# Patient Record
Sex: Female | Born: 1980
Health system: Southern US, Community
[De-identification: ages and names within clinical notes are randomized; demographics above are authoritative.]

## PROBLEM LIST (undated history)

## (undated) DIAGNOSIS — G4700 Insomnia, unspecified: Secondary | ICD-10-CM

## (undated) DIAGNOSIS — R55 Syncope and collapse: Secondary | ICD-10-CM

## (undated) DIAGNOSIS — G43709 Chronic migraine without aura, not intractable, without status migrainosus: Secondary | ICD-10-CM

## (undated) DIAGNOSIS — K649 Unspecified hemorrhoids: Secondary | ICD-10-CM

## (undated) DIAGNOSIS — R519 Headache, unspecified: Secondary | ICD-10-CM

## (undated) DIAGNOSIS — Z87410 Personal history of cervical dysplasia: Secondary | ICD-10-CM

## (undated) DIAGNOSIS — G4730 Sleep apnea, unspecified: Secondary | ICD-10-CM

## (undated) DIAGNOSIS — R000 Tachycardia, unspecified: Secondary | ICD-10-CM

## (undated) DIAGNOSIS — I493 Ventricular premature depolarization: Secondary | ICD-10-CM

## (undated) DIAGNOSIS — F411 Generalized anxiety disorder: Secondary | ICD-10-CM

## (undated) DIAGNOSIS — G4731 Primary central sleep apnea: Secondary | ICD-10-CM

## (undated) DIAGNOSIS — M1990 Unspecified osteoarthritis, unspecified site: Secondary | ICD-10-CM

## (undated) DIAGNOSIS — F419 Anxiety disorder, unspecified: Secondary | ICD-10-CM

## (undated) DIAGNOSIS — K219 Gastro-esophageal reflux disease without esophagitis: Secondary | ICD-10-CM

## (undated) DIAGNOSIS — H5500 Unspecified nystagmus: Secondary | ICD-10-CM

## (undated) DIAGNOSIS — I491 Atrial premature depolarization: Secondary | ICD-10-CM

## (undated) DIAGNOSIS — G35 Multiple sclerosis: Secondary | ICD-10-CM

## (undated) DIAGNOSIS — G709 Myoneural disorder, unspecified: Secondary | ICD-10-CM

## (undated) DIAGNOSIS — R51 Headache: Secondary | ICD-10-CM

## (undated) HISTORY — DX: Multiple sclerosis: G35

## (undated) HISTORY — DX: Myoneural disorder, unspecified: G70.9

## (undated) HISTORY — DX: Headache, unspecified: R51.9

## (undated) HISTORY — PX: NO PAST SURGERIES: SHX2092

## (undated) HISTORY — DX: Unspecified nystagmus: H55.00

## (undated) HISTORY — DX: Headache: R51

## (undated) HISTORY — DX: Insomnia, unspecified: G47.00

## (undated) HISTORY — DX: Syncope and collapse: R55

---

## 2001-11-26 ENCOUNTER — Ambulatory Visit (HOSPITAL_COMMUNITY): Admission: RE | Admit: 2001-11-26 | Discharge: 2001-11-26 | Payer: Self-pay | Admitting: Pediatrics

## 2001-11-26 ENCOUNTER — Encounter: Payer: Self-pay | Admitting: General Surgery

## 2004-10-14 ENCOUNTER — Emergency Department (HOSPITAL_COMMUNITY): Admission: EM | Admit: 2004-10-14 | Discharge: 2004-10-14 | Payer: Self-pay | Admitting: Emergency Medicine

## 2005-01-03 ENCOUNTER — Emergency Department (HOSPITAL_COMMUNITY): Admission: EM | Admit: 2005-01-03 | Discharge: 2005-01-03 | Payer: Self-pay | Admitting: Emergency Medicine

## 2006-06-08 ENCOUNTER — Emergency Department (HOSPITAL_COMMUNITY): Admission: EM | Admit: 2006-06-08 | Discharge: 2006-06-08 | Payer: Self-pay | Admitting: Emergency Medicine

## 2006-06-09 ENCOUNTER — Ambulatory Visit (HOSPITAL_COMMUNITY): Admission: RE | Admit: 2006-06-09 | Discharge: 2006-06-09 | Payer: Self-pay | Admitting: Emergency Medicine

## 2007-04-20 ENCOUNTER — Emergency Department (HOSPITAL_COMMUNITY): Admission: EM | Admit: 2007-04-20 | Discharge: 2007-04-20 | Payer: Self-pay | Admitting: Emergency Medicine

## 2008-01-23 ENCOUNTER — Emergency Department (HOSPITAL_COMMUNITY): Admission: EM | Admit: 2008-01-23 | Discharge: 2008-01-23 | Payer: Self-pay | Admitting: Emergency Medicine

## 2008-09-13 ENCOUNTER — Emergency Department (HOSPITAL_COMMUNITY): Admission: EM | Admit: 2008-09-13 | Discharge: 2008-09-13 | Payer: Self-pay | Admitting: Emergency Medicine

## 2008-12-06 ENCOUNTER — Emergency Department (HOSPITAL_COMMUNITY): Admission: EM | Admit: 2008-12-06 | Discharge: 2008-12-06 | Payer: Self-pay | Admitting: Emergency Medicine

## 2011-07-24 LAB — CBC
HCT: 41.4
Hemoglobin: 14.8
MCHC: 35.7
RBC: 4.45
RDW: 12.5

## 2011-07-24 LAB — COMPREHENSIVE METABOLIC PANEL
ALT: 12
Alkaline Phosphatase: 55
BUN: 11
CO2: 27
Calcium: 9
GFR calc non Af Amer: >60
Glucose, Bld: 103 — ABNORMAL HIGH
Sodium: 137

## 2011-07-24 LAB — PREGNANCY, URINE: Preg Test, Ur: NEGATIVE

## 2011-07-24 LAB — DIFFERENTIAL
Basophils Relative: 1
Eosinophils Absolute: 0.1
Lymphs Abs: 2.8
Neutro Abs: 5.3
Neutrophils Relative %: 59

## 2011-08-16 LAB — DIFFERENTIAL
Lymphocytes Relative: 38
Lymphs Abs: 2.7
Monocytes Relative: 8
Neutrophils Relative %: 52

## 2011-08-16 LAB — CBC
Platelets: 322
RBC: 4.33
WBC: 7

## 2011-08-16 LAB — D-DIMER, QUANTITATIVE: D-Dimer, Quant: 0.26

## 2013-01-24 ENCOUNTER — Encounter (HOSPITAL_COMMUNITY): Payer: Self-pay | Admitting: *Deleted

## 2013-01-24 ENCOUNTER — Emergency Department (HOSPITAL_COMMUNITY)
Admission: EM | Admit: 2013-01-24 | Discharge: 2013-01-24 | Disposition: A | Discharge status: Home Health | Payer: Self-pay | Attending: Emergency Medicine | Admitting: Emergency Medicine

## 2013-01-24 DIAGNOSIS — R1032 Left lower quadrant pain: Secondary | ICD-10-CM | POA: Insufficient documentation

## 2013-01-24 DIAGNOSIS — Z3202 Encounter for pregnancy test, result negative: Secondary | ICD-10-CM | POA: Insufficient documentation

## 2013-01-24 DIAGNOSIS — R109 Unspecified abdominal pain: Secondary | ICD-10-CM

## 2013-01-24 DIAGNOSIS — F17200 Nicotine dependence, unspecified, uncomplicated: Secondary | ICD-10-CM | POA: Insufficient documentation

## 2013-01-24 LAB — URINALYSIS, ROUTINE W REFLEX MICROSCOPIC
Leukocytes, UA: NEGATIVE
Nitrite: NEGATIVE
Specific Gravity, Urine: <1.005 — ABNORMAL LOW (ref 1.005–1.030)
pH: 6 (ref 5.0–8.0)

## 2013-01-24 LAB — CBC
MCH: 32.9 pg (ref 26.0–34.0)
Platelets: 276 10*3/uL (ref 150–400)
RBC: 4.65 MIL/uL (ref 3.87–5.11)
WBC: 7.4 10*3/uL (ref 4.0–10.5)

## 2013-01-24 LAB — COMPREHENSIVE METABOLIC PANEL
ALT: 17 U/L (ref 0–35)
AST: 13 U/L (ref 0–37)
CO2: 24 mEq/L (ref 19–32)
Calcium: 9.4 mg/dL (ref 8.4–10.5)
Chloride: 103 mEq/L (ref 96–112)
GFR calc non Af Amer: 76 mL/min — ABNORMAL LOW (ref >90)
Potassium: 4.6 mEq/L (ref 3.5–5.1)
Sodium: 136 mEq/L (ref 135–145)

## 2013-01-24 LAB — POCT PREGNANCY, URINE: Preg Test, Ur: NEGATIVE

## 2013-01-24 MED ORDER — HYDROCODONE-ACETAMINOPHEN 5-500 MG PO TABS: 1.0000 | ORAL_TABLET | Freq: Four times a day (QID) | ORAL | Status: DC | PRN

## 2013-01-24 MED ORDER — HYDROCODONE-ACETAMINOPHEN 5-325 MG PO TABS
1.0000 | ORAL_TABLET | Freq: Once | ORAL | Status: AC
  Administered 2013-01-24: 1 via ORAL
  Filled 2013-01-24: qty 1

## 2013-01-24 NOTE — ED Notes (Signed)
Nausea, light headed for 1 week, Pain LLQ since yesterday, Has taken 2 neg preg tests at home.

## 2013-01-24 NOTE — ED Provider Notes (Signed)
History     CSN: [id number]  Arrival date & time 01/24/13  1306   First MD Initiated Contact with Patient 01/24/13 1333      Chief Complaint  Patient presents with  . Abdominal Pain    (Consider location/radiation/quality/duration/timing/severity/associated sxs/prior treatment) Patient is a 32 y.o. female presenting with abdominal pain. The history is provided by the patient.  Abdominal Pain Associated symptoms: no chest pain, no constipation, no cough, no diarrhea, no dysuria, no fever, no shortness of breath, no vaginal bleeding, no vaginal discharge and no vomiting   pt c/o llq pain in past day, crampy, dull, mild LLQ, non radiating, no specific exacerbating or alleviating factors. Had normal bm today. No nvd. Normal appetite. No fever or chills. No dysuria or gu c/o. No vaginal discharge or bleeding. lnmp 1 month ago. States took 2 preg tests at home neg. No hx endometriosis, ovarian cysts, or uterine fibroids. No prior abd surgery. No abd wall strain. No fever or chills. No back or flank pain.    History reviewed. No pertinent past medical history.  History reviewed. No pertinent past surgical history.  History reviewed. No pertinent family history.  History  Substance Use Topics  . Smoking status: Current Every Day Smoker  . Smokeless tobacco: Not on file  . Alcohol Use: Yes     Comment: rarely    OB History   Grav Para Term Preterm Abortions TAB SAB Ect Mult Living                  Review of Systems  Constitutional: Negative for fever.  HENT: Negative for neck pain.   Eyes: Negative for redness.  Respiratory: Negative for cough and shortness of breath.   Cardiovascular: Negative for chest pain.  Gastrointestinal: Positive for abdominal pain. Negative for vomiting, diarrhea and constipation.  Genitourinary: Negative for dysuria, flank pain, vaginal bleeding and vaginal discharge.  Musculoskeletal: Negative for back pain.  Skin: Negative for rash.   Neurological: Negative for headaches.  Hematological: Does not bruise/bleed easily.  Psychiatric/Behavioral: Negative for confusion.    Allergies  Review of patient's allergies indicates not on file.  Home Medications  No current outpatient prescriptions on file.  BP 128/76  Pulse 86  Temp(Src) 98.6 F (37 C) (Oral)  Resp 20  Ht 5\' 4"  (1.626 m)  Wt 206 lb (93.441 kg)  BMI 35.34 kg/m2  SpO2 98%  LMP 11/29/2012  Physical Exam  Nursing note and vitals reviewed. Constitutional: She appears well-developed and well-nourished. No distress.  HENT:  Mouth/Throat: Oropharynx is clear and moist.  Eyes: Conjunctivae are normal. No scleral icterus.  Neck: Neck supple. No tracheal deviation present.  Cardiovascular: Normal rate, regular rhythm, normal heart sounds and intact distal pulses.   Pulmonary/Chest: Effort normal and breath sounds normal. No respiratory distress.  Abdominal: Soft. Normal appearance and bowel sounds are normal. She exhibits no distension and no mass. There is no tenderness. There is no rebound and no guarding.  No abd/pelvic tenderness w light or deep palpation.   Genitourinary:  No cva tenderness  Musculoskeletal: She exhibits no edema.  Neurological: She is alert.  Skin: Skin is warm and dry. No rash noted.  Psychiatric: She has a normal mood and affect.    ED Course  Procedures (including critical care time)   Results for orders placed during the hospital encounter of 01/24/13  URINALYSIS, ROUTINE W REFLEX MICROSCOPIC      Result Value Range   Color, Urine YELLOW  YELLOW  APPearance CLEAR  CLEAR   Specific Gravity, Urine <1.005 (*) 1.005 - 1.030   pH 6.0  5.0 - 8.0   Glucose, UA NEGATIVE  NEGATIVE mg/dL   Hgb urine dipstick NEGATIVE  NEGATIVE   Bilirubin Urine NEGATIVE  NEGATIVE   Ketones, ur NEGATIVE  NEGATIVE mg/dL   Protein, ur NEGATIVE  NEGATIVE mg/dL   Urobilinogen, UA 0.2  0.0 - 1.0 mg/dL   Nitrite NEGATIVE  NEGATIVE   Leukocytes, UA  NEGATIVE  NEGATIVE  CBC      Result Value Range   WBC 7.4  4.0 - 10.5 K/uL   RBC 4.65  3.87 - 5.11 MIL/uL   Hemoglobin 15.3 (*) 12.0 - 15.0 g/dL   HCT 11.9  14.7 - 82.9 %   MCV 93.5  78.0 - 100.0 fL   MCH 32.9  26.0 - 34.0 pg   MCHC 35.2  30.0 - 36.0 g/dL   RDW 56.2  13.0 - 86.5 %   Platelets 276  150 - 400 K/uL  COMPREHENSIVE METABOLIC PANEL      Result Value Range   Sodium 136  135 - 145 mEq/L   Potassium 4.6  3.5 - 5.1 mEq/L   Chloride 103  96 - 112 mEq/L   CO2 24  19 - 32 mEq/L   Glucose, Bld 93  70 - 99 mg/dL   BUN 14  6 - 23 mg/dL   Creatinine, Ser 7.84  0.50 - 1.10 mg/dL   Calcium 9.4  8.4 - 69.6 mg/dL   Total Protein 7.3  6.0 - 8.3 g/dL   Albumin 4.0  3.5 - 5.2 g/dL   AST 13  0 - 37 U/L   ALT 17  0 - 35 U/L   Alkaline Phosphatase 88  39 - 117 U/L   Total Bilirubin 0.2 (*) 0.3 - 1.2 mg/dL   GFR calc non Af Amer 76 (*) >90 mL/min   GFR calc Af Amer 89 (*) >90 mL/min  POCT PREGNANCY, URINE      Result Value Range   Preg Test, Ur NEGATIVE  NEGATIVE       MDM  Labs.   No meds pta, pt has ride, does not have to drive.  vicodin 1 po.  Reviewed nursing notes and prior charts for additional history.   Recheck pt comfortable. No abd or pelvic tenderness on exam.  Pt appears stable for d/c.         Suzi Roots, MD 01/24/13 1459

## 2013-01-24 NOTE — Discharge Instructions (Signed)
Take motrin or aleve as need for pain. You may also take hydrocodone as need for pain. No driving when taking hydrocodone. Also, do not take tylenol or acetaminophen containing medication when taking hydrocodone. Follow up with primary care doctor in the next 1-2 days for recheck if symptoms fail to improve/resolve. Return to ER right away if worse, worsening pain, vomiting, fevers, other concern.      Abdominal Pain Abdominal pain can be caused by many things. Your caregiver decides the seriousness of your pain by an examination and possibly blood tests and X-rays. Many cases can be observed and treated at home. Most abdominal pain is not caused by a disease and will probably improve without treatment. However, in many cases, more time must pass before a clear cause of the pain can be found. Before that point, it may not be known if you need more testing, or if hospitalization or surgery is needed. HOME CARE INSTRUCTIONS   Do not take laxatives unless directed by your caregiver.  Take pain medicine only as directed by your caregiver.  Only take over-the-counter or prescription medicines for pain, discomfort, or fever as directed by your caregiver.  Try a clear liquid diet (broth, tea, or water) for as long as directed by your caregiver. Slowly move to a bland diet as tolerated. SEEK IMMEDIATE MEDICAL CARE IF:   The pain does not go away.  You have a fever.  You keep throwing up (vomiting).  The pain is felt only in portions of the abdomen. Pain in the right side could possibly be appendicitis. In an adult, pain in the left lower portion of the abdomen could be colitis or diverticulitis.  You pass bloody or black tarry stools. MAKE SURE YOU:   Understand these instructions.  Will watch your condition.  Will get help right away if you are not doing well or get worse. Document Released: 07/26/2005 Document Revised: 01/08/2012 Document Reviewed: 06/03/2008 [facility] Patient  Information 2013 [place], [place].

## 2015-10-25 ENCOUNTER — Emergency Department (HOSPITAL_COMMUNITY)
Admission: EM | Admit: 2015-10-25 | Discharge: 2015-10-25 | Disposition: A | Discharge status: Home / Self Care | Payer: Managed Care, Other (non HMO) | Attending: Emergency Medicine | Admitting: Emergency Medicine

## 2015-10-25 ENCOUNTER — Emergency Department (HOSPITAL_COMMUNITY): Payer: Managed Care, Other (non HMO)

## 2015-10-25 ENCOUNTER — Encounter (HOSPITAL_COMMUNITY): Payer: Self-pay | Admitting: *Deleted

## 2015-10-25 DIAGNOSIS — M545 Low back pain: Secondary | ICD-10-CM

## 2015-10-25 DIAGNOSIS — Z3202 Encounter for pregnancy test, result negative: Secondary | ICD-10-CM | POA: Insufficient documentation

## 2015-10-25 DIAGNOSIS — F17200 Nicotine dependence, unspecified, uncomplicated: Secondary | ICD-10-CM | POA: Insufficient documentation

## 2015-10-25 LAB — POC URINE PREG, ED: Preg Test, Ur: NEGATIVE

## 2015-10-25 MED ORDER — METHOCARBAMOL 500 MG PO TABS: 1000.0000 mg | ORAL_TABLET | Freq: Four times a day (QID) | ORAL | Status: AC

## 2015-10-25 MED ORDER — HYDROCODONE-ACETAMINOPHEN 5-325 MG PO TABS
1.0000 | ORAL_TABLET | Freq: Once | ORAL | Status: AC
  Administered 2015-10-25: 1 via ORAL
  Filled 2015-10-25: qty 1

## 2015-10-25 MED ORDER — HYDROCODONE-ACETAMINOPHEN 5-325 MG PO TABS: 1.0000 | ORAL_TABLET | ORAL | Status: DC | PRN

## 2015-10-25 NOTE — ED Provider Notes (Signed)
CSN: [id number]     Arrival date & time 10/25/15  1508 History  By signing my name below, I, [facility], attest that this documentation has been prepared under the direction and in the presence of Burgess Amor, PA-C. Electronically Signed: Randell Patient, ED Scribe. 10/25/2015. 4:31 PM.   Chief Complaint  Patient presents with  . Back Pain   The history is provided by the patient. No language interpreter was used.   HPI Comments: Virginia [name] is a 34 y.o. female with no pertinent PMHx who presents to the Emergency Department complaining of sudden onset, mild, sharp, lower back pain, worse on left, that radiates to the bilateral buttocks 3 days ago. She reports that she was squatting down to pick up something off the floor, followed by pain and was unable to stand up. Pain is worse with movement. She has taken Aleve and ibuprofen and tried applying heat and ice with temporary relief with the ice only. Patient denies hx of similar symptoms. She denies lifting heavy objects. She reports a family hx of back and neck problems. Per patient, she has a sedentary job. Patient denies numbness, weakness, bowel and bladder incontinence, hematuria, dysuria, abdominal pain, fever, and nausea. LMP today.  PCP: Dr. Blair Heys  History reviewed. No pertinent past medical history. History reviewed. No pertinent past surgical history. No family history on file. Social History  Substance Use Topics  . Smoking status: Current Every Day Smoker  . Smokeless tobacco: None  . Alcohol Use: Yes     Comment: rarely   OB History    No data available     Review of Systems  Constitutional: Negative for fever.  Gastrointestinal: Negative for nausea and abdominal pain.  Genitourinary: Negative for dysuria and hematuria.  Musculoskeletal: Positive for back pain (lower).  Neurological: Negative for weakness and numbness.      Allergies  Codeine  Home Medications   Prior to Admission  medications   Medication Sig Start Date End Date Taking? Authorizing Provider  ibuprofen (ADVIL,MOTRIN) 200 MG tablet Take 200 mg by mouth every 6 (six) hours as needed for mild pain or moderate pain.   Yes Historical Provider, MD  Multiple Vitamins-Minerals (EMERGEN-C IMMUNE PO) Take 1 packet by mouth daily as needed.   Yes Historical Provider, MD  HYDROcodone-acetaminophen (NORCO/VICODIN) 5-325 MG tablet Take 1 tablet by mouth every 4 (four) hours as needed. 10/25/15   Burgess Amor, PA-C  methocarbamol (ROBAXIN) 500 MG tablet Take 2 tablets (1,000 mg total) by mouth 4 (four) times daily. 10/25/15 11/04/15  Burgess Amor, PA-C   BP 102/62 mmHg  Pulse 86  Temp(Src) 98.4 F (36.9 C) (Oral)  Resp 16  Ht  (1.626 m)  Wt 92.987 kg  BMI 35.17 kg/m2  SpO2 99%  LMP 10/20/2015 Physical Exam  Constitutional: She appears well-developed and well-nourished.  HENT:  Head: Normocephalic.  Eyes: Conjunctivae are normal.  Neck: Normal range of motion. Neck supple.  Cardiovascular: Normal rate and intact distal pulses.   Pedal pulses normal.  Pulmonary/Chest: Effort normal.  Abdominal: Soft. Bowel sounds are normal. She exhibits no distension and no mass.  Musculoskeletal: Normal range of motion. She exhibits no edema.       Lumbar back: She exhibits tenderness. She exhibits no swelling, no edema and no spasm.  Neurological: She is alert. She has normal strength. She displays no atrophy and no tremor. No sensory deficit. Gait normal.  Reflex Scores:      Patellar reflexes are  2+ on the right side and 2+ on the left side.      Achilles reflexes are 2+ on the right side and 2+ on the left side. No strength deficit noted in hip and knee flexor and extensor muscle groups.  Ankle flexion and extension intact.  Skin: Skin is warm and dry.  Psychiatric: She has a normal mood and affect.  Nursing note and vitals reviewed.   ED Course  Procedures   DIAGNOSTIC STUDIES: Oxygen Saturation is 99% on RA,  normal by my interpretation.    COORDINATION OF CARE: 4:16 PM Will order back x-ray. Will prescribe muscle relaxers and pain medication. Discussed treatment plan with pt at bedside and pt agreed to plan.  Labs Review Labs Reviewed  POC URINE PREG, ED    Imaging Review Dg Lumbar Spine Complete  10/25/2015  CLINICAL DATA:  Severe low back pain after squatting. EXAM: LUMBAR SPINE - COMPLETE 4+ VIEW COMPARISON:  01/23/2008 FINDINGS: There is no evidence of lumbar spine fracture. Alignment is normal. Intervertebral disc spaces are maintained. IMPRESSION: Negative. Electronically Signed   By: Ted Mcalpine M.D.   On: 10/25/2015 17:29   I have personally reviewed and evaluated these images and lab results as part of my medical decision-making.   EKG Interpretation None      MDM   Final diagnoses:  Low back pain without sciatica, unspecified back pain laterality    No neuro deficit on exam or by history to suggest emergent or surgical presentation.  Also discussed worsened sx that should prompt immediate re-evaluation including distal weakness, bowel/bladder retention/incontinence. Suspect inflammation/muscle strain as source of sx. Continue ibu, pt prescribed hydrocodone, robaxin, advised heat tx.  Prn f/u if sx not improved or for any worsened sx over the next week.    I personally performed the services described in this documentation, which was scribed in my presence. The recorded information has been reviewed and is accurate.       Burgess Amor, PA-C 10/26/15 1219  Zadie Rhine, MD 10/26/15 1520

## 2015-10-25 NOTE — ED Notes (Signed)
Pt verbalized understanding of no driving and to use caution within 4 hours of taking pain meds due to meds cause drowsiness 

## 2015-10-25 NOTE — Discharge Instructions (Signed)
Back Pain, Adult °Back pain is very common in adults. The cause of back pain is rarely dangerous and the pain often gets better over time. The cause of your back pain may not be known. Some common causes of back pain include: °· Strain of the muscles or ligaments supporting the spine. °· Wear and tear (degeneration) of the spinal disks. °· Arthritis. °· Direct injury to the back. °For many people, back pain may return. Since back pain is rarely dangerous, most people can learn to manage this condition on their own. °HOME CARE INSTRUCTIONS °Watch your back pain for any changes. The following actions may help to lessen any discomfort you are feeling: °· Remain active. It is stressful on your back to sit or stand in one place for long periods of time. Do not sit, drive, or stand in one place for more than 30 minutes at a time. Take short walks on even surfaces as soon as you are able. Try to increase the length of time you walk each day. °· Exercise regularly as directed by your health care provider. Exercise helps your back heal faster. It also helps avoid future injury by keeping your muscles strong and flexible. °· Do not stay in bed. Resting more than 1-2 days can delay your recovery. °· Pay attention to your body when you bend and lift. The most comfortable positions are those that put less stress on your recovering back. Always use proper lifting techniques, including: °¨ Bending your knees. °¨ Keeping the load close to your body. °¨ Avoiding twisting. °· Find a comfortable position to sleep. Use a firm mattress and lie on your side with your knees slightly bent. If you lie on your back, put a pillow under your knees. °· Avoid feeling anxious or stressed. Stress increases muscle tension and can worsen back pain. It is important to recognize when you are anxious or stressed and learn ways to manage it, such as with exercise. °· Take medicines only as directed by your health care provider. Over-the-counter  medicines to reduce pain and inflammation are often the most helpful. Your health care provider may prescribe muscle relaxant drugs. These medicines help dull your pain so you can more quickly return to your normal activities and healthy exercise. °· Apply ice to the injured area: °¨ Put ice in a plastic bag. °¨ Place a towel between your skin and the bag. °¨ Leave the ice on for 20 minutes, 2-3 times a day for the first 2-3 days. After that, ice and heat may be alternated to reduce pain and spasms. °· Maintain a healthy weight. Excess weight puts extra stress on your back and makes it difficult to maintain good posture. °SEEK MEDICAL CARE IF: °· You have pain that is not relieved with rest or medicine. °· You have increasing pain going down into the legs or buttocks. °· You have pain that does not improve in one week. °· You have night pain. °· You lose weight. °· You have a fever or chills. °SEEK IMMEDIATE MEDICAL CARE IF:  °· You develop new bowel or bladder control problems. °· You have unusual weakness or numbness in your arms or legs. °· You develop nausea or vomiting. °· You develop abdominal pain. °· You feel faint. °  °This information is not intended to replace advice given to you by your health care provider. Make sure you discuss any questions you have with your health care provider. °  °Document Released: 10/16/2005 Document Revised: 11/06/2014 Document Reviewed: 02/17/2014 °Elsevier Interactive Patient Education ©2016 [employer] ° ° ° ° Do not drive within 4 hours of taking hydrocodone as this will make you drowsy.  Avoid lifting,  Bending,  Twisting or any other activity that worsens your pain over the next week.  Apply a heating pad  to your lower back for 10-15 minutes every 2 hours for the next 2 days.  You should get rechecked if your symptoms are not better over the next 5 days,  Or you develop increased pain,  Weakness in your leg(s) or loss of bladder or bowel function - these are symptoms  of a worse injury. ° ° °

## 2015-10-25 NOTE — ED Notes (Signed)
Pt comes in for lower back starting last Friday. Pt denies any injury. NAD noted.

## 2016-02-24 ENCOUNTER — Encounter (HOSPITAL_COMMUNITY): Payer: Self-pay

## 2016-02-24 ENCOUNTER — Emergency Department (HOSPITAL_COMMUNITY)
Admission: EM | Admit: 2016-02-24 | Discharge: 2016-02-24 | Disposition: A | Discharge status: Home / Self Care | Payer: Managed Care, Other (non HMO) | Attending: Emergency Medicine | Admitting: Emergency Medicine

## 2016-02-24 DIAGNOSIS — Z79899 Other long term (current) drug therapy: Secondary | ICD-10-CM | POA: Insufficient documentation

## 2016-02-24 DIAGNOSIS — R002 Palpitations: Secondary | ICD-10-CM | POA: Diagnosis not present

## 2016-02-24 DIAGNOSIS — F17200 Nicotine dependence, unspecified, uncomplicated: Secondary | ICD-10-CM | POA: Diagnosis not present

## 2016-02-24 DIAGNOSIS — R251 Tremor, unspecified: Secondary | ICD-10-CM | POA: Diagnosis present

## 2016-02-24 LAB — COMPREHENSIVE METABOLIC PANEL
ALBUMIN: 3.8 g/dL (ref 3.5–5.0)
ALK PHOS: 61 U/L (ref 38–126)
ALT: 13 U/L — AB (ref 14–54)
AST: 12 U/L — AB (ref 15–41)
Anion gap: 8 (ref 5–15)
BUN: 12 mg/dL (ref 6–20)
CALCIUM: 8.7 mg/dL — AB (ref 8.9–10.3)
CO2: 21 mmol/L — AB (ref 22–32)
CREATININE: 0.96 mg/dL (ref 0.44–1.00)
Chloride: 110 mmol/L (ref 101–111)
GFR calc Af Amer: >60 mL/min (ref >60)
GFR calc non Af Amer: >60 mL/min (ref >60)
GLUCOSE: 100 mg/dL — AB (ref 65–99)
Potassium: 3.7 mmol/L (ref 3.5–5.1)
SODIUM: 139 mmol/L (ref 135–145)
Total Bilirubin: 0.4 mg/dL (ref 0.3–1.2)
Total Protein: 6.7 g/dL (ref 6.5–8.1)

## 2016-02-24 LAB — URINE MICROSCOPIC-ADD ON
Bacteria, UA: NONE SEEN
Squamous Epithelial / LPF: NONE SEEN
WBC, UA: NONE SEEN WBC/hpf (ref 0–5)

## 2016-02-24 LAB — CBG MONITORING, ED: Glucose-Capillary: 79 mg/dL (ref 65–99)

## 2016-02-24 LAB — CBC WITH DIFFERENTIAL/PLATELET
BASOS PCT: 0 %
Basophils Absolute: 0 10*3/uL (ref 0.0–0.1)
EOS ABS: 0.2 10*3/uL (ref 0.0–0.7)
Eosinophils Relative: 2 %
HCT: 40.2 % (ref 36.0–46.0)
HEMOGLOBIN: 13.8 g/dL (ref 12.0–15.0)
LYMPHS ABS: 2.1 10*3/uL (ref 0.7–4.0)
Lymphocytes Relative: 21 %
MCH: 31.9 pg (ref 26.0–34.0)
MCHC: 34.3 g/dL (ref 30.0–36.0)
MCV: 93.1 fL (ref 78.0–100.0)
Monocytes Absolute: 0.7 10*3/uL (ref 0.1–1.0)
Monocytes Relative: 7 %
NEUTROS PCT: 70 %
Neutro Abs: 6.8 10*3/uL (ref 1.7–7.7)
Platelets: 283 10*3/uL (ref 150–400)
RBC: 4.32 MIL/uL (ref 3.87–5.11)
RDW: 12.1 % (ref 11.5–15.5)
WBC: 9.8 10*3/uL (ref 4.0–10.5)

## 2016-02-24 LAB — URINALYSIS, ROUTINE W REFLEX MICROSCOPIC
Bilirubin Urine: NEGATIVE
GLUCOSE, UA: NEGATIVE mg/dL
KETONES UR: 15 mg/dL — AB
Leukocytes, UA: NEGATIVE
Nitrite: NEGATIVE
PROTEIN: NEGATIVE mg/dL
Specific Gravity, Urine: 1.01 (ref 1.005–1.030)
pH: 6 (ref 5.0–8.0)

## 2016-02-24 LAB — TSH: TSH: 0.635 u[IU]/mL (ref 0.350–4.500)

## 2016-02-24 LAB — PREGNANCY, URINE: PREG TEST UR: NEGATIVE

## 2016-02-24 NOTE — Discharge Instructions (Signed)
Your initial glucose was slightly low. This may have some significance. Eat regular meals which include protein. Will need to get a primary care doctor for follow-up.

## 2016-02-24 NOTE — ED Provider Notes (Signed)
CSN: [id number]     Arrival date & time 02/24/16  1031 History  By signing my name below, I, Ronney Lion, attest that this documentation has been prepared under the direction and in the presence of Donnetta Hutching, MD. Electronically Signed: Ronney Lion, ED Scribe. 02/24/2016. 11:19 AM.   Chief Complaint  Patient presents with  . Tremors   The history is provided by the patient. No language interpreter was used.    HPI Comments: Virginia [name] is a 35 y.o. female who presents to the Emergency Department complaining of intermittent episodes of blurred vision, heart palpitations, and feeling near-syncopal, lasting 1-5 minutes at a time, which began 1 month ago. She states when she first had an episode, the second episode didn't occur until several days later, but recently she has been having daily episodes. She states she felt shaky when she woke up this morning. She states had taken Tylenol with minimal relief. Patient denies a history of any known chronic medical conditions. She denies any known triggers, changes in caffeine consumption, or extraordinary stress. She denies illicit drug use, EtOH consumption, or smoking. Patient states she has been eating normally. She lives at home with her husband and son.   PCP: None  History reviewed. No pertinent past medical history. History reviewed. No pertinent past surgical history. No family history on file. Social History  Substance Use Topics  . Smoking status: Current Every Day Smoker  . Smokeless tobacco: None  . Alcohol Use: Yes     Comment: rarely   OB History    No data available     Review of Systems  Eyes: Positive for visual disturbance (blurred vision).  Cardiovascular: Positive for palpitations.  Neurological: Positive for tremors and light-headedness.  All other systems reviewed and are negative.     Allergies  Codeine  Home Medications   Prior to Admission medications   Medication Sig Start Date End Date Taking? Authorizing  Provider  acetaminophen (TYLENOL) 500 MG tablet Take 1,000 mg by mouth every 6 (six) hours as needed.   Yes Historical Provider, MD   BP 111/73 mmHg  Pulse 70  Temp(Src) 98.3 F (36.8 C) (Oral)  Resp 13  Ht 5\' 4"  (1.626 m)  Wt 180 lb (81.647 kg)  BMI 30.88 kg/m2  SpO2 98%  LMP 02/23/2016 Physical Exam  Constitutional: She is oriented to person, place, and time. She appears well-developed and well-nourished.  HENT:  Head: Normocephalic and atraumatic.  Eyes: Conjunctivae and EOM are normal. Pupils are equal, round, and reactive to light.  Neck: Normal range of motion. Neck supple.  Cardiovascular: Normal rate, regular rhythm and normal heart sounds.   Pulmonary/Chest: Effort normal and breath sounds normal.  Abdominal: Soft. Bowel sounds are normal.  Musculoskeletal: Normal range of motion.  Neurological: She is alert and oriented to person, place, and time.  Skin: Skin is warm and dry.  Psychiatric: She has a normal mood and affect. Her behavior is normal.  Nursing note and vitals reviewed.   ED Course  Procedures (including critical care time)  DIAGNOSTIC STUDIES: Oxygen Saturation is 100% on RA, normal by my interpretation.    COORDINATION OF CARE: 11:14 AM - Discussed treatment plan with pt at bedside which includes blood tests and EKG. Pt verbalized understanding and agreed to plan.   Labs Review Labs Reviewed  COMPREHENSIVE METABOLIC PANEL - Abnormal; Notable for the following:    CO2 21 (*)    Glucose, Bld 100 (*)    Calcium 8.7 (*)  AST 12 (*)    ALT 13 (*)    All other components within normal limits  URINALYSIS, ROUTINE W REFLEX MICROSCOPIC (NOT AT [facility]) - Abnormal; Notable for the following:    Hgb urine dipstick MODERATE (*)    Ketones, ur 15 (*)    All other components within normal limits  CBC WITH DIFFERENTIAL/PLATELET  PREGNANCY, URINE  TSH  URINE MICROSCOPIC-ADD ON  CBG MONITORING, ED    Imaging Review No results found. I have personally  reviewed and evaluated these images and lab results as part of my medical decision-making.   EKG Interpretation   Date/Time:  Thursday February 24 2016 11:32:07 EDT Ventricular Rate:  71 PR Interval:  147 QRS Duration: 94 QT Interval:  378 QTC Calculation: 411 R Axis:   47 Text Interpretation:  Sinus rhythm Baseline wander in lead(s) V1 V3 V4  Confirmed by Justan Gaede  MD, Roosvelt Churchwell ([id number]) on 02/24/2016 11:38:36 AM      MDM   Final diagnoses:  Palpitations   Patient has normal exam. Initial CBG was 79.    Screening labs including CBC, chemistry panel, TSH, pregnancy test all negative. Patient will get primary care follow-up.   I personally performed the services described in this documentation, which was scribed in my presence. The recorded information has been reviewed and is accurate.      Donnetta Hutching, MD 02/24/16 1432

## 2016-02-24 NOTE — ED Notes (Signed)
Pt complain of being shaky, and feeling SOB. States she had an episode like this last month. Pt states she started her period yesterday but she doesn't think it is related.

## 2016-02-28 DIAGNOSIS — Z87898 Personal history of other specified conditions: Secondary | ICD-10-CM

## 2016-02-28 DIAGNOSIS — G35 Multiple sclerosis: Secondary | ICD-10-CM

## 2016-02-28 HISTORY — DX: Multiple sclerosis: G35

## 2016-02-28 HISTORY — DX: Personal history of other specified conditions: Z87.898

## 2016-03-19 ENCOUNTER — Inpatient Hospital Stay (HOSPITAL_COMMUNITY)
Admission: EM | Admit: 2016-03-19 | Discharge: 2016-03-23 | DRG: 059 | Disposition: A | Discharge status: Home / Self Care | Payer: Managed Care, Other (non HMO) | Attending: Internal Medicine | Admitting: Internal Medicine

## 2016-03-19 ENCOUNTER — Encounter (HOSPITAL_COMMUNITY): Payer: Self-pay | Admitting: Emergency Medicine

## 2016-03-19 DIAGNOSIS — E872 Acidosis: Secondary | ICD-10-CM | POA: Diagnosis present

## 2016-03-19 DIAGNOSIS — R42 Dizziness and giddiness: Secondary | ICD-10-CM | POA: Insufficient documentation

## 2016-03-19 DIAGNOSIS — D72829 Elevated white blood cell count, unspecified: Secondary | ICD-10-CM | POA: Diagnosis present

## 2016-03-19 DIAGNOSIS — G35 Multiple sclerosis: Principal | ICD-10-CM | POA: Diagnosis present

## 2016-03-19 DIAGNOSIS — R55 Syncope and collapse: Secondary | ICD-10-CM | POA: Diagnosis not present

## 2016-03-19 DIAGNOSIS — R9082 White matter disease, unspecified: Secondary | ICD-10-CM

## 2016-03-19 DIAGNOSIS — F17200 Nicotine dependence, unspecified, uncomplicated: Secondary | ICD-10-CM | POA: Diagnosis present

## 2016-03-19 DIAGNOSIS — Z885 Allergy status to narcotic agent status: Secondary | ICD-10-CM

## 2016-03-19 DIAGNOSIS — F419 Anxiety disorder, unspecified: Secondary | ICD-10-CM | POA: Diagnosis present

## 2016-03-19 DIAGNOSIS — Z79899 Other long term (current) drug therapy: Secondary | ICD-10-CM

## 2016-03-19 DIAGNOSIS — G35A Relapsing-remitting multiple sclerosis: Secondary | ICD-10-CM | POA: Insufficient documentation

## 2016-03-19 HISTORY — DX: Anxiety disorder, unspecified: F41.9

## 2016-03-19 LAB — CBC WITH DIFFERENTIAL/PLATELET
BASOS PCT: 0 %
Basophils Absolute: 0 10*3/uL (ref 0.0–0.1)
Eosinophils Absolute: 0.1 10*3/uL (ref 0.0–0.7)
Eosinophils Relative: 0 %
HEMATOCRIT: 42.6 % (ref 36.0–46.0)
Hemoglobin: 14.5 g/dL (ref 12.0–15.0)
LYMPHS ABS: 2.1 10*3/uL (ref 0.7–4.0)
LYMPHS PCT: 15 %
MCH: 30.9 pg (ref 26.0–34.0)
MCHC: 34 g/dL (ref 30.0–36.0)
MCV: 90.8 fL (ref 78.0–100.0)
MONO ABS: 0.6 10*3/uL (ref 0.1–1.0)
MONOS PCT: 4 %
NEUTROS ABS: 11.5 10*3/uL — AB (ref 1.7–7.7)
Neutrophils Relative %: 81 %
Platelets: 335 10*3/uL (ref 150–400)
RBC: 4.69 MIL/uL (ref 3.87–5.11)
RDW: 12.1 % (ref 11.5–15.5)
WBC: 14.2 10*3/uL — ABNORMAL HIGH (ref 4.0–10.5)

## 2016-03-19 LAB — I-STAT BETA HCG BLOOD, ED (MC, WL, AP ONLY): I-stat hCG, quantitative: <5 m[IU]/mL (ref <5)

## 2016-03-19 LAB — BASIC METABOLIC PANEL
Anion gap: 9 (ref 5–15)
BUN: 16 mg/dL (ref 6–20)
CALCIUM: 8.9 mg/dL (ref 8.9–10.3)
CO2: 18 mmol/L — AB (ref 22–32)
CREATININE: 0.74 mg/dL (ref 0.44–1.00)
Chloride: 110 mmol/L (ref 101–111)
GFR calc Af Amer: >60 mL/min (ref >60)
GFR calc non Af Amer: >60 mL/min (ref >60)
GLUCOSE: 100 mg/dL — AB (ref 65–99)
Potassium: 3.7 mmol/L (ref 3.5–5.1)
Sodium: 137 mmol/L (ref 135–145)

## 2016-03-19 LAB — I-STAT TROPONIN, ED: TROPONIN I, POC: 0 ng/mL (ref 0.00–0.08)

## 2016-03-19 MED ORDER — SODIUM CHLORIDE 0.9 % IV BOLUS (SEPSIS)
1000.0000 mL | Freq: Once | INTRAVENOUS | Status: AC
  Administered 2016-03-19: 1000 mL via INTRAVENOUS

## 2016-03-19 MED ORDER — MECLIZINE HCL 25 MG PO TABS
25.0000 mg | ORAL_TABLET | Freq: Once | ORAL | Status: AC
  Administered 2016-03-19: 25 mg via ORAL
  Filled 2016-03-19: qty 1

## 2016-03-19 MED ORDER — ONDANSETRON HCL 4 MG/2ML IJ SOLN
4.0000 mg | Freq: Once | INTRAMUSCULAR | Status: AC
  Administered 2016-03-19: 4 mg via INTRAVENOUS
  Filled 2016-03-19: qty 2

## 2016-03-19 NOTE — ED Provider Notes (Signed)
CSN: [id number]     Arrival date & time 03/19/16  1908 History   First MD Initiated Contact with Patient 03/19/16 2235     Chief Complaint  Patient presents with  . Loss of Consciousness  . Dizziness  . Emesis  . Diarrhea  . Fatigue     (Consider location/radiation/quality/duration/timing/severity/associated sxs/prior Treatment) The history is provided by the patient, medical records, the spouse and a parent. No language interpreter was used.     Virginia [name] is a 35 y.o. female  with a hx of anxiety, syncope presents to the Emergency Department complaining of Worsening dizziness for the last several weeks.  Pt reports today was worse in terms of feeling dizzy.  He reports at approximately 4:15 this afternoon she bent over to get laundry out of the basket and when she stood up she ran into a door frame. She reports she attempted to walk down the hall she had a full syncopal episode. She reports that she likely had her head on the carpeted floor but is without reported injury.  She endorses associated 2 episodes of NBNB emesis over the course of the day which she attributes to her dizzy feeling and nausea. She reports the symptoms occur any time she attempts to sit. Patient denies a history of DVT or CVA.  She does report that approximately 3 weeks ago she pulled a tick off of her. She denies fevers or chills, neck stiffness or rash. She reports intermittent headaches over the last several weeks.  Pt reports that she began to have "balance issues" onset 2-3 weeks ago with depth perception problems, running into doors and worsening with head turning.  She reports this has been persistent, not intermittent. She has not sought care for this.  Record review shows the patient was seen on 02/24/2016 for near syncopal episode. Her emergency department workup was unremarkable at that time..    Past Medical History  Diagnosis Date  . Anxiety    History reviewed. No pertinent past surgical  history. No family history on file. Social History  Substance Use Topics  . Smoking status: Current Every Day Smoker  . Smokeless tobacco: None  . Alcohol Use: Yes     Comment: rarely   OB History    No data available     Review of Systems  Constitutional: Positive for fever. Negative for diaphoresis, appetite change, fatigue and unexpected weight change.  HENT: Negative for mouth sores.   Respiratory: Negative for cough, chest tightness, shortness of breath and wheezing.   Cardiovascular: Negative for chest pain.  Gastrointestinal: Positive for nausea and vomiting ( x2). Negative for abdominal pain, diarrhea and constipation.  Endocrine: Negative for polydipsia, polyphagia and polyuria.  Genitourinary: Negative for dysuria, urgency, frequency and hematuria.  Musculoskeletal: Negative for back pain and neck stiffness.  Skin: Negative for rash.  Allergic/Immunologic: Negative for immunocompromised state.  Neurological: Positive for dizziness, syncope and headaches ( intermittent). Negative for light-headedness.  Hematological: Does not bruise/bleed easily.  Psychiatric/Behavioral: Negative for sleep disturbance. The patient is not nervous/anxious.       Allergies  Codeine  Home Medications   Prior to Admission medications   Medication Sig Start Date End Date Taking? Authorizing Provider  clonazePAM (KLONOPIN) 0.5 MG tablet Take 0.25 mg by mouth at bedtime. 02/29/16  Yes Historical Provider, MD  escitalopram (LEXAPRO) 10 MG tablet Take 10 mg by mouth daily. 02/25/16  Yes Historical Provider, MD  naproxen sodium (ANAPROX) 220 MG tablet Take 220 mg by  mouth 2 (two) times daily as needed (for pain).   Yes Historical Provider, MD   BP 107/64 mmHg  Pulse 61  Temp(Src) 98.1 F (36.7 C) (Oral)  Resp 14  SpO2 97%  LMP 02/23/2016 Physical Exam  Constitutional: She is oriented to person, place, and time. She appears well-developed and well-nourished. No distress.  Awake, alert,  nontoxic appearance  HENT:  Head: Normocephalic and atraumatic.  Nose: Nose normal.  Mouth/Throat: Oropharynx is clear and moist. No oropharyngeal exudate.  Eyes: Conjunctivae and EOM are normal. Pupils are equal, round, and reactive to light. No scleral icterus.  No horizontal, vertical or rotational nystagmus  Neck: Normal range of motion. Neck supple.  Full active and passive ROM without pain No midline or paraspinal tenderness No nuchal rigidity or meningeal signs  Cardiovascular: Normal rate, regular rhythm, normal heart sounds and intact distal pulses.   No murmur heard. Pulmonary/Chest: Effort normal and breath sounds normal. No respiratory distress. She has no wheezes. She has no rales.  Equal chest expansion  Abdominal: Soft. Bowel sounds are normal. She exhibits no mass. There is no tenderness. There is no rebound and no guarding.  Genitourinary: Uterus is not deviated, not enlarged, not fixed and not tender. Cervix exhibits no motion tenderness, no discharge and no friability. Right adnexum displays no mass, no tenderness and no fullness. Left adnexum displays no mass, no tenderness and no fullness.  Musculoskeletal: Normal range of motion. She exhibits no edema.  Lymphadenopathy:    She has no cervical adenopathy.  Neurological: She is alert and oriented to person, place, and time. No cranial nerve deficit. She exhibits normal muscle tone. Coordination normal.  Mental Status:  Alert, oriented, thought content appropriate. Speech fluent without evidence of aphasia. Able to follow 2 step commands without difficulty.  Cranial Nerves:  II:  Peripheral visual fields grossly normal, pupils equal, round, reactive to light III,IV, VI: ptosis not present, extra-ocular motions intact bilaterally  V,VII: smile symmetric, facial light touch sensation equal VIII: hearing grossly normal bilaterally  IX,X: midline uvula rise  XI: bilateral shoulder shrug equal and strong XII: midline  tongue extension  Motor:  5/5 in upper and lower extremities bilaterally including strong and equal grip strength and dorsiflexion/plantar flexion Sensory: Pinprick and light touch normal in all extremities.  Gait: not tested as pt becomes very dizzy upon sitting in bed and is unable/unwilling to stand/walk distal pulses palpable throughout   Skin: Skin is warm and dry. No rash noted. She is not diaphoretic. No erythema.  Psychiatric: She has a normal mood and affect. Her behavior is normal. Judgment and thought content normal.  Nursing note and vitals reviewed.   ED Course  Procedures (including critical care time) Labs Review Labs Reviewed  CBC WITH DIFFERENTIAL/PLATELET - Abnormal; Notable for the following:    WBC 14.2 (*)    Neutro Abs 11.5 (*)    All other components within normal limits  BASIC METABOLIC PANEL - Abnormal; Notable for the following:    CO2 18 (*)    Glucose, Bld 100 (*)    All other components within normal limits  URINALYSIS, ROUTINE W REFLEX MICROSCOPIC (NOT AT [facility])  LYME DISEASE DNA BY PCR([facility])  EHRLICHIA ANTIBODY PANEL  ROCKY MTN SPOTTED FVR ABS PNL(IGG+IGM)  I-STAT BETA HCG BLOOD, ED ([facility], [facility], [facility] ONLY)  Rosezena Sensor, ED    Imaging Review Mr Laqueta Jean Wo Contrast  03/20/2016  CLINICAL DATA:  Initial evaluation for acute syncope. EXAM: MRI HEAD  WITHOUT AND WITH CONTRAST TECHNIQUE: Multiplanar, multiecho pulse sequences of the brain and surrounding structures were obtained without and with intravenous contrast. CONTRAST:  15mL MULTIHANCE GADOBENATE DIMEGLUMINE 529 MG/ML IV SOLN COMPARISON:  None. FINDINGS: Cerebral volume normal for patient age. Patchy multi focal T2/FLAIR hyperintense foci present within the periventricular, deep, and subcortical white matter of both cerebral hemispheres. These are predominantly located within the bilateral parietal regions. The most prominent of these is positioned within the periventricular white matter of the  left parietal region and measures approximately 7 mm (series 6, image 16). This lesion demonstrates central T2 cystic signal intensity with surrounding T2/FLAIR edema. Mildly hyperintense diffusion-weighted signal seen about several of these lesions. Many of these lesions enhance following gadolinium administration. Findings are most consistent with demyelinating disease (multiple sclerosis). Postcontrast enhancement is consistent with active demyelination. No lesions involving the posterior fossa or brainstem identified. No evidence for acute infarct. Gray-white matter differentiation maintained. Major intracranial vascular flow voids are preserved. Diminutive vertebrobasilar system with possible right trigeminal artery noted. No acute or chronic intracranial hemorrhage. No mass lesion, midline shift, or mass effect. No hydrocephalus. No extra-axial fluid collection. Major dural sinuses are grossly patent. Incidental note made of a DVA within the right parietal lobe. Craniocervical junction normal. Visualized upper cervical spine unremarkable. No lesions seen within the upper cervical spinal cord. Pituitary gland normal. No acute abnormality about the globes and orbits. Optic nerves grossly unremarkable. Paranasal sinuses are clear. Trace opacity within the right mastoid air cells. Left mastoid air cells are clear. Inner ear structures normal. Diffusely decreased T1 signal intensity within the vertebral body bone marrow, nonspecific, but most commonly related to smoking, anemia, or obesity. No scalp soft tissue abnormality. IMPRESSION: 1. Abnormal patchy T2/FLAIR hyperintense foci involving the periventricular, deep, and subcortical white matter of both cerebral hemispheres, greatest within the parietal regions. Primary differential consideration consists of underlying demyelinating disease (multiple sclerosis). Many of these foci demonstrate postcontrast enhancement, consistent with active demyelination. 2.  Diffusely decreased T1 signal intensity within the visualized bone marrow, nonspecific, but most commonly related to anemia, smoking, obesity, or other underlying lymphoproliferative disorder. Electronically Signed   By: Rise Mu M.D.   On: 03/20/2016 03:31   I have personally reviewed and evaluated these images and lab results as part of my medical decision-making.   EKG Interpretation   Date/Time:  Sunday Mar 19 2016 23:36:33 EDT Ventricular Rate:  62 PR Interval:  150 QRS Duration: 103 QT Interval:  419 QTC Calculation: 425 R Axis:   63 Text Interpretation:  Sinus rhythm Confirmed by HORTON  MD, COURTNEY  ([id number]) on 03/20/2016 12:26:42 AM    MDM   Final diagnoses:  Syncope  Vertigo  MS (multiple sclerosis) (HCC)   Edlin Dignan presents with symptoms of vertigo and 1 episode of syncope today.  Pt with leukocytosis of 14.2.  BMP with mild acidosis, but no anion gap or hyponatremia.  No fever or rash.  Doubt RMSF.  Negative troponin.  Will obtain MRI as I'm concerned about potential central process.  4:17 AM Pt Rises abnormal patchy T2 FLAIR and underlying demyelinating disease consistent with multiple sclerosis.  MRI is consistent with active demyelination. I discussed this with the patient and her husband. I have recommended admission for IV steroids and they're in agreement with the plan.  Patient's questions have been answered.  4:27 AM Discussed with Dr. Roseanne Reno who recommends Valium to assist with her intractable vertigo. She remains unable to get out of bed  or walk at this time. We will proceed with medical admission. Neurology will consult.  5:02 AM Discussed with Dr. Julian Reil who will admit to Medsurg.    Dahlia Client Adalin Vanderploeg, PA-C 03/20/16 3953  Shon Baton, MD 03/20/16 2308

## 2016-03-19 NOTE — ED Notes (Signed)
Pt. reports brief syncopal episode today , dizziness with emesis , diarrhea and fatigue onset 2 weeks ago , pt. added tick bite 3 weeks ago , no fever or chills.

## 2016-03-20 ENCOUNTER — Inpatient Hospital Stay (HOSPITAL_COMMUNITY)
Admit: 2016-03-20 | Discharge: 2016-03-20 | Disposition: A | Discharge status: Home / Self Care | Payer: Managed Care, Other (non HMO) | Attending: Internal Medicine | Admitting: Internal Medicine

## 2016-03-20 ENCOUNTER — Encounter (HOSPITAL_COMMUNITY): Payer: Self-pay | Admitting: *Deleted

## 2016-03-20 ENCOUNTER — Emergency Department (HOSPITAL_COMMUNITY): Payer: Managed Care, Other (non HMO)

## 2016-03-20 DIAGNOSIS — E872 Acidosis: Secondary | ICD-10-CM | POA: Diagnosis present

## 2016-03-20 DIAGNOSIS — R55 Syncope and collapse: Secondary | ICD-10-CM | POA: Insufficient documentation

## 2016-03-20 DIAGNOSIS — R930 Abnormal findings on diagnostic imaging of skull and head, not elsewhere classified: Secondary | ICD-10-CM

## 2016-03-20 DIAGNOSIS — R9082 White matter disease, unspecified: Secondary | ICD-10-CM | POA: Diagnosis present

## 2016-03-20 DIAGNOSIS — R42 Dizziness and giddiness: Secondary | ICD-10-CM

## 2016-03-20 DIAGNOSIS — D72829 Elevated white blood cell count, unspecified: Secondary | ICD-10-CM | POA: Diagnosis present

## 2016-03-20 DIAGNOSIS — G35 Multiple sclerosis: Secondary | ICD-10-CM | POA: Diagnosis present

## 2016-03-20 DIAGNOSIS — Z79899 Other long term (current) drug therapy: Secondary | ICD-10-CM | POA: Diagnosis not present

## 2016-03-20 DIAGNOSIS — Z885 Allergy status to narcotic agent status: Secondary | ICD-10-CM | POA: Diagnosis not present

## 2016-03-20 DIAGNOSIS — F17200 Nicotine dependence, unspecified, uncomplicated: Secondary | ICD-10-CM | POA: Diagnosis present

## 2016-03-20 DIAGNOSIS — F419 Anxiety disorder, unspecified: Secondary | ICD-10-CM | POA: Diagnosis present

## 2016-03-20 LAB — URINALYSIS, ROUTINE W REFLEX MICROSCOPIC
Bilirubin Urine: NEGATIVE
Glucose, UA: NEGATIVE mg/dL
HGB URINE DIPSTICK: NEGATIVE
Ketones, ur: NEGATIVE mg/dL
Leukocytes, UA: NEGATIVE
Nitrite: NEGATIVE
PH: 5.5 (ref 5.0–8.0)
Protein, ur: NEGATIVE mg/dL
SPECIFIC GRAVITY, URINE: 1.035 — AB (ref 1.005–1.030)

## 2016-03-20 MED ORDER — DIAZEPAM 5 MG/ML IJ SOLN
2.5000 mg | Freq: Once | INTRAMUSCULAR | Status: AC
Start: 2016-03-20 — End: 2016-03-20
  Administered 2016-03-20: 2.5 mg via INTRAVENOUS
  Filled 2016-03-20: qty 2

## 2016-03-20 MED ORDER — NAPROXEN SODIUM 275 MG PO TABS
275.0000 mg | ORAL_TABLET | Freq: Two times a day (BID) | ORAL | Status: DC | PRN
  Administered 2016-03-21: 275 mg via ORAL
  Filled 2016-03-20 (×2): qty 1

## 2016-03-20 MED ORDER — CLONAZEPAM 0.5 MG PO TABS
0.2500 mg | ORAL_TABLET | Freq: Every day | ORAL | Status: DC
  Administered 2016-03-20 – 2016-03-22 (×3): 0.25 mg via ORAL
  Filled 2016-03-20 (×4): qty 1

## 2016-03-20 MED ORDER — GADOBENATE DIMEGLUMINE 529 MG/ML IV SOLN
15.0000 mL | Freq: Once | INTRAVENOUS | Status: AC | PRN
  Administered 2016-03-20: 15 mL via INTRAVENOUS

## 2016-03-20 MED ORDER — ESCITALOPRAM OXALATE 10 MG PO TABS
10.0000 mg | ORAL_TABLET | Freq: Every day | ORAL | Status: DC
  Administered 2016-03-20 – 2016-03-23 (×4): 10 mg via ORAL
  Filled 2016-03-20 (×4): qty 1

## 2016-03-20 MED ORDER — MECLIZINE HCL 12.5 MG PO TABS
25.0000 mg | ORAL_TABLET | Freq: Three times a day (TID) | ORAL | Status: DC | PRN
  Administered 2016-03-20: 25 mg via ORAL
  Filled 2016-03-20: qty 2

## 2016-03-20 NOTE — Progress Notes (Signed)
Routine EEG completed, results pending. 

## 2016-03-20 NOTE — Progress Notes (Signed)
PROGRESS NOTE                                                                                                                                                                                                             Patient Demographics:    Virginia [name], is a 35 y.o. female, DOB - 1981-06-09, ZOX:[id number]  Admit date - 03/19/2016   Admitting Physician Hillary Bow, DO  Outpatient Primary MD for the patient is No PCP Per Patient  LOS - 0  Outpatient Specialists: none  Chief Complaint  Patient presents with  . Loss of Consciousness  . Dizziness  . Emesis  . Diarrhea  . Fatigue       Brief Narrative     Subjective:   reports dizziness when moving her neck side to side. Has episodic numbness of feet   Assessment  & Plan :    Principal Problem:   White matter abnormality on MRI of brain Unclear if this is demyelinating lesion versus peripheral vertigo. Appreciate neurology evaluation. EEG unremarkable for seizures. PT evaluation pending Possibly will need LP on a nonurgent basis. Do not recommend IVIG or high-dose steroid at this time. Symptoms improving with when necessary meclizine and Valium. Check Lyme titer.  If stable after PT evaluation the morning we'll send her home with outpatient neurology follow-up.       Code Status :Full code  Family Communication  : None At bedside  Disposition Plan  : home tomorrow if improved    Consults  :  Neurology  Procedures  : MRI brain EEG  DVT Prophylaxis  :  Lovenox -  Lab Results  Component Value Date   PLT 335 03/19/2016    Antibiotics  :    Anti-infectives    None        Objective:   Filed Vitals:   03/20/16 1345 03/20/16 1536 03/20/16 1615 03/20/16 1645  BP: 87/42  95/50 110/63  Pulse: 68   82  Temp: 97.9 F (36.6 C)   98.1 F (36.7 C)  TempSrc: Oral   Oral  Resp: 16   20  Height:  5\' 4"  (1.626 m)    SpO2: 98%   100%     Wt Readings from Last 3 Encounters:  02/24/16 81.647 kg (180 lb)  10/25/15 92.987 kg (205 lb)  01/24/13  93.441 kg (206 lb)    No intake or output data in the 24 hours ending 03/20/16 1718   Physical Exam  Gen: not in distress HEENT:  moist mucosa, supple neck, no nystagmus  Chest: clear b/l, no added sounds CVS: N S1&S2, no murmurs, rubs or gallop GI: soft, NT, ND, BS+ Musculoskeletal: warm, no edema CNS: AAOX3, non focal    Data Review:    CBC  Recent Labs Lab 03/19/16 1929  WBC 14.2*  HGB 14.5  HCT 42.6  PLT 335  MCV 90.8  MCH 30.9  MCHC 34.0  RDW 12.1  LYMPHSABS 2.1  MONOABS 0.6  EOSABS 0.1  BASOSABS 0.0    Chemistries   Recent Labs Lab 03/19/16 1929  NA 137  K 3.7  CL 110  CO2 18*  GLUCOSE 100*  BUN 16  CREATININE 0.74  CALCIUM 8.9   ------------------------------------------------------------------------------------------------------------------ No results for input(s): CHOL, HDL, LDLCALC, TRIG, CHOLHDL, LDLDIRECT in the last 72 hours.  No results found for: HGBA1C ------------------------------------------------------------------------------------------------------------------ No results for input(s): TSH, T4TOTAL, T3FREE, THYROIDAB in the last 72 hours.  Invalid input(s): FREET3 ------------------------------------------------------------------------------------------------------------------ No results for input(s): VITAMINB12, FOLATE, FERRITIN, TIBC, IRON, RETICCTPCT in the last 72 hours.  Coagulation profile No results for input(s): INR, PROTIME in the last 168 hours.  No results for input(s): DDIMER in the last 72 hours.  Cardiac Enzymes No results for input(s): CKMB, TROPONINI, MYOGLOBIN in the last 168 hours.  Invalid input(s): CK ------------------------------------------------------------------------------------------------------------------ No results found for: BNP  Inpatient Medications  Scheduled  Meds: Continuous Infusions: PRN Meds:.  Micro Results No results found for this or any previous visit (from the past 240 hour(s)).  Radiology Reports Mr Laqueta Jean Wo Contrast  03/20/2016  CLINICAL DATA:  Initial evaluation for acute syncope. EXAM: MRI HEAD WITHOUT AND WITH CONTRAST TECHNIQUE: Multiplanar, multiecho pulse sequences of the brain and surrounding structures were obtained without and with intravenous contrast. CONTRAST:  15mL MULTIHANCE GADOBENATE DIMEGLUMINE 529 MG/ML IV SOLN COMPARISON:  None. FINDINGS: Cerebral volume normal for patient age. Patchy multi focal T2/FLAIR hyperintense foci present within the periventricular, deep, and subcortical white matter of both cerebral hemispheres. These are predominantly located within the bilateral parietal regions. The most prominent of these is positioned within the periventricular white matter of the left parietal region and measures approximately 7 mm (series 6, image 16). This lesion demonstrates central T2 cystic signal intensity with surrounding T2/FLAIR edema. Mildly hyperintense diffusion-weighted signal seen about several of these lesions. Many of these lesions enhance following gadolinium administration. Findings are most consistent with demyelinating disease (multiple sclerosis). Postcontrast enhancement is consistent with active demyelination. No lesions involving the posterior fossa or brainstem identified. No evidence for acute infarct. Gray-white matter differentiation maintained. Major intracranial vascular flow voids are preserved. Diminutive vertebrobasilar system with possible right trigeminal artery noted. No acute or chronic intracranial hemorrhage. No mass lesion, midline shift, or mass effect. No hydrocephalus. No extra-axial fluid collection. Major dural sinuses are grossly patent. Incidental note made of a DVA within the right parietal lobe. Craniocervical junction normal. Visualized upper cervical spine unremarkable. No lesions  seen within the upper cervical spinal cord. Pituitary gland normal. No acute abnormality about the globes and orbits. Optic nerves grossly unremarkable. Paranasal sinuses are clear. Trace opacity within the right mastoid air cells. Left mastoid air cells are clear. Inner ear structures normal. Diffusely decreased T1 signal intensity within the vertebral body bone marrow, nonspecific, but most commonly related to smoking, anemia, or obesity. No scalp soft  tissue abnormality. IMPRESSION: 1. Abnormal patchy T2/FLAIR hyperintense foci involving the periventricular, deep, and subcortical white matter of both cerebral hemispheres, greatest within the parietal regions. Primary differential consideration consists of underlying demyelinating disease (multiple sclerosis). Many of these foci demonstrate postcontrast enhancement, consistent with active demyelination. 2. Diffusely decreased T1 signal intensity within the visualized bone marrow, nonspecific, but most commonly related to anemia, smoking, obesity, or other underlying lymphoproliferative disorder. Electronically Signed   By: Rise Mu M.D.   On: 03/20/2016 03:31    Time Spent in minutes  25   Eddie North M.D on 03/20/2016 at 5:18 PM  Between 7am to 7pm - Pager - [phone]  After 7pm go to [email or web address] - password [facility]  [facility] -  Office  [phone]

## 2016-03-20 NOTE — H&P (Signed)
History and Physical    Virginia [name] RVI:[id number] DOB: 1980/12/20 DOA: 03/19/2016   PCP: No PCP Per Patient Chief Complaint:  Chief Complaint  Patient presents with  . Loss of Consciousness  . Dizziness  . Emesis  . Diarrhea  . Fatigue    HPI: Virginia [name] is a 35 y.o. female with medical history significant of previously healthy.  Patient presents to the ED with c/o worsening dizziness and vertigo for the last several weeks.  Today was even worse and she fell against a wall due to lack of ballance when getting laundry out of basket this afternoon.  She attempted to walk down the hall but had a full syncopal episode.  She also reports 2 episodes of NBNB emesis over the course of the day.  Did have a tick bite some 3 weeks ago.  ED Course: MRI brain performed which shows multiple white matter abnormalities that appear to be c/w demyelinating lesions, some of these are active.  Review of Systems: As per HPI otherwise 10 point review of systems negative.    Past Medical History  Diagnosis Date  . Anxiety     History reviewed. No pertinent past surgical history.   reports that she has been smoking.  She does not have any smokeless tobacco history on file. She reports that she drinks alcohol. She reports that she does not use illicit drugs.  Allergies  Allergen Reactions  . Codeine Other (See Comments)    Hallucinations     No family history on file.   Prior to Admission medications   Medication Sig Start Date End Date Taking? Authorizing Provider  clonazePAM (KLONOPIN) 0.5 MG tablet Take 0.25 mg by mouth at bedtime. 02/29/16  Yes Historical Provider, MD  escitalopram (LEXAPRO) 10 MG tablet Take 10 mg by mouth daily. 02/25/16  Yes Historical Provider, MD  naproxen sodium (ANAPROX) 220 MG tablet Take 220 mg by mouth 2 (two) times daily as needed (for pain).   Yes Historical Provider, MD    Physical Exam: Filed Vitals:   03/20/16 0100 03/20/16 0315 03/20/16 0330 03/20/16  0400  BP: 107/68 124/74 128/84 107/64  Pulse: 64 82 62 61  Temp:      TempSrc:      Resp: 18 14    SpO2: 98% 98% 98% 97%      Constitutional: NAD, calm, comfortable Eyes: PERRL, lids and conjunctivae normal ENMT: Mucous membranes are moist. Posterior pharynx clear of any exudate or lesions.Normal dentition.  Neck: normal, supple, no masses, no thyromegaly Respiratory: clear to auscultation bilaterally, no wheezing, no crackles. Normal respiratory effort. No accessory muscle use.  Cardiovascular: Regular rate and rhythm, no murmurs / rubs / gallops. No extremity edema. 2+ pedal pulses. No carotid bruits.  Abdomen: no tenderness, no masses palpated. No hepatosplenomegaly. Bowel sounds positive.  Musculoskeletal: no clubbing / cyanosis. No joint deformity upper and lower extremities. Good ROM, no contractures. Normal muscle tone.  Skin: no rashes, lesions, ulcers. No induration Neurologic: CN 2-12 grossly intact. Sensation intact, DTR normal. Strength 5/5 in all 4.  Psychiatric: Normal judgment and insight. Alert and oriented x 3. Normal mood.    Labs on Admission: I have personally reviewed following labs and imaging studies  CBC:  Recent Labs Lab 03/19/16 1929  WBC 14.2*  NEUTROABS 11.5*  HGB 14.5  HCT 42.6  MCV 90.8  PLT 335   Basic Metabolic Panel:  Recent Labs Lab 03/19/16 1929  NA 137  K 3.7  CL 110  CO2 18*  GLUCOSE 100*  BUN 16  CREATININE 0.74  CALCIUM 8.9   GFR: CrCl cannot be calculated (Unknown ideal weight.). Liver Function Tests: No results for input(s): AST, ALT, ALKPHOS, BILITOT, PROT, ALBUMIN in the last 168 hours. No results for input(s): LIPASE, AMYLASE in the last 168 hours. No results for input(s): AMMONIA in the last 168 hours. Coagulation Profile: No results for input(s): INR, PROTIME in the last 168 hours. Cardiac Enzymes: No results for input(s): CKTOTAL, CKMB, CKMBINDEX, TROPONINI in the last 168 hours. BNP (last 3 results) No  results for input(s): PROBNP in the last 8760 hours. HbA1C: No results for input(s): HGBA1C in the last 72 hours. CBG: No results for input(s): GLUCAP in the last 168 hours. Lipid Profile: No results for input(s): CHOL, HDL, LDLCALC, TRIG, CHOLHDL, LDLDIRECT in the last 72 hours. Thyroid Function Tests: No results for input(s): TSH, T4TOTAL, FREET4, T3FREE, THYROIDAB in the last 72 hours. Anemia Panel: No results for input(s): VITAMINB12, FOLATE, FERRITIN, TIBC, IRON, RETICCTPCT in the last 72 hours. Urine analysis:    Component Value Date/Time   COLORURINE YELLOW 02/24/2016 1146   APPEARANCEUR CLEAR 02/24/2016 1146   LABSPEC 1.010 02/24/2016 1146   PHURINE 6.0 02/24/2016 1146   GLUCOSEU NEGATIVE 02/24/2016 1146   HGBUR MODERATE* 02/24/2016 1146   BILIRUBINUR NEGATIVE 02/24/2016 1146   KETONESUR 15* 02/24/2016 1146   PROTEINUR NEGATIVE 02/24/2016 1146   UROBILINOGEN 0.2 01/24/2013 1320   NITRITE NEGATIVE 02/24/2016 1146   LEUKOCYTESUR NEGATIVE 02/24/2016 1146   Sepsis Labs: @LABRCNTIP (procalcitonin:4,lacticidven:4) )No results found for this or any previous visit (from the past 240 hour(s)).   Radiological Exams on Admission: Mr Lodema Pilot Contrast  03/20/2016  CLINICAL DATA:  Initial evaluation for acute syncope. EXAM: MRI HEAD WITHOUT AND WITH CONTRAST TECHNIQUE: Multiplanar, multiecho pulse sequences of the brain and surrounding structures were obtained without and with intravenous contrast. CONTRAST:  15mL MULTIHANCE GADOBENATE DIMEGLUMINE 529 MG/ML IV SOLN COMPARISON:  None. FINDINGS: Cerebral volume normal for patient age. Patchy multi focal T2/FLAIR hyperintense foci present within the periventricular, deep, and subcortical white matter of both cerebral hemispheres. These are predominantly located within the bilateral parietal regions. The most prominent of these is positioned within the periventricular white matter of the left parietal region and measures approximately 7 mm  (series 6, image 16). This lesion demonstrates central T2 cystic signal intensity with surrounding T2/FLAIR edema. Mildly hyperintense diffusion-weighted signal seen about several of these lesions. Many of these lesions enhance following gadolinium administration. Findings are most consistent with demyelinating disease (multiple sclerosis). Postcontrast enhancement is consistent with active demyelination. No lesions involving the posterior fossa or brainstem identified. No evidence for acute infarct. Gray-white matter differentiation maintained. Major intracranial vascular flow voids are preserved. Diminutive vertebrobasilar system with possible right trigeminal artery noted. No acute or chronic intracranial hemorrhage. No mass lesion, midline shift, or mass effect. No hydrocephalus. No extra-axial fluid collection. Major dural sinuses are grossly patent. Incidental note made of a DVA within the right parietal lobe. Craniocervical junction normal. Visualized upper cervical spine unremarkable. No lesions seen within the upper cervical spinal cord. Pituitary gland normal. No acute abnormality about the globes and orbits. Optic nerves grossly unremarkable. Paranasal sinuses are clear. Trace opacity within the right mastoid air cells. Left mastoid air cells are clear. Inner ear structures normal. Diffusely decreased T1 signal intensity within the vertebral body bone marrow, nonspecific, but most commonly related to smoking, anemia, or obesity. No scalp soft tissue abnormality. IMPRESSION: 1. Abnormal  patchy T2/FLAIR hyperintense foci involving the periventricular, deep, and subcortical white matter of both cerebral hemispheres, greatest within the parietal regions. Primary differential consideration consists of underlying demyelinating disease (multiple sclerosis). Many of these foci demonstrate postcontrast enhancement, consistent with active demyelination. 2. Diffusely decreased T1 signal intensity within the  visualized bone marrow, nonspecific, but most commonly related to anemia, smoking, obesity, or other underlying lymphoproliferative disorder. Electronically Signed   By: Rise Mu M.D.   On: 03/20/2016 03:31    EKG: Independently reviewed.  Assessment/Plan Principal Problem:   White matter abnormality on MRI of brain   Demyelinating lesions on MRI brain - suspicious for MS  Neurology coming to evaluate patient  Admitting patient  Lyme titer ordered  Neurology has asked to hold off on further study orders or steroids until they see the patient in consult.   DVT prophylaxis: Lovenox Code Status: Full Family Communication: No family in room Consults called: Dr. Roseanne Reno called by EDP for consult Admission status: Admit to inpatient   Hillary Bow DO [facility] Pager [phone] from 7PM-7AM  If 7AM-7PM, please contact the day physician for the patient [email or web address] Password [email or web address]  03/20/2016, 5:15 AM

## 2016-03-20 NOTE — Progress Notes (Signed)
patein seen this AM at 0830. States diazepam is helping with the vertigo. Awaiting work EEG and PT.   Felicie Morn PA-C [facility] [phone]  03/20/2016, 9:00 AM

## 2016-03-20 NOTE — Care Management Note (Signed)
Case Management Note  Patient Details  Name: Virginia [name] MRN: [id number] Date of Birth: 07-Jan-1981  Subjective/Objective:   Pt admitted with recurrent Vertigo. R/O MS.                  Action/Plan: Continued medical work up. CM following for d/c needs.   Expected Discharge Date:                  Expected Discharge Plan:  Home/Self Care  In-House Referral:     Discharge planning Services     Post Acute Care Choice:    Choice offered to:     DME Arranged:    DME Agency:     HH Arranged:    HH Agency:     Status of Service:  In process, will continue to follow  Medicare Important Message Given:    Date Medicare IM Given:    Medicare IM give by:    Date Additional Medicare IM Given:    Additional Medicare Important Message give by:     If discussed at Long Length of Stay Meetings, dates discussed:    Additional Comments:  Kermit Balo, RN 03/20/2016, 11:23 AM

## 2016-03-20 NOTE — Progress Notes (Signed)
Patient arrived to 46M. She is alert, oriented x4, equal strength, moving all extremities. Skin intact. Oriented to unit, floor, staff. Will contineu to monitor

## 2016-03-20 NOTE — Procedures (Signed)
HPI:  35 y/o with syncope  TECHNICAL SUMMARY:  A multichannel referential and bipolar montage EEG using the standard international 10-20 system was performed on the patient described as awake, drowsy and asleep.  The dominant background activity consists of 11-12 hertz activity seen most prominantly over the posterior head region.  The backgound activity is reactive to eye opening and closing procedures.  Low voltage fast (beta) activity is distributed symmetrically and maximally over the anterior head regions.  ACTIVATION:  Stepwise photic stimulation at 4-20 flashes per second was performed and did not elicit any abnormal waveforms but did produce a symmetric driving response.  Hyperventilation was performed for 3 minutes and produced no changes in the background activity.  EPILEPTIFORM ACTIVITY:  There were no spikes, sharp waves or paroxysmal activity.  SLEEP:  Both stage 1 and 2 sleep were noted.  CARDIAC:  The EKG lead revealed a regular sinus rhythm.  IMPRESSION:  This is a normal EEG for the patients stated age.  There were no focal, hemispheric or lateralizing features.  No epileptiform activity was recorded.  A normal EEG does not exclude the diagnosis of a seizure disorder and if seizure remains high on the list of differential diagnosis, an ambulatory EEG may be of value.  Clinical correlation is required.

## 2016-03-20 NOTE — Consult Note (Addendum)
Admission H&P    Chief Complaint: Vertigo and syncope.  HPI: Virginia [name] is an 35 y.o. female with a history of anxiety and a near syncopal episode about 3-1/2 weeks ago, presenting with 3 week history of recurrent vertigo which appears to be precipitated by changes in position. Vertigo became worse on 03/19/2016. Patient had one episode of vertigo on standing from a stooped position. She was unconscious for about 5 minutes. No postictal confusion was described. She has not experienced visual changes nor change in speech. She's had no weakness no numbness involving the extremities. MRI of her brain showed patchy T2/FLAIR hyperintense foci involving periventricular white matter of both foci, most prominently in the parietal regions, suspicious for possible demyelinating disorder. Patient was given meclizine in the ED which did not improve her symptoms of vertigo.  Past Medical History  Diagnosis Date  . Anxiety     History reviewed. No pertinent past surgical history.  Family history: Reviewed and noncontributory.  Social History:  reports that she has been smoking.  She does not have any smokeless tobacco history on file. She reports that she drinks alcohol. She reports that she does not use illicit drugs.  Allergies:  Allergies  Allergen Reactions  . Codeine Other (See Comments)    Hallucinations     Medications: Preadmission medications were reviewed by me.  ROS: History obtained from the patient  General ROS: negative for - chills, fatigue, fever, night sweats, weight gain or weight loss Psychological ROS: negative for - behavioral disorder, hallucinations, memory difficulties, mood swings or suicidal ideation Ophthalmic ROS: negative for - blurry vision, double vision, eye pain or loss of vision ENT ROS: negative for - epistaxis, nasal discharge, oral lesions, sore throat, tinnitus or vertigo Allergy and Immunology ROS: negative for - hives or itchy/watery  eyes Hematological and Lymphatic ROS: negative for - bleeding problems, bruising or swollen lymph nodes Endocrine ROS: negative for - galactorrhea, hair pattern changes, polydipsia/polyuria or temperature intolerance Respiratory ROS: negative for - cough, hemoptysis, shortness of breath or wheezing Cardiovascular ROS: negative for - chest pain, dyspnea on exertion, edema or irregular heartbeat Gastrointestinal ROS: negative for - abdominal pain, diarrhea, hematemesis, nausea/vomiting or stool incontinence Genito-Urinary ROS: negative for - dysuria, hematuria, incontinence or urinary frequency/urgency Musculoskeletal ROS: negative for - joint swelling or muscular weakness Neurological ROS: as noted in HPI Dermatological ROS: negative for rash and skin lesion changes  Physical Examination: Blood pressure 107/64, pulse 61, temperature 98.1 F (36.7 C), temperature source Oral, resp. rate 14, last menstrual period 02/23/2016, SpO2 97 %.  HEENT-  Normocephalic, no lesions, without obvious abnormality.  Normal external eye and conjunctiva.  Normal TM's bilaterally.  Normal auditory canals and external ears. Normal external nose, mucus membranes and septum.  Normal pharynx. Neck supple with no masses, nodes, nodules or enlargement. Cardiovascular - regular rate and rhythm, S1, S2 normal, no murmur, click, rub or gallop Lungs - chest clear, no wheezing, rales, normal symmetric air entry Abdomen - soft, non-tender; bowel sounds normal; no masses,  no organomegaly Extremities - no joint deformities, effusion, or inflammation and no edema  Neurologic Examination: Mental Status: Alert, oriented, thought content appropriate.  Speech fluent without evidence of aphasia. Able to follow commands without difficulty. Cranial Nerves: II-Visual fields were normal. III/IV/VI-Pupils were equal and reacted normally to light. Extraocular movements were full and conjugate without nystagmus.    V/VII-no facial  numbness and no facial weakness. VIII-normal. X-normal speech and symmetrical palatal movement. XI: trapezius strength/neck flexion  strength normal bilaterally XII-midline tongue extension with normal strength. Motor: 5/5 bilaterally with normal tone and bulk Sensory: Normal throughout. Deep Tendon Reflexes: 2+ and symmetric. Plantars: Mute bilaterally Cerebellar: Normal finger-to-nose testing. Carotid auscultation: Normal  Results for orders placed or performed during the hospital encounter of 03/19/16 (from the past 48 hour(s))  CBC with Differential     Status: Abnormal   Collection Time: 03/19/16  7:29 PM  Result Value Ref Range   WBC 14.2 (H) 4.0 - 10.5 K/uL   RBC 4.69 3.87 - 5.11 MIL/uL   Hemoglobin 14.5 12.0 - 15.0 g/dL   HCT 42.6 36.0 - 46.0 %   MCV 90.8 78.0 - 100.0 fL   MCH 30.9 26.0 - 34.0 pg   MCHC 34.0 30.0 - 36.0 g/dL   RDW 12.1 11.5 - 15.5 %   Platelets 335 150 - 400 K/uL   Neutrophils Relative % 81 %   Neutro Abs 11.5 (H) 1.7 - 7.7 K/uL   Lymphocytes Relative 15 %   Lymphs Abs 2.1 0.7 - 4.0 K/uL   Monocytes Relative 4 %   Monocytes Absolute 0.6 0.1 - 1.0 K/uL   Eosinophils Relative 0 %   Eosinophils Absolute 0.1 0.0 - 0.7 K/uL   Basophils Relative 0 %   Basophils Absolute 0.0 0.0 - 0.1 K/uL  Basic metabolic panel     Status: Abnormal   Collection Time: 03/19/16  7:29 PM  Result Value Ref Range   Sodium 137 135 - 145 mmol/L   Potassium 3.7 3.5 - 5.1 mmol/L   Chloride 110 101 - 111 mmol/L   CO2 18 (L) 22 - 32 mmol/L   Glucose, Bld 100 (H) 65 - 99 mg/dL   BUN 16 6 - 20 mg/dL   Creatinine, Ser 0.74 0.44 - 1.00 mg/dL   Calcium 8.9 8.9 - 10.3 mg/dL   GFR calc non Af Amer >60 >60 mL/min   GFR calc Af Amer >60 >60 mL/min    Comment: (NOTE) The eGFR has been calculated using the CKD EPI equation. This calculation has not been validated in all clinical situations. eGFR's persistently <60 mL/min signify possible Chronic Kidney Disease.    Anion gap 9 5 - 15   I-Stat Beta hCG blood, ED ([facility], [facility], [facility] only)     Status: None   Collection Time: 03/19/16  7:38 PM  Result Value Ref Range   I-stat hCG, quantitative <5.0 <5 mIU/mL   Comment 3            Comment:   GEST. AGE      CONC.  (mIU/mL)   <=1 WEEK        5 - 50     2 WEEKS       50 - 500     3 WEEKS       100 - 10,000     4 WEEKS     1,000 - 30,000        FEMALE AND NON-PREGNANT FEMALE:     LESS THAN 5 mIU/mL   I-stat troponin, ED     Status: None   Collection Time: 03/19/16 11:48 PM  Result Value Ref Range   Troponin i, poc 0.00 0.00 - 0.08 ng/mL   Comment 3            Comment: Due to the release kinetics of cTnI, a negative result within the first hours of the onset of symptoms does not rule out myocardial infarction with certainty. If myocardial  infarction is still suspected, repeat the test at appropriate intervals.    Mr Jeri Cos Wo Contrast  03/20/2016  CLINICAL DATA:  Initial evaluation for acute syncope. EXAM: MRI HEAD WITHOUT AND WITH CONTRAST TECHNIQUE: Multiplanar, multiecho pulse sequences of the brain and surrounding structures were obtained without and with intravenous contrast. CONTRAST:  6m MULTIHANCE GADOBENATE DIMEGLUMINE 529 MG/ML IV SOLN COMPARISON:  None. FINDINGS: Cerebral volume normal for patient age. Patchy multi focal T2/FLAIR hyperintense foci present within the periventricular, deep, and subcortical white matter of both cerebral hemispheres. These are predominantly located within the bilateral parietal regions. The most prominent of these is positioned within the periventricular white matter of the left parietal region and measures approximately 7 mm (series 6, image 16). This lesion demonstrates central T2 cystic signal intensity with surrounding T2/FLAIR edema. Mildly hyperintense diffusion-weighted signal seen about several of these lesions. Many of these lesions enhance following gadolinium administration. Findings are most consistent with demyelinating disease  (multiple sclerosis). Postcontrast enhancement is consistent with active demyelination. No lesions involving the posterior fossa or brainstem identified. No evidence for acute infarct. Gray-white matter differentiation maintained. Major intracranial vascular flow voids are preserved. Diminutive vertebrobasilar system with possible right trigeminal artery noted. No acute or chronic intracranial hemorrhage. No mass lesion, midline shift, or mass effect. No hydrocephalus. No extra-axial fluid collection. Major dural sinuses are grossly patent. Incidental note made of a DVA within the right parietal lobe. Craniocervical junction normal. Visualized upper cervical spine unremarkable. No lesions seen within the upper cervical spinal cord. Pituitary gland normal. No acute abnormality about the globes and orbits. Optic nerves grossly unremarkable. Paranasal sinuses are clear. Trace opacity within the right mastoid air cells. Left mastoid air cells are clear. Inner ear structures normal. Diffusely decreased T1 signal intensity within the vertebral body bone marrow, nonspecific, but most commonly related to smoking, anemia, or obesity. No scalp soft tissue abnormality. IMPRESSION: 1. Abnormal patchy T2/FLAIR hyperintense foci involving the periventricular, deep, and subcortical white matter of both cerebral hemispheres, greatest within the parietal regions. Primary differential consideration consists of underlying demyelinating disease (multiple sclerosis). Many of these foci demonstrate postcontrast enhancement, consistent with active demyelination. 2. Diffusely decreased T1 signal intensity within the visualized bone marrow, nonspecific, but most commonly related to anemia, smoking, obesity, or other underlying lymphoproliferative disorder. Electronically Signed   By: BJeannine BogaM.D.   On: 03/20/2016 03:31    Assessment/Plan 35year old lady with intractable position and induced vertigo, as well as syncopal  spell associated with an episode of severe vertigo. Exam showed no focal deficits. Significance of MRI findings is unclear, and could represent manifestations of demyelinating disorder. No evidence of a cerebellar or brainstem lesion was seen, however.  Recommendations: 1. Continue meclizine 25 mg every 6 hours when necessary vertigo 2. Physical therapy consult for vestibular training 3. No clear indication for high-dose IV steroid treatment at this point 4. Elective lumbar puncture for further evaluation of possible demyelinating disorder will be considered 5. EEG, routine about study  We will continue to follow this patient with you.  C.R. SNicole Kindred MD [facility] [phone] 03/20/2016, 5:49 AM

## 2016-03-21 DIAGNOSIS — G35A Relapsing-remitting multiple sclerosis: Secondary | ICD-10-CM | POA: Insufficient documentation

## 2016-03-21 DIAGNOSIS — G35 Multiple sclerosis: Secondary | ICD-10-CM | POA: Insufficient documentation

## 2016-03-21 DIAGNOSIS — R42 Dizziness and giddiness: Secondary | ICD-10-CM | POA: Insufficient documentation

## 2016-03-21 LAB — PROTEIN AND GLUCOSE, CSF
GLUCOSE CSF: 61 mg/dL (ref 40–70)
Total  Protein, CSF: 35 mg/dL (ref 15–45)

## 2016-03-21 LAB — EHRLICHIA ANTIBODY PANEL
E chaffeensis (HGE) Ab, IgG: NEGATIVE
E chaffeensis (HGE) Ab, IgM: NEGATIVE
E. CHAFFEENSIS (HME) IGM TITER: NEGATIVE
E.Chaffeensis (HME) IgG: NEGATIVE

## 2016-03-21 LAB — ROCKY MTN SPOTTED FVR ABS PNL(IGG+IGM)
RMSF IGG: NEGATIVE
RMSF IgM: 1.41 index — ABNORMAL HIGH (ref 0.00–0.89)

## 2016-03-21 LAB — CSF CELL COUNT WITH DIFFERENTIAL
RBC Count, CSF: 280 /mm3 — ABNORMAL HIGH
Tube #: 1
WBC, CSF: 4 /mm3 (ref 0–5)

## 2016-03-21 MED ORDER — MECLIZINE HCL 12.5 MG PO TABS
25.0000 mg | ORAL_TABLET | Freq: Three times a day (TID) | ORAL | Status: DC
  Administered 2016-03-21 – 2016-03-23 (×7): 25 mg via ORAL
  Filled 2016-03-21 (×7): qty 2

## 2016-03-21 MED ORDER — SODIUM CHLORIDE 0.9 % IV SOLN
1000.0000 mg | INTRAVENOUS | Status: AC
  Administered 2016-03-22 – 2016-03-23 (×2): 1000 mg via INTRAVENOUS
  Filled 2016-03-21 (×2): qty 8

## 2016-03-21 MED ORDER — SODIUM CHLORIDE 0.9 % IV SOLN
1000.0000 mg | Freq: Once | INTRAVENOUS | Status: AC
  Administered 2016-03-21: 1000 mg via INTRAVENOUS
  Filled 2016-03-21: qty 8

## 2016-03-21 NOTE — Evaluation (Signed)
Physical Therapy Evaluation Patient Details Name: Virginia [name] MRN: [id number] DOB: 1980/12/07 Today's Date: 03/21/2016   History of Present Illness  Virginia [name] is a 35 y.o. female with a hx of anxiety, syncope presents to the Emergency Department complaining of Worsening dizziness for the last several weeks. Pt with reports of full syncopal episode when bending over to pick up something off floor.  Clinical Impression  Vestibular assessment completed. See general comments section for details. Pt neg. For nystagmus but + for dizziness with horizontal head turns. Treated pt for L horizontal BPPV. Recommend out pt PT follow for for vestibular dysfunction. Acute PT to follow and see first thing in AM to re-assess.    Follow Up Recommendations Outpatient PT;Supervision - Intermittent (vestibular rehab)    Equipment Recommendations  None recommended by PT    Recommendations for Other Services       Precautions / Restrictions Precautions Precautions: Fall Precaution Comments: dizziness with horizontal head turns Restrictions Weight Bearing Restrictions: No      Mobility  Bed Mobility Overal bed mobility: Modified Independent                Transfers Overall transfer level: Needs assistance Equipment used: None Transfers: Sit to/from Stand Sit to Stand: Supervision         General transfer comment: supervision due to report of dizziness with movement, however pt steady and reports dizziness at 1/10  Ambulation/Gait Ambulation/Gait assistance: Supervision;Min guard Ambulation Distance (Feet): 200 Feet Assistive device: None Gait Pattern/deviations: WFL(Within Functional Limits);Step-through pattern Gait velocity: wfl   General Gait Details: pt reports 1/10 dizziness with ambulation, did not improve with gaze stabilization. Pt with episode of  LOB when turing head to the Right requring min guard to regain balance  Stairs            Wheelchair Mobility     Modified Rankin (Stroke Patients Only)       Balance Overall balance assessment: Needs assistance Sitting-balance support: Feet supported;No upper extremity supported Sitting balance-Leahy Scale: Good     Standing balance support: No upper extremity supported   Standing balance comment: fine during static balance but unsteady with horizontal head turns                             Pertinent Vitals/Pain Pain Assessment: No/denies pain    Home Living Family/patient expects to be discharged to:: Private residence Living Arrangements: Spouse/significant other;Children (25 yo son) Available Help at Discharge: Family;Available 24 hours/day (son avail but husband works) Type of Home: House Home Access: Stairs to enter   Secretary/administrator of Steps: 1 Home Layout: One level Home Equipment: None      Prior Function Level of Independence: Independent         Comments: works     Higher education careers adviser   Dominant Hand: Right    Extremity/Trunk Assessment   Upper Extremity Assessment: Overall WFL for tasks assessed           Lower Extremity Assessment: Overall WFL for tasks assessed      Cervical / Trunk Assessment: Normal  Communication   Communication: No difficulties  Cognition Arousal/Alertness: Awake/alert Behavior During Therapy: WFL for tasks assessed/performed Overall Cognitive Status: Within Functional Limits for tasks assessed                      General Comments General comments (skin integrity, edema, etc.): Vestibular Assessment: due to pt with  onset of LOB with head turning to the Right, tested pt for horizontal BPPV. pt  neg for nystagmus however c/o dizziness with head turn to the L. treated pt for L horizontal BPPV via modified gufonis maneuvar x 2 trials. pt reports second trial less dizzy than first. pt also reports "i though I was going to throw up" with head thrust to the Right. pt able to track and before saccadic mvmts  appropriately    Exercises        Assessment/Plan    PT Assessment Patient needs continued PT services  PT Diagnosis Difficulty walking (BPPV)   PT Problem List Other (comment);Decreased activity tolerance;Decreased balance;Decreased mobility (vestibular dysfunction)  PT Treatment Interventions DME instruction;Gait training;Stair training;Functional mobility training;Therapeutic activities;Therapeutic exercise;Balance training (vestibular treatement)   PT Goals (Current goals can be found in the Care Plan section) Acute Rehab PT Goals Patient Stated Goal: stop the dizziness PT Goal Formulation: With patient Time For Goal Achievement: 03/28/16 Potential to Achieve Goals: Good Additional Goals Additional Goal #1: Pt to have dizziness at 1/10 with horizontal head turns to improve ability ambulate.    Frequency Min 4X/week   Barriers to discharge        Co-evaluation               End of Session Equipment Utilized During Treatment: Gait belt Activity Tolerance: Patient tolerated treatment well Patient left: in chair;with call bell/phone within reach Nurse Communication: Mobility status    Functional Assessment Tool Used: clinical judgement Functional Limitation: Mobility: Walking and moving around Mobility: Walking and Moving Around Current Status (A4166): At least 1 percent but less than 20 percent impaired, limited or restricted Mobility: Walking and Moving Around Goal Status [phone]): At least 1 percent but less than 20 percent impaired, limited or restricted    Time: 1105-1145 PT Time Calculation (min) (ACUTE ONLY): 40 min   Charges:   PT Evaluation $PT Eval Moderate Complexity: 1 Procedure PT Treatments $Therapeutic Activity: 8-22 mins $Canalith Rep Proc: 8-22 mins   PT G Codes:   PT G-Codes **NOT FOR INPATIENT CLASS** Functional Assessment Tool Used: clinical judgement Functional Limitation: Mobility: Walking and moving around Mobility: Walking and  Moving Around Current Status (S0109): At least 1 percent but less than 20 percent impaired, limited or restricted Mobility: Walking and Moving Around Goal Status [phone]): At least 1 percent but less than 20 percent impaired, limited or restricted    Marcene Brawn 03/21/2016, 12:29 PM   Lewis Shock, PT, DPT Pager #: [phone] Office #: [phone]

## 2016-03-21 NOTE — Procedures (Signed)
Indication: Multiple sclerosis  Risks of the procedure were dicussed with the patient including post-LP headache, bleeding, infection, weakness/numbness of legs(radiculopathy), death.  The patient/patient's proxy agreed and written consent was obtained.   The patient was prepped and draped, and using sterile technique a 20 gauge quinke spinal needle was inserted in the L4-5 space and bony resistance was met. The needle was then repositioned and initally blood tinged CSF that quickly cleared was obtained. The opening pressure was not measured. Approximately 5 cc of CSF were obtained and sent for analysis.    Roland Rack, MD [facility] [phone]  If 7pm- 7am, please page neurology on call as listed in [facility].

## 2016-03-21 NOTE — Progress Notes (Addendum)
PROGRESS NOTE                                                                                                                                                                                                             Patient Demographics:    Virginia [name], is a 35 y.o. female, DOB - [birth year], EXN:[id number]  Admit date - 03/19/2016   Admitting Physician Hillary Bow, DO  Outpatient Primary MD for the patient is No PCP Per Patient  LOS - 1  Outpatient Specialists: none  Chief Complaint  Patient presents with  . Loss of Consciousness  . Dizziness  . Emesis  . Diarrhea  . Fatigue       Brief Narrative  35 year old female with history of anxiety presented with three-week history of recurrent vertigo worsened with changing position and neck movement. Symptoms became worse on the day prior to admission. She became unconscious for almost 5 minutes. No postictal changes. No visual course speech impairment. Denied any weakness of the limbs but reports having tingling and some numbness in her bilateral feet for past 3 weeks. MRI brain showed patchy T2-flair hyperintense foci involving bilateral periventricular white matter, most prominent in the suspected of demyelinating disorder.    Subjective:   Still has some dizziness with neck movement and changing posterior. Denies nausea.   Assessment  & Plan :    Principal Problem:   White matter abnormality on MRI of brain Discussed with neurology. Suspect MS. Ordered MRI of the cervical, thoracic  spine. Started on high dose IV Solu-Medrol with plan on LP. EEG negative for seizures. Appreciate neurology evaluation. EEG unremarkable for seizures. PT evaluation recommends outpatient PT and vestibular rehabilitation. Continue scheduled meclizine.  Anxiety Continue Klonopin.        Code Status :Full code  Family Communication  : None At bedside  Disposition Plan  :  home after completion of 3 days of IV steroid    Consults  :  Neurology  Procedures  : MRI brain, spine EEG  DVT Prophylaxis  :  Lovenox -  Lab Results  Component Value Date   PLT 335 03/19/2016    Antibiotics  :    Anti-infectives    None        Objective:   Filed Vitals:   03/20/16 2110 03/21/16 0132 03/21/16 0604 03/21/16  1002  BP: 107/51 121/63 109/62 110/60  Pulse: 79 69 57 62  Temp: 99.5 F (37.5 C) 98.4 F (36.9 C) 98.7 F (37.1 C) 98.1 F (36.7 C)  TempSrc: Oral Oral Oral Oral  Resp: 20 20 20 18   Height:      SpO2: 99% 99% 99% 99%    Wt Readings from Last 3 Encounters:  02/24/16 81.647 kg (180 lb)  10/25/15 92.987 kg (205 lb)  01/24/13 93.441 kg (206 lb)    No intake or output data in the 24 hours ending 03/21/16 1310   Physical Exam  Gen: not in distress HEENT:  moist mucosa, supple neck, no nystagmus  Chest: clear b/l, no added sounds CVS: N S1&S2, no murmurs, rubs or gallop GI: soft, NT, ND, BS+ Musculoskeletal: warm, no edema CNS: AAOX3, non focal    Data Review:    CBC  Recent Labs Lab 03/19/16 1929  WBC 14.2*  HGB 14.5  HCT 42.6  PLT 335  MCV 90.8  MCH 30.9  MCHC 34.0  RDW 12.1  LYMPHSABS 2.1  MONOABS 0.6  EOSABS 0.1  BASOSABS 0.0    Chemistries   Recent Labs Lab 03/19/16 1929  NA 137  K 3.7  CL 110  CO2 18*  GLUCOSE 100*  BUN 16  CREATININE 0.74  CALCIUM 8.9   ------------------------------------------------------------------------------------------------------------------ No results for input(s): CHOL, HDL, LDLCALC, TRIG, CHOLHDL, LDLDIRECT in the last 72 hours.  No results found for: HGBA1C ------------------------------------------------------------------------------------------------------------------ No results for input(s): TSH, T4TOTAL, T3FREE, THYROIDAB in the last 72 hours.  Invalid input(s):  FREET3 ------------------------------------------------------------------------------------------------------------------ No results for input(s): VITAMINB12, FOLATE, FERRITIN, TIBC, IRON, RETICCTPCT in the last 72 hours.  Coagulation profile No results for input(s): INR, PROTIME in the last 168 hours.  No results for input(s): DDIMER in the last 72 hours.  Cardiac Enzymes No results for input(s): CKMB, TROPONINI, MYOGLOBIN in the last 168 hours.  Invalid input(s): CK ------------------------------------------------------------------------------------------------------------------ No results found for: BNP  Inpatient Medications  Scheduled Meds: Continuous Infusions: PRN Meds:.  Micro Results No results found for this or any previous visit (from the past 240 hour(s)).  Radiology Reports Mr Laqueta Jean Wo Contrast  03/20/2016  CLINICAL DATA:  Initial evaluation for acute syncope. EXAM: MRI HEAD WITHOUT AND WITH CONTRAST TECHNIQUE: Multiplanar, multiecho pulse sequences of the brain and surrounding structures were obtained without and with intravenous contrast. CONTRAST:  15mL MULTIHANCE GADOBENATE DIMEGLUMINE 529 MG/ML IV SOLN COMPARISON:  None. FINDINGS: Cerebral volume normal for patient age. Patchy multi focal T2/FLAIR hyperintense foci present within the periventricular, deep, and subcortical white matter of both cerebral hemispheres. These are predominantly located within the bilateral parietal regions. The most prominent of these is positioned within the periventricular white matter of the left parietal region and measures approximately 7 mm (series 6, image 16). This lesion demonstrates central T2 cystic signal intensity with surrounding T2/FLAIR edema. Mildly hyperintense diffusion-weighted signal seen about several of these lesions. Many of these lesions enhance following gadolinium administration. Findings are most consistent with demyelinating disease (multiple sclerosis).  Postcontrast enhancement is consistent with active demyelination. No lesions involving the posterior fossa or brainstem identified. No evidence for acute infarct. Gray-white matter differentiation maintained. Major intracranial vascular flow voids are preserved. Diminutive vertebrobasilar system with possible right trigeminal artery noted. No acute or chronic intracranial hemorrhage. No mass lesion, midline shift, or mass effect. No hydrocephalus. No extra-axial fluid collection. Major dural sinuses are grossly patent. Incidental note made of a DVA within the right parietal lobe.  Craniocervical junction normal. Visualized upper cervical spine unremarkable. No lesions seen within the upper cervical spinal cord. Pituitary gland normal. No acute abnormality about the globes and orbits. Optic nerves grossly unremarkable. Paranasal sinuses are clear. Trace opacity within the right mastoid air cells. Left mastoid air cells are clear. Inner ear structures normal. Diffusely decreased T1 signal intensity within the vertebral body bone marrow, nonspecific, but most commonly related to smoking, anemia, or obesity. No scalp soft tissue abnormality. IMPRESSION: 1. Abnormal patchy T2/FLAIR hyperintense foci involving the periventricular, deep, and subcortical white matter of both cerebral hemispheres, greatest within the parietal regions. Primary differential consideration consists of underlying demyelinating disease (multiple sclerosis). Many of these foci demonstrate postcontrast enhancement, consistent with active demyelination. 2. Diffusely decreased T1 signal intensity within the visualized bone marrow, nonspecific, but most commonly related to anemia, smoking, obesity, or other underlying lymphoproliferative disorder. Electronically Signed   By: Rise Mu M.D.   On: 03/20/2016 03:31    Time Spent in minutes  25   Eddie North M.D on 03/21/2016 at 1:10 PM  Between 7am to 7pm - Pager -  [phone]  After 7pm go to [email or web address] - password [facility]  [facility] -  Office  [phone]

## 2016-03-21 NOTE — Progress Notes (Signed)
Subjective: States she still feels vertigo from right to left when she turns her head. Dix's Hallpike showed no vertigo and she had no symptoms during maneuver. Awaiting PT and vestibular rehab.  Of note she has not been taking the Meclizine TID.   Exam: Filed Vitals:   03/21/16 0132 03/21/16 0604  BP: 121/63 109/62  Pulse: 69 57  Temp: 98.4 F (36.9 C) 98.7 F (37.1 C)  Resp: 20 20    Gen: In bed, NAD MS: alert and oriented CN:2-12 intact Motor: MAEW Sensory:intact throughout   Pertinent Labs/Diagnostics: EEG: IMPRESSION:  This is a normal EEG for the patients stated age. There were no focal, hemispheric or lateralizing features. No epileptiform activity was recorded. A normal EEG does not exclude the diagnosis of a seizure disorder and if seizure remains high on the list of differential diagnosis, an ambulatory EEG may be of value. Clinical correlation is required.  Felicie Morn PA-C [facility] [phone]  Impression: 35 YO female with dysequilibrium/vertigo in the setting of multiple  lesions, some enhancing, and some not. I suspect that this represents multiple sclerosis.   Recommendations: 1) lumbar puncture for cells, glucose, protein, oligoclonal bands.  2) MRI C and T spine.  3) Solumedrol 1g daily x 3 days.  4) PT 5) will follow.    Ritta Slot, MD [facility] [phone]  If 7pm- 7am, please page neurology on call as listed in AMION. 03/21/2016, 9:30 AM

## 2016-03-22 ENCOUNTER — Observation Stay (HOSPITAL_COMMUNITY): Payer: Managed Care, Other (non HMO)

## 2016-03-22 MED ORDER — PANTOPRAZOLE SODIUM 40 MG PO TBEC
40.0000 mg | DELAYED_RELEASE_TABLET | Freq: Every day | ORAL | Status: DC
  Administered 2016-03-22 – 2016-03-23 (×2): 40 mg via ORAL
  Filled 2016-03-22 (×2): qty 1

## 2016-03-22 MED ORDER — ACETAMINOPHEN 325 MG PO TABS
650.0000 mg | ORAL_TABLET | ORAL | Status: DC | PRN
  Administered 2016-03-23 (×2): 650 mg via ORAL
  Filled 2016-03-22 (×2): qty 2

## 2016-03-22 NOTE — Progress Notes (Signed)
Physical Therapy Treatment Patient Details Name: Semiyah Newgent MRN: [id number] DOB: [birth year] Today's Date: 03/22/2016    History of Present Illness Puanani Daidone is a 35 y.o. female with a hx of anxiety, syncope presents to the Emergency Department complaining of Worsening dizziness for the last several weeks. Pt with reports of full syncopal episode when bending over to pick up something off floor.    PT Comments    Pt very fatigue this AM from having LP late last night and MRI at 3am. Pt remains status quo from previous session and con't to have onset of dizziness with horizontal head roll to the L but looking R and looking up with ambulation. Pt deferred vestibular treatment this date due to extreme fatigue. Will have PT re-assess tomorrow.  Follow Up Recommendations  Outpatient PT;Supervision - Intermittent     Equipment Recommendations  None recommended by PT    Recommendations for Other Services       Precautions / Restrictions Precautions Precautions: Fall Precaution Comments: dizziness with head turns to R and looking up Restrictions Weight Bearing Restrictions: No    Mobility  Bed Mobility Overal bed mobility: Modified Independent                Transfers Overall transfer level: Needs assistance Equipment used: None Transfers: Sit to/from Stand Sit to Stand: Supervision         General transfer comment: pt steady with no difficulty  Ambulation/Gait Ambulation/Gait assistance: Supervision;Min guard Ambulation Distance (Feet): 200 Feet Assistive device: None Gait Pattern/deviations: WFL(Within Functional Limits) Gait velocity: wfl   General Gait Details: pt steady with exception of requiring minA with head turns to the R and looking up towards the ceiling   Stairs            Wheelchair Mobility    Modified Rankin (Stroke Patients Only)       Balance                                    Cognition Arousal/Alertness:  Awake/alert Behavior During Therapy: WFL for tasks assessed/performed Overall Cognitive Status: Within Functional Limits for tasks assessed                      Exercises      General Comments General comments (skin integrity, edema, etc.): Vestibular Assessment: pt con't to have onset of dizzines during horizontal head roll to the L. pt deferred treatment today due to "i am so exhausted"      Pertinent Vitals/Pain Pain Assessment: No/denies pain    Home Living                      Prior Function            PT Goals (current goals can now be found in the care plan section) Progress towards PT goals: Progressing toward goals    Frequency  Min 4X/week    PT Plan Current plan remains appropriate    Co-evaluation             End of Session Equipment Utilized During Treatment: Gait belt Activity Tolerance: Patient limited by fatigue Patient left: in bed;with call bell/phone within reach;with bed alarm set     Time: 4098-1191 PT Time Calculation (min) (ACUTE ONLY): 16 min  Charges:  $Gait Training: 8-22 mins  G Codes:      Marcene Brawn 03/22/2016, 4:21 PM   Lewis Shock, PT, DPT Pager #: [phone] Office #: [phone]

## 2016-03-22 NOTE — Progress Notes (Signed)
Subjective: Slight HA 4/10 but not positional. patient feels it is likely secondary to lack of sleep. S/P 1/3 solumedrol doses.   Exam: Filed Vitals:   03/21/16 2240 03/22/16 0546  BP: 115/73 112/67  Pulse: 75 64  Temp:  98.3 F (36.8 C)  Resp: 18 18       Gen: In bed, NAD MS: alert and oriented CN: 2-12 intact Motor: MAEW Sensory: intact throughout   Pertinent Labs/Diagnostics: No MS lesions noted in the thoracic or cervical cord.   Felicie Morn PA-C [facility] [phone]  Impression: 35 YO female with dysequilibrium/vertigo in the setting of multiple lesions. No enhancing lesions on the cervical or thoracic spine. S/P 2/3 Solumedrol doses. The lesiosn are somewhat limited, but I think that MS is still the most likley diagnosis.    Recommendations: 1) will get last dose of solumedrol tomorrow. Ok to follow up as outpatient from there.  2) will send ana, anca, SSA/B for mimics, though I think all of these are less likely than MS.  3) will need outpatient follow up for consideration of disease modifying agent.   Ritta Slot, MD [facility] [phone]  If 7pm- 7am, please page neurology on call as listed in AMION.  03/22/2016, 10:09 AM

## 2016-03-22 NOTE — Progress Notes (Signed)
PROGRESS NOTE                                                                                                                                                                                                             Patient Demographics:    Virginia [name], is a 35 y.o. female, DOB - 27-Jan-1981, UJW:[id number]  Admit date - 03/19/2016   Admitting Physician Hillary Bow, DO  Outpatient Primary MD for the patient is No PCP Per Patient  LOS - 2  Outpatient Specialists: none  Chief Complaint  Patient presents with  . Loss of Consciousness  . Dizziness  . Emesis  . Diarrhea  . Fatigue       Brief Narrative  35 year old female with history of anxiety presented with three-week history of recurrent vertigo worsened with changing position and neck movement. Symptoms became worse on the day prior to admission. She became unconscious for almost 5 minutes. No postictal changes. No visual course speech impairment. Denied any weakness of the limbs but reports having tingling and some numbness in her bilateral feet for past 3 weeks. MRI brain showed patchy T2-flair hyperintense foci involving bilateral periventricular white matter, most prominent in the suspected of demyelinating disorder.    Subjective:   Still has some dizziness with neck movement and changing posterior. Denies nausea.   Assessment  & Plan :    Principal Problem: White matter abnormality on MRI of brain - Neurology consult appreciated, they Suspect MS.  MRI of the cervical, thoracic  Spine with no enhancing lesions,. Started on high dose IV Solu-Medrol . - LP performed 5/23, glucose and protein within normal limit, 4 white blood cells. -  EEG negative for seizures. - PT evaluation recommends outpatient PT and vestibular rehabilitation. - Continue scheduled meclizine.  Anxiety - Continue Klonopin.        Code Status :Full code  Family  Communication  : None At bedside  Disposition Plan  : home after completion of 3 days of IV steroid    Consults  :  Neurology  Procedures  : MRI brain, spine EEG LP  DVT Prophylaxis  :  Lovenox -  Lab Results  Component Value Date   PLT 335 03/19/2016    Antibiotics  :    Anti-infectives    None  Objective:   Filed Vitals:   03/21/16 1840 03/21/16 2240 03/22/16 0546 03/22/16 1020  BP: 121/64 115/73 112/67 106/47  Pulse: 69 75 64 78  Temp: 97.5 F (36.4 C) 98.1 F (36.7 C) 98.3 F (36.8 C) 98 F (36.7 C)  TempSrc: Oral Oral Oral Oral  Resp: 18 18 18 18   Height:      SpO2: 96% 97% 98% 98%    Wt Readings from Last 3 Encounters:  02/24/16 81.647 kg (180 lb)  10/25/15 92.987 kg (205 lb)  01/24/13 93.441 kg (206 lb)    No intake or output data in the 24 hours ending 03/22/16 1433   Physical Exam  Gen: not in distress HEENT:  moist mucosa, supple neck, no nystagmus  Chest: clear b/l, no added sounds CVS: N S1&S2, no murmurs, rubs or gallop GI: soft, NT, ND, BS+ Musculoskeletal: warm, no edema CNS: AAOX3, non focal    Data Review:    CBC  Recent Labs Lab 03/19/16 1929  WBC 14.2*  HGB 14.5  HCT 42.6  PLT 335  MCV 90.8  MCH 30.9  MCHC 34.0  RDW 12.1  LYMPHSABS 2.1  MONOABS 0.6  EOSABS 0.1  BASOSABS 0.0    Chemistries   Recent Labs Lab 03/19/16 1929  NA 137  K 3.7  CL 110  CO2 18*  GLUCOSE 100*  BUN 16  CREATININE 0.74  CALCIUM 8.9   ------------------------------------------------------------------------------------------------------------------ No results for input(s): CHOL, HDL, LDLCALC, TRIG, CHOLHDL, LDLDIRECT in the last 72 hours.  No results found for: HGBA1C ------------------------------------------------------------------------------------------------------------------ No results for input(s): TSH, T4TOTAL, T3FREE, THYROIDAB in the last 72 hours.  Invalid input(s):  FREET3 ------------------------------------------------------------------------------------------------------------------ No results for input(s): VITAMINB12, FOLATE, FERRITIN, TIBC, IRON, RETICCTPCT in the last 72 hours.  Coagulation profile No results for input(s): INR, PROTIME in the last 168 hours.  No results for input(s): DDIMER in the last 72 hours.  Cardiac Enzymes No results for input(s): CKMB, TROPONINI, MYOGLOBIN in the last 168 hours.  Invalid input(s): CK ------------------------------------------------------------------------------------------------------------------ No results found for: BNP  Inpatient Medications  Scheduled Meds: Continuous Infusions: PRN Meds:.  Micro Results No results found for this or any previous visit (from the past 240 hour(s)).  Radiology Reports Mr Laqueta Jean Wo Contrast  03/20/2016  CLINICAL DATA:  Initial evaluation for acute syncope. EXAM: MRI HEAD WITHOUT AND WITH CONTRAST TECHNIQUE: Multiplanar, multiecho pulse sequences of the brain and surrounding structures were obtained without and with intravenous contrast. CONTRAST:  15mL MULTIHANCE GADOBENATE DIMEGLUMINE 529 MG/ML IV SOLN COMPARISON:  None. FINDINGS: Cerebral volume normal for patient age. Patchy multi focal T2/FLAIR hyperintense foci present within the periventricular, deep, and subcortical white matter of both cerebral hemispheres. These are predominantly located within the bilateral parietal regions. The most prominent of these is positioned within the periventricular white matter of the left parietal region and measures approximately 7 mm (series 6, image 16). This lesion demonstrates central T2 cystic signal intensity with surrounding T2/FLAIR edema. Mildly hyperintense diffusion-weighted signal seen about several of these lesions. Many of these lesions enhance following gadolinium administration. Findings are most consistent with demyelinating disease (multiple sclerosis).  Postcontrast enhancement is consistent with active demyelination. No lesions involving the posterior fossa or brainstem identified. No evidence for acute infarct. Gray-white matter differentiation maintained. Major intracranial vascular flow voids are preserved. Diminutive vertebrobasilar system with possible right trigeminal artery noted. No acute or chronic intracranial hemorrhage. No mass lesion, midline shift, or mass effect. No hydrocephalus. No extra-axial fluid collection. Major dural sinuses  are grossly patent. Incidental note made of a DVA within the right parietal lobe. Craniocervical junction normal. Visualized upper cervical spine unremarkable. No lesions seen within the upper cervical spinal cord. Pituitary gland normal. No acute abnormality about the globes and orbits. Optic nerves grossly unremarkable. Paranasal sinuses are clear. Trace opacity within the right mastoid air cells. Left mastoid air cells are clear. Inner ear structures normal. Diffusely decreased T1 signal intensity within the vertebral body bone marrow, nonspecific, but most commonly related to smoking, anemia, or obesity. No scalp soft tissue abnormality. IMPRESSION: 1. Abnormal patchy T2/FLAIR hyperintense foci involving the periventricular, deep, and subcortical white matter of both cerebral hemispheres, greatest within the parietal regions. Primary differential consideration consists of underlying demyelinating disease (multiple sclerosis). Many of these foci demonstrate postcontrast enhancement, consistent with active demyelination. 2. Diffusely decreased T1 signal intensity within the visualized bone marrow, nonspecific, but most commonly related to anemia, smoking, obesity, or other underlying lymphoproliferative disorder. Electronically Signed   By: Rise Mu M.D.   On: 03/20/2016 03:31   Mr Cervical Spine Wo Contrast  03/22/2016  CLINICAL DATA:  Initial evaluation for EXAM: MRI CERVICAL SPINE WITHOUT CONTRAST  TECHNIQUE: Multiplanar, multisequence MR imaging of the cervical spine was performed. No intravenous contrast was administered. COMPARISON:  Comparison made with previous brain MRI from 03/20/2016. FINDINGS: Alignment: Vertebral bodies are normally aligned with preservation of the normal cervical lordosis. No listhesis or malalignment. Vertebrae: Vertebral body heights are well maintained. No acute, subacute, or chronic fracture. Signal intensity within the vertebral body bone marrow is diffusely decreased on T1 signal intensity, similar to that seen on previous brain MRI. Again, this is nonspecific, but can be seen with underlying smoking, anemia, obesity, or other limb for PLIF Ritter disorder. No focal osseous lesions. No marrow edema. Cord: Signal intensity within the cervical spinal cord is normal. No T2 hyperintense lesions to suggest underlying demyelinating disease identified. No cord atrophy identified. Posterior Fossa, vertebral arteries, paraspinal tissues: Paraspinous soft tissues within normal limits. No prevertebral edema. Normal intravascular flow voids present within the vertebral arteries bilaterally. Disc levels: C2-C3: Negative. C3-C4:  Negative. C4-C5: Central disc protrusion indents the ventral thecal sac, abutting the cervical spinal cord with minimal cord flattening. No cord signal changes. Resultant mild canal stenosis. Mild superimposed bilateral uncovertebral spurring without significant foraminal stenosis. C5-C6: Small central disc protrusion indents the ventral thecal sac with mild cord flattening. No cord signal changes. Resultant mild canal stenosis. Mild superimposed bilateral uncovertebral spurring without significant foraminal stenosis. C6-C7: Broad-based posterior disc protrusion indents the ventral thecal sac and flattens the cervical spinal cord. No cord signal changes. Bulging disc slightly eccentric to the right. Mild canal stenosis. Superimposed bilateral uncovertebral  spurring. Resultant moderate right foraminal stenosis. More mild left foraminal narrowing. C7-T1:  Negative. Visualized upper thoracic spine within normal limits. IMPRESSION: 1. Normal MRI appearance of the cervical spinal cord. No focal lesions to suggest underlying demyelinating disease. 2. Central disc protrusions at C4-5, C5-6, and C6-7 with resultant mild canal stenosis. 3. Moderate right with mild left foraminal stenosis at C6-7 related to disc bulge and uncovertebral spurring. No other significant foraminal narrowing within the cervical spine. Electronically Signed   By: Rise Mu M.D.   On: 03/22/2016 03:56   Mr Thoracic Spine Wo Contrast  03/22/2016  CLINICAL DATA:  Initial evaluation for acute syncope, dizziness. Concern for multiple sclerosis on previous brain MRI. EXAM: MRI THORACIC SPINE WITHOUT CONTRAST TECHNIQUE: Multiplanar, multisequence MR imaging of the thoracic spine was  performed. No intravenous contrast was administered. COMPARISON:  Prior brain MRI from 03/20/2016. FINDINGS: Alignment: Vertebral bodies are normally aligned with preservation of the normal thoracic kyphosis. No listhesis or malalignment. Vertebrae: Vertebral body height is well maintained. No evidence for acute, subacute, or chronic fracture. Signal intensity within the vertebral body bone marrow is diffusely decreased on T1 weighted sequences, similar to previous studies. Again, this is nonspecific, but most commonly related to anemia, smoking, or obesity. Possible lymphoproliferative disorder could also be considered. No focal osseous lesions. No marrow edema. Cord: Signal intensity within the thoracic spinal cord is normal. No focal lesions to suggest underlying demyelinating disease identified. Conus medullaris terminates at the T12-L1 level. No significant cord atrophy. Paraspinal and other soft tissues: Paraspinal soft tissues are normal in appearance without acute abnormality. Partially visualized lungs are  clear. Visualized aorta is normal in caliber and appearance. Disc levels: No significant degenerative changes identified within the thoracic spine. No disc bulge or focal disc herniation. No canal or foraminal stenosis. Degenerative disc bulge noted at C6-7 with associated mild canal stenosis, better evaluated on concomitant MRI of the cervical spine. IMPRESSION: 1. Normal MRI appearance of the thoracic spinal cord. No focal lesions to suggest underlying demyelinating disease or other process identified. 2. No significant degenerative changes within the thoracic spine. No disc herniation. No canal or foraminal stenosis. Electronically Signed   By: Rise Mu M.D.   On: 03/22/2016 04:19    Time Spent in minutes  25 minutes   ELGERGAWY, DAWOOD M.D on 03/22/2016 at 2:33 PM Pager [phone]  Between 7am to 7pm - Pager - [phone]  After 7pm go to [email or web address] - password [facility]  [facility] -  Office  [phone]

## 2016-03-22 NOTE — Progress Notes (Deleted)
Pt arrived to 5M11 @ 1530, Pt A&Ox 4, c/o pain 9/10. Dressing CDI, JP drain. Pt VS taken, pt on 2L O2 from PACU, Fluids runningat 75 cc/hr. Foley intact, unclamped. Pt without distress. Family at the bedside. Diet ordered, will monitor. 

## 2016-03-23 ENCOUNTER — Encounter (HOSPITAL_COMMUNITY): Payer: Self-pay | Admitting: Radiology

## 2016-03-23 ENCOUNTER — Inpatient Hospital Stay (HOSPITAL_COMMUNITY): Payer: Managed Care, Other (non HMO)

## 2016-03-23 LAB — ANCA TITERS
C-ANCA: <1:20 {titer}
P-ANCA: <1:20 {titer}

## 2016-03-23 LAB — ANTINUCLEAR ANTIBODIES, IFA: ANTINUCLEAR ANTIBODIES, IFA: NEGATIVE

## 2016-03-23 LAB — GLUCOSE, CAPILLARY: GLUCOSE-CAPILLARY: 128 mg/dL — AB (ref 65–99)

## 2016-03-23 LAB — SJOGRENS SYNDROME-A EXTRACTABLE NUCLEAR ANTIBODY: SSA (Ro) (ENA) Antibody, IgG: <0.2 AI (ref 0.0–0.9)

## 2016-03-23 LAB — JC VIRUS, PCR CSF: JC Virus PCR, CSF: NEGATIVE

## 2016-03-23 LAB — OLIGOCLONAL BANDS, CSF + SERM

## 2016-03-23 LAB — SJOGRENS SYNDROME-B EXTRACTABLE NUCLEAR ANTIBODY: SSB (La) (ENA) Antibody, IgG: <0.2 AI (ref 0.0–0.9)

## 2016-03-23 MED ORDER — IOPAMIDOL (ISOVUE-370) INJECTION 76%
INTRAVENOUS | Status: AC
  Administered 2016-03-23: 50 mL
  Filled 2016-03-23: qty 50

## 2016-03-23 MED ORDER — METOCLOPRAMIDE HCL 5 MG/ML IJ SOLN
10.0000 mg | Freq: Once | INTRAMUSCULAR | Status: AC
  Administered 2016-03-23: 10 mg via INTRAVENOUS
  Filled 2016-03-23: qty 2

## 2016-03-23 MED ORDER — KETOROLAC TROMETHAMINE 30 MG/ML IJ SOLN
30.0000 mg | Freq: Once | INTRAMUSCULAR | Status: AC
  Administered 2016-03-23: 30 mg via INTRAVENOUS
  Filled 2016-03-23: qty 1

## 2016-03-23 NOTE — Progress Notes (Signed)
.  Discharge orders received, Pt for discharge home today. IV d/c'd. D/c instructions  given with verbalized understanding. Family at bedside to assist patient with discharge. Staff bought pt downstairs via wheelchair.03/25/16 1831

## 2016-03-23 NOTE — Progress Notes (Signed)
Subjective: Continues to complain of headache, photophobic.   Exam: Filed Vitals:   03/23/16 0202 03/23/16 0625  BP: 120/68 128/69  Pulse: 65 76  Temp: 97.5 F (36.4 C) 98.1 F (36.7 C)  Resp: 18 18     Gen: In bed, NAD MS: alert and oriented CN: 2-12 intact Motor: MAEW Sensory: intact throughout   Pertinent Labs/Diagnostics: No MS lesions noted in the thoracic or cervical cord.   Felicie Morn PA-C [facility] [phone]  Impression: 35 YO female with dysequilibrium/vertigo in the setting of multiple lesions. No enhancing lesions on the cervical or thoracic spine. S/P 2/3 Solumedrol doses. The lesions are somewhat limited, but I think that MS is still the most likley diagnosis. One thing I do note is that all but one appear to be posterior circulation. I would favor getting a CT angio to make sure that the enhancing lesions are not actually subacute infarcts.    Recommendations: 1) Last dose of solumedrol today.  2) CT angio head and neck.  3) if CT angio is negative, then can follow up as an outpatient.   Ritta Slot, MD [facility] [phone]  If 7pm- 7am, please page neurology on call as listed in AMION.  03/23/2016, 11:36 AM

## 2016-03-23 NOTE — Progress Notes (Signed)
Physical Therapy Treatment Patient Details Name: Virginia [name] MRN: [id number] DOB: 1981-04-24 Today's Date: 03/23/2016    History of Present Illness Virginia [name] is a 35 y.o. female with a hx of anxiety, syncope presents to the Emergency Department complaining of Worsening dizziness for the last several weeks. Pt with reports of full syncopal episode when bending over to pick up something off floor.    PT Comments    Patient continues with dizziness which presents as possible L posterior canal BPPV. Treated today with Eply CRT, but still not sure if cleared or even if her dizziness is unrelated to her MRI changes.  Balance testing demonstrates low risk for falls (56/56 on Berg and 20/24 on DGI).  Agree with follow up outpatient vestibular rehab.  Follow Up Recommendations  Outpatient PT (for vestibular rehab)     Equipment Recommendations  None recommended by PT    Recommendations for Other Services       Precautions / Restrictions Precautions Precautions: None    Mobility  Bed Mobility Overal bed mobility: Independent                Transfers Overall transfer level: Independent   Transfers: Sit to/from Stand Sit to Stand: Independent            Ambulation/Gait Ambulation/Gait assistance: Independent Ambulation Distance (Feet): 300 Feet Assistive device: None Gait Pattern/deviations: Step-through pattern;WFL(Within Functional Limits)         Stairs Stairs: Yes Stairs assistance: Modified independent (Device/Increase time) Stair Management: Two rails;Alternating pattern Number of Stairs: 3    Wheelchair Mobility    Modified Rankin (Stroke Patients Only)       Balance                                    Cognition Arousal/Alertness: Awake/alert Behavior During Therapy: WFL for tasks assessed/performed Overall Cognitive Status: Within Functional Limits for tasks assessed                      Exercises       General Comments General comments (skin integrity, edema, etc.): Performed supine head roll without symptoms or nystagmus, R modified hall pike negative for symptoms or nystagmus; L modified hall pike positive for symptoms and brief rotary nystagmus; reports has taken Antivert today.  Performed Eply's canalith repositioning for L post canal BPPV.  Noted continued symptoms in final turn of CRT as well as some very small, brisk R rotary nystagmus lasting about 40 sec with head turned to R.  Noted normal smooth pursuits, saccades, VOR and VOR cancellation and intact coordination. Complained of nausea with VOR and had 5/10 dizziness walking iwth head turns 1/10 walking normally.      Pertinent Vitals/Pain Pain Assessment: No/denies pain    Home Living                      Prior Function            PT Goals (current goals can now be found in the care plan section) Progress towards PT goals: Progressing toward goals    Frequency  Min 4X/week    PT Plan Current plan remains appropriate    Co-evaluation             End of Session Equipment Utilized During Treatment: Gait belt Activity Tolerance: Patient tolerated treatment well Patient left: in bed;with call bell/phone within  reach     Time: 1120-1155 PT Time Calculation (min) (ACUTE ONLY): 35 min  Charges:  $Neuromuscular Re-education: 8-22 mins $Canalith Rep Proc: 8-22 mins                    G Codes:      Virginia [name] 04-14-16, 1:24 PM  Virginia [name],  [phone] 04/14/2016

## 2016-03-23 NOTE — Discharge Instructions (Signed)
Follow with Primary MD  in 7 days   Get CBC, CMP, 2 view Chest X ray checked  by Primary MD next visit.    Activity: As tolerated with Full fall precautions use walker/cane & assistance as needed   Disposition Home    Diet: Regular diet , with feeding assistance and aspiration precautions.    On your next visit with your primary care physician please Get Medicines reviewed and adjusted.   Please request your Prim.MD to go over all Hospital Tests and Procedure/Radiological results at the follow up, please get all Hospital records sent to your Prim MD by signing hospital release before you go home.   If you experience worsening of your admission symptoms, develop shortness of breath, life threatening emergency, suicidal or homicidal thoughts you must seek medical attention immediately by calling 911 or calling your MD immediately  if symptoms less severe.  You Must read complete instructions/literature along with all the possible adverse reactions/side effects for all the Medicines you take and that have been prescribed to you. Take any new Medicines after you have completely understood and accpet all the possible adverse reactions/side effects.   Do not drive, operating heavy machinery, perform activities at heights, swimming or participation in water activities or provide baby sitting services if your were admitted for syncope or siezures until you have seen by Primary MD or a Neurologist and advised to do so again.  Do not drive when taking Pain medications.    Do not take more than prescribed Pain, Sleep and Anxiety Medications  Special Instructions: If you have smoked or chewed Tobacco  in the last 2 yrs please stop smoking, stop any regular Alcohol  and or any Recreational drug use.  Wear Seat belts while driving.   Please note  You were cared for by a hospitalist during your hospital stay. If you have any questions about your discharge medications or the care you received  while you were in the hospital after you are discharged, you can call the unit and asked to speak with the hospitalist on call if the hospitalist that took care of you is not available. Once you are discharged, your primary care physician will handle any further medical issues. Please note that NO REFILLS for any discharge medications will be authorized once you are discharged, as it is imperative that you return to your primary care physician (or establish a relationship with a primary care physician if you do not have one) for your aftercare needs so that they can reassess your need for medications and monitor your lab values.

## 2016-03-23 NOTE — Discharge Summary (Addendum)
Kazue Cerro, is a 35 y.o. female  DOB 1980-11-10  MRN [id number].  Admission date:  03/19/2016  Admitting Physician  Hillary Bow, DO  Discharge Date:  03/23/2016   Primary MD  No PCP Per Patient  Recommendations for primary care physician for things to follow:  - Patient to follow with neurology as an outpatient  Admission Diagnosis  Syncope [R55] Vertigo [R42] MS (multiple sclerosis) (HCC) [G35]   Discharge Diagnosis  Syncope [R55] Vertigo [R42] MS (multiple sclerosis) (HCC) [G35]    Principal Problem:   White matter abnormality on MRI of brain Active Problems:   Multiple sclerosis (HCC)   MS (multiple sclerosis) (HCC)   Vertigo      Past Medical History  Diagnosis Date  . Anxiety     History reviewed. No pertinent past surgical history.     History of present illness and  Hospital Course:     Kindly see H&P for history of present illness and admission details, please review complete Labs, Consult reports and Test reports for all details in brief  HPI  from the history and physical done on the day of admission 5/22  HPI: Infinity Weismann is a 35 y.o. female with medical history significant of previously healthy. Patient presents to the ED with c/o worsening dizziness and vertigo for the last several weeks. Today was even worse and she fell against a wall due to lack of ballance when getting laundry out of basket this afternoon. She attempted to walk down the hall but had a full syncopal episode. She also reports 2 episodes of NBNB emesis over the course of the day. Did have a tick bite some 3 weeks ago. ED Course: MRI brain performed which shows multiple white matter abnormalities that appear to be c/w demyelinating lesions, some of these are active.  Hospital Course  35 year old female with history of anxiety presented with three-week history of recurrent vertigo worsened with  changing position and neck movement. Symptoms became worse on the day prior to admission. She became unconscious for almost 5 minutes. No postictal changes. No visual course speech impairment. Denied any weakness of the limbs but reports having tingling and some numbness in her bilateral feet for past 3 weeks. MRI brain showed patchy T2-flair hyperintense foci involving bilateral periventricular white matter, most prominent in the suspected of demyelinating disorder.  White matter abnormality on MRI of brain - Neurology consult appreciated, they Suspect MS. MRI of the cervical, thoracic Spine with no enhancing lesions,. Started on high dose IV Solu-Medrol , received total of 3 days, to follow up with neurology as an outpatient - LP performed 5/23, glucose and protein within normal limit, 4 white blood cells, oligoclonal bands, IgG pending at time of discharge, this to be followed by neurology as an outpatient - EEG negative for seizures. - CTA head and neck was obtained by neuro, No significant stenosis. - Outpatient PT for vestibular rehabilitation  Anxiety - Continue Klonopin.  Discharge Condition:  Stable    Follow UP  Neurology  as  Outpatient, ambulatory referral to neurology been done by neuro service     Discharge Instructions  and  Discharge Medications     Discharge Instructions    Ambulatory referral to Neurology    Complete by:  As directed   An appointment is requested in approximately: 1- 2 weeks     Discharge instructions    Complete by:  As directed   Follow with Primary MD  in 7 days   Get CBC, CMP, 2 view Chest X ray checked  by Primary MD next visit.    Activity: As tolerated with Full fall precautions use walker/cane & assistance as needed   Disposition Home    Diet: Regular diet , with feeding assistance and aspiration precautions.    On your next visit with your primary care physician please Get Medicines reviewed and adjusted.   Please request  your Prim.MD to go over all Hospital Tests and Procedure/Radiological results at the follow up, please get all Hospital records sent to your Prim MD by signing hospital release before you go home.   If you experience worsening of your admission symptoms, develop shortness of breath, life threatening emergency, suicidal or homicidal thoughts you must seek medical attention immediately by calling 911 or calling your MD immediately  if symptoms less severe.  You Must read complete instructions/literature along with all the possible adverse reactions/side effects for all the Medicines you take and that have been prescribed to you. Take any new Medicines after you have completely understood and accpet all the possible adverse reactions/side effects.   Do not drive, operating heavy machinery, perform activities at heights, swimming or participation in water activities or provide baby sitting services if your were admitted for syncope or siezures until you have seen by Primary MD or a Neurologist and advised to do so again.  Do not drive when taking Pain medications.    Do not take more than prescribed Pain, Sleep and Anxiety Medications  Special Instructions: If you have smoked or chewed Tobacco  in the last 2 yrs please stop smoking, stop any regular Alcohol  and or any Recreational drug use.  Wear Seat belts while driving.   Please note  You were cared for by a hospitalist during your hospital stay. If you have any questions about your discharge medications or the care you received while you were in the hospital after you are discharged, you can call the unit and asked to speak with the hospitalist on call if the hospitalist that took care of you is not available. Once you are discharged, your primary care physician will handle any further medical issues. Please note that NO REFILLS for any discharge medications will be authorized once you are discharged, as it is imperative that you return to your  primary care physician (or establish a relationship with a primary care physician if you do not have one) for your aftercare needs so that they can reassess your need for medications and monitor your lab values     Increase activity slowly    Complete by:  As directed             Medication List    TAKE these medications        clonazePAM 0.5 MG tablet  Commonly known as:  KLONOPIN  Take 0.25 mg by mouth at bedtime.     escitalopram 10 MG tablet  Commonly known as:  LEXAPRO  Take 10 mg by mouth daily.  naproxen sodium 220 MG tablet  Commonly known as:  ANAPROX  Take 220 mg by mouth 2 (two) times daily as needed (for pain).          Diet and Activity recommendation: See Discharge Instructions above   Consults obtained -  Neurology    Major procedures and Radiology Reports - PLEASE review detailed and final reports for all details, in brief -   LP   Mr Laqueta Jean Wo Contrast  03/20/2016  CLINICAL DATA:  Initial evaluation for acute syncope. EXAM: MRI HEAD WITHOUT AND WITH CONTRAST TECHNIQUE: Multiplanar, multiecho pulse sequences of the brain and surrounding structures were obtained without and with intravenous contrast. CONTRAST:  15mL MULTIHANCE GADOBENATE DIMEGLUMINE 529 MG/ML IV SOLN COMPARISON:  None. FINDINGS: Cerebral volume normal for patient age. Patchy multi focal T2/FLAIR hyperintense foci present within the periventricular, deep, and subcortical white matter of both cerebral hemispheres. These are predominantly located within the bilateral parietal regions. The most prominent of these is positioned within the periventricular white matter of the left parietal region and measures approximately 7 mm (series 6, image 16). This lesion demonstrates central T2 cystic signal intensity with surrounding T2/FLAIR edema. Mildly hyperintense diffusion-weighted signal seen about several of these lesions. Many of these lesions enhance following gadolinium administration. Findings  are most consistent with demyelinating disease (multiple sclerosis). Postcontrast enhancement is consistent with active demyelination. No lesions involving the posterior fossa or brainstem identified. No evidence for acute infarct. Gray-white matter differentiation maintained. Major intracranial vascular flow voids are preserved. Diminutive vertebrobasilar system with possible right trigeminal artery noted. No acute or chronic intracranial hemorrhage. No mass lesion, midline shift, or mass effect. No hydrocephalus. No extra-axial fluid collection. Major dural sinuses are grossly patent. Incidental note made of a DVA within the right parietal lobe. Craniocervical junction normal. Visualized upper cervical spine unremarkable. No lesions seen within the upper cervical spinal cord. Pituitary gland normal. No acute abnormality about the globes and orbits. Optic nerves grossly unremarkable. Paranasal sinuses are clear. Trace opacity within the right mastoid air cells. Left mastoid air cells are clear. Inner ear structures normal. Diffusely decreased T1 signal intensity within the vertebral body bone marrow, nonspecific, but most commonly related to smoking, anemia, or obesity. No scalp soft tissue abnormality. IMPRESSION: 1. Abnormal patchy T2/FLAIR hyperintense foci involving the periventricular, deep, and subcortical white matter of both cerebral hemispheres, greatest within the parietal regions. Primary differential consideration consists of underlying demyelinating disease (multiple sclerosis). Many of these foci demonstrate postcontrast enhancement, consistent with active demyelination. 2. Diffusely decreased T1 signal intensity within the visualized bone marrow, nonspecific, but most commonly related to anemia, smoking, obesity, or other underlying lymphoproliferative disorder. Electronically Signed   By: Rise Mu M.D.   On: 03/20/2016 03:31   Mr Cervical Spine Wo Contrast  03/22/2016  CLINICAL DATA:   Initial evaluation for EXAM: MRI CERVICAL SPINE WITHOUT CONTRAST TECHNIQUE: Multiplanar, multisequence MR imaging of the cervical spine was performed. No intravenous contrast was administered. COMPARISON:  Comparison made with previous brain MRI from 03/20/2016. FINDINGS: Alignment: Vertebral bodies are normally aligned with preservation of the normal cervical lordosis. No listhesis or malalignment. Vertebrae: Vertebral body heights are well maintained. No acute, subacute, or chronic fracture. Signal intensity within the vertebral body bone marrow is diffusely decreased on T1 signal intensity, similar to that seen on previous brain MRI. Again, this is nonspecific, but can be seen with underlying smoking, anemia, obesity, or other limb for PLIF Ritter disorder. No focal osseous lesions. No  marrow edema. Cord: Signal intensity within the cervical spinal cord is normal. No T2 hyperintense lesions to suggest underlying demyelinating disease identified. No cord atrophy identified. Posterior Fossa, vertebral arteries, paraspinal tissues: Paraspinous soft tissues within normal limits. No prevertebral edema. Normal intravascular flow voids present within the vertebral arteries bilaterally. Disc levels: C2-C3: Negative. C3-C4:  Negative. C4-C5: Central disc protrusion indents the ventral thecal sac, abutting the cervical spinal cord with minimal cord flattening. No cord signal changes. Resultant mild canal stenosis. Mild superimposed bilateral uncovertebral spurring without significant foraminal stenosis. C5-C6: Small central disc protrusion indents the ventral thecal sac with mild cord flattening. No cord signal changes. Resultant mild canal stenosis. Mild superimposed bilateral uncovertebral spurring without significant foraminal stenosis. C6-C7: Broad-based posterior disc protrusion indents the ventral thecal sac and flattens the cervical spinal cord. No cord signal changes. Bulging disc slightly eccentric to the right.  Mild canal stenosis. Superimposed bilateral uncovertebral spurring. Resultant moderate right foraminal stenosis. More mild left foraminal narrowing. C7-T1:  Negative. Visualized upper thoracic spine within normal limits. IMPRESSION: 1. Normal MRI appearance of the cervical spinal cord. No focal lesions to suggest underlying demyelinating disease. 2. Central disc protrusions at C4-5, C5-6, and C6-7 with resultant mild canal stenosis. 3. Moderate right with mild left foraminal stenosis at C6-7 related to disc bulge and uncovertebral spurring. No other significant foraminal narrowing within the cervical spine. Electronically Signed   By: Rise Mu M.D.   On: 03/22/2016 03:56   Mr Thoracic Spine Wo Contrast  03/22/2016  CLINICAL DATA:  Initial evaluation for acute syncope, dizziness. Concern for multiple sclerosis on previous brain MRI. EXAM: MRI THORACIC SPINE WITHOUT CONTRAST TECHNIQUE: Multiplanar, multisequence MR imaging of the thoracic spine was performed. No intravenous contrast was administered. COMPARISON:  Prior brain MRI from 03/20/2016. FINDINGS: Alignment: Vertebral bodies are normally aligned with preservation of the normal thoracic kyphosis. No listhesis or malalignment. Vertebrae: Vertebral body height is well maintained. No evidence for acute, subacute, or chronic fracture. Signal intensity within the vertebral body bone marrow is diffusely decreased on T1 weighted sequences, similar to previous studies. Again, this is nonspecific, but most commonly related to anemia, smoking, or obesity. Possible lymphoproliferative disorder could also be considered. No focal osseous lesions. No marrow edema. Cord: Signal intensity within the thoracic spinal cord is normal. No focal lesions to suggest underlying demyelinating disease identified. Conus medullaris terminates at the T12-L1 level. No significant cord atrophy. Paraspinal and other soft tissues: Paraspinal soft tissues are normal in appearance  without acute abnormality. Partially visualized lungs are clear. Visualized aorta is normal in caliber and appearance. Disc levels: No significant degenerative changes identified within the thoracic spine. No disc bulge or focal disc herniation. No canal or foraminal stenosis. Degenerative disc bulge noted at C6-7 with associated mild canal stenosis, better evaluated on concomitant MRI of the cervical spine. IMPRESSION: 1. Normal MRI appearance of the thoracic spinal cord. No focal lesions to suggest underlying demyelinating disease or other process identified. 2. No significant degenerative changes within the thoracic spine. No disc herniation. No canal or foraminal stenosis. Electronically Signed   By: Rise Mu M.D.   On: 03/22/2016 04:19    Micro Results    No results found for this or any previous visit (from the past 240 hour(s)).     Today   Subjective:   Aleyna Murtagh today has no chest or abdominal pain, feels much better wants to go home today., Reports mild headache   Objective:   Blood pressure  128/69, pulse 76, temperature 98.1 F (36.7 C), temperature source Oral, resp. rate 18, height 5\' 4"  (1.626 m), last menstrual period 02/23/2016, SpO2 98 %.  No intake or output data in the 24 hours ending 03/23/16 1356  Exam Awake Alert, Oriented x 3, No new F.N deficits, Normal affect [place].AT,PERRAL Supple Neck,No JVD, No cervical lymphadenopathy appriciated.  Symmetrical Chest wall movement, Good air movement bilaterally, CTAB RRR,No Gallops,Rubs or new Murmurs, No Parasternal Heave +ve B.Sounds, Abd Soft, Non tender, No organomegaly appriciated, No rebound -guarding or rigidity. No Cyanosis, Clubbing or edema, No new Rash or bruise  Data Review   CBC w Diff:  Lab Results  Component Value Date   WBC 14.2* 03/19/2016   HGB 14.5 03/19/2016   HCT 42.6 03/19/2016   PLT 335 03/19/2016   LYMPHOPCT 15 03/19/2016   MONOPCT 4 03/19/2016   EOSPCT 0 03/19/2016    BASOPCT 0 03/19/2016    CMP:  Lab Results  Component Value Date   NA 137 03/19/2016   K 3.7 03/19/2016   CL 110 03/19/2016   CO2 18* 03/19/2016   BUN 16 03/19/2016   CREATININE 0.74 03/19/2016   PROT 6.7 02/24/2016   ALBUMIN 3.8 02/24/2016   BILITOT 0.4 02/24/2016   ALKPHOS 61 02/24/2016   AST 12* 02/24/2016   ALT 13* 02/24/2016  .   Total Time in preparing paper work, data evaluation and todays exam - 35 minutes  Ronda Kazmi M.D on 03/23/2016 at 1:56 PM  [facility]   Office  [phone]

## 2016-03-23 NOTE — Care Management Note (Signed)
Case Management Note  Patient Details  Name: Solita Sorey MRN: [id number] Date of Birth: 08/30/1981  Subjective/Objective:                    Action/Plan: Patient discharging home with self care. Orders for outpatient PT. CM met with the patient and even though she lives in [place] she wants to come to the [facility]. Orders in EPIC and information on the AVS.   Expected Discharge Date:                  Expected Discharge Plan:  Home/Self Care  In-House Referral:     Discharge planning Services  CM Consult  Post Acute Care Choice:    Choice offered to:     DME Arranged:    DME Agency:     HH Arranged:    HH Agency:     Status of Service:  Completed, signed off  Medicare Important Message Given:    Date Medicare IM Given:    Medicare IM give by:    Date Additional Medicare IM Given:    Additional Medicare Important Message give by:     If discussed at Long Length of Stay Meetings, dates discussed:    Additional Comments:   F , RN 03/23/2016, 2:44 PM  

## 2016-03-27 DIAGNOSIS — R42 Dizziness and giddiness: Secondary | ICD-10-CM | POA: Insufficient documentation

## 2016-03-27 DIAGNOSIS — R55 Syncope and collapse: Secondary | ICD-10-CM | POA: Insufficient documentation

## 2016-03-28 LAB — IGG CSF INDEX
ALBUMIN: 4.1 g/dL (ref 3.5–5.5)
Albumin CSF-mCnc: 23 mg/dL (ref 11–48)
CSF IgG Index: 0.6 (ref 0.0–0.7)
IgG (Immunoglobin G), Serum: 991 mg/dL (ref 700–1600)
IgG, CSF: 3.1 mg/dL (ref 0.0–8.6)
IgG/Alb Ratio, CSF: 0.13 (ref 0.00–0.25)

## 2016-03-29 ENCOUNTER — Ambulatory Visit
Payer: Managed Care, Other (non HMO) | Attending: Internal Medicine | Admitting: Rehabilitative and Restorative Service Providers"

## 2016-03-29 DIAGNOSIS — R42 Dizziness and giddiness: Secondary | ICD-10-CM | POA: Diagnosis present

## 2016-03-29 DIAGNOSIS — R2689 Other abnormalities of gait and mobility: Secondary | ICD-10-CM | POA: Diagnosis present

## 2016-03-29 NOTE — Patient Instructions (Addendum)
  Gaze Stabilization: Tip Card 1.Target must remain in focus, not blurry, and appear stationary while head is in motion. 2.Perform exercises with small head movements (45 to either side of midline). 3.Increase speed of head motion so long as target is in focus. 4.If you wear eyeglasses, be sure you can see target through lens (therapist will give specific instructions for bifocal / progressive lenses). 5.These exercises may provoke dizziness or nausea. Work through these symptoms. If too dizzy, slow head movement slightly. Rest between each exercise. 6.Exercises demand concentration; avoid distractions. 7.For safety, perform standing exercises close to a counter, wall, corner, or next to someone.  Copyright  VHI. All rights reserved.  Gaze Stabilization: Standing Feet Apart   Feet shoulder width apart, keeping eyes on target on wall 3 feet away, tilt head down slightly and move head side to side for 10 repetitions.  Rest. Repeat while moving head up and down for 10 repetitions. Do 3 sessions per day.   Copyright  VHI. All rights reserved.   Special Instructions: Exercises may bring on mild to moderate symptoms of dizziness, nausea, headache that resolve within 30 minutes of completing exercises. If symptoms are lasting longer than 30 minutes, modify your exercises by:  >decreasing the # of times you complete each activity >ensuring your symptoms return to baseline before moving onto the next exercise >dividing up exercises so you do not do them all in one session, but multiple short sessions throughout the day >doing them once a day until symptoms improve

## 2016-03-29 NOTE — Therapy (Signed)
[facility] [address] [place], [place], [address] Phone: [phone]   Fax:  [phone]  Physical Therapy Evaluation  Patient Details  Name: Virginia [name] MRN: [id number] Date of Birth: 1981-05-23 Referring Provider: Despina Arias, MD  Encounter Date: 03/29/2016      PT End of Session - 03/29/16 1330    Visit Number 1   Number of Visits 12   Date for PT Re-Evaluation 05/28/16   Authorization Type [employer]ign[employer]   PT Start Time 1235   PT Stop Time 1320   PT Time Calculation (min) 45 min   Activity Tolerance Patient tolerated treatment well   Behavior During Therapy [facility] for tasks assessed/performed      Past Medical History  Diagnosis Date  . Anxiety     No past surgical history on file.  There were no vitals filed for this visit.       Subjective Assessment - 03/29/16 1236    Subjective The patient reports that her balance has been "off" x 1 month describing "dizziness and lightheadedness" and had an episode of near syncope in which things "started getting dark".  She reports that she presented to the ED due to dizziness, vertigo, n/v, and imbalance.  The symptoms are constant with patient noting "I'm always dizzy" and current baseine is 4/10.  She reports overall "soreness" in shoulders and neck.  She is currently out of work on medical leave due to recent symptoms.     Patient Stated Goals "Everyday things".  I'm scared to drive, go out of the house for long periods, or to work (doesn't want vertigo spell to happen at work).    Currently in Pain? No/denies            [facility] PT Assessment - 03/29/16 1240    Assessment   Medical Diagnosis recent dx of MS, vertigo   Referring Provider Despina Arias, MD   Onset Date/Surgical Date 03/19/16   Hand Dominance Right   Next MD Visit 03/31/2016 establishing with Dr. Epimenio Foot   Prior Therapy acute care   Precautions   Precautions Fall   Restrictions   Weight Bearing  Restrictions No   Balance Screen   Has the patient fallen in the past 6 months No   Has the patient had a decrease in activity level because of a fear of falling?  No   Is the patient reluctant to leave their home because of a fear of falling?  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   Ambulation/Gait   Ambulation/Gait Yes   Ambulation/Gait Assistance 7: Independent   Assistive device None   Gait Pattern --  veers from midline with head motion   Ambulation Surface Level   Gait velocity 3.27 ft/sec   Gait Comments Gait x 20 feet with vertical head turns provoked imbalance with 6/10 symproms and horizontal provoked imbalance with 8/10 symptoms.    Balance   Balance Assessed Yes   Static  Standing Balance   Static Standing - Balance Support No upper extremity supported   Static Standing - Comment/# of Minutes Feet together + eyes closed x 7 seconds, L single limb stance 5 seconds, R single limb stance 10 seconds, tandem stance eyes open x 30 seconds each foot forward            Vestibular Assessment - 03/29/16 1244    Vestibular Assessment   General Observation Patient walks into clinic without assistive device independently.  She describes overall sensation of being similar to dram in which you feel like you are falling.  Baseline dizziness 4/10.   Symptom Behavior   Type of Dizziness Imbalance  sensation of being pulled over, blurred vision worse turning   Frequency of Dizziness daily   Duration of Dizziness  constant   Aggravating Factors Turning body quickly;Turning head quickly   Relieving Factors No known relieving factors   Occulomotor Exam   Occulomotor Alignment Normal   Spontaneous Absent   Gaze-induced Absent  reports blurriness at end range   Smooth Pursuits Intact   Saccades Intact   Vestibulo-Occular Reflex   VOR 1 Head Only (x 1 viewing) x 5 reps at slow pace provokes worsening sensation of dizziness, still rated 4/10.     VOR Cancellation Normal  however provokes 5/10 symptoms   Comment Head impulse test positive bilaterally with nausea noted and 4/10 symptoms.  Patient had refixation saccade that was equal in speed/amplitude to both sides.     Visual Acuity   Static line 10   Dynamic line 4  6 line difference with dynamic visual acuity   Positional Testing   Dix-Hallpike Dix-Hallpike Right;Dix-Hallpike Left   Sidelying Test Sidelying Right;Sidelying Left   Horizontal Canal Testing Horizontal Canal Right;Horizontal Canal Left   Dix-Hallpike Right   Dix-Hallpike Right Duration constant sensation of head fullness   Dix-Hallpike Right Symptoms No nystagmus   Dix-Hallpike Left   Dix-Hallpike Left Duration none   Dix-Hallpike Left Symptoms No nystagmus   Sidelying Right   Sidelying Right Duration constant sensation of "cotton in my head" rated 7/10   Sidelying Right Symptoms No nystagmus   Sidelying Left   Sidelying Left Duration constant sensation of "cotton in head" rated 4/10   Sidelying Left Symptoms No nystagmus   Horizontal Canal Right   Horizontal Canal Right Duration constant sensation of "cotton in my head"   Horizontal Canal Right Symptoms Normal   Horizontal Canal Left   Horizontal Canal Left Duration sensation of visual floaters   Horizontal Canal Left Symptoms Normal                Vestibular Treatment/Exercise - 03/29/16 1310    Vestibular Treatment/Exercise   Vestibular Treatment Provided Gaze   Gaze Exercises X1 Viewing Horizontal;X1 Viewing  Vertical   X1 Viewing Horizontal   Foot Position standing feet apart   Comments x 10-12 reps with dizziness 5/10, provided for HEP   X1 Viewing Vertical   Foot Position standing feet apart   Comments x 10 reps                PT Education - 03/29/16 1316    Education provided Yes   Education Details HEP: gaze x 1 horiz/vertical   Person(s) Educated Patient   Methods Explanation;Demonstration;Handout   Comprehension Verbalized understanding;Returned demonstration          PT Short Term Goals - 03/29/16 1335    PT SHORT TERM GOAL #1   Title The  patient will be indep with HEP for gaze adaptation and habituation.    Baseline Target date 04/28/2016   Time 4   Period Weeks   PT SHORT TERM GOAL #2   Title The patient will tolerate gaze x 1 viewing x 30 seconds with no increase in dizziness from baseline.   Baseline Target date 04/28/2016   Time 4   Period Weeks   PT SHORT TERM GOAL #3   Title The patient will be further assessed on SOT and DGI and goals to follow, if indicated.   Baseline Target date 04/28/2016   Time 4   Period Weeks   PT SHORT TERM GOAL #4   Title The patient will tolerate sit>right sidelying with subjective complaints of head fullness < or equal to 3/10.   Baseline Target date 04/28/2016   Time 4   Period Weeks           PT Long Term Goals - 03/29/16 1338    PT LONG TERM GOAL #1   Title The patient will be indep with progression of HEP for post d/c.   Baseline Target date 05/28/2016   Time 8   Period Weeks   PT LONG TERM GOAL #2   Title The patient will tolerate gaze x 1 viewing x 60 seconds without c/o dizziness.    Baseline Target date 05/28/2016   Time 8   Period Weeks   PT LONG TERM GOAL #3   Title The patient will tolerate bed mobility without dizziness or c/o head fullness.   Baseline Target date 05/28/2016   Time 8   Period Weeks   PT LONG TERM GOAL #4   Title The patient will improve DHI from 66% to < or equal to 50% to demo dec'd  self perception of dizziness.   Baseline Target date 05/28/2016   Time 8   Period Weeks   PT LONG TERM GOAL #5   Title The patient will subjectively report return to driving and work tasks without dizziness limiting function.   Baseline Target date 05/28/2016   Time 8   Period Weeks   Additional Long Term Goals   Additional Long Term Goals Yes   PT LONG TERM GOAL #6   Title Further SOT and DGI goals to follow, as indicated   Baseline Target date 05/28/2016   Time 8   Period Weeks               Plan - 03/29/16 1341    Clinical Impression Statement The patient is a 35 year old female presenting to OP rehab with diminished VOR (per positive bilat head thrust test, and SVA/DVA abnormal), motion sensitivity, and imbalance with high level tasks.  PT to address deficits to promote improved function and return to home and work related tasks.    Rehab Potential Good   PT Frequency 2x / week   PT Duration 8 weeks  plan to reduce to 1x/week after initial home program established   PT Treatment/Interventions ADLs/Self Care Home Management;Balance training;Neuromuscular re-education;Gait training;Stair training;Therapeutic activities;Therapeutic exercise;Canalith Repostioning;Vestibular;Patient/family education   PT Next Visit Plan Check gaze adaptation, add habituation to HEP, SOT/DGI, and progress to STGs/LTGs   Consulted and Agree with Plan of Care Patient      Patient will benefit from skilled therapeutic intervention in order to improve the following deficits and impairments:  Abnormal gait, Decreased balance, Dizziness  Visit Diagnosis: Dizziness and giddiness  Other abnormalities of gait and mobility     Problem  List Patient Active Problem List   Diagnosis Date Noted  . Dizziness   . Syncope   . Multiple sclerosis (HCC) 03/21/2016  . MS (multiple sclerosis) (HCC)   . Vertigo   . White matter abnormality on MRI of brain 03/20/2016  . Faintness     Daryll Spisak,  PT 03/29/2016, 1:44 PM  [facility] [address] [place], [place], [address] Phone: [phone]   Fax:  [phone]  Name: Virginia [name] MRN: [id number] Date of Birth: 02-25-1981

## 2016-03-30 LAB — MISC LABCORP TEST (SEND OUT): LABCORP TEST CODE: 9985

## 2016-03-31 ENCOUNTER — Ambulatory Visit (INDEPENDENT_AMBULATORY_CARE_PROVIDER_SITE_OTHER): Payer: Managed Care, Other (non HMO) | Admitting: Neurology

## 2016-03-31 ENCOUNTER — Encounter: Payer: Self-pay | Admitting: Neurology

## 2016-03-31 VITALS — BP 110/78 | HR 74 | Resp 16 | Ht 64.0 in | Wt 196.0 lb

## 2016-03-31 DIAGNOSIS — R930 Abnormal findings on diagnostic imaging of skull and head, not elsewhere classified: Secondary | ICD-10-CM

## 2016-03-31 DIAGNOSIS — Z79899 Other long term (current) drug therapy: Secondary | ICD-10-CM | POA: Insufficient documentation

## 2016-03-31 DIAGNOSIS — R42 Dizziness and giddiness: Secondary | ICD-10-CM | POA: Diagnosis not present

## 2016-03-31 DIAGNOSIS — G4700 Insomnia, unspecified: Secondary | ICD-10-CM

## 2016-03-31 DIAGNOSIS — G35 Multiple sclerosis: Secondary | ICD-10-CM

## 2016-03-31 DIAGNOSIS — R55 Syncope and collapse: Secondary | ICD-10-CM

## 2016-03-31 DIAGNOSIS — R9082 White matter disease, unspecified: Secondary | ICD-10-CM

## 2016-03-31 MED ORDER — TRAZODONE HCL 100 MG PO TABS: ORAL_TABLET | ORAL | Status: DC

## 2016-03-31 NOTE — Progress Notes (Signed)
[facility]  PATIENT: Virginia [name] DOB: [birth year]  REFERRING DOCTOR OR PCP:    Blair Heys SOURCE: patient, admit/discharge hospital notes, imaging reports, MRI's on PACS  _________________________________   HISTORICAL  CHIEF COMPLAINT:  Chief Complaint  Patient presents with  . Multiple Sclerosis    [name] is here with her mother [name] for eval of MS.  Sts. she was just dx. last week.  Presenting sx. were vertigo, gait disturbance, syncope.  She was seen in the [facility] ER, where she sts. MRI brain showed MS lesions.  She was admitted, given 3 days of IV SoluMedrol, and had an LP.  Has not been given results of the LP.  Today she continues to c/o dizziness, nausea.  She is currently seeing PT for dizziness.  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  Of note, about 2 weeks before her symptoms started she had a tic bite in the left hip region.   She never had a rash. She has not had fevers. She did not have meningeal signs when she presented.  In mid May 2017, she was starting to experience occasional lightheadedness and a spinning vertigo. On 03/19/2016, she had more extreme vertigo and had an episode of syncope. She notices that when she was walking she would veer towards the right. Because of the syncope, she was taken to the emergency room. He had an MRI of the brain that showed several enhancing lesions, potentially worrisome for multiple sclerosis. Additional studies were performed. She received 3 days of IV steroids. The MRI's of the spine did not show any additional plaques. The lumbar puncture showed oligoclonal bands with normal IgG index, consistent with multiple sclerosis.  MRI images from 03/20/2016, 03/22/2016  and CT angiogram images from 03/23/2016 were personally reviewed. The MRI of the brain shows several 2/fair hyperintense foci, some in the periventricular and juxtacortical white matter. Most of the foci enhanced after contrast administration. MRI of the cervical and thoracic spine did not show any spinal cord plaques and there was no significant degenerative change.   CT angiogram was essentially normal showing no significant stenosis.   She underwent a lumbar puncture on 03/21/2016. 4 oligoclonal bands present in the CSF which were not present in the serum, consistent with multiple sclerosis. The IgG index was high normal at 0.6.   There were only 4 white blood cells but 280 red blood cells more consistent with a slightly traumatic tap.    [facility] Spotted Fever CSF IgG was negative but the IgM was positive at 1.41 (less than 0.9 normal).    ANA and ANCA were both negative.  Gait/strength/sensation: She still feels that her gait is off and she is veering towards the right as she walks. Although there is constant weakness or numbness she will get some tingling in her toes  Bladder/bowel: She has noted urinary frequency and urgency but has not having incontinence. There is no constipation.  Vision: She has noted some blurry vision when she looks 4 to the left or 4 to the right. When she looks straight ahead her vision is fine.  Fatigue/sleep: For the past 6 months, she has felt much more fatigue than she used to. She has some insomnia is been a more chronic problem for her.  She has been on clonazepam 0.5 mg at night with only mild benefit.   Mood/cognition: She has had a little bit of anxiety and mild depression this month related to her current illness she was prescribed Lexapro but is not taking it.  She denies any significant change in cognitive function. She is not having significant problems with memory, focus or verbal fluency.    REVIEW OF SYSTEMS: Constitutional: No fevers, chills,  sweats, or change in appetite.   He has fatigue and also reports insomnia Eyes: No visual changes, double vision, eye pain Ear, nose and throat: No hearing loss, ear pain, nasal congestion, sore throat Cardiovascular: No chest pain, palpitations Respiratory: No shortness of breath at rest or with exertion.   No wheezes GastrointestinaI: No nausea, vomiting, diarrhea, abdominal pain, fecal incontinence Genitourinary: No dysuria, urinary retention.   She reports frequency.  No nocturia. Musculoskeletal: No neck pain, back pain Integumentary: No rash, pruritus, skin lesions Neurological: as above Psychiatric: No depression at this time.  No anxiety Endocrine: No palpitations, diaphoresis, change in appetite, change in weigh or increased thirst Hematologic/Lymphatic: No anemia, purpura, petechiae. Allergic/Immunologic: No itchy/runny eyes, nasal congestion, recent allergic reactions, rashes  ALLERGIES: Allergies  Allergen Reactions  . Codeine Other (See Comments)    Hallucinations     HOME MEDICATIONS:  Current outpatient prescriptions:  .  clonazePAM (KLONOPIN) 0.5 MG tablet, Take 0.25 mg by mouth at bedtime., Disp: , Rfl: 0 .  naproxen sodium (ANAPROX) 220 MG tablet, Take 220 mg by mouth 2 (two) times daily as needed (for pain)., Disp: , Rfl:  .  traZODone (DESYREL) 100 MG tablet, Take one or two at bedtime, Disp: 60 tablet, Rfl: 11  PAST MEDICAL HISTORY: Past Medical History  Diagnosis Date  . Anxiety   . Headache   . Multiple sclerosis (HCC)   . Syncope and collapse     PAST SURGICAL HISTORY: No past surgical history on file.  FAMILY HISTORY: Family History  Problem Relation Age of Onset  . Emphysema Mother   . Osteoarthritis Mother   . Other Father   . ALS Maternal Grandmother     SOCIAL HISTORY:  Social History   Social History  . Marital Status: Married    Spouse Name: N/A  . Number of Children: N/A  . Years of Education: N/A   Occupational History    . Not on file.   Social History Main Topics  . Smoking status: Current Every Day Smoker    Types: E-cigarettes  . Smokeless tobacco: Not on file  . Alcohol Use: 0.0 oz/week    0 Standard drinks or equivalent per week     Comment: rarely  . Drug Use: No  . Sexual Activity: Not on file   Other Topics Concern  . Not on file   Social History Narrative     PHYSICAL EXAM  Filed Vitals:   03/31/16 1006  BP: 110/78  Pulse: 74  Resp: 16  Height: 5\' 4"  (1.626 m)  Weight: 196 lb (88.905 kg)    Body mass index is 33.63 kg/(m^2).   General: The patient is well-developed and well-nourished and in no acute distress  Eyes:  Funduscopic exam shows normal optic discs and retinal vessels.  Neck: The neck is supple, no carotid bruits are noted.  The neck is nontender.  Cardiovascular: The heart has a regular rate and rhythm with a normal S1 and S2. There were no murmurs, gallops or rubs. Lungs are clear to auscultation.  Skin: Extremities are without significant edema.  Musculoskeletal:  Back is nontender  Neurologic Exam  Mental status: The patient is alert and oriented x 3 at the time of the examination. The patient has apparent normal recent and remote memory, with an apparently normal attention span and concentration ability.   Speech is normal.  Cranial nerves: Extraocular movements look to be full but she noted mild diplopia on far right gaze. Pupils are equal, round, and reactive to light and accomodation.  Visual fields are full.  Facial symmetry is present. There is good facial sensation to soft touch bilaterally.Facial strength is normal.  Trapezius and sternocleidomastoid strength is normal. No dysarthria is noted.  The tongue is midline, and the patient has symmetric elevation of the soft palate. No obvious hearing deficits are noted.  Motor:  Muscle bulk is normal.   Tone is normal. Strength is  5 / 5 in all 4 extremities.   Sensory: Sensory testing is intact to  pinprick, soft touch and vibration sensation in all 4 extremities.  Coordination: Cerebellar testing reveals good finger-nose-finger and heel-to-shin bilaterally.  Gait and station: Station is normal.   Gait is normal. Tandem gait is wide. Romberg is negative.   Reflexes: Deep tendon reflexes are symmetric and normal bilaterally (brisk at knees but no spread; no clonus).   Plantar responses are flexor.    DIAGNOSTIC DATA (LABS, IMAGING, TESTING) - I reviewed patient records, labs, notes, testing and imaging myself where available.  Lab Results  Component Value Date   WBC 14.2* 03/19/2016   HGB 14.5 03/19/2016   HCT 42.6 03/19/2016   MCV 90.8 03/19/2016   PLT 335 03/19/2016      Component Value Date/Time   NA 137 03/19/2016 1929   K 3.7 03/19/2016 1929   CL 110 03/19/2016 1929   CO2 18* 03/19/2016 1929   GLUCOSE 100* 03/19/2016 1929   BUN 16 03/19/2016 1929   CREATININE 0.74 03/19/2016 1929   CALCIUM 8.9 03/19/2016 1929   PROT 6.7 02/24/2016 1141   ALBUMIN 4.1 03/21/2016 1530   ALBUMIN 3.8 02/24/2016 1141   AST 12* 02/24/2016 1141   ALT 13* 02/24/2016 1141   ALKPHOS 61 02/24/2016 1141   BILITOT 0.4 02/24/2016 1141   GFRNONAA >60 03/19/2016 1929   GFRAA >60 03/19/2016 1929    Lab Results  Component Value Date   TSH 0.635 02/24/2016       ASSESSMENT AND PLAN  MS (multiple sclerosis) (HCC) - Plan: Quantiferon tb gold assay (blood)  Dizziness - Plan: [place] mtn spotted fvr abs pnl(IgG+IgM), B. burgdorfi antibodies  Vertigo  White matter abnormality on MRI of brain - Plan: [place] mtn spotted fvr abs pnl(IgG+IgM), B. burgdorfi antibodies  Syncope, unspecified syncope type  High risk medication use - Plan: Quantiferon tb gold assay (blood)  Insomnia    In summary, Estephani Marlar is a 35 year old woman with vertigo and gait disturbance who has MRI and CSF changes consistent with multiple sclerosis.     In relation her tandem gait is wide but she otherwise  performs well. We went over the benefits and risks possible tolerability issues of several different multiple sclerosis disease modifying therapies.    As her MS does not appear to be especially aggressive, it would be reasonable to begin with one of the oral agents and we spent the most time discussing and differentiating Aubagio and Tecfidera. She is most interested in starting Aubagio I will check TB serology. She did have a recent bite and  had a mild positive [facility] spotted fever IgM. I will recheck these antibodies as well as a Lyme titer and Western blot that there is not a confounding factor. Although [facility] spotted fever can cause CNS issues, the more likely changes on MRI are ischemic or hemorrhagic and that would not be consistent with her current MRI.  She also reports insomnia which present many years but worse the last couple months. I will prescribe trazodone for this.  She will return to see me in 2 months or sooner if she has new or worsening neurologic symptoms.  Thank you for asking me to see Ms. Aguallo for a neurologic consultation. Please let me know upon to be of further assistance with her or other patients in the future.   Lisaanne Lawrie A. Epimenio Foot, MD, PhD 03/31/2016, 11:09 AM Certified in Neurology, Clinical Neurophysiology, Sleep Medicine, Pain Medicine and Neuroimaging  [facility] [address], [address] [place], [place] [address] [phone]

## 2016-04-04 ENCOUNTER — Ambulatory Visit: Payer: Managed Care, Other (non HMO) | Admitting: Rehabilitative and Restorative Service Providers"

## 2016-04-04 LAB — ROCKY MTN SPOTTED FVR ABS PNL(IGG+IGM)
RMSF IGG: NEGATIVE
RMSF IGM: 1.38 {index} — AB (ref 0.00–0.89)

## 2016-04-04 LAB — B. BURGDORFI ANTIBODIES: Lyme IgG/IgM Ab: <0.91 {ISR} (ref 0.00–0.90)

## 2016-04-06 ENCOUNTER — Ambulatory Visit
Payer: Managed Care, Other (non HMO) | Attending: Internal Medicine | Admitting: Rehabilitative and Restorative Service Providers"

## 2016-04-06 ENCOUNTER — Telehealth: Payer: Self-pay | Admitting: Neurology

## 2016-04-06 DIAGNOSIS — R42 Dizziness and giddiness: Secondary | ICD-10-CM

## 2016-04-06 DIAGNOSIS — R2689 Other abnormalities of gait and mobility: Secondary | ICD-10-CM

## 2016-04-06 DIAGNOSIS — G35 Multiple sclerosis: Secondary | ICD-10-CM

## 2016-04-06 DIAGNOSIS — Z79899 Other long term (current) drug therapy: Secondary | ICD-10-CM

## 2016-04-06 LAB — QUANTIFERON IN TUBE
QFT TB AG MINUS NIL VALUE: 0.14 IU/mL
QUANTIFERON MITOGEN VALUE: 7.02 IU/mL
QUANTIFERON NIL VALUE: 0.07 [IU]/mL
QUANTIFERON TB AG VALUE: 0.21 [IU]/mL
QUANTIFERON TB GOLD: NEGATIVE

## 2016-04-06 LAB — QUANTIFERON TB GOLD ASSAY (BLOOD)

## 2016-04-06 NOTE — Telephone Encounter (Signed)
I left a message on both her home and cell phone but was unable to reach [place].  The [facility] spotted fever test was still abnormal. Although that she RMSF, I would like her to see infectious disease specialist to make sure that she is fine before I start a medication such as Aubagio that can push the immune system down.

## 2016-04-06 NOTE — Patient Instructions (Signed)
Feet Apart (Compliant Surface) Varied Arm Positions - Eyes Closed    Stand on compliant surface: __pillow______ with feet shoulder width apart and arms out. Close eyes and visualize upright position. Hold__30__ seconds. Repeat _3___ times per session. Do ___2_ sessions per day.  Copyright  VHI. All rights reserved.   Feet Apart (Compliant Surface) Head Motion - Eyes Open    With eyes open, standing on compliant surface: __pillow______, feet shoulder width apart, move head slowly: up and down x 5 reps.  Side to side x 5 reps.  Do __2__ sessions per day.  Copyright  VHI. All rights reserved.   Feet Heel-Toe "Tandem", Varied Arm Positions - Eyes Open    With eyes open, right foot directly in front of the other, arms out, look straight ahead at a stationary object. Hold _30__ seconds. Repeat _3___ times per session. Do _2_ sessions per day.  Copyright  VHI. All rights reserved.   Special Instructions: Avoid allowing symptoms to go above 5/10.  Exercises may bring on mild to moderate symptoms of dizziness, head fullness that resolve within 30 minutes of completing exercises. If symptoms are lasting longer than 30 minutes, modify your exercises by:  >decreasing the # of times you complete each activity >ensuring your symptoms return to baseline before moving onto the next exercise >dividing up exercises so you do not do them all in one session, but multiple short sessions throughout the day >doing them once a day until symptoms improve

## 2016-04-06 NOTE — Therapy (Signed)
[facility] [address] [place], [place], [address] Phone: [phone]   Fax:  [phone]  Physical Therapy Treatment  Patient Details  Name: Virginia [name] MRN: [id number] Date of Birth: October 30, 1981 Referring Provider: Despina Arias, MD  Encounter Date: 04/06/2016      PT End of Session - 04/06/16 1441    Visit Number 2   Number of Visits 12   Date for PT Re-Evaluation 05/28/16   Authorization Type [employer]   PT Start Time [phone]   PT Stop Time 1020   PT Time Calculation (min) 42 min   Activity Tolerance Patient tolerated treatment well   Behavior During Therapy [facility] for tasks assessed/performed      Past Medical History  Diagnosis Date  . Anxiety   . Headache   . Multiple sclerosis (HCC)   . Syncope and collapse     No past surgical history on file.  There were no vitals filed for this visit.      Subjective Assessment - 04/06/16 0941    Subjective The patient reports having bad days and okay days.  She is noting that symtpoms are worse when turning quickly in the car, returning to stand when bending to dry hair and even squatting to get back up. She describes it as spinning sensation and "my balance goes."   Patient Stated Goals "Everyday things".  I'm scared to drive, go out of the house for long periods, or to work (doesn't want vertigo spell to happen at work).    Currently in Pain? Yes   Pain Score --  mild pain L lateral ribs/back ? patient reports may have run into a wall   Effect of Pain on Daily Activities PT to monitor, but no goal to follow.            [facility] PT Assessment - 04/06/16 0956    Standardized Balance Assessment   Standardized Balance Assessment Balance Master Testing   Balance Master Testing Sensory Organization Test   Balance Master Testing    Results The patient scored 36% compared to age/height norms of 70%.  She has mildly dec'd somatosensory use, moderately dec'd visual and  vestibular use for balance.           [facility] Adult PT Treatment/Exercise - 04/06/16 1443    Neuro Re-ed    Neuro Re-ed Details  Corner balance exercises on pillow with narrowing base of support with eyes open + head motion, eyes closed.  Performed tandem stance activities working on visual cues for improved balance.  Education re: HEP focus and emphasis.          Vestibular Treatment/Exercise - 04/06/16 0952    Vestibular Treatment/Exercise   Vestibular Treatment Provided Habituation;Gaze   Habituation Exercises Standing Horizontal Head Turns;Standing Vertical Head Turns   Gaze Exercises X1 Viewing Horizontal;X1 Viewing Vertical   X1 Viewing Horizontal   Foot Position standing feet apart   Comments 10 reps x 2; attempted to increase to 20 reps, but patient symptoms exceed 5/10 with "dizziness" noted.               PT Education - 04/06/16 1016    Education provided Yes   Education Details HEP: balance on compliant surfaces with head motion and eyes closed, tandem stance   Person(s) Educated Patient   Methods Explanation;Demonstration;Handout   Comprehension Verbalized understanding;Returned demonstration          PT Short Term Goals - 03/29/16 1335    PT  SHORT TERM GOAL #1   Title The patient will be indep with HEP for gaze adaptation and habituation.    Baseline Target date 04/28/2016   Time 4   Period Weeks   PT SHORT TERM GOAL #2   Title The patient will tolerate gaze x 1 viewing x 30 seconds with no increase in dizziness from baseline.   Baseline Target date 04/28/2016   Time 4   Period Weeks   PT SHORT TERM GOAL #3   Title The patient will be further assessed on SOT and DGI and goals to follow, if indicated.   Baseline Target date 04/28/2016   Time 4   Period Weeks   PT SHORT TERM GOAL #4   Title The patient will tolerate sit>right sidelying with subjective complaints of head fullness < or equal to 3/10.   Baseline Target date 04/28/2016   Time 4   Period  Weeks           PT Long Term Goals - 03/29/16 1338    PT LONG TERM GOAL #1   Title The patient will be indep with progression of HEP for post d/c.   Baseline Target date 05/28/2016   Time 8   Period Weeks   PT LONG TERM GOAL #2   Title The patient will tolerate gaze x 1 viewing x 60 seconds without c/o dizziness.    Baseline Target date 05/28/2016   Time 8   Period Weeks   PT LONG TERM GOAL #3   Title The patient will tolerate bed mobility without dizziness or c/o head fullness.   Baseline Target date 05/28/2016   Time 8   Period Weeks   PT LONG TERM GOAL #4   Title The patient will improve DHI from 66% to < or equal to 50% to demo dec'd self perception of dizziness.   Baseline Target date 05/28/2016   Time 8   Period Weeks   PT LONG TERM GOAL #5   Title The patient will subjectively report return to driving and work tasks without dizziness limiting function.   Baseline Target date 05/28/2016   Time 8   Period Weeks   Additional Long Term Goals   Additional Long Term Goals Yes   PT LONG TERM GOAL #6   Title Further SOT and DGI goals to follow, as indicated   Baseline Target date 05/28/2016   Time 8   Period Weeks               Plan - 04/06/16 1441    Clinical Impression Statement PT emphasized balance and habituation activities today to further develop HEP.  Continue working to [employer].    PT Treatment/Interventions ADLs/Self Care Home Management;Balance training;Neuromuscular re-education;Gait training;Stair training;Therapeutic activities;Therapeutic exercise;Canalith Repostioning;Vestibular;Patient/family education   PT Next Visit Plan Check gaze adaptation, add habituation to HEP, SOT/DGI, and progress to STGs/LTGs   Consulted and Agree with Plan of Care Patient      Patient will benefit from skilled therapeutic intervention in order to improve the following deficits and impairments:  Abnormal gait, Decreased balance, Dizziness  Visit Diagnosis: Dizziness  and giddiness  Other abnormalities of gait and mobility     Problem List Patient Active Problem List   Diagnosis Date Noted  . High risk medication use 03/31/2016  . Insomnia 03/31/2016  . Dizziness   . Syncope   . Multiple sclerosis (HCC) 03/21/2016  . MS (multiple sclerosis) (HCC)   . Vertigo   . White matter abnormality on MRI  of brain 03/20/2016  . Faintness     Markevion Lattin, PT 04/06/2016, 2:44 PM  [facility] [address] [place], [place], [address] Phone: [phone]   Fax:  [phone]  Name: Virginia [name] MRN: [id number] Date of Birth: 1981/03/22

## 2016-04-07 MED ORDER — TEMAZEPAM 30 MG PO CAPS: 30.0000 mg | ORAL_CAPSULE | Freq: Every evening | ORAL | Status: DC | PRN

## 2016-04-07 NOTE — Telephone Encounter (Signed)
I spoke to Virginia [name] about the [name] spotted fever IgM. Like her to see infectious disease I want to make sure that she does not have an active infection before starting medication for her MS  Additionally she reported that she is having a lot of insomnia. Trazodone did not help. I will call in temazepam.

## 2016-04-12 ENCOUNTER — Ambulatory Visit: Payer: Managed Care, Other (non HMO) | Admitting: Rehabilitative and Restorative Service Providers"

## 2016-04-12 NOTE — Telephone Encounter (Signed)
Margaret/Infectious Disease [phone] called said Dr Ninetta Lights said pt does not need an appt. Provider can call the Dr on call and can consult over the phone.

## 2016-04-14 ENCOUNTER — Encounter: Payer: Self-pay | Admitting: Neurology

## 2016-04-17 NOTE — Telephone Encounter (Signed)
Please let her know that I spoke with infectious disease since the [facility] spotted fever blood test was slightly elevated.. Likelihood of [facility] spotted fever is extremely low despite the blood tests therefore, we can go ahead and start Aubagio.

## 2016-04-17 NOTE — Telephone Encounter (Signed)
Pt called wanting to know status of her referral . Please call and advise

## 2016-04-17 NOTE — Telephone Encounter (Signed)
I have spoken with Noah this afternoon, and per RAS, advised that RAS has spoken with an ID doctor this afternoon, and the likelihood of her having RMSF is extremely low, therefore she can start Aubagio. She verbalized understanding of same.  Aubagio srf completed and faxed to [facility] fax  #[phone].  [place]usta also has c/o edema, pain right eye today.  Denes visual disturbance.  Per RAS, I have referred her to her opthalmologist or pcp to r/o orbital cellulitis or other infection/fim

## 2016-04-17 NOTE — Addendum Note (Signed)
Addended by: Candis Schatz I on: 04/17/2016 04:44 PM   Modules accepted: Medications

## 2016-04-17 NOTE — Telephone Encounter (Signed)
LMTC./fim 

## 2016-04-19 ENCOUNTER — Ambulatory Visit: Payer: Managed Care, Other (non HMO) | Admitting: Rehabilitative and Restorative Service Providers"

## 2016-04-19 ENCOUNTER — Encounter: Payer: Self-pay | Admitting: *Deleted

## 2016-04-19 DIAGNOSIS — R42 Dizziness and giddiness: Secondary | ICD-10-CM | POA: Diagnosis not present

## 2016-04-19 DIAGNOSIS — R2689 Other abnormalities of gait and mobility: Secondary | ICD-10-CM

## 2016-04-19 NOTE — Therapy (Signed)
[facility] [address] [place], [place], [address] Phone: [phone]   Fax:  [phone]  Physical Therapy Treatment  Patient Details  Name: Virginia [name] MRN: [id number] Date of Birth: September 10, 1981 Referring Provider: Arlice Colt, MD  Encounter Date: 04/19/2016      PT End of Session - 04/19/16 1147    Visit Number 3   Number of Visits 12   Date for PT Re-Evaluation 05/28/16   Authorization Type [employer]   PT Start Time 1105   PT Stop Time 1145   PT Time Calculation (min) 40 min   Activity Tolerance Patient tolerated treatment well   Behavior During Therapy [facility] for tasks assessed/performed      Past Medical History  Diagnosis Date  . Anxiety   . Headache   . Multiple sclerosis ([place])   . Syncope and collapse     No past surgical history on file.  There were no vitals filed for this visit.      Subjective Assessment - 04/19/16 1109    Subjective The patient reports that she is laying around a lot right now due to nausea, and fatigue.  She feels that symptoms are getting worse and also notes facial burning.  She reports she does not feel "dizzy", just "tingly".  She reports one dizzy spell yesterday, "other than that it has been easing off."   She reports change in sleeping meds initially helped, now she is having difficulty sleeping.  The patient was doing really well last week with exercises, but not this week due to having a bad week.  No longer gets dizzy getting into/out of bed.  She reports the episode yesterday occurred when turning her head quickly and getting off balance.    Patient Stated Goals "Everyday things".  I'm scared to drive, go out of the house for long periods, or to work (doesn't want vertigo spell to happen at work).    Currently in Pain? Yes  slight headache thinks it is from not sleeping                Vestibular Assessment - 04/19/16 1121    Positional Testing   Sidelying  Test Sidelying Right;Sidelying Left   Sidelying Right   Sidelying Right Duration 2/10 "cotton head sensation"   Sidelying Right Symptoms No nystagmus   Sidelying Left   Sidelying Left Duration 1/10 sensation   Sidelying Left Symptoms No nystagmus                 [facility] Adult PT Treatment/Exercise - 04/19/16 1136    Exercises   Exercises Other Exercises   Other Exercises  General exercises for strength/conditioning including scapular retraction and arm diagonal flexion/abduction with red band.          Vestibular Treatment/Exercise - 04/19/16 1133    Vestibular Treatment/Exercise   Vestibular Treatment Provided Habituation;Gaze   Habituation Exercises Standing Horizontal Head Turns;Standing Vertical Head Turns;360 degree Turns;180 degree Turns   Gaze Exercises X1 Viewing Horizontal   Standing Horizontal Head Turns   Number of Reps  10   Symptom Description  standing on foam with turns    360 degree Turns   Number of Reps  4   Symptom Description  to the right, symtpoms increase to 8/10   COMMENT Modified to quarter turns at home.                PT Education - 04/19/16 1146    Education provided Yes  Education Details theraband exercises for UEs (scapular retraction and shoulder flexion); quarter turns.    Person(s) Educated Patient   Methods Explanation;Demonstration;Handout   Comprehension Verbalized understanding;Returned demonstration          PT Short Term Goals - 04/19/16 1119    PT SHORT TERM GOAL #1   Title The patient will be indep with HEP for gaze adaptation and habituation.    Baseline Target date 04/28/2016   Time 4   Period Weeks   Status Achieved   PT SHORT TERM GOAL #2   Title The patient will tolerate gaze x 1 viewing x 30 seconds with no increase in dizziness from baseline.   Baseline Target date 04/28/2016   Time 4   Period Weeks   PT SHORT TERM GOAL #3   Title The patient will be further assessed on SOT and DGI and goals to  follow, if indicated.   Baseline Target date 04/28/2016   Time 4   Period Weeks   PT SHORT TERM GOAL #4   Title The patient will tolerate sit>right sidelying with subjective complaints of head fullness < or equal to 3/10.   Baseline Met on 04/19/2016- with c/o "cotton head" sensation x 2/10.   Time 4   Period Weeks   Status Achieved           PT Long Term Goals - 03/29/16 1338    PT LONG TERM GOAL #1   Title The patient will be indep with progression of HEP for post d/c.   Baseline Target date 05/28/2016   Time 8   Period Weeks   PT LONG TERM GOAL #2   Title The patient will tolerate gaze x 1 viewing x 60 seconds without c/o dizziness.    Baseline Target date 05/28/2016   Time 8   Period Weeks   PT LONG TERM GOAL #3   Title The patient will tolerate bed mobility without dizziness or c/o head fullness.   Baseline Target date 05/28/2016   Time 8   Period Weeks   PT LONG TERM GOAL #4   Title The patient will improve DHI from 66% to < or equal to 50% to demo dec'd self perception of dizziness.   Baseline Target date 05/28/2016   Time 8   Period Weeks   PT LONG TERM GOAL #5   Title The patient will subjectively report return to driving and work tasks without dizziness limiting function.   Baseline Target date 05/28/2016   Time 8   Period Weeks   Additional Long Term Goals   Additional Long Term Goals Yes   PT LONG TERM GOAL #6   Title Further SOT and DGI goals to follow, as indicated   Baseline Target date 05/28/2016   Time 8   Period Weeks               Plan - 04/19/16 1148    Clinical Impression Statement The patient reports dizziness is improving overall, however fatigue and nausea worsening.  Dizziness is provoked in the clinic with quick turns and PT provided component of movement for home program to tolerance.  PT to continue developing home program to improve overall mobility and reduce dizziness.    PT Treatment/Interventions ADLs/Self Care Home  Management;Balance training;Neuromuscular re-education;Gait training;Stair training;Therapeutic activities;Therapeutic exercise;Canalith Repostioning;Vestibular;Patient/family education   PT Next Visit Plan work on bending and return to stand for balance (not dizzy), unsure if lightheaded or dizzy*, cautious b/c has passed out in the past.  Consulted and Agree with Plan of Care Patient      Patient will benefit from skilled therapeutic intervention in order to improve the following deficits and impairments:  Abnormal gait, Decreased balance, Dizziness  Visit Diagnosis: Dizziness and giddiness  Other abnormalities of gait and mobility     Problem List Patient Active Problem List   Diagnosis Date Noted  . High risk medication use 03/31/2016  . Insomnia 03/31/2016  . Dizziness   . Syncope   . Multiple sclerosis ([place]) 03/21/2016  . MS (multiple sclerosis) ([place])   . Vertigo   . White matter abnormality on MRI of brain 03/20/2016  . Faintness     Kadeja Granada, PT 04/19/2016, 10:13 PM  [facility] [address] [place], [place], [address] Phone: [phone]   Fax:  [phone]  Name: Eknoor Novack MRN: [id number] Date of Birth: 22-Mar-1981

## 2016-04-19 NOTE — Patient Instructions (Signed)
Turning in Place: Solid Surface    Standing in place, lead with head and turn slowly making quarter turn toward left. Focus on a target.  Then turn a quarter turn back to the right.  Focus on a target. Repeat __5__ times per session. Do _1-2___ sessions per day.  Copyright  VHI. All rights reserved.    General Conditioning: Resisted Horizontal Abduction: Bilateral    Sit or stand, tubing in both hands, arms out in front. Keeping arms straight, pinch shoulder blades together and stretch arms out. Repeat __10__ times per set. Do _1___ sets per session. Do _1-2___ sessions per day.  http://orth.exer.us/969   Copyright  VHI. All rights reserved.   Shoulder Flexors    Sit on ball or firm surface, or stand with red band (can anchor under foot). Lift arm forward up to reach overhead level, elbow straight. Keep head and back straight. Hold __3__ seconds. Repeat _10___ times. Do _1-2___ sessions per day.  Copyright  VHI. All rights reserved.

## 2016-04-21 ENCOUNTER — Encounter: Payer: Self-pay | Admitting: *Deleted

## 2016-04-25 ENCOUNTER — Telehealth: Payer: Self-pay | Admitting: Neurology

## 2016-04-25 ENCOUNTER — Encounter: Payer: Self-pay | Admitting: *Deleted

## 2016-04-25 NOTE — Telephone Encounter (Signed)
LMTC./fim 

## 2016-04-25 NOTE — Telephone Encounter (Signed)
Patient called, was advised by Dr. Epimenio Foot to call if change in symptoms. Patient states, she's not sleeping, has maybe had 8 hours of sleep in the past week. "real weird hot flashes in face, hot to touch".

## 2016-04-26 ENCOUNTER — Ambulatory Visit: Payer: Managed Care, Other (non HMO) | Admitting: Rehabilitative and Restorative Service Providers"

## 2016-04-26 DIAGNOSIS — R2689 Other abnormalities of gait and mobility: Secondary | ICD-10-CM

## 2016-04-26 DIAGNOSIS — R42 Dizziness and giddiness: Secondary | ICD-10-CM

## 2016-04-26 NOTE — Therapy (Signed)
[facility] [address] [place], [place], [address] Phone: [phone]   Fax:  [phone]  Physical Therapy Treatment  Patient Details  Name: Virginia [name] MRN: [id number] Date of Birth: 15-Aug-1981 Referring Provider: Arlice Colt, MD  Encounter Date: 04/26/2016      PT End of Session - 04/26/16 1132    Visit Number 4   Number of Visits 12   Date for PT Re-Evaluation 05/28/16   Authorization Type [employer]   PT Start Time 1105   PT Stop Time 1145   PT Time Calculation (min) 40 min   Activity Tolerance Patient tolerated treatment well   Behavior During Therapy [facility] for tasks assessed/performed      Past Medical History  Diagnosis Date  . Anxiety   . Headache   . Multiple sclerosis ([place])   . Syncope and collapse     No past surgical history on file.  There were no vitals filed for this visit.      Subjective Assessment - 04/26/16 1104    Subjective The patient reports that fatigue is the main complaint.  "I'm sore everywhere" describing legs, arm soreness stating "it's been a rough week."  She notices dizziness with turning head quickly or getting up and down.  She is performing gaze exercises and reports "I can't go too fast."   Currently in Pain? Yes   Pain Score 7    Pain Location --  legs and arms   Pain Orientation Right;Left   Pain Descriptors / Indicators Aching;Sore   Pain Type Acute pain   Pain Onset More than a month ago   Pain Frequency Intermittent   Aggravating Factors  comes and goes   Pain Relieving Factors unsure      NEUROMUSCULAR RE-EDUCATION: Reviewed corner exercises including foam with eyes closed (no longer challenging/ d/c exercise), foam with head motion, tandem stance, quarter turns (does not provoke symptoms), gaze stabilization with some correction for head positioning. Also tried 360 degree turns with dizziness 8/10.  Provided portion of movement for HEP.  SELF CARE/HOME  MANAGEMENT: Discussed MS and fatigue.  Printed and reviewed MS society recommendations on fatigue for patient and gave folder to add further MS resources to in the future. Also discussed HEP progression and waiting 2 more weeks until next visit to see if she makes progress once starting MS medication (came in mail yesterday).       Vestibular Assessment - 04/26/16 1140    Orthostatics   BP supine (x 5 minutes) 109/75 mmHg   HR supine (x 5 minutes) 68   BP standing (after 1 minute) 110/82 mmHg   HR standing (after 1 minute) 79   BP standing (after 3 minutes) 107/86 mmHg   HR standing (after 3 minutes) 58           PT Education - 04/26/16 1404    Education provided Yes   Education Details reviewed HEP and modified due to patient needs (quarter turns do not provoke dizziness/ switched out for reaching across midline activities).  MS and fatigue handout from national MS society.   Person(s) Educated Patient   Methods Explanation;Demonstration;Handout   Comprehension Verbalized understanding;Returned demonstration          PT Short Term Goals - 04/19/16 1119    PT SHORT TERM GOAL #1   Title The patient will be indep with HEP for gaze adaptation and habituation.    Baseline Target date 04/28/2016   Time 4  Period Weeks   Status Achieved   PT SHORT TERM GOAL #2   Title The patient will tolerate gaze x 1 viewing x 30 seconds with no increase in dizziness from baseline.   Baseline Target date 04/28/2016   Time 4   Period Weeks   PT SHORT TERM GOAL #3   Title The patient will be further assessed on SOT and DGI and goals to follow, if indicated.   Baseline Target date 04/28/2016   Time 4   Period Weeks   PT SHORT TERM GOAL #4   Title The patient will tolerate sit>right sidelying with subjective complaints of head fullness < or equal to 3/10.   Baseline Met on 04/19/2016- with c/o "cotton head" sensation x 2/10.   Time 4   Period Weeks   Status Achieved           PT Long  Term Goals - 03/29/16 1338    PT LONG TERM GOAL #1   Title The patient will be indep with progression of HEP for post d/c.   Baseline Target date 05/28/2016   Time 8   Period Weeks   PT LONG TERM GOAL #2   Title The patient will tolerate gaze x 1 viewing x 60 seconds without c/o dizziness.    Baseline Target date 05/28/2016   Time 8   Period Weeks   PT LONG TERM GOAL #3   Title The patient will tolerate bed mobility without dizziness or c/o head fullness.   Baseline Target date 05/28/2016   Time 8   Period Weeks   PT LONG TERM GOAL #4   Title The patient will improve DHI from 66% to < or equal to 50% to [name] dec'd self perception of dizziness.   Baseline Target date 05/28/2016   Time 8   Period Weeks   PT LONG TERM GOAL #5   Title The patient will subjectively report return to driving and work tasks without dizziness limiting function.   Baseline Target date 05/28/2016   Time 8   Period Weeks   Additional Long Term Goals   Additional Long Term Goals Yes   PT LONG TERM GOAL #6   Title Further SOT and DGI goals to follow, as indicated   Baseline Target date 05/28/2016   Time 8   Period Weeks               Plan - 04/26/16 1405    Clinical Impression Statement The patient is most limited by fatigue.  In therapy, 360 degree turns still provoke 8/10 symtpoms, so provided partial turns for HEP.  PT also spent time providing education on MS and fatigue as this is most limiting symptom at this time.   PT Treatment/Interventions ADLs/Self Care Home Management;Balance training;Neuromuscular re-education;Gait training;Stair training;Therapeutic activities;Therapeutic exercise;Canalith Repostioning;Vestibular;Patient/family education   PT Next Visit Plan Check STGs, VOC rehab referral, MS resources as needed   Consulted and Agree with Plan of Care Patient      Patient will benefit from skilled therapeutic intervention in order to improve the following deficits and impairments:   Abnormal gait, Decreased balance, Dizziness  Visit Diagnosis: Dizziness and giddiness  Other abnormalities of gait and mobility     Problem List Patient Active Problem List   Diagnosis Date Noted  . High risk medication use 03/31/2016  . Insomnia 03/31/2016  . Dizziness   . Syncope   . Multiple sclerosis ([place]) 03/21/2016  . MS (multiple sclerosis) ([place])   . Vertigo   .  White matter abnormality on MRI of brain 03/20/2016  . Faintness     Cadience Bradfield, PT 04/26/2016, 2:07 PM  [facility] [address] [place], [place], [address] Phone: [phone]   Fax:  [phone]  Name: Virginia [name] MRN: [id number] Date of Birth: 12/21/1980

## 2016-04-26 NOTE — Patient Instructions (Signed)
Gaze Stabilization: Tip Card 1.Target must remain in focus, not blurry, and appear stationary while head is in motion. 2.Perform exercises with small head movements (45 to either side of midline). 3.Increase speed of head motion so long as target is in focus. 4.If you wear eyeglasses, be sure you can see target through lens (therapist will give specific instructions for bifocal / progressive lenses). 5.These exercises may provoke dizziness or nausea. Work through these symptoms. If too dizzy, slow head movement slightly. Rest between each exercise. 6.Exercises demand concentration; avoid distractions. 7.For safety, perform standing exercises close to a counter, wall, corner, or next to someone.  Copyright  VHI. All rights reserved.  Gaze Stabilization: Standing Feet Apart   Feet shoulder width apart, keeping eyes on target on wall 3 feet away, tilt head down slightly and move head side to side for 10 repetitions. Rest. Repeat while moving head up and down for 10 repetitions. Do 3 sessions per day.   Copyright  VHI. All rights reserved.   Special Instructions: Exercises may bring on mild to moderate symptoms of dizziness, nausea, headache that resolve within 30 minutes of completing exercises. If symptoms are lasting longer than 30 minutes, modify your exercises by: >decreasing the # of times you complete each activity >ensuring your symptoms return to baseline before moving onto the next exercise >dividing up exercises so you do not do them all in one session, but multiple short sessions throughout the day >doing them once a day until symptoms improve    Feet Apart (Compliant Surface) Head Motion - Eyes Open    With eyes open, standing on compliant surface: __pillow______, feet shoulder width apart, move head slowly: up and down x 5 reps. Side to side x 5 reps.  Do __2__ sessions per day.  Copyright  VHI. All rights reserved.   Feet Apart, Arm Motion - Eyes  Open    With eyes open, feet apart, MOVE ARMS TO REACH OPPOSITE WALL (RIGHT ARM REACHES TO WALL ON LEFT/OPPOSITE).  Have your head follow your hand to increase movement.   Repeat _5-10___ times per session. Do __2__ sessions per day.  Copyright  VHI. All rights reserved.    Special Instructions: Avoid allowing symptoms to go above 5/10.  Exercises may bring on mild to moderate symptoms of dizziness, head fullness that resolve within 30 minutes of completing exercises. If symptoms are lasting longer than 30 minutes, modify your exercises by: >decreasing the # of times you complete each activity >ensuring your symptoms return to baseline before moving onto the next exercise >dividing up exercises so you do not do them all in one session, but multiple short sessions throughout the day >doing them once a day until symptoms improve     General Conditioning: Resisted Horizontal Abduction: Bilateral    Sit or stand, tubing in both hands, arms out in front. Keeping arms straight, pinch shoulder blades together and stretch arms out. Repeat __10__ times per set. Do _1___ sets per session. Do _1-2___ sessions per day.  http://orth.exer.us/969   Copyright  VHI. All rights reserved.   Shoulder Flexors    Sit on ball or firm surface, or stand with red band (can anchor under foot). Lift arm forward up to reach overhead level, elbow straight. Keep head and back straight. Hold __3__ seconds. Repeat _10___ times. Do _1-2___ sessions per day.  Copyright  VHI. All rights reserved.    Virginia [name], PT6/21/2017 10:14 PM Signed [facility] [address]  [place], [place], [address] Phone: [phone] Fax: [phone]

## 2016-05-08 ENCOUNTER — Telehealth: Payer: Self-pay | Admitting: Neurology

## 2016-05-08 ENCOUNTER — Encounter: Payer: Self-pay | Admitting: Neurology

## 2016-05-08 MED ORDER — METHYLPREDNISOLONE 4 MG PO TBPK: ORAL_TABLET | ORAL | Status: DC

## 2016-05-08 NOTE — Telephone Encounter (Signed)
I have spoken with Virginia [name] this afternoon.  She sts. she stood from a sitting position yesterday, had sudden severe lbp radiating down bilat lets. Per RAS, ok for Medrol dsoe pk, and pt. to call Thursday afternoon, for a w/i appt. on Friday if she is no better.  [facility] per pt's reques/fim

## 2016-05-08 NOTE — Telephone Encounter (Signed)
Patient called to advise, "got off couch yesterday, felt something, excruciating pain when she stood up, back throbbing, shooting pains down both legs, can barely walk".

## 2016-05-09 ENCOUNTER — Telehealth: Payer: Self-pay | Admitting: *Deleted

## 2016-05-09 NOTE — Telephone Encounter (Signed)
Pt reed group form on Faith desk. 

## 2016-05-15 ENCOUNTER — Telehealth: Payer: Self-pay | Admitting: *Deleted

## 2016-05-15 ENCOUNTER — Ambulatory Visit
Payer: Managed Care, Other (non HMO) | Attending: Internal Medicine | Admitting: Rehabilitative and Restorative Service Providers"

## 2016-05-15 DIAGNOSIS — R42 Dizziness and giddiness: Secondary | ICD-10-CM | POA: Diagnosis not present

## 2016-05-15 DIAGNOSIS — R2689 Other abnormalities of gait and mobility: Secondary | ICD-10-CM | POA: Diagnosis present

## 2016-05-15 MED ORDER — TEMAZEPAM 30 MG PO CAPS: 30.0000 mg | ORAL_CAPSULE | Freq: Every evening | ORAL | Status: DC | PRN

## 2016-05-15 NOTE — Therapy (Signed)
[facility] [address] [place], [place], [address] Phone: [phone]   Fax:  [phone]  Physical Therapy Treatment  Patient Details  Name: Virginia [name] MRN: [id number] Date of Birth: 1981/09/17 Referring Provider: Arlice Colt, MD  Encounter Date: 05/15/2016      PT End of Session - 05/15/16 1213    Visit Number 5   Number of Visits 12   Date for PT Re-Evaluation 05/28/16   Authorization Type [employer]   PT Start Time 1145   PT Stop Time 1230   PT Time Calculation (min) 45 min   Activity Tolerance Patient tolerated treatment well   Behavior During Therapy [facility] for tasks assessed/performed      Past Medical History  Diagnosis Date  . Anxiety   . Headache   . Multiple sclerosis ([place])   . Syncope and collapse     No past surgical history on file.  There were no vitals filed for this visit.      Subjective Assessment - 05/15/16 1147    Subjective The patient reports her legs and back have been worse.  Dizziness is "not bad", having minimal episodes when she first gets up.  She reports that her legs are tight and feel sore like she has pulled a muscle.  The patient has not started her MS medications (due to going on vacation this week and knowing that it could make her sick).  Once she gets moving, it eases off.  However, if she is up walking around for a long time, she gets core pain in lower abdomen.       THERAPEUTIC EXERCISE: Bridges x 10 reps with cues on technique Bridges with marching Plank on knees x 2 reps Heel cord stretch standing Piriformis stretch seated Hamstring stretch seated  SELF CARE/HOME MANAGEMENT: Patient inquired about MS and a hot tub.  PT educated patient on maintaining low core temperature and discussed weakness and fatigue that can occur with heat.   Discussed MS and fatigue and some strategies to stay cool while traveling at the beach (including cooling towel, ice water,  indoors in a/c every couple of hours).  NEUROMUSCULAR RE-EDUCATION: Reviewed prior balance HEP in corner and patient is able to do without difficulty.  Recommended progression to today's HEP + gaze, + quarter turns from prior program.       PT Education - 05/15/16 2138    Education provided Yes   Education Details HEP: bridging with marches, plank position, hamstring stretch, piriformis stretch, heel cord stretch   Person(s) Educated Patient   Methods Explanation;Demonstration;Handout   Comprehension Returned demonstration;Verbalized understanding          PT Short Term Goals - 05/15/16 1155    PT SHORT TERM GOAL #1   Title The patient will be indep with HEP for gaze adaptation and habituation.    Baseline Target date 04/28/2016   Time 4   Period Weeks   Status Achieved   PT SHORT TERM GOAL #2   Title The patient will tolerate gaze x 1 viewing x 30 seconds with no increase in dizziness from baseline.   Baseline Met per report on 05/15/2016.   Time 4   Period Weeks   Status Achieved   PT SHORT TERM GOAL #3   Title The patient will be further assessed on SOT and DGI and goals to follow, if indicated.   Baseline Not indicated.   Time 4   Period Weeks   Status Deferred  PT SHORT TERM GOAL #4   Title The patient will tolerate sit>right sidelying with subjective complaints of head fullness < or equal to 3/10.   Baseline Met on 04/19/2016- with c/o "cotton head" sensation x 2/10.   Time 4   Period Weeks   Status Achieved           PT Long Term Goals - 05/15/16 1156    PT LONG TERM GOAL #1   Title The patient will be indep with progression of HEP for post d/c.   Baseline Target date 05/28/2016   Time 8   Period Weeks   PT LONG TERM GOAL #2   Title The patient will tolerate gaze x 1 viewing x 60 seconds without c/o dizziness.    Baseline Target date 05/28/2016   Time 8   Period Weeks   PT LONG TERM GOAL #3   Title The patient will tolerate bed mobility without  dizziness or c/o head fullness.   Baseline Target date 05/28/2016   Time 8   Period Weeks   Status Achieved   PT LONG TERM GOAL #4   Title The patient will improve DHI from 66% to < or equal to 50% to demo dec'd self perception of dizziness.   Baseline Target date 05/28/2016   Time 8   Period Weeks   PT LONG TERM GOAL #5   Title The patient will subjectively report return to driving and work tasks without dizziness limiting function.   Baseline Target date 05/28/2016   Time 8   Period Weeks   PT LONG TERM GOAL #6   Title Further SOT and DGI goals to follow, as indicated   Baseline Target date 05/28/2016   Time 8   Period Weeks               Plan - 05/15/16 2139    Clinical Impression Statement The patient met 3 STGs and 1 LTG.  She is experiencing increased fatigue and discomfort in legs and back.  PT addressed with HEP and will f/u in 2 weeks to determine how program progressing. Will update plan of care as needed at next visit.   Rehab Potential Good   PT Frequency 1x / week   PT Duration 4 weeks   PT Treatment/Interventions ADLs/Self Care Home Management;Balance training;Neuromuscular re-education;Gait training;Stair training;Therapeutic activities;Therapeutic exercise;Canalith Repostioning;Vestibular;Patient/family education   PT Next Visit Plan Check LTGs, provide MS resources in writing   Consulted and Agree with Plan of Care Patient      Patient will benefit from skilled therapeutic intervention in order to improve the following deficits and impairments:  Abnormal gait, Decreased balance, Dizziness  Visit Diagnosis: Dizziness and giddiness  Other abnormalities of gait and mobility     Problem List Patient Active Problem List   Diagnosis Date Noted  . High risk medication use 03/31/2016  . Insomnia 03/31/2016  . Dizziness   . Syncope   . Multiple sclerosis ([place]) 03/21/2016  . MS (multiple sclerosis) ([place])   . Vertigo   . White matter abnormality on MRI  of brain 03/20/2016  . Faintness     Crandall Harvel, PT 05/15/2016, 9:44 PM  [facility] [address] [place], [place], [address] Phone: [phone]   Fax:  [phone]  Name: Virginia [name] MRN: [id number] Date of Birth: 21-Aug-1981

## 2016-05-15 NOTE — Patient Instructions (Addendum)
Bridge Pose, One Leg    Lying on back.  Lift hips in a bridge position.  Hold hips up while marching and perform 5 times on each side.  Rest and repeat 2 more times (of 5 reps).   Can do 2 times per day.  Copyright  VHI. All rights reserved.   Heel Cord Stretch    Place one leg forward, bent, other leg behind and straight. Lean forward keeping back heel flat. Hold _30___ seconds while counting out loud. Repeat with other leg. Repeat _2___ times. Do ___2_ sessions per day.  http://gt2.exer.us/512   Copyright  VHI. All rights reserved.   Hamstring Stretch    Reach down along right leg until a comfortable stretch is felt in back of thigh. Be sure to keep knee straight. Hold __30__ seconds. Repeat __2__ times per set. Do __1__ sets per session. Do _2___ sessions per day.  http://orth.exer.us/157   Copyright  VHI. All rights reserved.   Piriformis Stretch, Sitting    Sit, back straight, one leg straight, other leg bent, ankle on opposite thigh. Grasp knee and ankle of crossed leg. Pull leg toward trunk. Feel stretch in gluteals. Hold _30__ seconds.  Repeat _2__ times per session. Do _2__ sessions per day.  Copyright  VHI. All rights reserved.   Prone Plank (Eccentric)    On toes and elbows, pull abdomen in while stabilizing trunk. Slowly lower downward without arching back. _5__ reps per set, _1__ sets per day, _5__ days per week.  **begin with knee down and just raise hips and stomach up from ground.  http://ecce.exer.us/243   Copyright  VHI. All rights reserved.

## 2016-05-15 NOTE — Telephone Encounter (Signed)
Rx. awaiting [facility] sig, then [facility]/fim

## 2016-05-17 NOTE — Telephone Encounter (Signed)
[facility] on 05-15-16/fim

## 2016-05-18 ENCOUNTER — Telehealth: Payer: Self-pay | Admitting: *Deleted

## 2016-05-18 NOTE — Telephone Encounter (Signed)
LMTC.  Need to discuss FMLA/disability paperwork/fim

## 2016-05-19 NOTE — Telephone Encounter (Signed)
LMOM cell # for pt. to call/fim

## 2016-05-19 NOTE — Telephone Encounter (Signed)
LMOM home #.  I need to talk to her about her FMLA paperwork/fim

## 2016-05-22 NOTE — Telephone Encounter (Signed)
LMTC./fim 

## 2016-05-22 NOTE — Telephone Encounter (Signed)
Patient is returning your call and requests call back [phone]. Thanks!

## 2016-05-23 NOTE — Telephone Encounter (Signed)
Paperwork has been sent back to [employer] Records/fim

## 2016-05-23 NOTE — Telephone Encounter (Signed)
I have made mult. attempts to contact pt. to discuss FMLA paperwork and also disability paperwork that she dropped off.  I am not able to complete paperwork without speaking with her.  She should make an appt. with RAS to discuss/fim

## 2016-05-23 NOTE — Telephone Encounter (Signed)
LMTC./fim 

## 2016-05-25 NOTE — Telephone Encounter (Signed)
Pt returned call , was told to come in to discuss paper work . Pt has appt for 8/2 already scheduled. Nurse was notified.

## 2016-05-25 NOTE — Telephone Encounter (Signed)
Noted/fim 

## 2016-05-29 ENCOUNTER — Ambulatory Visit: Payer: Managed Care, Other (non HMO) | Admitting: Rehabilitative and Restorative Service Providers"

## 2016-05-31 ENCOUNTER — Ambulatory Visit (INDEPENDENT_AMBULATORY_CARE_PROVIDER_SITE_OTHER): Payer: Managed Care, Other (non HMO) | Admitting: Neurology

## 2016-05-31 ENCOUNTER — Encounter: Payer: Self-pay | Admitting: Neurology

## 2016-05-31 VITALS — BP 119/86 | HR 77 | Ht 64.0 in | Wt 202.0 lb

## 2016-05-31 DIAGNOSIS — G4700 Insomnia, unspecified: Secondary | ICD-10-CM

## 2016-05-31 DIAGNOSIS — R260 Ataxic gait: Secondary | ICD-10-CM

## 2016-05-31 DIAGNOSIS — G35 Multiple sclerosis: Secondary | ICD-10-CM

## 2016-05-31 DIAGNOSIS — G35D Multiple sclerosis, unspecified: Secondary | ICD-10-CM

## 2016-05-31 DIAGNOSIS — R42 Dizziness and giddiness: Secondary | ICD-10-CM

## 2016-05-31 DIAGNOSIS — R5383 Other fatigue: Secondary | ICD-10-CM

## 2016-05-31 DIAGNOSIS — R208 Other disturbances of skin sensation: Secondary | ICD-10-CM | POA: Diagnosis not present

## 2016-05-31 NOTE — Progress Notes (Signed)
[facility]  PATIENT: Virginia [name] DOB: Dec 12, 1980  REFERRING DOCTOR OR PCP:    Blair Heys SOURCE: patient, admit/discharge hospital notes, imaging reports, MRI's on PACS  _________________________________   HISTORICAL  CHIEF COMPLAINT:  Chief Complaint  Patient presents with  . Multiple Sclerosis    She is here with her mother, [name].  She is still having back pain but it did improved with oral steroids.  She is concerned about having balance issues.  Also, reports having headaches, nausea and intermittent diarrhea since starting Aubagio.    HISTORY OF PRESENT ILLNESS:  Virginia [name] is a 35 yo woman with multiple sclerosis and dizziness.  She has been on Aubagio x 2 weeks.   She has had nausea and intermittent diarrhea since starting Aubagio.  Over the last 2 weeks, however, she has had more dizzines, headache and balance difficulty.     Gait/strength/sensation: Gait is off balance.   Balance is worse in the dark and when she closes her eyes.  .   She is not veering as much with walking. Although there is constant weakness or numbness she will get some tingling in her toes.    She has had burning sensation in her face since shortly after her other symptoms.,   This is bilateral.   She does not have actual numbness.     She denies facial weakness  Bladder/bowel: She has noted urinary frequency and urgency but has not having incontinence. There is no constipation.  Vision: She denies blurry vision or diplopia now.    Fatigue/sleep: For the past 6 months, she has felt much more fatigue than she used to. She has some insomnia is been a more chronic problem for her. Temazepam helps her fall asleep well.   She still wakes up buut quickly falls asleep.  Mood/cognition: She has some irritability / moodiness and a little bit of anxiety..  She denies any significant change in cognitive function. She is not having significant problems with memory, focus or verbal  fluency.  MS History:   In mid May 2017, she was starting to experience occasional lightheadedness and a spinning vertigo. On 03/19/2016, she had more extreme vertigo and had an episode of syncope. She notices that when she was walking she would veer towards the right. Because of the syncope, she was taken to the emergency room. He had an MRI of the brain that showed several enhancing lesions, potentially worrisome for multiple sclerosis. Additional studies were performed. She received 3 days of IV steroids. The MRI's of the spine did not show any additional plaques. The lumbar puncture showed oligoclonal bands with normal IgG index, consistent with multiple sclerosis.  MRI images from 03/20/2016, 03/22/2016 and CT angiogram images from 03/23/2016 were personally reviewed. The MRI of the brain shows several 2/fair hyperintense foci, some in the periventricular and juxtacortical white matter. Most of the foci enhanced after contrast administration. MRI of the cervical and thoracic spine did not show any spinal cord plaques and there was no significant degenerative change.   CT angiogram was essentially normal showing no significant stenosis.   She underwent a lumbar puncture on 03/21/2016. 4 oligoclonal bands present in the CSF which were not present in the serum, consistent with multiple sclerosis. The IgG index was high normal at 0.6.   There were only 4 white blood cells but 280 red blood cells more consistent with a slightly traumatic tap.    [facility] Spotted Fever CSF IgG was negative but  the IgM was positive at 1.41 (less than 0.9 normal).    ANA and ANCA were both negative.    REVIEW OF SYSTEMS: Constitutional: No fevers, chills, sweats, or change in appetite.   He has fatigue and also reports insomnia Eyes: No visual changes, double vision, eye pain Ear, nose and throat: No hearing loss, ear pain, nasal congestion, sore throat Cardiovascular: No chest pain, palpitations Respiratory:  No shortness of breath at rest or with exertion.   No wheezes GastrointestinaI: No nausea, vomiting, diarrhea, abdominal pain, fecal incontinence Genitourinary: No dysuria, urinary retention.   She reports frequency.  No nocturia. Musculoskeletal: No neck pain, back pain Integumentary: No rash, pruritus, skin lesions Neurological: as above Psychiatric: No depression at this time.  No anxiety Endocrine: No palpitations, diaphoresis, change in appetite, change in weigh or increased thirst Hematologic/Lymphatic: No anemia, purpura, petechiae. Allergic/Immunologic: No itchy/runny eyes, nasal congestion, recent allergic reactions, rashes  ALLERGIES: Allergies  Allergen Reactions  . Codeine Other (See Comments)    Hallucinations     HOME MEDICATIONS:  Current Outpatient Prescriptions:  .  naproxen sodium (ANAPROX) 220 MG tablet, Take 220 mg by mouth 2 (two) times daily as needed (for pain)., Disp: , Rfl:  .  temazepam (RESTORIL) 30 MG capsule, Take 1 capsule (30 mg total) by mouth at bedtime as needed for sleep., Disp: 30 capsule, Rfl: 2 .  Teriflunomide 14 MG TABS, Take 14 mg by mouth daily., Disp: , Rfl:   PAST MEDICAL HISTORY: Past Medical History:  Diagnosis Date  . Anxiety   . Headache   . Multiple sclerosis (HCC)   . Syncope and collapse     PAST SURGICAL HISTORY: History reviewed. No pertinent surgical history.  FAMILY HISTORY: Family History  Problem Relation Age of Onset  . Emphysema Mother   . Osteoarthritis Mother   . Other Father   . ALS Maternal Grandmother     SOCIAL HISTORY:  Social History   Social History  . Marital status: Married    Spouse name: N/A  . Number of children: N/A  . Years of education: N/A   Occupational History  . Not on file.   Social History Main Topics  . Smoking status: Current Every Day Smoker    Types: E-cigarettes  . Smokeless tobacco: Not on file  . Alcohol use 0.0 oz/week     Comment: rarely  . Drug use: No  .  Sexual activity: Not on file   Other Topics Concern  . Not on file   Social History Narrative  . No narrative on file     PHYSICAL EXAM  Vitals:   05/31/16 1047  BP: 119/86  Pulse: 77  Weight: 202 lb (91.6 kg)  Height: 5\' 4"  (1.626 m)    Body mass index is 34.67 kg/m.   General: The patient is well-developed and well-nourished and in no acute distress  Eyes:  Funduscopic exam shows normal optic discs and retinal vessels.  Neck: The neck is supple .  The neck is nontender.  Ears:   She has cerumen bilaterally  Skin: Extremities are without significant edema.  Musculoskeletal:  Back is nontender  Neurologic Exam  Mental status: The patient is alert and oriented x 3 at the time of the examination. The patient has apparent normal recent and remote memory, with an apparently normal attention span and concentration ability.   Speech is normal.  Cranial nerves: Extraocular movements are normal.  Facial symmetry is present. There is good facial sensation  to soft touch bilaterally.Facial strength is normal.  Trapezius and sternocleidomastoid strength is normal. No dysarthria is noted.  The tongue is midline, and the patient has symmetric elevation of the soft palate. No obvious hearing deficits are noted.  Motor:  Muscle bulk is normal.   Tone is normal. Strength is  5 / 5 in all 4 extremities.   Sensory: Sensory testing is intact to pinprick, soft touch and vibration sensation in all 4 extremities.  Coordination: Cerebellar testing reveals good finger-nose-finger and heel-to-shin bilaterally.  Gait and station: Station is normal.   Gait is normal. Tandem gait is wide. Romberg is negative.   Reflexes: Deep tendon reflexes are symmetric and normal bilaterally (brisk at knees but no spread; no clonus).   Plantar responses are flexor.    DIAGNOSTIC DATA (LABS, IMAGING, TESTING) - I reviewed patient records, labs, notes, testing and imaging myself where available.  Lab  Results  Component Value Date   WBC 14.2 (H) 03/19/2016   HGB 14.5 03/19/2016   HCT 42.6 03/19/2016   MCV 90.8 03/19/2016   PLT 335 03/19/2016      Component Value Date/Time   NA 137 03/19/2016 1929   K 3.7 03/19/2016 1929   CL 110 03/19/2016 1929   CO2 18 (L) 03/19/2016 1929   GLUCOSE 100 (H) 03/19/2016 1929   BUN 16 03/19/2016 1929   CREATININE 0.74 03/19/2016 1929   CALCIUM 8.9 03/19/2016 1929   PROT 6.7 02/24/2016 1141   ALBUMIN 4.1 03/21/2016 1530   AST 12 (L) 02/24/2016 1141   ALT 13 (L) 02/24/2016 1141   ALKPHOS 61 02/24/2016 1141   BILITOT 0.4 02/24/2016 1141   GFRNONAA >60 03/19/2016 1929   GFRAA >60 03/19/2016 1929    Lab Results  Component Value Date   TSH 0.635 02/24/2016       ASSESSMENT AND PLAN  Multiple sclerosis (HCC) - Plan: Hepatic function panel  Vertigo  Insomnia  Ataxic gait  Other fatigue  Dysesthesia   1.   Continue Aubagio. We will check monthly liver function tests for the next 6 months. If she is still unable to tolerate the medicine in another few weeks, we will need to consider a different one for her. 2.    Get OTC drops for ear wax. 3.    Try to stay active and exercises as tolerated. 4.    Return in 3 months or sooner if there are new or worsening neurologic symptoms.  Tamer Baughman A. Epimenio Foot, MD, PhD 05/31/2016, 4:03 PM Certified in Neurology, Clinical Neurophysiology, Sleep Medicine, Pain Medicine and Neuroimaging  [facility] [address] [place], [place] [address] [phone]

## 2016-06-19 ENCOUNTER — Telehealth: Payer: Self-pay | Admitting: *Deleted

## 2016-06-19 MED ORDER — TERIFLUNOMIDE 14 MG PO TABS: 14.0000 mg | ORAL_TABLET | Freq: Every day | ORAL | 0 refills | Status: DC

## 2016-06-19 NOTE — Telephone Encounter (Signed)
Received fax from [facility] requesting a rx. for a 28 day supply of Aubagio.  Apparently, pt. has lost her job/insurance and is applying for pt. assistance thru [employer]'s PAP program.  Rx. faxed to [facility] at fax# [phone], as requested/fim

## 2016-06-21 ENCOUNTER — Encounter: Payer: Self-pay | Admitting: *Deleted

## 2016-06-23 ENCOUNTER — Telehealth: Payer: Self-pay | Admitting: *Deleted

## 2016-06-23 MED ORDER — TERIFLUNOMIDE 14 MG PO TABS: 14.0000 mg | ORAL_TABLET | Freq: Every day | ORAL | 12 refills | Status: DC

## 2016-06-23 NOTE — Telephone Encounter (Signed)
Pt. is receiving Aubagio from [facility].  Per faxed request, Aubagio rx. faxed to [facility] fax# [phone]/fim

## 2016-07-13 ENCOUNTER — Telehealth: Payer: Self-pay | Admitting: Neurology

## 2016-07-13 NOTE — Telephone Encounter (Signed)
Patient called to advise of really bad black out issues, 1 or 2 per week, sharp pains shooting down legs. Please call [phone].

## 2016-07-13 NOTE — Telephone Encounter (Signed)
I have spoken with Virginia [name] this afternoon.  She sts. she is having brief episodes of altered loc.  Does not lose consciousness, just awareness of surroundings.  Usually events are witnessed by mother.  Mother sts. she just stares.  Appt. given to discuss with RAS/fim

## 2016-07-19 ENCOUNTER — Ambulatory Visit (INDEPENDENT_AMBULATORY_CARE_PROVIDER_SITE_OTHER): Payer: Self-pay | Admitting: Neurology

## 2016-07-19 ENCOUNTER — Encounter: Payer: Self-pay | Admitting: Neurology

## 2016-07-19 VITALS — BP 116/82 | HR 70 | Resp 14 | Ht 64.0 in | Wt 201.5 lb

## 2016-07-19 DIAGNOSIS — R260 Ataxic gait: Secondary | ICD-10-CM

## 2016-07-19 DIAGNOSIS — G35 Multiple sclerosis: Secondary | ICD-10-CM

## 2016-07-19 DIAGNOSIS — R208 Other disturbances of skin sensation: Secondary | ICD-10-CM

## 2016-07-19 DIAGNOSIS — R404 Transient alteration of awareness: Secondary | ICD-10-CM

## 2016-07-19 DIAGNOSIS — G4700 Insomnia, unspecified: Secondary | ICD-10-CM

## 2016-07-19 DIAGNOSIS — R5383 Other fatigue: Secondary | ICD-10-CM

## 2016-07-19 DIAGNOSIS — Z79899 Other long term (current) drug therapy: Secondary | ICD-10-CM

## 2016-07-19 MED ORDER — TEMAZEPAM 30 MG PO CAPS: 30.0000 mg | ORAL_CAPSULE | Freq: Every evening | ORAL | 5 refills | Status: DC | PRN

## 2016-07-19 MED ORDER — BUPROPION HCL ER (XL) 300 MG PO TB24: 300.0000 mg | ORAL_TABLET | Freq: Every day | ORAL | 5 refills | Status: DC

## 2016-07-19 NOTE — Progress Notes (Signed)
[facility]  PATIENT: Virginia [name] DOB: 1981/07/17  REFERRING DOCTOR OR PCP:    Blair Heys SOURCE: patient, admit/discharge hospital notes, imaging reports, MRI's on PACS  _________________________________   HISTORICAL  CHIEF COMPLAINT:  Chief Complaint  Patient presents with  . Multiple Sclerosis    Sts. she continues to tolerate Aubagio with some nausea.  She is here with her mother today, sts. she has had 2 episodes of ? falling asleep or decreased loc.  Mother sts. in first episode, Virginia [name] was sitting on the sofa, was leaned over, apparently asleep.  She was responsive with physical stimuli and was immed. alert, oriented.  Sts. during the 2nd episode, she was lying in bed and husband had a difficult time waking her.  She also c/o increased moodiness and difficulty with memory./fim    HISTORY OF PRESENT ILLNESS:  Virginia [name] is a 35 yo woman with multiple sclerosis and dizziness.    MS:  She has been on Aubagio x 3 months .     She has nausea intermittent but diarrhea is better.   Nausea not as bad but still daily.   Over the last 2 weeks, however, she has had more dizzines, headache and balance difficulty.     Spells:    She has had at least 2 spells of extreme sleepiness where she is diifficult to arouse but then immediately gets back to baseline. Husband took about 10 minutes to wake her up the one episodes and mother took about 5 minutes.    She is not feeling sleepy before these episodes and generally is not feeling sleepy.    She is sleeping well at night with temazepam.   She denies a hangover.      Gait/strength/sensation: Gait is off balance.   Balance is worse in the dark and when she closes her eyes.  .   She is not veering as much with walking. Although there is constant weakness or numbness she will get some tingling in her toes.    She has had burning sensation in her face since shortly after her other symptoms.      She denies facial weakness.     She was having lightning sensations down her leg.   Bladder/bowel: She has noted urinary frequency and urgency but has not having incontinence. There is no constipation.  Vision: She denies blurry vision or diplopia now.    Fatigue/sleep: She has only a litttle fatigue or sleepiness.   .She has some insomnia is been a more chronic problem for her. Temazepam helps her fall asleep well.   She still wakes up buut quickly falls asleep.  Mood/cognition: She has increased irritability / moodiness and a little bit of anxiety..  She denies any significant change in cognitive function. She notes more difficulty with memory, focus or verbal fluency.      MS History:   In mid May 2017, she was starting to experience occasional lightheadedness and a spinning vertigo. On 03/19/2016, she had more extreme vertigo and had an episode of syncope. She notices that when she was walking she would veer towards the right. Because of the syncope, she was taken to the emergency room. He had an MRI of the brain that showed several enhancing lesions, potentially worrisome for multiple sclerosis. Additional studies were performed. She received 3 days of IV steroids. The MRI's of the spine did not show any additional plaques. The lumbar puncture showed oligoclonal bands with normal IgG index, consistent with multiple  sclerosis.  MRI images from 03/20/2016, 03/22/2016 and CT angiogram images from 03/23/2016 were personally reviewed. The MRI of the brain shows several 2/fair hyperintense foci, some in the periventricular and juxtacortical white matter. Most of the foci enhanced after contrast administration. MRI of the cervical and thoracic spine did not show any spinal cord plaques and there was no significant degenerative change.   CT angiogram was essentially normal showing no significant stenosis.   She underwent a lumbar puncture on 03/21/2016. 4 oligoclonal bands present in the CSF which were not present in the serum,  consistent with multiple sclerosis. The IgG index was high normal at 0.6.   There were only 4 white blood cells but 280 red blood cells more consistent with a slightly traumatic tap.    [facility] Spotted Fever CSF IgG was negative but the IgM was positive at 1.41 (less than 0.9 normal).    ANA and ANCA were both negative.    REVIEW OF SYSTEMS: Constitutional: No fevers, chills, sweats, or change in appetite.   She has fatigue. Insomnia much better with temazepam Eyes: No visual changes, double vision, eye pain Ear, nose and throat: No hearing loss, ear pain, nasal congestion, sore throat Cardiovascular: No chest pain, palpitations Respiratory: No shortness of breath at rest or with exertion.   No wheezes GastrointestinaI: No nausea, vomiting, diarrhea, abdominal pain, fecal incontinence Genitourinary: No dysuria, urinary retention.   She reports frequency.  No nocturia. Musculoskeletal: No neck pain, back pain Integumentary: No rash, pruritus, skin lesions Neurological: as above Psychiatric: No depression at this time.  No anxiety Endocrine: No palpitations, diaphoresis, change in appetite, change in weigh or increased thirst Hematologic/Lymphatic: No anemia, purpura, petechiae. Allergic/Immunologic: No itchy/runny eyes, nasal congestion, recent allergic reactions, rashes  ALLERGIES: Allergies  Allergen Reactions  . Codeine Other (See Comments)    Hallucinations     HOME MEDICATIONS:  Current Outpatient Prescriptions:  .  naproxen sodium (ANAPROX) 220 MG tablet, Take 220 mg by mouth 2 (two) times daily as needed (for pain)., Disp: , Rfl:  .  temazepam (RESTORIL) 30 MG capsule, Take 1 capsule (30 mg total) by mouth at bedtime as needed for sleep., Disp: 30 capsule, Rfl: 5 .  Teriflunomide 14 MG TABS, Take 14 mg by mouth daily., Disp: 28 tablet, Rfl: 12 .  buPROPion (WELLBUTRIN XL) 300 MG 24 hr tablet, Take 1 tablet (300 mg total) by mouth daily., Disp: 30 tablet, Rfl:  5  PAST MEDICAL HISTORY: Past Medical History:  Diagnosis Date  . Anxiety   . Headache   . Multiple sclerosis (HCC)   . Syncope and collapse     PAST SURGICAL HISTORY: No past surgical history on file.  FAMILY HISTORY: Family History  Problem Relation Age of Onset  . Emphysema Mother   . Osteoarthritis Mother   . Other Father   . ALS Maternal Grandmother     SOCIAL HISTORY:  Social History   Social History  . Marital status: Married    Spouse name: N/A  . Number of children: N/A  . Years of education: N/A   Occupational History  . Not on file.   Social History Main Topics  . Smoking status: Current Every Day Smoker    Types: E-cigarettes  . Smokeless tobacco: Not on file  . Alcohol use 0.0 oz/week     Comment: rarely  . Drug use: No  . Sexual activity: Not on file   Other Topics Concern  . Not on file  Social History Narrative  . No narrative on file     PHYSICAL EXAM  Vitals:   07/19/16 1049  BP: 116/82  Pulse: 70  Resp: 14  Weight: 201 lb 8 oz (91.4 kg)  Height: 5\' 4"  (1.626 m)    Body mass index is 34.59 kg/m.   General: The patient is well-developed and well-nourished and in no acute distress  Eyes:  Funduscopic exam shows normal optic discs and retinal vessels.  Neck: The neck is supple .  The neck is nontender.  Ears:   She has cerumen bilaterally  Skin: Extremities are without significant edema.  Musculoskeletal:  Back is nontender  Neurologic Exam  Mental status: The patient is alert and oriented x 3 at the time of the examination. The patient has apparent normal recent and remote memory, with an apparently normal attention span and concentration ability.   Speech is normal.  Cranial nerves: Extraocular movements are normal.  Facial symmetry is present. There is good facial sensation to soft touch bilaterally.Facial strength is normal.  Trapezius and sternocleidomastoid strength is normal. No dysarthria is noted.  The tongue  is midline, and the patient has symmetric elevation of the soft palate. No obvious hearing deficits are noted.  Motor:  Muscle bulk is normal.   Tone is normal. Strength is  5 / 5 in all 4 extremities.   Sensory: Sensory testing is intact to pinprick, soft touch and vibration sensation in all 4 extremities.  Coordination: Cerebellar testing reveals good finger-nose-finger and heel-to-shin bilaterally.  Gait and station: Station is normal.   Gait is normal. Tandem gait is wide. Romberg is negative.   Reflexes: Deep tendon reflexes are symmetric and normal bilaterally (brisk at knees but no spread; no clonus).   Plantar responses are flexor.    DIAGNOSTIC DATA (LABS, IMAGING, TESTING) - I reviewed patient records, labs, notes, testing and imaging myself where available.  Lab Results  Component Value Date   WBC 14.2 (H) 03/19/2016   HGB 14.5 03/19/2016   HCT 42.6 03/19/2016   MCV 90.8 03/19/2016   PLT 335 03/19/2016      Component Value Date/Time   NA 137 03/19/2016 1929   K 3.7 03/19/2016 1929   CL 110 03/19/2016 1929   CO2 18 (L) 03/19/2016 1929   GLUCOSE 100 (H) 03/19/2016 1929   BUN 16 03/19/2016 1929   CREATININE 0.74 03/19/2016 1929   CALCIUM 8.9 03/19/2016 1929   PROT 6.7 02/24/2016 1141   ALBUMIN 4.1 03/21/2016 1530   AST 12 (L) 02/24/2016 1141   ALT 13 (L) 02/24/2016 1141   ALKPHOS 61 02/24/2016 1141   BILITOT 0.4 02/24/2016 1141   GFRNONAA >60 03/19/2016 1929   GFRAA >60 03/19/2016 1929    Lab Results  Component Value Date   TSH 0.635 02/24/2016       ASSESSMENT AND PLAN  Multiple sclerosis (HCC) - Plan: CBC with Differential/Platelet, Hepatic function panel  High risk medication use  Insomnia  Ataxic gait  Other fatigue  Dysesthesia  Spell of altered consciousness   1.   Continue Aubagio. Check Labs to rule out hepatotoxicity and lymphopenia..  Move pill to dinner with food.   At next visit, check MRI brain to rule out subclinical  progression. 2.   Wellbutrin for mood, may also help mild fatigue/focus 3.   Try to stay active and exercises as tolerated. 4.    If more spells will check EEG.     Return in 4 months or  sooner if there are new or worsening neurologic symptoms.  Cartha Rotert A. Epimenio Foot, MD, PhD 07/19/2016, 2:14 PM Certified in Neurology, Clinical Neurophysiology, Sleep Medicine, Pain Medicine and Neuroimaging  [facility] [address] [place], [place] [address] [phone]

## 2016-07-20 ENCOUNTER — Telehealth: Payer: Self-pay | Admitting: *Deleted

## 2016-07-20 LAB — HEPATIC FUNCTION PANEL
ALK PHOS: 78 IU/L (ref 39–117)
ALT: 11 IU/L (ref 0–32)
AST: 12 IU/L (ref 0–40)
Albumin: 4.2 g/dL (ref 3.5–5.5)
BILIRUBIN, DIRECT: 0.06 mg/dL (ref 0.00–0.40)
Bilirubin Total: 0.3 mg/dL (ref 0.0–1.2)
Total Protein: 6.4 g/dL (ref 6.0–8.5)

## 2016-07-20 LAB — CBC WITH DIFFERENTIAL/PLATELET
BASOS: 1 %
Basophils Absolute: 0 10*3/uL (ref 0.0–0.2)
EOS (ABSOLUTE): 0.2 10*3/uL (ref 0.0–0.4)
Eos: 4 %
Hematocrit: 41.4 % (ref 34.0–46.6)
Hemoglobin: 14 g/dL (ref 11.1–15.9)
IMMATURE GRANS (ABS): 0 10*3/uL (ref 0.0–0.1)
Immature Granulocytes: 0 %
LYMPHS ABS: 1.9 10*3/uL (ref 0.7–3.1)
LYMPHS: 31 %
MCH: 31 pg (ref 26.6–33.0)
MCHC: 33.8 g/dL (ref 31.5–35.7)
MCV: 92 fL (ref 79–97)
Monocytes Absolute: 0.6 10*3/uL (ref 0.1–0.9)
Monocytes: 10 %
NEUTROS ABS: 3.4 10*3/uL (ref 1.4–7.0)
Neutrophils: 54 %
PLATELETS: 289 10*3/uL (ref 150–379)
RBC: 4.51 x10E6/uL (ref 3.77–5.28)
RDW: 12.6 % (ref 12.3–15.4)
WBC: 6.2 10*3/uL (ref 3.4–10.8)

## 2016-07-20 NOTE — Telephone Encounter (Signed)
LMOM that per RAS, labs done in our office yesterday are normal.  She does not have to return this call unless she has questions/fim

## 2016-07-20 NOTE — Telephone Encounter (Signed)
-----   Message from Asa Lente, MD sent at 07/20/2016  8:37 AM EDT ----- Please let her know that the labs are normal.

## 2016-07-31 ENCOUNTER — Ambulatory Visit: Payer: Self-pay | Admitting: Neurology

## 2016-08-16 ENCOUNTER — Telehealth: Payer: Self-pay | Admitting: Neurology

## 2016-08-16 NOTE — Telephone Encounter (Signed)
Pt called said she passed out last Thursday 10/12 awoke on the kitchen floor.  Also she wanted to know what she can take for allergies with current meds she is on. She said her eyes are swelling, nose is running, a little cough, sneezing.

## 2016-08-16 NOTE — Telephone Encounter (Signed)
I have spoken with Virginia [name] this afternoon.  She c/o one syncopal episode last Thursday, that she is not able to identify a trigger for.  No further episodes.  She c/o nasal congestion, cough.  I advised she see her pcp for tx. options related to cold sx, and advised she call back if she has further syncopal episodes/fim

## 2016-09-05 ENCOUNTER — Ambulatory Visit: Payer: Managed Care, Other (non HMO) | Admitting: Neurology

## 2016-09-25 ENCOUNTER — Encounter: Payer: Self-pay | Admitting: Rehabilitative and Restorative Service Providers"

## 2016-09-25 ENCOUNTER — Encounter: Payer: Self-pay | Admitting: Neurology

## 2016-09-25 NOTE — Therapy (Signed)
[facility] [address] [place], [place], [address] Phone: [phone]   Fax:  [phone]  Patient Details  Name: Virginia [name] MRN: [id number] Date of Birth: 05/26/1981 Referring Provider:  Arlice Colt, MD  Encounter Date: Last encounter 05/15/2016  PHYSICAL THERAPY DISCHARGE SUMMARY  Visits from Start of Care: 5  Current functional level related to goals / functional outcomes:     PT Short Term Goals - 05/15/16 1155      PT SHORT TERM GOAL #1   Title The patient will be indep with HEP for gaze adaptation and habituation.    Baseline Target date 04/28/2016   Time 4   Period Weeks   Status Achieved     PT SHORT TERM GOAL #2   Title The patient will tolerate gaze x 1 viewing x 30 seconds with no increase in dizziness from baseline.   Baseline Met per report on 05/15/2016.   Time 4   Period Weeks   Status Achieved     PT SHORT TERM GOAL #3   Title The patient will be further assessed on SOT and DGI and goals to follow, if indicated.   Baseline Not indicated.   Time 4   Period Weeks   Status Deferred     PT SHORT TERM GOAL #4   Title The patient will tolerate sit>right sidelying with subjective complaints of head fullness < or equal to 3/10.   Baseline Met on 04/19/2016- with c/o "cotton head" sensation x 2/10.   Time 4   Period Weeks   Status Achieved         PT Long Term Goals - 05/15/16 1156      PT LONG TERM GOAL #1   Title The patient will be indep with progression of HEP for post d/c.   Baseline Target date 05/28/2016   Time 8   Period Weeks     PT LONG TERM GOAL #2   Title The patient will tolerate gaze x 1 viewing x 60 seconds without c/o dizziness.    Baseline Target date 05/28/2016   Time 8   Period Weeks     PT LONG TERM GOAL #3   Title The patient will tolerate bed mobility without dizziness or c/o head fullness.   Baseline Target date 05/28/2016   Time 8   Period Weeks   Status  Achieved     PT LONG TERM GOAL #4   Title The patient will improve DHI from 66% to < or equal to 50% to demo dec'd self perception of dizziness.   Baseline Target date 05/28/2016   Time 8   Period Weeks     PT LONG TERM GOAL #5   Title The patient will subjectively report return to driving and work tasks without dizziness limiting function.   Baseline Target date 05/28/2016   Time 8   Period Weeks     PT LONG TERM GOAL #6   Title Further SOT and DGI goals to follow, as indicated   Baseline Target date 05/28/2016   Time 8   Period Weeks        Remaining deficits: Full goals not reassessed due to patient not returning.  Initial dizziness was improved, however fatigue worsening at last visit.   Education / Equipment: HEP.  Plan: Patient agrees to discharge.  Patient goals were partially met. Patient is being discharged due to not returning since the last visit.  ?????  Thank you for the referral of this patient. Rudell Cobb, MPT   Virginia [name] 09/25/2016, 10:48 AM  [facility] [address] [place], [place], [address] Phone: [phone]   Fax:  [phone]

## 2016-09-26 ENCOUNTER — Telehealth: Payer: Self-pay | Admitting: *Deleted

## 2016-09-26 MED ORDER — BACLOFEN 10 MG PO TABS: 10.0000 mg | ORAL_TABLET | Freq: Three times a day (TID) | ORAL | 0 refills | Status: DC

## 2016-09-26 NOTE — Telephone Encounter (Signed)
See pt's email.  Baclofen escribed for leg cramps/fim

## 2016-11-21 ENCOUNTER — Ambulatory Visit (INDEPENDENT_AMBULATORY_CARE_PROVIDER_SITE_OTHER): Payer: Managed Care, Other (non HMO) | Admitting: Neurology

## 2016-11-21 ENCOUNTER — Encounter: Payer: Self-pay | Admitting: Neurology

## 2016-11-21 VITALS — BP 118/76 | HR 78 | Resp 18 | Ht 64.0 in | Wt 195.0 lb

## 2016-11-21 DIAGNOSIS — G35 Multiple sclerosis: Secondary | ICD-10-CM

## 2016-11-21 DIAGNOSIS — R404 Transient alteration of awareness: Secondary | ICD-10-CM | POA: Diagnosis not present

## 2016-11-21 DIAGNOSIS — R5383 Other fatigue: Secondary | ICD-10-CM

## 2016-11-21 DIAGNOSIS — Z79899 Other long term (current) drug therapy: Secondary | ICD-10-CM | POA: Diagnosis not present

## 2016-11-21 DIAGNOSIS — R260 Ataxic gait: Secondary | ICD-10-CM | POA: Diagnosis not present

## 2016-11-21 MED ORDER — METOCLOPRAMIDE HCL 5 MG PO TABS
5.0000 mg | ORAL_TABLET | Freq: Three times a day (TID) | ORAL | 1 refills | Status: DC | PRN
Start: 2016-11-21 — End: 2018-01-24

## 2016-11-21 NOTE — Progress Notes (Signed)
[facility]  PATIENT: Virginia [name] DOB: 1981-03-13  REFERRING DOCTOR OR PCP:    Blair Heys SOURCE: patient, admit/discharge hospital notes, imaging reports, MRI's on PACS  _________________________________   HISTORICAL  CHIEF COMPLAINT:  Chief Complaint  Patient presents with  . Multiple Sclerosis    Sts. she continues to tolerate Aubagio well.  Feels cramping in both legs is worse, right leg worse than left.  No relief with Baclofen.  Still c/o intermittent episodes of dizziness, intermittent numbness random body parts that may last 30 min. or so and resolve/fim    HISTORY OF PRESENT ILLNESS:  Virginia [name] is a 36 yo woman with multiple sclerosis and dizziness.    MS:  She has been on Aubagio since mid 2017.    She still gets nausea with the Aubagio.       Spells:    She has spells of dizziness that come on randomly and last 5-10 miinutes.   They occur once or twice a week.     She has had only a couple more spells where she is harder to arouse.    She is sleeping well at night with temazepam.   She denies a hangover.      Gait/strength/sensation: Gait is off balance, worse when tired.  .   Balance is worse in the dark and when she closes her eyes.  .   She is not veering as much with walking.    She notes right sided arm and leg weakness and fatigue > left side.  No facial weakness.   She was having lightning sensations down her leg.    She gets cramps in her arms and feet.  Baclofen may have helped some.  Baclofen during the day was not tolerated so she just takes at bedtime. .   She also gets tingling and numbness in the limbs.    Bladder/bowel: She has noted urinary frequency and urgency but has not having incontinence. There is no constipation.  Vision: She denies blurry vision or diplopia now.    Fatigue/sleep: She has only a litttle fatigue or sleepiness.   She has some insomnia is been a more chronic problem for her. Temazepam helps her fall asleep  well.   She still wakes up buut quickly falls asleep.  Mood/cognition: She feels mood is a little better with less irritability and anxiety.    She denies any significant change in cognitive function. She notes more difficulty with memory, focus or verbal fluency.      MS History:   In mid May 2017, she was starting to experience occasional lightheadedness and a spinning vertigo. On 03/19/2016, she had more extreme vertigo and had an episode of syncope. She notices that when she was walking she would veer towards the right. Because of the syncope, she was taken to the emergency room. He had an MRI of the brain that showed several enhancing lesions, potentially worrisome for multiple sclerosis. Additional studies were performed. She received 3 days of IV steroids. The MRI's of the spine did not show any additional plaques. The lumbar puncture showed oligoclonal bands with normal IgG index, consistent with multiple sclerosis.  MRI images from 03/20/2016, 03/22/2016 and CT angiogram images from 03/23/2016 were personally reviewed. The MRI of the brain shows several 2/fair hyperintense foci, some in the periventricular and juxtacortical white matter. Most of the foci enhanced after contrast administration. MRI of the cervical and thoracic spine did not show any spinal cord plaques and there  was no significant degenerative change.   CT angiogram was essentially normal showing no significant stenosis.   She underwent a lumbar puncture on 03/21/2016. 4 oligoclonal bands present in the CSF which were not present in the serum, consistent with multiple sclerosis. The IgG index was high normal at 0.6.   There were only 4 white blood cells but 280 red blood cells more consistent with a slightly traumatic tap.    [facility] Spotted Fever CSF IgG was negative but the IgM was positive at 1.41 (less than 0.9 normal).    ANA and ANCA were both negative.    REVIEW OF SYSTEMS: Constitutional: No fevers, chills,  sweats, or change in appetite.   She has fatigue. Insomnia much better with temazepam Eyes: No visual changes, double vision, eye pain Ear, nose and throat: No hearing loss, ear pain, nasal congestion, sore throat Cardiovascular: No chest pain, palpitations Respiratory: No shortness of breath at rest or with exertion.   No wheezes GastrointestinaI: No nausea, vomiting, diarrhea, abdominal pain, fecal incontinence Genitourinary: No dysuria, urinary retention.   She reports frequency.  No nocturia. Musculoskeletal: No neck pain, back pain Integumentary: No rash, pruritus, skin lesions Neurological: as above Psychiatric: No depression at this time.  No anxiety Endocrine: No palpitations, diaphoresis, change in appetite, change in weigh or increased thirst Hematologic/Lymphatic: No anemia, purpura, petechiae. Allergic/Immunologic: No itchy/runny eyes, nasal congestion, recent allergic reactions, rashes  ALLERGIES: Allergies  Allergen Reactions  . Codeine Other (See Comments)    Hallucinations     HOME MEDICATIONS:  Current Outpatient Prescriptions:  .  baclofen (LIORESAL) 10 MG tablet, Take 1 tablet (10 mg total) by mouth 3 (three) times daily., Disp: 90 each, Rfl: 0 .  naproxen sodium (ANAPROX) 220 MG tablet, Take 220 mg by mouth 2 (two) times daily as needed (for pain)., Disp: , Rfl:  .  temazepam (RESTORIL) 30 MG capsule, Take 1 capsule (30 mg total) by mouth at bedtime as needed for sleep., Disp: 30 capsule, Rfl: 5 .  Teriflunomide 14 MG TABS, Take 14 mg by mouth daily., Disp: 28 tablet, Rfl: 12 .  buPROPion (WELLBUTRIN XL) 300 MG 24 hr tablet, Take 1 tablet (300 mg total) by mouth daily. (Patient not taking: Reported on 11/21/2016), Disp: 30 tablet, Rfl: 5 .  metoCLOPramide (REGLAN) 5 MG tablet, Take 1 tablet (5 mg total) by mouth every 8 (eight) hours as needed for nausea., Disp: 90 tablet, Rfl: 1  PAST MEDICAL HISTORY: Past Medical History:  Diagnosis Date  . Anxiety   .  Headache   . Multiple sclerosis (HCC)   . Syncope and collapse     PAST SURGICAL HISTORY: No past surgical history on file.  FAMILY HISTORY: Family History  Problem Relation Age of Onset  . Emphysema Mother   . Osteoarthritis Mother   . Other Father   . ALS Maternal Grandmother     SOCIAL HISTORY:  Social History   Social History  . Marital status: Married    Spouse name: N/A  . Number of children: N/A  . Years of education: N/A   Occupational History  . Not on file.   Social History Main Topics  . Smoking status: Current Every Day Smoker    Types: E-cigarettes  . Smokeless tobacco: Never Used  . Alcohol use 0.0 oz/week     Comment: rarely  . Drug use: No  . Sexual activity: Not on file   Other Topics Concern  . Not on file  Social History Narrative  . No narrative on file     PHYSICAL EXAM  Vitals:   11/21/16 1427  BP: 118/76  Pulse: 78  Resp: 18  Weight: 195 lb (88.5 kg)  Height: 5\' 4"  (1.626 m)    Body mass index is 33.47 kg/m.   General: The patient is well-developed and well-nourished and in no acute distress   Neurologic Exam  Mental status: The patient is alert and oriented x 3 at the time of the examination. The patient has apparent normal recent and remote memory, with an apparently normal attention span and concentration ability.   Speech is normal.  Cranial nerves: Extraocular movements are normal.  There is good facial sensation to soft touch bilaterally.Facial strength is normal.  Trapezius and sternocleidomastoid strength is normal. No dysarthria is noted.  The tongue is midline, and the patient has symmetric elevation of the soft palate. No obvious hearing deficits are noted.  Motor:  Muscle bulk is normal.   Tone is normal. Strength is  5 / 5 in all 4 extremities.   Sensory: Sensory testing is intact to pinprick, soft touch and vibration sensation in all 4 extremities.  Coordination: Cerebellar testing reveals good  finger-nose-finger and heel-to-shin bilaterally.  Gait and station: Station is normal.   Gait is normal. Tandem gait is wide. Romberg is negative.   Reflexes: Deep tendon reflexes are symmetric and normal bilaterally (brisk at knees but no spread; no clonus).        DIAGNOSTIC DATA (LABS, IMAGING, TESTING) - I reviewed patient records, labs, notes, testing and imaging myself where available.  Lab Results  Component Value Date   WBC 6.2 07/19/2016   HGB 14.5 03/19/2016   HCT 41.4 07/19/2016   MCV 92 07/19/2016   PLT 289 07/19/2016      Component Value Date/Time   NA 137 03/19/2016 1929   K 3.7 03/19/2016 1929   CL 110 03/19/2016 1929   CO2 18 (L) 03/19/2016 1929   GLUCOSE 100 (H) 03/19/2016 1929   BUN 16 03/19/2016 1929   CREATININE 0.74 03/19/2016 1929   CALCIUM 8.9 03/19/2016 1929   PROT 6.4 07/19/2016 1125   ALBUMIN 4.2 07/19/2016 1125   AST 12 07/19/2016 1125   ALT 11 07/19/2016 1125   ALKPHOS 78 07/19/2016 1125   BILITOT 0.3 07/19/2016 1125   GFRNONAA >60 03/19/2016 1929   GFRAA >60 03/19/2016 1929    Lab Results  Component Value Date   TSH 0.635 02/24/2016       ASSESSMENT AND PLAN  Multiple sclerosis (HCC) - Plan: MR BRAIN W WO CONTRAST, Hepatic Function Panel  High risk medication use - Plan: Hepatic Function Panel  Ataxic gait - Plan: MR BRAIN W WO CONTRAST  Other fatigue  Spell of altered consciousness   1.   Continue Aubagio. check MRI brain to rule out subclinical progression.  If present, consider a different DMT to treat the MS.    2.   Continue temazepam.  3.   Try to stay active and exercises as tolerated. 4.    Return in 4 months or sooner if there are new or worsening neurologic symptoms.  Jeanee Fabre A. Epimenio Foot, MD, PhD 11/21/2016, 3:28 PM Certified in Neurology, Clinical Neurophysiology, Sleep Medicine, Pain Medicine and Neuroimaging  [facility] [address] [place], [place] [address] [phone]

## 2016-11-22 LAB — HEPATIC FUNCTION PANEL
ALK PHOS: 82 IU/L (ref 39–117)
ALT: 14 IU/L (ref 0–32)
AST: 13 IU/L (ref 0–40)
Albumin: 4.3 g/dL (ref 3.5–5.5)
Bilirubin Total: 0.2 mg/dL (ref 0.0–1.2)
Bilirubin, Direct: 0.06 mg/dL (ref 0.00–0.40)
TOTAL PROTEIN: 7 g/dL (ref 6.0–8.5)

## 2016-11-28 ENCOUNTER — Ambulatory Visit
Admission: RE | Admit: 2016-11-28 | Discharge: 2016-11-28 | Disposition: A | Discharge status: Home / Self Care | Payer: Managed Care, Other (non HMO) | Source: Ambulatory Visit | Attending: Neurology | Admitting: Neurology

## 2016-11-28 DIAGNOSIS — G35 Multiple sclerosis: Secondary | ICD-10-CM | POA: Diagnosis not present

## 2016-11-28 DIAGNOSIS — R260 Ataxic gait: Secondary | ICD-10-CM

## 2016-11-28 MED ORDER — GADOBENATE DIMEGLUMINE 529 MG/ML IV SOLN
18.0000 mL | Freq: Once | INTRAVENOUS | Status: AC | PRN
  Administered 2016-11-28: 18 mL via INTRAVENOUS

## 2016-11-29 ENCOUNTER — Telehealth: Payer: Self-pay | Admitting: Neurology

## 2016-11-29 NOTE — Telephone Encounter (Signed)
I reviewed the MRI performed yesterday. She has 5-6 enhancing MS plaques.   It does not look like the Aubagio is helping her MS enough and we need to change to a stronger medication.   I would like her to have several days of IV Solu-Medrol and to come in sometime in the next week to discuss a change to a different medication.  (I can see on Friday or Monday)  I left a message that I called about her MRI results 11/29/16

## 2016-11-30 NOTE — Telephone Encounter (Signed)
I have spoken with Virginia [name] this morning and per RAS, reviewed MRI results with her.  She verbalized understanding of same.  Orders for SoluMedrol 1gm IV daily for 3 days given to [facility] in the infusion suite.  Pt. will come in at 1pm today for her first dose.  Appt. given with RAS on Monday 12-04-16 to discuss other tx. options/fim

## 2016-12-04 ENCOUNTER — Ambulatory Visit (INDEPENDENT_AMBULATORY_CARE_PROVIDER_SITE_OTHER): Payer: Managed Care, Other (non HMO) | Admitting: Neurology

## 2016-12-04 ENCOUNTER — Encounter: Payer: Self-pay | Admitting: Neurology

## 2016-12-04 VITALS — BP 123/84 | HR 86 | Resp 16 | Ht 64.0 in | Wt 200.0 lb

## 2016-12-04 DIAGNOSIS — G35 Multiple sclerosis: Secondary | ICD-10-CM | POA: Diagnosis not present

## 2016-12-04 DIAGNOSIS — Z79899 Other long term (current) drug therapy: Secondary | ICD-10-CM

## 2016-12-04 DIAGNOSIS — R208 Other disturbances of skin sensation: Secondary | ICD-10-CM

## 2016-12-04 DIAGNOSIS — R260 Ataxic gait: Secondary | ICD-10-CM

## 2016-12-04 DIAGNOSIS — R5383 Other fatigue: Secondary | ICD-10-CM

## 2016-12-04 MED ORDER — ZOLPIDEM TARTRATE 5 MG PO TABS: 5.0000 mg | ORAL_TABLET | Freq: Every evening | ORAL | 0 refills | Status: DC | PRN

## 2016-12-04 NOTE — Progress Notes (Signed)
[facility]  PATIENT: Virginia [name] DOB: 1981-10-25  REFERRING DOCTOR OR PCP:    Blair Heys SOURCE: patient, admit/discharge hospital notes, imaging reports, MRI's on PACS  _________________________________   HISTORICAL  CHIEF COMPLAINT:  Chief Complaint  Patient presents with  . Multiple Sclerosis    Here today to discuss MRI results, other tx. options for MS/fim    HISTORY OF PRESENT ILLNESS:  Virginia [name] is a 36 yo woman with multiple sclerosis and dizziness.    MS:  She has been on Aubagio since mid 2017.   After her last visit, we obtained an MRI of the brain with and without contrast. It showed that she had about 7 new gadolinium enhancing lesions activities were small in the or deep white matter. One juxtacortical focus on the left was moderate in size. Last week, I had her get IV Solu-Medrol on Thursday and Friday and we will do a third day of IV Solu-Medrol today. She has had trouble tolerating Solu-Medrol due to a metallic taste and jitteriness with insomnia.     Gait/strength/sensation: Her gait is off balance but about the same now as last time I saw her. Her balance is worse at night and when she closes her eyes.   She notes right sided arm and leg weakness and fatigue > left side.  No facial weakness.   She was having lightning sensations down her leg.   Baclofen for muscle cramps during the day was not tolerated so she just takes at bedtime. .   She also gets tingling and numbness in the limbs.    Bladder/bowel: She has noted urinary frequency and urgency but has not having incontinence. There is no constipation.  Vision: She denies blurry vision or diplopia now.    Fatigue/sleep: She has mild fatigue.   She has some insomnia is been a more chronic problem for her. Temazepam helps her fall asleep well.   She still wakes up buut quickly falls asleep.  .  Insomnia has been worse on the steroid.  Mood/cognition: She feels mood is doing ok with  little irritability and anxiety.    She denies any significant change in cognitive function. She notes more difficulty with memory, focus or verbal fluency.      MS History:   In mid May 2017, she was starting to experience occasional lightheadedness and a spinning vertigo. On 03/19/2016, she had more extreme vertigo and had an episode of syncope. She notices that when she was walking she would veer towards the right. Because of the syncope, she was taken to the emergency room. He had an MRI of the brain that showed several enhancing lesions, potentially worrisome for multiple sclerosis. Additional studies were performed. She received 3 days of IV steroids. The MRI's of the spine did not show any additional plaques. The lumbar puncture showed oligoclonal bands with normal IgG index, consistent with multiple sclerosis.  MRI images from 03/20/2016, 03/22/2016 and CT angiogram images from 03/23/2016 were personally reviewed. The MRI of the brain shows several 2/fair hyperintense foci, some in the periventricular and juxtacortical white matter. Most of the foci enhanced after contrast administration. MRI of the cervical and thoracic spine did not show any spinal cord plaques and there was no significant degenerative change.   CT angiogram was essentially normal showing no significant stenosis.   She underwent a lumbar puncture on 03/21/2016. 4 oligoclonal bands present in the CSF which were not present in the serum, consistent with multiple sclerosis. The  IgG index was high normal at 0.6.   There were only 4 white blood cells but 280 red blood cells more consistent with a slightly traumatic tap.    [facility] Spotted Fever CSF IgG was negative but the IgM was positive at 1.41 (less than 0.9 normal).    ANA and ANCA were both negative.    REVIEW OF SYSTEMS: Constitutional: No fevers, chills, sweats, or change in appetite.   She has fatigue. Insomnia much better with temazepam Eyes: No visual changes,  double vision, eye pain Ear, nose and throat: No hearing loss, ear pain, nasal congestion, sore throat Cardiovascular: No chest pain, palpitations Respiratory: No shortness of breath at rest or with exertion.   No wheezes GastrointestinaI: No nausea, vomiting, diarrhea, abdominal pain, fecal incontinence Genitourinary: No dysuria, urinary retention.   She reports frequency.  No nocturia. Musculoskeletal: No neck pain, back pain Integumentary: No rash, pruritus, skin lesions Neurological: as above Psychiatric: No depression at this time.  No anxiety Endocrine: No palpitations, diaphoresis, change in appetite, change in weigh or increased thirst Hematologic/Lymphatic: No anemia, purpura, petechiae. Allergic/Immunologic: No itchy/runny eyes, nasal congestion, recent allergic reactions, rashes  ALLERGIES: Allergies  Allergen Reactions  . Codeine Other (See Comments)    Hallucinations     HOME MEDICATIONS:  Current Outpatient Prescriptions:  .  baclofen (LIORESAL) 10 MG tablet, Take 1 tablet (10 mg total) by mouth 3 (three) times daily., Disp: 90 each, Rfl: 0 .  metoCLOPramide (REGLAN) 5 MG tablet, Take 1 tablet (5 mg total) by mouth every 8 (eight) hours as needed for nausea., Disp: 90 tablet, Rfl: 1 .  naproxen sodium (ANAPROX) 220 MG tablet, Take 220 mg by mouth 2 (two) times daily as needed (for pain)., Disp: , Rfl:  .  temazepam (RESTORIL) 30 MG capsule, Take 1 capsule (30 mg total) by mouth at bedtime as needed for sleep., Disp: 30 capsule, Rfl: 5 .  zolpidem (AMBIEN) 5 MG tablet, Take 1 tablet (5 mg total) by mouth at bedtime as needed for sleep., Disp: 15 tablet, Rfl: 0  PAST MEDICAL HISTORY: Past Medical History:  Diagnosis Date  . Anxiety   . Headache   . Multiple sclerosis (HCC)   . Syncope and collapse     PAST SURGICAL HISTORY: No past surgical history on file.  FAMILY HISTORY: Family History  Problem Relation Age of Onset  . Emphysema Mother   .  Osteoarthritis Mother   . Other Father   . ALS Maternal Grandmother     SOCIAL HISTORY:  Social History   Social History  . Marital status: Married    Spouse name: N/A  . Number of children: N/A  . Years of education: N/A   Occupational History  . Not on file.   Social History Main Topics  . Smoking status: Current Every Day Smoker    Types: E-cigarettes  . Smokeless tobacco: Never Used  . Alcohol use 0.0 oz/week     Comment: rarely  . Drug use: No  . Sexual activity: Not on file   Other Topics Concern  . Not on file   Social History Narrative  . No narrative on file     PHYSICAL EXAM  Vitals:   12/04/16 1035  BP: 123/84  Pulse: 86  Resp: 16  Weight: 200 lb (90.7 kg)  Height: 5\' 4"  (1.626 m)    Body mass index is 34.33 kg/m.   General: The patient is well-developed and well-nourished and in no acute  distress   Neurologic Exam  Mental status: The patient is alert and oriented x 3 at the time of the examination. The patient has apparent normal recent and remote memory, with an apparently normal attention span and concentration ability.   Speech is normal.  Cranial nerves: Extraocular movements are normal.  There is good facial sensation to soft touch bilaterally.Facial strength is normal.  Trapezius and sternocleidomastoid strength is normal. No dysarthria is noted.  The tongue is midline, and the patient has symmetric elevation of the soft palate. No obvious hearing deficits are noted.  Motor:  Muscle bulk is normal.   Tone is normal. Strength is  5 / 5 in all 4 extremities.   Sensory: Sensory testing is intact to Touch and vibration sensation in all 4 extremities.  Coordination: Cerebellar testing reveals good finger-nose-finger and heel-to-shin bilaterally.  Gait and station: Station is normal.   Gait is normal. Tandem gait is mildly wide. Romberg is negative.   Reflexes: Deep tendon reflexes are symmetric and normal in arms.  DTRs are brisk at knees  but no spread; no clonus).        DIAGNOSTIC DATA (LABS, IMAGING, TESTING) - I reviewed patient records, labs, notes, testing and imaging myself where available.  Lab Results  Component Value Date   WBC 6.2 07/19/2016   HGB 14.5 03/19/2016   HCT 41.4 07/19/2016   MCV 92 07/19/2016   PLT 289 07/19/2016      Component Value Date/Time   NA 137 03/19/2016 1929   K 3.7 03/19/2016 1929   CL 110 03/19/2016 1929   CO2 18 (L) 03/19/2016 1929   GLUCOSE 100 (H) 03/19/2016 1929   BUN 16 03/19/2016 1929   CREATININE 0.74 03/19/2016 1929   CALCIUM 8.9 03/19/2016 1929   PROT 7.0 11/21/2016 1533   ALBUMIN 4.3 11/21/2016 1533   AST 13 11/21/2016 1533   ALT 14 11/21/2016 1533   ALKPHOS 82 11/21/2016 1533   BILITOT 0.2 11/21/2016 1533   GFRNONAA >60 03/19/2016 1929   GFRAA >60 03/19/2016 1929    Lab Results  Component Value Date   TSH 0.635 02/24/2016       ASSESSMENT AND PLAN  Multiple sclerosis (HCC) - Plan: Hepatitis B surface antigen, Quantiferon tb gold assay (blood), Hepatitis B surface antibody, Hepatitis B core antibody, total, Hepatic function panel, CBC with Differential/Platelet, Stratify JCV Antibody Test ([facility])  High risk medication use - Plan: Hepatitis B surface antigen, Quantiferon tb gold assay (blood), Hepatitis B surface antibody, Hepatitis B core antibody, total, Hepatic function panel, CBC with Differential/Platelet, Stratify JCV Antibody Test (Quest)  Ataxic gait  Other fatigue  Dysesthesia   1.   Because of recruitment disease while on Aubagio, we need to change to a more efficacious medication. We discussed the pros and cons of ocrelizumab and Tysabri right to switch to one of those 2. We will check her JCV antibody test, hepatitis B and TB serology function tests and CBC. If she is JCV antibody negative, she would likely choose Tysabri. If positive, she will likely choose ocrelizumab.  2.   Continue temazepam.   I wrote  for a few Ambien pills to take  this week as insomnia is worse with her IV steroid 3.   Try to stay active and exercises as tolerated. 4.    Return for next infusion.  Call sooner if there are new or worsening neurologic symptoms.  Vernia Teem A. Epimenio Foot, MD, PhD 12/04/2016, 1:59 PM Certified in Neurology, Clinical Neurophysiology,  Sleep Medicine, Pain Medicine and Neuroimaging  [facility] [address], [place], [place] [address] [phone]

## 2016-12-05 LAB — HEPATIC FUNCTION PANEL
ALK PHOS: 73 IU/L (ref 39–117)
ALT: 13 IU/L (ref 0–32)
AST: 11 IU/L (ref 0–40)
Albumin: 3.7 g/dL (ref 3.5–5.5)
BILIRUBIN, DIRECT: 0.06 mg/dL (ref 0.00–0.40)
Bilirubin Total: <0.2 mg/dL (ref 0.0–1.2)
Total Protein: 6.1 g/dL (ref 6.0–8.5)

## 2016-12-05 LAB — CBC WITH DIFFERENTIAL/PLATELET
BASOS ABS: 0 10*3/uL (ref 0.0–0.2)
Basos: 0 %
EOS (ABSOLUTE): 0.4 10*3/uL (ref 0.0–0.4)
Eos: 5 %
Hematocrit: 40.6 % (ref 34.0–46.6)
Hemoglobin: 13.3 g/dL (ref 11.1–15.9)
Immature Grans (Abs): 0 10*3/uL (ref 0.0–0.1)
Immature Granulocytes: 0 %
LYMPHS ABS: 2.7 10*3/uL (ref 0.7–3.1)
Lymphs: 31 %
MCH: 30.6 pg (ref 26.6–33.0)
MCHC: 32.8 g/dL (ref 31.5–35.7)
MCV: 93 fL (ref 79–97)
MONOS ABS: 0.7 10*3/uL (ref 0.1–0.9)
Monocytes: 9 %
Neutrophils Absolute: 4.7 10*3/uL (ref 1.4–7.0)
Neutrophils: 55 %
Platelets: 302 10*3/uL (ref 150–379)
RBC: 4.35 x10E6/uL (ref 3.77–5.28)
RDW: 13.3 % (ref 12.3–15.4)
WBC: 8.6 10*3/uL (ref 3.4–10.8)

## 2016-12-05 LAB — HEPATITIS B SURFACE ANTIBODY,QUALITATIVE: Hep B Surface Ab, Qual: NONREACTIVE

## 2016-12-05 LAB — HEPATITIS B CORE ANTIBODY, TOTAL: Hep B Core Total Ab: NEGATIVE

## 2016-12-05 LAB — HEPATITIS B SURFACE ANTIGEN: Hepatitis B Surface Ag: NEGATIVE

## 2016-12-07 LAB — QUANTIFERON IN TUBE
QFT TB AG MINUS NIL VALUE: 0.01 IU/mL
QUANTIFERON MITOGEN VALUE: >10 IU/mL
QUANTIFERON TB AG VALUE: 0.03 IU/mL
QUANTIFERON TB GOLD: NEGATIVE
Quantiferon Nil Value: 0.02 IU/mL

## 2016-12-07 LAB — QUANTIFERON TB GOLD ASSAY (BLOOD)

## 2016-12-12 ENCOUNTER — Telehealth: Payer: Self-pay | Admitting: *Deleted

## 2016-12-12 NOTE — Telephone Encounter (Signed)
-----   Message from Asa Lente, MD sent at 12/11/2016  6:04 PM EST ----- Her JCV antibody is negative. We can send iin the Tysabri form

## 2016-12-12 NOTE — Telephone Encounter (Signed)
LMOM (identified vm) that per RAS, Tysabri labwork looks good.  I have turned Ty srf in to [facility] in the infusion suite today.  If she has not heard from [facility] w/i 2 weeks, please call and let me know/fim

## 2016-12-14 ENCOUNTER — Telehealth: Payer: Self-pay | Admitting: *Deleted

## 2016-12-14 ENCOUNTER — Encounter: Payer: Self-pay | Admitting: Neurology

## 2016-12-14 MED ORDER — GABAPENTIN 300 MG PO CAPS: 300.0000 mg | ORAL_CAPSULE | Freq: Three times a day (TID) | ORAL | 5 refills | Status: DC

## 2016-12-14 NOTE — Telephone Encounter (Signed)
See 12-14-16 email from pt/fim

## 2016-12-20 ENCOUNTER — Other Ambulatory Visit: Payer: Self-pay | Admitting: *Deleted

## 2016-12-20 ENCOUNTER — Encounter: Payer: Self-pay | Admitting: Neurology

## 2016-12-20 DIAGNOSIS — M79604 Pain in right leg: Secondary | ICD-10-CM

## 2016-12-20 DIAGNOSIS — G35 Multiple sclerosis: Secondary | ICD-10-CM

## 2016-12-20 DIAGNOSIS — R269 Unspecified abnormalities of gait and mobility: Secondary | ICD-10-CM

## 2016-12-20 DIAGNOSIS — M79605 Pain in left leg: Secondary | ICD-10-CM

## 2016-12-20 DIAGNOSIS — M5412 Radiculopathy, cervical region: Secondary | ICD-10-CM

## 2016-12-20 NOTE — Progress Notes (Signed)
See 12-20-16 email/fim

## 2016-12-25 ENCOUNTER — Ambulatory Visit
Admission: RE | Admit: 2016-12-25 | Discharge: 2016-12-25 | Disposition: A | Discharge status: Home / Self Care | Payer: Managed Care, Other (non HMO) | Source: Ambulatory Visit | Attending: Neurology | Admitting: Neurology

## 2016-12-25 DIAGNOSIS — M79604 Pain in right leg: Secondary | ICD-10-CM

## 2016-12-25 DIAGNOSIS — R269 Unspecified abnormalities of gait and mobility: Secondary | ICD-10-CM | POA: Diagnosis not present

## 2016-12-25 DIAGNOSIS — M5412 Radiculopathy, cervical region: Secondary | ICD-10-CM | POA: Diagnosis not present

## 2016-12-25 DIAGNOSIS — G35 Multiple sclerosis: Secondary | ICD-10-CM

## 2016-12-25 DIAGNOSIS — M79605 Pain in left leg: Secondary | ICD-10-CM

## 2016-12-25 MED ORDER — GADOBENATE DIMEGLUMINE 529 MG/ML IV SOLN
18.0000 mL | Freq: Once | INTRAVENOUS | Status: AC | PRN
  Administered 2016-12-25: 18 mL via INTRAVENOUS

## 2016-12-26 ENCOUNTER — Telehealth: Payer: Self-pay | Admitting: Neurology

## 2016-12-26 NOTE — Telephone Encounter (Signed)
I spoke to Fleet Contras over the phone about the MRI of the cervical spine. There are 2 foci, both to the left.   One of them may have been present  but smaller on the previous MRI and the other one is clearly new.  These likely explain her Lhermite's sign.    She got a message that she was approved for Tysabri but has not yet been scheduled.  Patient, please check with Inetta Fermo to see if we can get her scheduled in the next week or so.

## 2016-12-27 NOTE — Telephone Encounter (Signed)
I have spoken with Inetta Fermo.  It appears pt. has been approved.  Inetta Fermo is going to confirm this with [facility] and reach out to pt. to schedule.  If there is a problem, she will let you know/fim

## 2016-12-28 ENCOUNTER — Encounter: Payer: Self-pay | Admitting: Neurology

## 2016-12-29 ENCOUNTER — Telehealth: Payer: Self-pay | Admitting: *Deleted

## 2016-12-29 MED ORDER — GABAPENTIN 600 MG PO TABS: 600.0000 mg | ORAL_TABLET | Freq: Three times a day (TID) | ORAL | 1 refills | Status: DC

## 2016-12-29 NOTE — Telephone Encounter (Signed)
See 12-29-16 email/fim

## 2017-01-04 ENCOUNTER — Encounter: Payer: Self-pay | Admitting: Neurology

## 2017-01-11 ENCOUNTER — Encounter: Payer: Self-pay | Admitting: Neurology

## 2017-01-11 ENCOUNTER — Encounter: Payer: Self-pay | Admitting: *Deleted

## 2017-01-11 ENCOUNTER — Telehealth: Payer: Self-pay | Admitting: *Deleted

## 2017-01-11 MED ORDER — LAMOTRIGINE 25 MG PO TABS: 25.0000 mg | ORAL_TABLET | Freq: Every day | ORAL | 0 refills | Status: DC

## 2017-01-11 NOTE — Telephone Encounter (Signed)
Extended, pleasant conversation with pt. Since Gabapentin has not helped, per RAS, ok for Lamcital, 25mg , titrating up to 100mg  bid.  Titration schedule reviewed with pt. Rx. with detailed instructions faxed to [facility] per her request.  She also c/o strobe lights bilat vision, onset yesterday, more frequent today.  Not assoc. with h/a. Denies blurry or double vision.  Denies eye pain.  Per RAS, pt. should f/u with opthalmology, as this does not sound like it is related to MS.  Pt. is agreeable/fim

## 2017-01-29 ENCOUNTER — Telehealth: Payer: Self-pay | Admitting: *Deleted

## 2017-01-29 ENCOUNTER — Encounter: Payer: Self-pay | Admitting: Neurology

## 2017-01-29 MED ORDER — BACLOFEN 10 MG PO TABS: 10.0000 mg | ORAL_TABLET | Freq: Three times a day (TID) | ORAL | 0 refills | Status: DC

## 2017-01-29 MED ORDER — TEMAZEPAM 30 MG PO CAPS: 30.0000 mg | ORAL_CAPSULE | Freq: Every evening | ORAL | 5 refills | Status: DC | PRN

## 2017-01-29 NOTE — Telephone Encounter (Signed)
Baclofen escribed to [facility], Temazepam faxed to [facility] per emailed request/fim

## 2017-02-05 ENCOUNTER — Encounter: Payer: Self-pay | Admitting: Neurology

## 2017-02-06 ENCOUNTER — Telehealth: Payer: Self-pay | Admitting: *Deleted

## 2017-02-06 DIAGNOSIS — G35 Multiple sclerosis: Secondary | ICD-10-CM

## 2017-02-06 DIAGNOSIS — H543 Unqualified visual loss, both eyes: Secondary | ICD-10-CM

## 2017-02-06 NOTE — Telephone Encounter (Signed)
Referral to neuro-opthalmologist placed in EPIC/fim

## 2017-02-06 NOTE — Telephone Encounter (Signed)
Pt. with continued c/o decreased vision bilat, left eye worse than right.  Has seen her regular opthalmologist, sts. Dr. Sherryll Burger here in [place] declined to eval her, just said visual disturbances are related to MS.  Per RAS, ok for referral to neuro-opthalmology at [facility].  Pt. agreeable.  Referral placed in EPIC/fim

## 2017-02-08 DIAGNOSIS — Z0289 Encounter for other administrative examinations: Secondary | ICD-10-CM

## 2017-02-14 ENCOUNTER — Encounter: Payer: Self-pay | Admitting: Neurology

## 2017-02-15 ENCOUNTER — Telehealth: Payer: Self-pay | Admitting: *Deleted

## 2017-02-15 ENCOUNTER — Encounter: Payer: Self-pay | Admitting: Neurology

## 2017-02-15 ENCOUNTER — Ambulatory Visit (INDEPENDENT_AMBULATORY_CARE_PROVIDER_SITE_OTHER): Payer: Managed Care, Other (non HMO) | Admitting: Neurology

## 2017-02-15 DIAGNOSIS — R5383 Other fatigue: Secondary | ICD-10-CM | POA: Diagnosis not present

## 2017-02-15 DIAGNOSIS — H5500 Unspecified nystagmus: Secondary | ICD-10-CM | POA: Insufficient documentation

## 2017-02-15 DIAGNOSIS — R260 Ataxic gait: Secondary | ICD-10-CM

## 2017-02-15 DIAGNOSIS — G35 Multiple sclerosis: Secondary | ICD-10-CM | POA: Diagnosis not present

## 2017-02-15 HISTORY — DX: Unspecified nystagmus: H55.00

## 2017-02-15 NOTE — Telephone Encounter (Signed)
Have lmom both home and cell #'s/fim

## 2017-02-15 NOTE — Telephone Encounter (Signed)
Pt. received SoluMedrol 1gm IV in our office today.  She is scheduled to receive SM 1gm IV at [facility] at 1145 on 02-16-17, and at 10am on 02-19-17.  Orders have been faxed to [phone]/fim

## 2017-02-15 NOTE — Telephone Encounter (Signed)
LMTC.  Per RAS, will offer SM 1gm IV in our office today./fim

## 2017-02-15 NOTE — Progress Notes (Signed)
[facility]  PATIENT: Virginia [name] DOB: 02/23/1981  REFERRING DOCTOR OR PCP:    Blair Heys SOURCE: patient, admit/discharge hospital notes, imaging reports, MRI's on PACS  _________________________________   HISTORICAL  CHIEF COMPLAINT:  Chief Complaint  Patient presents with  . Multiple Sclerosis    HISTORY OF PRESENT ILLNESS:  Virginia [name] is a 36 yo woman with multiple sclerosis and dizziness.    She has reduced vision out of the left eye and notes jumping of the eye.  The eye jumping occurs when she looks to the left or right.     Symptoms started after her first Tysabri infusion in March.  Compared to 5 weeks ago, eye jumping is the same but VA seems worse to her.   She has not noted much change in color vision.    She also had the onset of more leg numbness both sides and yesterday she lost more feeling in her legs.    She gets a [place] sign when she flexes her neck.      MS:  She switched to Tysabri from Aubagio due to breakthrough disease -- MRI 11/28/16 showed that she had about 7 new gadolinium enhancing lesions.  Most were small in the or deep white matter but one juxtacortical focus on the left was moderate in size.   MRI of the cervical spine February 2017 showed 2 plaques in the spinal cord, one not present in May 2017.      She started Tysabri in March 2018.                                                                                                                                                                                                                                                              Gait/strength/sensation: She feels her gait is off balanced and mildly wide.   She notes right sided arm and leg weakness and fatigue > left side.  No facial weakness.   She was having lightning sensations down her leg.   Baclofen for muscle cramps during the day was not tolerated so she just takes at bedtime. .   She also gets tingling and  numbness in the limbs.    Bladder/bowel: She has noted urinary frequency and urgency but has not having  incontinence. There is no constipation.  Vision: She denies blurry vision or diplopia now.    Fatigue/sleep: She reports fatigue is both physical and cognitive. She also notes some insomnia. She states worse when she has steroids. She is on temazepam with benefit.  Mood/cognition: She feels mood is doing ok  .    She denies any significant change in cognitive function. She notes more difficulty with memory, focus or verbal fluency.      MS History:   In mid May 2017, she was starting to experience occasional lightheadedness and a spinning vertigo. On 03/19/2016, she had more extreme vertigo and had an episode of syncope. She notices that when she was walking she would veer towards the right. Because of the syncope, she was taken to the emergency room. He had an MRI of the brain that showed several enhancing lesions, potentially worrisome for multiple sclerosis. Additional studies were performed. She received 3 days of IV steroids. The MRI's of the spine did not show any additional plaques. The lumbar puncture showed oligoclonal bands with normal IgG index, consistent with multiple sclerosis.  MRI images from 03/20/2016, 03/22/2016 and CT angiogram images from 03/23/2016 were personally reviewed. The MRI of the brain shows several 2/fair hyperintense foci, some in the periventricular and juxtacortical white matter. Most of the foci enhanced after contrast administration. MRI of the cervical and thoracic spine did not show any spinal cord plaques and there was no significant degenerative change.   CT angiogram was essentially normal showing no significant stenosis.   She underwent a lumbar puncture on 03/21/2016. 4 oligoclonal bands present in the CSF which were not present in the serum, consistent with multiple sclerosis. The IgG index was high normal at 0.6.   There were only 4 white blood cells but  280 red blood cells more consistent with a slightly traumatic tap.    The [facility] Spotted Fever CSF IgG was negative but the IgM was positive at 1.41 (less than 0.9 normal).    ANA and ANCA were both negative.    REVIEW OF SYSTEMS: Constitutional: No fevers, chills, sweats, or change in appetite.   She has fatigue. Insomnia much better with temazepam Eyes: No visual changes, double vision, eye pain Ear, nose and throat: No hearing loss, ear pain, nasal congestion, sore throat Cardiovascular: No chest pain, palpitations Respiratory: No shortness of breath at rest or with exertion.   No wheezes GastrointestinaI: No nausea, vomiting, diarrhea, abdominal pain, fecal incontinence Genitourinary: No dysuria, urinary retention.   She reports frequency.  No nocturia. Musculoskeletal: No neck pain, back pain Integumentary: No rash, pruritus, skin lesions Neurological: as above Psychiatric: No depression at this time.  No anxiety Endocrine: No palpitations, diaphoresis, change in appetite, change in weigh or increased thirst Hematologic/Lymphatic: No anemia, purpura, petechiae. Allergic/Immunologic: No itchy/runny eyes, nasal congestion, recent allergic reactions, rashes  ALLERGIES: Allergies  Allergen Reactions  . Codeine Other (See Comments)    Hallucinations     HOME MEDICATIONS:  Current Outpatient Prescriptions:  .  baclofen (LIORESAL) 10 MG tablet, Take 1 tablet (10 mg total) by mouth 3 (three) times daily., Disp: 90 each, Rfl: 0 .  metoCLOPramide (REGLAN) 5 MG tablet, Take 1 tablet (5 mg total) by mouth every 8 (eight) hours as needed for nausea., Disp: 90 tablet, Rfl: 1 .  naproxen sodium (ANAPROX) 220 MG tablet, Take 220 mg by mouth 2 (two) times daily as needed (for pain)., Disp: , Rfl:  .  natalizumab (TYSABRI) 300 MG/15ML  injection, Inject 300 mg into the vein every 28 (twenty-eight) days., Disp: , Rfl:  .  temazepam (RESTORIL) 30 MG capsule, Take 1 capsule (30 mg  total) by mouth at bedtime as needed for sleep., Disp: 30 capsule, Rfl: 5 .  zolpidem (AMBIEN) 5 MG tablet, Take 1 tablet (5 mg total) by mouth at bedtime as needed for sleep., Disp: 15 tablet, Rfl: 0 .  gabapentin (NEURONTIN) 600 MG tablet, Take 1 tablet (600 mg total) by mouth 3 (three) times daily. (Patient not taking: Reported on 02/15/2017), Disp: 270 tablet, Rfl: 1 .  lamoTRIgine (LAMICTAL) 25 MG tablet, Take 1 tablet (25 mg total) by mouth daily. Week 1: Take one tablet daily.  Week 2: Take one tablet twice daily.  Week 3: Take two tablets twice daily.  Week 4: Take three tablets twice daily. Week 5: Call the office for a new prescription. (Patient not taking: Reported on 02/15/2017), Disp: 91 tablet, Rfl: 0  PAST MEDICAL HISTORY: Past Medical History:  Diagnosis Date  . Anxiety   . Headache   . Multiple sclerosis (HCC)   . Nystagmus 02/15/2017  . Syncope and collapse     PAST SURGICAL HISTORY: No past surgical history on file.  FAMILY HISTORY: Family History  Problem Relation Age of Onset  . Emphysema Mother   . Osteoarthritis Mother   . Other Father   . ALS Maternal Grandmother     SOCIAL HISTORY:  Social History   Social History  . Marital status: Married    Spouse name: N/A  . Number of children: N/A  . Years of education: N/A   Occupational History  . Not on file.   Social History Main Topics  . Smoking status: Current Every Day Smoker    Types: E-cigarettes  . Smokeless tobacco: Never Used  . Alcohol use 0.0 oz/week     Comment: rarely  . Drug use: No  . Sexual activity: Not on file   Other Topics Concern  . Not on file   Social History Narrative  . No narrative on file     PHYSICAL EXAM  There were no vitals filed for this visit.  There is no height or weight on file to calculate BMI.   General: The patient is well-developed and well-nourished and in no acute distress   Neurologic Exam  Mental status: The patient is alert and oriented x 3  at the time of the examination. The patient has apparent normal recent and remote memory, with an apparently normal attention span and concentration ability.   Speech is normal.  Cranial nerves: Extraocular movements show nystagmus with lateral gaze to either side.   Pupils are equally reactive. Color Vision is symmetric.Marland Kitchen  There is good facial sensation to soft touch bilaterally.Facial strength is normal.  Trapezius and sternocleidomastoid strength is normal. No dysarthria is noted.  The tongue is midline, and the patient has symmetric elevation of the soft palate. No obvious hearing deficits are noted.  Motor:  Muscle bulk is normal.   Tone is normal. Strength is  5 / 5 in all 4 extremities.   Sensory: Sensory testing is intact to Touch and vibration sensation in all 4 extremities.  Coordination: Cerebellar testing reveals good finger-nose-finger and heel-to-shin bilaterally.  Gait and station: Station is normal.   Gait is normal. Tandem gait is mildly wide. Romberg is negative.   Reflexes: Deep tendon reflexes are symmetric and normal in arms.  DTRs are brisk at knees but no spread and  no ankle clonus       DIAGNOSTIC DATA (LABS, IMAGING, TESTING) - I reviewed patient records, labs, notes, testing and imaging myself where available.  Lab Results  Component Value Date   WBC 8.6 12/04/2016   HGB 14.5 03/19/2016   HCT 40.6 12/04/2016   MCV 93 12/04/2016   PLT 302 12/04/2016      Component Value Date/Time   NA 137 03/19/2016 1929   K 3.7 03/19/2016 1929   CL 110 03/19/2016 1929   CO2 18 (L) 03/19/2016 1929   GLUCOSE 100 (H) 03/19/2016 1929   BUN 16 03/19/2016 1929   CREATININE 0.74 03/19/2016 1929   CALCIUM 8.9 03/19/2016 1929   PROT 6.1 12/04/2016 1131   ALBUMIN 3.7 12/04/2016 1131   AST 11 12/04/2016 1131   ALT 13 12/04/2016 1131   ALKPHOS 73 12/04/2016 1131   BILITOT <0.2 12/04/2016 1131   GFRNONAA >60 03/19/2016 1929   GFRAA >60 03/19/2016 1929    Lab Results    Component Value Date   TSH 0.635 02/24/2016       ASSESSMENT AND PLAN  Multiple sclerosis (HCC)  Ataxic gait  Nystagmus  Other fatigue   1.    Continue Tysabri. She appears to have had an exacerbation with and numbness about 5 weeks ago and has had some additional numbness in this week. I will have her get 3 days of IV Solu-Medrol.  2.  Continue symptomatic medications.  3.   Try to stay active and exercises as tolerated. 4.    Return for next infusion.  Call sooner if there are new or worsening neurologic symptoms.  Emilliano Dilworth A. Epimenio Foot, MD, PhD 02/15/2017, 5:00 PM Certified in Neurology, Clinical Neurophysiology, Sleep Medicine, Pain Medicine and Neuroimaging  [facility] [address], [address] [place], [place] [address] [phone]

## 2017-02-16 ENCOUNTER — Encounter (HOSPITAL_COMMUNITY): Payer: Self-pay

## 2017-02-16 ENCOUNTER — Encounter (HOSPITAL_COMMUNITY)
Admission: RE | Admit: 2017-02-16 | Discharge: 2017-02-16 | Disposition: A | Discharge status: Home / Self Care | Payer: Managed Care, Other (non HMO) | Source: Ambulatory Visit | Attending: Neurology | Admitting: Neurology

## 2017-02-16 DIAGNOSIS — G35 Multiple sclerosis: Secondary | ICD-10-CM | POA: Diagnosis present

## 2017-02-16 MED ORDER — SODIUM CHLORIDE 0.9 % IV SOLN
INTRAVENOUS | Status: DC
  Administered 2017-02-16: 250 mL via INTRAVENOUS

## 2017-02-16 MED ORDER — SODIUM CHLORIDE 0.9 % IV SOLN
1000.0000 mg | Freq: Once | INTRAVENOUS | Status: AC
  Administered 2017-02-16: 1000 mg via INTRAVENOUS
  Filled 2017-02-16: qty 8

## 2017-02-19 ENCOUNTER — Encounter (HOSPITAL_COMMUNITY)
Admission: RE | Admit: 2017-02-19 | Discharge: 2017-02-19 | Disposition: A | Discharge status: Home / Self Care | Payer: Managed Care, Other (non HMO) | Source: Ambulatory Visit | Attending: Neurology | Admitting: Neurology

## 2017-02-19 DIAGNOSIS — G35 Multiple sclerosis: Secondary | ICD-10-CM | POA: Diagnosis not present

## 2017-02-19 MED ORDER — SODIUM CHLORIDE 0.9 % IV SOLN
INTRAVENOUS | Status: DC
  Administered 2017-02-19: 1000 mL via INTRAVENOUS

## 2017-02-19 MED ORDER — SODIUM CHLORIDE 0.9 % IV SOLN
1000.0000 mg | Freq: Once | INTRAVENOUS | Status: AC
  Administered 2017-02-19: 1000 mg via INTRAVENOUS
  Filled 2017-02-19: qty 8

## 2017-02-25 ENCOUNTER — Other Ambulatory Visit: Payer: Self-pay | Admitting: Neurology

## 2017-03-30 ENCOUNTER — Other Ambulatory Visit: Payer: Self-pay | Admitting: Neurology

## 2017-04-24 ENCOUNTER — Ambulatory Visit: Payer: Managed Care, Other (non HMO) | Admitting: Neurology

## 2017-04-25 ENCOUNTER — Encounter: Payer: Self-pay | Admitting: Neurology

## 2017-04-25 ENCOUNTER — Ambulatory Visit (INDEPENDENT_AMBULATORY_CARE_PROVIDER_SITE_OTHER): Payer: Managed Care, Other (non HMO) | Admitting: Neurology

## 2017-04-25 VITALS — BP 126/69 | HR 79 | Resp 16 | Ht 64.0 in | Wt 199.0 lb

## 2017-04-25 DIAGNOSIS — R7989 Other specified abnormal findings of blood chemistry: Secondary | ICD-10-CM

## 2017-04-25 DIAGNOSIS — E559 Vitamin D deficiency, unspecified: Secondary | ICD-10-CM

## 2017-04-25 DIAGNOSIS — R5383 Other fatigue: Secondary | ICD-10-CM

## 2017-04-25 DIAGNOSIS — Z79899 Other long term (current) drug therapy: Secondary | ICD-10-CM

## 2017-04-25 DIAGNOSIS — G35 Multiple sclerosis: Secondary | ICD-10-CM | POA: Diagnosis not present

## 2017-04-25 DIAGNOSIS — R260 Ataxic gait: Secondary | ICD-10-CM

## 2017-04-25 DIAGNOSIS — G4700 Insomnia, unspecified: Secondary | ICD-10-CM | POA: Diagnosis not present

## 2017-04-25 MED ORDER — ARMODAFINIL 250 MG PO TABS
ORAL_TABLET | ORAL | 5 refills | Status: DC
Start: 2017-04-25 — End: 2017-09-03

## 2017-04-25 NOTE — Progress Notes (Signed)
[facility]  PATIENT: Virginia [name] DOB: 05-28-1981  REFERRING DOCTOR OR PCP:    Blair Heys SOURCE: patient, admit/discharge hospital notes, imaging reports, MRI's on PACS  _________________________________   HISTORICAL  CHIEF COMPLAINT:  Chief Complaint  Patient presents with  . Multiple Sclerosis    Sts. she continues to tolerate Tysabri well.  JCV ab last checked 12/04/16 and was negative at 0.15.  She had 3 days of IV SM in April 2018, for exacerbation.  Sts. she continues to have numbness/tingling entire legs, and legs feel heavy with extended periods of walking. Still c/o twitching bilat eyes "when I'm trying to focus in on something."/fim    HISTORY OF PRESENT ILLNESS:  Virginia [name] is a 36 yo woman with multiple sclerosis     I last saw her in March and she was having a new MS exacerbation with leg numbness and nystagmus. Those symptoms resolved after she got the IV steroids. She denies any significantly new symptoms.   MS:  She has been on Tysabri x 4 months and tolerates it well.   She did have an exacerbation after er first dose treated with 3 days of IV Solu-Medrol.  She was on Aubagio but had breakthrough disease -- MRI 11/28/16 showed that she had about 7 new gadolinium enhancing lesions.  Most were small in the or deep white matter but one juxtacortical focus on the left was moderate in size.   MRI of the cervical spine February 2017 showed 2 plaques in the spinal cord, one not present in May 2017.                                                                                                      Gait/strength/sensation: Gait is similar with wide stance and with decrease balance.   She notes more trouble when she is walking longer,     This causes an increase in the tingling in her legs as well.   She feels her gait is off balanced and mildly wide.   She notes right sided arm and leg weakness and fatigue > left side.  No facial weakness.  She reports  leg numbness and tingling bilaterally. There is no facial weakness or numbness.   Bladder/bowel: She has noted urinary frequency and urgency but has not having incontinence. There is no constipation.  Vision/vertigo: She denies blurry vision or diplopia now.  She had a 10 minute spell of vertigo that occurred while eating in the past month.   There was no positional trigger.  She has had shorter sensations f fertigo but never one that intense.  Fatigue/sleep: She has both physical and cognitive fatigue.  She feels her insomnia is doing better on temazepam.   She sleeps through the night for the most part.   Mood/cognition: She denies depression and anxiety.      She denies any significant change in cognitive function. She notes some difficulty with memory, focus and verbal fluency. MS History:   In mid May 2017, she was starting to experience occasional lightheadedness and a spinning vertigo.  On 03/19/2016, she had more extreme vertigo and had an episode of syncope. She notices that when she was walking she would veer towards the right. Because of the syncope, she was taken to the emergency room. He had an MRI of the brain that showed several enhancing lesions, potentially worrisome for multiple sclerosis. Additional studies were performed. She received 3 days of IV steroids. The MRI's of the spine did not show any additional plaques. The lumbar puncture showed oligoclonal bands with normal IgG index, consistent with multiple sclerosis.  MRI images from 03/20/2016, 03/22/2016 and CT angiogram images from 03/23/2016 were personally reviewed. The MRI of the brain shows several 2/fair hyperintense foci, some in the periventricular and juxtacortical white matter. Most of the foci enhanced after contrast administration. MRI of the cervical and thoracic spine did not show any spinal cord plaques and there was no significant degenerative change.   CT angiogram was essentially normal showing no significant  stenosis.   She underwent a lumbar puncture on 03/21/2016. 4 oligoclonal bands present in the CSF which were not present in the serum, consistent with multiple sclerosis. The IgG index was high normal at 0.6.   There were only 4 white blood cells but 280 red blood cells more consistent with a slightly traumatic tap.    The [facility] Spotted Fever CSF IgG was negative but the IgM was positive at 1.41 (less than 0.9 normal).    ANA and ANCA were both negative.    REVIEW OF SYSTEMS: Constitutional: No fevers, chills, sweats, or change in appetite.   She has fatigue. Insomnia much better with temazepam Eyes: No visual changes, double vision, eye pain Ear, nose and throat: No hearing loss, ear pain, nasal congestion, sore throat Cardiovascular: No chest pain, palpitations Respiratory: No shortness of breath at rest or with exertion.   No wheezes GastrointestinaI: No nausea, vomiting, diarrhea, abdominal pain, fecal incontinence Genitourinary: No dysuria, urinary retention.   She reports frequency.  No nocturia. Musculoskeletal: No neck pain, back pain Integumentary: No rash, pruritus, skin lesions Neurological: as above Psychiatric: No depression at this time.  No anxiety Endocrine: No palpitations, diaphoresis, change in appetite, change in weigh or increased thirst Hematologic/Lymphatic: No anemia, purpura, petechiae. Allergic/Immunologic: No itchy/runny eyes, nasal congestion, recent allergic reactions, rashes  ALLERGIES: Allergies  Allergen Reactions  . Codeine Other (See Comments)    Hallucinations     HOME MEDICATIONS:  Current Outpatient Prescriptions:  .  baclofen (LIORESAL) 10 MG tablet, TAKE 1 TABLET (10 MG TOTAL) BY MOUTH 3 (THREE) TIMES DAILY., Disp: 90 tablet, Rfl: 0 .  gabapentin (NEURONTIN) 600 MG tablet, Take 1 tablet (600 mg total) by mouth 3 (three) times daily., Disp: 270 tablet, Rfl: 1 .  metoCLOPramide (REGLAN) 5 MG tablet, Take 1 tablet (5 mg total) by  mouth every 8 (eight) hours as needed for nausea., Disp: 90 tablet, Rfl: 1 .  naproxen sodium (ANAPROX) 220 MG tablet, Take 220 mg by mouth 2 (two) times daily as needed (for pain)., Disp: , Rfl:  .  natalizumab (TYSABRI) 300 MG/15ML injection, Inject 300 mg into the vein every 28 (twenty-eight) days., Disp: , Rfl:  .  temazepam (RESTORIL) 30 MG capsule, Take 1 capsule (30 mg total) by mouth at bedtime as needed for sleep., Disp: 30 capsule, Rfl: 5 .  Armodafinil 250 MG tablet, Take 1/2 to 1 pill po daily, Disp: 30 tablet, Rfl: 5  PAST MEDICAL HISTORY: Past Medical History:  Diagnosis Date  . Anxiety   .  Headache   . Multiple sclerosis (HCC)   . Nystagmus 02/15/2017  . Syncope and collapse     PAST SURGICAL HISTORY: No past surgical history on file.  FAMILY HISTORY: Family History  Problem Relation Age of Onset  . Emphysema Mother   . Osteoarthritis Mother   . Other Father   . ALS Maternal Grandmother     SOCIAL HISTORY:  Social History   Social History  . Marital status: Married    Spouse name: N/A  . Number of children: N/A  . Years of education: N/A   Occupational History  . Not on file.   Social History Main Topics  . Smoking status: Current Every Day Smoker    Types: E-cigarettes  . Smokeless tobacco: Never Used  . Alcohol use 0.0 oz/week     Comment: rarely  . Drug use: No  . Sexual activity: Not on file   Other Topics Concern  . Not on file   Social History Narrative  . No narrative on file     PHYSICAL EXAM  Vitals:   04/25/17 1409  BP: 126/69  Pulse: 79  Resp: 16  Weight: 199 lb (90.3 kg)  Height: 5\' 4"  (1.626 m)    Body mass index is 34.16 kg/m.   General: The patient is well-developed and well-nourished and in no acute distress   Neurologic Exam  Mental status: The patient is alert and oriented x 3 at the time of the examination. The patient has apparent normal recent and remote memory, with an apparently normal attention span and  concentration ability.   Speech is normal.  Cranial nerves: Extraocular muscles are intact. Pupils react to light and accommodation. Facial strength and sensation is normal..  Trapezius and sternocleidomastoid strength is normal. No dysarthria is noted.  The tongue is midline, and the patient has symmetric elevation of the soft palate. No obvious hearing deficits are noted.  Motor:  Muscle bulk is normal.   Tone is normal. Strength is  5 / 5 in all 4 extremities.   Sensory: She has intact sensation to touch and vibration in the arms or legs.   Coordination: Cerebellar testing reveals good finger-nose-finger and heel-to-shin bilaterally.  Gait and station: Station is normal.   Gait is normal. Tandem gait is mildly wide. Romberg is negative.   Reflexes: Deep tendon reflexes are symmetric and normal in arms.  DTRs are brisk at knees but no spread and no ankle clonus       DIAGNOSTIC DATA (LABS, IMAGING, TESTING) - I reviewed patient records, labs, notes, testing and imaging myself where available.  Lab Results  Component Value Date   WBC 8.6 12/04/2016   HGB 13.3 12/04/2016   HCT 40.6 12/04/2016   MCV 93 12/04/2016   PLT 302 12/04/2016      Component Value Date/Time   NA 137 03/19/2016 1929   K 3.7 03/19/2016 1929   CL 110 03/19/2016 1929   CO2 18 (L) 03/19/2016 1929   GLUCOSE 100 (H) 03/19/2016 1929   BUN 16 03/19/2016 1929   CREATININE 0.74 03/19/2016 1929   CALCIUM 8.9 03/19/2016 1929   PROT 6.1 12/04/2016 1131   ALBUMIN 3.7 12/04/2016 1131   AST 11 12/04/2016 1131   ALT 13 12/04/2016 1131   ALKPHOS 73 12/04/2016 1131   BILITOT <0.2 12/04/2016 1131   GFRNONAA >60 03/19/2016 1929   GFRAA >60 03/19/2016 1929    Lab Results  Component Value Date   TSH 0.635 02/24/2016  ASSESSMENT AND PLAN  Multiple sclerosis (HCC) - Plan: VITAMIN D 25 Hydroxy (Vit-D Deficiency, Fractures), Stratify JCV Antibody Test ([facility]), CBC with Differential/Platelet  Ataxic  gait  High risk medication use  Insomnia, unspecified type  Other fatigue - Plan: TSH, T4, VITAMIN D 25 Hydroxy (Vit-D Deficiency, Fractures)  Low vitamin D level - Plan: VITAMIN D 25 Hydroxy (Vit-D Deficiency, Fractures)   1.    Continue Tysabri 300 mg every 4 weeks. Recheck JCV Ab and CBC.  Also because of fatigue we will check TSH, T4 vitamin D. 2.    Continue symptomatic medications.  3.    Try to stay active and exercises as tolerated. 4.    Return for next infusion.  Call sooner if there are new or worsening neurologic symptoms.  Kemar Pandit A. Epimenio Foot, MD, PhD 04/25/2017, 5:02 PM Certified in Neurology, Clinical Neurophysiology, Sleep Medicine, Pain Medicine and Neuroimaging  [facility] [address], [address] [place], [place] [address] [phone] Mechele Collin

## 2017-04-26 ENCOUNTER — Telehealth: Payer: Self-pay | Admitting: *Deleted

## 2017-04-26 LAB — CBC WITH DIFFERENTIAL/PLATELET
BASOS: 1 %
Basophils Absolute: 0 10*3/uL (ref 0.0–0.2)
EOS (ABSOLUTE): 0.4 10*3/uL (ref 0.0–0.4)
Eos: 6 %
Hematocrit: 40.7 % (ref 34.0–46.6)
Hemoglobin: 13.7 g/dL (ref 11.1–15.9)
IMMATURE GRANS (ABS): 0 10*3/uL (ref 0.0–0.1)
Immature Granulocytes: 0 %
LYMPHS ABS: 2.8 10*3/uL (ref 0.7–3.1)
Lymphs: 44 %
MCH: 31.1 pg (ref 26.6–33.0)
MCHC: 33.7 g/dL (ref 31.5–35.7)
MCV: 93 fL (ref 79–97)
MONOS ABS: 0.5 10*3/uL (ref 0.1–0.9)
Monocytes: 7 %
NEUTROS ABS: 2.8 10*3/uL (ref 1.4–7.0)
Neutrophils: 42 %
PLATELETS: 289 10*3/uL (ref 150–379)
RBC: 4.4 x10E6/uL (ref 3.77–5.28)
RDW: 14.4 % (ref 12.3–15.4)
WBC: 6.6 10*3/uL (ref 3.4–10.8)

## 2017-04-26 LAB — VITAMIN D 25 HYDROXY (VIT D DEFICIENCY, FRACTURES): Vit D, 25-Hydroxy: 12.6 ng/mL — ABNORMAL LOW (ref 30.0–100.0)

## 2017-04-26 LAB — TSH: TSH: 1.4 u[IU]/mL (ref 0.450–4.500)

## 2017-04-26 LAB — T4: T4 TOTAL: 7.5 ug/dL (ref 4.5–12.0)

## 2017-04-26 NOTE — Telephone Encounter (Signed)
PA for Armodafinil 250mg  tablets 30/30 submitted via Cover My Meds.  No tried and faileds.  Dx: Fatigue related to MS (R53.83).  Key: [id number]/[id number]

## 2017-04-30 ENCOUNTER — Telehealth: Payer: Self-pay | Admitting: *Deleted

## 2017-04-30 MED ORDER — VITAMIN D (ERGOCALCIFEROL) 1.25 MG (50000 UNIT) PO CAPS: 50000.0000 [IU] | ORAL_CAPSULE | ORAL | 3 refills | Status: DC

## 2017-04-30 NOTE — Addendum Note (Signed)
Addended by: Candis Schatz I on: 04/30/2017 03:21 PM   Modules accepted: Orders

## 2017-04-30 NOTE — Telephone Encounter (Signed)
I have spoken with Fleet Contras, and per RAS, explained that Vit. D level is very low, at 12.6 (normal 30-100).  He rec. rx. Vit. D 50,000u once weekly for at least 6 mos., then will recheck level at next ov.  She verbalized understanding of same. Rx. escribed to [facility] per her request/fim

## 2017-04-30 NOTE — Telephone Encounter (Signed)
Pt returned call , please call back   °

## 2017-04-30 NOTE — Telephone Encounter (Signed)
completed.  see today's phone note/fim

## 2017-04-30 NOTE — Telephone Encounter (Signed)
-----   Message from Asa Lente, MD sent at 04/28/2017 10:19 AM EDT ----- Please let her know the vitamin D is very low. 50,000 units weekly #12 #3 and we will recheck at next visit in 5-6 months

## 2017-04-30 NOTE — Telephone Encounter (Signed)
LMTC./fim 

## 2017-05-07 ENCOUNTER — Encounter: Payer: Self-pay | Admitting: *Deleted

## 2017-05-15 ENCOUNTER — Encounter: Payer: Self-pay | Admitting: Neurology

## 2017-05-15 NOTE — Telephone Encounter (Signed)
I have spoken with Virginia [name] this afternoon.  She c/o frequent episodes of intermittent sharp stabiing RUQ pain.  Denies other sx, including sx. of infection.  I have advised f/u with her pcp or ER if she feels pain is not bearable, or condition worsens/changes.  She verbalized understanding of same/fim

## 2017-05-18 ENCOUNTER — Encounter: Payer: Self-pay | Admitting: Neurology

## 2017-07-05 ENCOUNTER — Telehealth: Payer: Self-pay | Admitting: Neurology

## 2017-07-05 NOTE — Telephone Encounter (Signed)
I have spoken with Virginia [name] and explained that all signatures on Touch forms are RAS actual signatures--none are electronic.  Virginia [name] sts. form must be resigned, will fax it to me./fim

## 2017-07-05 NOTE — Telephone Encounter (Signed)
Debbie/[employer] [phone] called she rec'd reauth questionnaire but it was not signed by provider,can not be electronically signed. Please refax with signature to [phone]

## 2017-07-10 ENCOUNTER — Encounter: Payer: Self-pay | Admitting: Neurology

## 2017-07-12 ENCOUNTER — Ambulatory Visit (INDEPENDENT_AMBULATORY_CARE_PROVIDER_SITE_OTHER): Payer: Managed Care, Other (non HMO) | Admitting: Neurology

## 2017-07-12 ENCOUNTER — Encounter: Payer: Self-pay | Admitting: Neurology

## 2017-07-12 VITALS — BP 127/88 | HR 91 | Resp 16 | Ht 64.0 in | Wt 190.5 lb

## 2017-07-12 DIAGNOSIS — M5431 Sciatica, right side: Secondary | ICD-10-CM | POA: Diagnosis not present

## 2017-07-12 DIAGNOSIS — R5383 Other fatigue: Secondary | ICD-10-CM | POA: Diagnosis not present

## 2017-07-12 DIAGNOSIS — G35 Multiple sclerosis: Secondary | ICD-10-CM

## 2017-07-12 DIAGNOSIS — R208 Other disturbances of skin sensation: Secondary | ICD-10-CM

## 2017-07-12 DIAGNOSIS — M5432 Sciatica, left side: Secondary | ICD-10-CM

## 2017-07-12 DIAGNOSIS — R260 Ataxic gait: Secondary | ICD-10-CM

## 2017-07-12 MED ORDER — ETODOLAC 400 MG PO TABS: 400.0000 mg | ORAL_TABLET | Freq: Two times a day (BID) | ORAL | 5 refills | Status: DC

## 2017-07-12 MED ORDER — TEMAZEPAM 30 MG PO CAPS: 30.0000 mg | ORAL_CAPSULE | Freq: Every evening | ORAL | 5 refills | Status: DC | PRN

## 2017-07-12 NOTE — Progress Notes (Signed)
[facility]  PATIENT: Virginia [name] DOB: 03/24/1981  REFERRING DOCTOR OR PCP:    Blair Heys SOURCE: patient, admit/discharge hospital notes, imaging reports, MRI's on PACS  _________________________________   HISTORICAL  CHIEF COMPLAINT:  Chief Complaint  Patient presents with  . Multiple Sclerosis    Sts. she continues to tolerate Tysabri well.  JCV ab last checked 04/25/17 and was negative at 0.13.  Today she c/o increased lbp radiating into both hips and down sides and fronts of both legs to just past knees.  No known injury.  No relief with otc meds or Baclofen, rest, ice or heat./fim    HISTORY OF PRESENT ILLNESS:  Virginia [name] is a 36 yo woman with multiple sclerosis Now reporting issues with low back pain radiating into her legs.  LBP/Leg pain/nek:  She has pian all over right now but pan started in her lower back and radiates into her legs.   Pain is independent of position and OTC med's did not help.   Baclofen has not helped.    She has milder pain in her neck.   Pain is disturbing her sleep.     MRI 12/25/2016 showed that she has mild spinal stenosis at C4-C5, C5-C6 and C6-C7 due to disc protrusions and mild uncovertebral spurring that MRI also showed to MS lesions in the spinal cord, one at C6-C7 and one at T1.   Thoracic spine MRI in 2017 did not show any significant degenerative changes. Lumbar x-rays 2016 not show any significant finding.     She also noes some arm and leg weakness.  She has no no numbness bu thas hd tingling (long term form her MS).     MS:  She has been on Tysabri since March 2018 and she tolerates it well.   She did have an exacerbation (leg numbness and nystagmus) after her first dose treated with 3 days of IV Solu-Medrol.  Before Tysabri, she was on Aubagio but had breakthrough disease -- MRI 11/28/16 showed that she had about 7 new gadolinium enhancing lesions.  Most were small in the or deep white matter but one juxtacortical  focus on the left was moderate in size.   MRI of the cervical spine February 2017 showed 2 plaques in the spinal cord, one not present in May 2017.                                                                                                      Gait/strength/sensation: Her balance is better so gait is doing better as well since starting Tysabri.   Strength is more symmetric now.     There is no facial weakness or numbness.   Bladder/bowel: She has noted mild urinary frequency and urgency but no incontinence. There is no constipation.  Vision: She denies blurry vision or diplopia now. She was told she had ocular flutters at last eye exam.     Vertigo:   She has occasional spells of vertigo similar to last visit.  Fatigue/sleep: She has both physical and cognitive fatigue.  She feels her  insomnia is doing better on temazepam.   She was doing well with temazepam for her insomnia but sleep is worse recently due to her pain.  Mood/cognition: She denies depression and anxiety.      She denies any significant change in cognitive function. She notes some difficulty with memory, focus and verbal fluency.   MS History:   In mid May 2017, she was starting to experience occasional lightheadedness and a spinning vertigo. On 03/19/2016, she had more extreme vertigo and had an episode of syncope. She notices that when she was walking she would veer towards the right. Because of the syncope, she was taken to the emergency room. He had an MRI of the brain that showed several enhancing lesions, potentially worrisome for multiple sclerosis. Additional studies were performed. She received 3 days of IV steroids. The MRI's of the spine did not show any additional plaques. The lumbar puncture showed oligoclonal bands with normal IgG index, consistent with multiple sclerosis.  MRI images from 03/20/2016, 03/22/2016 and CT angiogram images from 03/23/2016 were personally reviewed. The MRI of the brain shows several 2/fair  hyperintense foci, some in the periventricular and juxtacortical white matter. Most of the foci enhanced after contrast administration. MRI of the cervical and thoracic spine did not show any spinal cord plaques and there was no significant degenerative change.   CT angiogram was essentially normal showing no significant stenosis.   She underwent a lumbar puncture on 03/21/2016. 4 oligoclonal bands present in the CSF which were not present in the serum, consistent with multiple sclerosis. The IgG index was high normal at 0.6.   There were only 4 white blood cells but 280 red blood cells more consistent with a slightly traumatic tap.    The [facility] Spotted Fever CSF IgG was negative but the IgM was positive at 1.41 (less than 0.9 normal).    ANA and ANCA were both negative.    REVIEW OF SYSTEMS: Constitutional: No fevers, chills, sweats, or change in appetite.   She has fatigue. Insomnia much better with temazepam Eyes: No visual changes, double vision, eye pain Ear, nose and throat: No hearing loss, ear pain, nasal congestion, sore throat Cardiovascular: No chest pain, palpitations Respiratory: No shortness of breath at rest or with exertion.   No wheezes GastrointestinaI: No nausea, vomiting, diarrhea, abdominal pain, fecal incontinence Genitourinary: No dysuria, urinary retention.   She reports frequency.  No nocturia. Musculoskeletal: No neck pain, back pain Integumentary: No rash, pruritus, skin lesions Neurological: as above Psychiatric: No depression at this time.  No anxiety Endocrine: No palpitations, diaphoresis, change in appetite, change in weigh or increased thirst Hematologic/Lymphatic: No anemia, purpura, petechiae. Allergic/Immunologic: No itchy/runny eyes, nasal congestion, recent allergic reactions, rashes  ALLERGIES: Allergies  Allergen Reactions  . Codeine Other (See Comments)    Hallucinations     HOME MEDICATIONS:  Current Outpatient Prescriptions:  .   Armodafinil 250 MG tablet, Take 1/2 to 1 pill po daily, Disp: 30 tablet, Rfl: 5 .  baclofen (LIORESAL) 10 MG tablet, TAKE 1 TABLET (10 MG TOTAL) BY MOUTH 3 (THREE) TIMES DAILY., Disp: 90 tablet, Rfl: 0 .  etodolac (LODINE) 400 MG tablet, Take 1 tablet (400 mg total) by mouth 2 (two) times daily., Disp: 60 tablet, Rfl: 5 .  gabapentin (NEURONTIN) 600 MG tablet, Take 1 tablet (600 mg total) by mouth 3 (three) times daily., Disp: 270 tablet, Rfl: 1 .  metoCLOPramide (REGLAN) 5 MG tablet, Take 1 tablet (5 mg total) by  mouth every 8 (eight) hours as needed for nausea., Disp: 90 tablet, Rfl: 1 .  naproxen sodium (ANAPROX) 220 MG tablet, Take 220 mg by mouth 2 (two) times daily as needed (for pain)., Disp: , Rfl:  .  natalizumab (TYSABRI) 300 MG/15ML injection, Inject 300 mg into the vein every 28 (twenty-eight) days., Disp: , Rfl:  .  temazepam (RESTORIL) 30 MG capsule, Take 1 capsule (30 mg total) by mouth at bedtime as needed for sleep., Disp: 30 capsule, Rfl: 5 .  Vitamin D, Ergocalciferol, (DRISDOL) 50000 units CAPS capsule, Take 1 capsule (50,000 Units total) by mouth every 7 (seven) days., Disp: 12 capsule, Rfl: 3  PAST MEDICAL HISTORY: Past Medical History:  Diagnosis Date  . Anxiety   . Headache   . Multiple sclerosis (HCC)   . Nystagmus 02/15/2017  . Syncope and collapse     PAST SURGICAL HISTORY: No past surgical history on file.  FAMILY HISTORY: Family History  Problem Relation Age of Onset  . Emphysema Mother   . Osteoarthritis Mother   . Other Father   . ALS Maternal Grandmother     SOCIAL HISTORY:  Social History   Social History  . Marital status: Married    Spouse name: N/A  . Number of children: N/A  . Years of education: N/A   Occupational History  . Not on file.   Social History Main Topics  . Smoking status: Current Every Day Smoker    Types: E-cigarettes  . Smokeless tobacco: Never Used  . Alcohol use 0.0 oz/week     Comment: rarely  . Drug use: No  .  Sexual activity: Not on file   Other Topics Concern  . Not on file   Social History Narrative  . No narrative on file     PHYSICAL EXAM  Vitals:   07/12/17 1333  BP: 127/88  Pulse: 91  Resp: 16  Weight: 190 lb 8 oz (86.4 kg)  Height:  (1.626 m)    Body mass index is 32.7 kg/m.   General: The patient is well-developed and well-nourished and in no acute distress  Musculoskeletal: She has moderate tenderness over the lower lumbar paraspinal muscles and milder tenderness over the piriformis muscles and the upper back and neck muscles.   Neurologic Exam  Mental status: The patient is alert and oriented x 3 at the time of the examination. The patient has apparent normal recent and remote memory, with an apparently normal attention span and concentration ability.   Speech is normal.  Cranial nerves: Extraocular muscles are intact. Pupils react to light and accommodation. Facial strength and sensation is normal..  Trapezius and sternocleidomastoid strength is normal. No dysarthria is noted.  The tongue is midline, and the patient has symmetric elevation of the soft palate. No obvious hearing deficits are noted.  Motor:  Muscle bulk is normal.   Tone is mildly increased in legs. Strength is  5 / 5 in all 4 extremities.   Sensory: She has reduced sensation to touch and normal vibration sensation legs.   Coordination: Cerebellar testing reveals good finger-nose-finger and heel-to-shin bilaterally.  Gait and station: Station is normal.   Gait is normal. Tandem gait is mildly wide. Romberg is negative.   Reflexes: Deep tendon reflexes are symmetric and normal in arms.  DTRs are brisk at knees but no spread and no ankle clonus       DIAGNOSTIC DATA (LABS, IMAGING, TESTING) - I reviewed patient records, labs, notes, testing and  imaging myself where available.  Lab Results  Component Value Date   WBC 6.6 04/25/2017   HGB 13.7 04/25/2017   HCT 40.7 04/25/2017   MCV 93  04/25/2017   PLT 289 04/25/2017      Component Value Date/Time   NA 137 03/19/2016 1929   K 3.7 03/19/2016 1929   CL 110 03/19/2016 1929   CO2 18 (L) 03/19/2016 1929   GLUCOSE 100 (H) 03/19/2016 1929   BUN 16 03/19/2016 1929   CREATININE 0.74 03/19/2016 1929   CALCIUM 8.9 03/19/2016 1929   PROT 6.1 12/04/2016 1131   ALBUMIN 3.7 12/04/2016 1131   AST 11 12/04/2016 1131   ALT 13 12/04/2016 1131   ALKPHOS 73 12/04/2016 1131   BILITOT <0.2 12/04/2016 1131   GFRNONAA >60 03/19/2016 1929   GFRAA >60 03/19/2016 1929    Lab Results  Component Value Date   TSH 1.400 04/25/2017       ASSESSMENT AND PLAN  Multiple sclerosis (HCC)  Ataxic gait  Other fatigue  Dysesthesia  Bilateral sciatica   1.    Continue Tysabri 300 mg every 4 weeks. Recheck JCV Ab and CBC.    2.    Trigger point injections of L4-L5 and L5-S1 paraspinal muscles bilaterally with 80 mg Depo-Medrol in Marcaine. She tolerated the injections well. Lodine 400 mg by mouth twice a day. If back and leg pain does not improve, consider an MRI to determine if she has herniated disc or other problem causing nerve root compression..  3.    Try to stay active and exercises as tolerated. 4.    Return for next infusion.  Call sooner if there are new or worsening neurologic symptoms.  Richard A. Epimenio Foot, MD, PhD 07/12/2017, 2:50 PM Certified in Neurology, Clinical Neurophysiology, Sleep Medicine, Pain Medicine and Neuroimaging  [facility] [address] [place], [place] [address] [phone] Mechele Collin

## 2017-07-19 ENCOUNTER — Other Ambulatory Visit: Payer: Self-pay | Admitting: *Deleted

## 2017-07-19 ENCOUNTER — Encounter: Payer: Self-pay | Admitting: Neurology

## 2017-07-19 DIAGNOSIS — M5416 Radiculopathy, lumbar region: Secondary | ICD-10-CM

## 2017-07-19 MED ORDER — MELOXICAM 15 MG PO TABS: 15.0000 mg | ORAL_TABLET | Freq: Every day | ORAL | 1 refills | Status: DC

## 2017-07-26 ENCOUNTER — Encounter: Payer: Self-pay | Admitting: Neurology

## 2017-07-26 ENCOUNTER — Other Ambulatory Visit: Payer: Self-pay | Admitting: *Deleted

## 2017-07-26 ENCOUNTER — Ambulatory Visit (HOSPITAL_COMMUNITY)
Admission: RE | Admit: 2017-07-26 | Discharge: 2017-07-26 | Disposition: A | Discharge status: Home / Self Care | Payer: Managed Care, Other (non HMO) | Source: Ambulatory Visit | Attending: Vascular Surgery | Admitting: Vascular Surgery

## 2017-07-26 ENCOUNTER — Telehealth: Payer: Self-pay | Admitting: *Deleted

## 2017-07-26 ENCOUNTER — Other Ambulatory Visit: Payer: Self-pay | Admitting: Neurology

## 2017-07-26 DIAGNOSIS — M79661 Pain in right lower leg: Secondary | ICD-10-CM

## 2017-07-26 DIAGNOSIS — G35 Multiple sclerosis: Secondary | ICD-10-CM

## 2017-07-26 DIAGNOSIS — M79604 Pain in right leg: Secondary | ICD-10-CM | POA: Insufficient documentation

## 2017-07-26 MED ORDER — HYDROCODONE-ACETAMINOPHEN 5-325 MG PO TABS: 1.0000 | ORAL_TABLET | Freq: Four times a day (QID) | ORAL | 0 refills | Status: DC | PRN

## 2017-07-26 MED ORDER — ROPINIROLE HCL 0.5 MG PO TABS: ORAL_TABLET | ORAL | 5 refills | Status: DC

## 2017-07-26 NOTE — Progress Notes (Signed)
She has had pain off and on in the right leg for the past 2 weeks that has been more constant the last 3 days. Pain is severe in intensity and is preventing her ability to get a good night sleep. Pain increases with squeezing the lower leg. There is no weakness. There is no numbness.  We will check a Doppler ultrasound of the right leg to rule out DVT. Hydrocodone for pain.

## 2017-07-26 NOTE — Telephone Encounter (Signed)
Pt. emailed with c/o increased leg cramps at night.  Per RAS, may be RLS component contributing to cramps. Requip 0.5mg  1-2 tabs daily at bedtime added, and pt. to continue Baclofen. Rx. escribed to [facility]

## 2017-07-27 ENCOUNTER — Encounter: Payer: Self-pay | Admitting: Neurology

## 2017-08-02 ENCOUNTER — Encounter: Payer: Self-pay | Admitting: Neurology

## 2017-08-02 ENCOUNTER — Ambulatory Visit
Admission: RE | Admit: 2017-08-02 | Discharge: 2017-08-02 | Disposition: A | Discharge status: Home / Self Care | Payer: Managed Care, Other (non HMO) | Source: Ambulatory Visit | Attending: Neurology | Admitting: Neurology

## 2017-08-02 DIAGNOSIS — M5416 Radiculopathy, lumbar region: Secondary | ICD-10-CM

## 2017-08-03 ENCOUNTER — Telehealth: Payer: Self-pay | Admitting: Neurology

## 2017-08-03 MED ORDER — METHYLPREDNISOLONE 4 MG PO TBPK
ORAL_TABLET | ORAL | 0 refills | Status: DC
Start: 2017-08-03 — End: 2018-01-24

## 2017-08-03 NOTE — Telephone Encounter (Signed)
MRI of the lumbar spine shows a small herniated disc at L5-S1 that contacts the S1 nerve roots. I will call in A steroid pack. If this does not help her, consider epidural steroid injection.

## 2017-08-22 ENCOUNTER — Encounter: Payer: Self-pay | Admitting: Neurology

## 2017-08-23 IMAGING — MR MR CERVICAL SPINE W/O CM
4 of 5 series · 18 of 48 positions shown · non-contrast
Comparison: Comparison made with previous brain MRI from
03/20/2016.

CLINICAL DATA: Initial evaluation for

EXAM:
MRI CERVICAL SPINE WITHOUT CONTRAST
TECHNIQUE: Multiplanar, multisequence MR imaging of the cervical spine was
performed. No intravenous contrast was administered.

[Series 3: T2 · sagittal · 3.0mm · 0.43mm/px · 6 of 14 slices shown (1 of 2)]
[im 1/14]
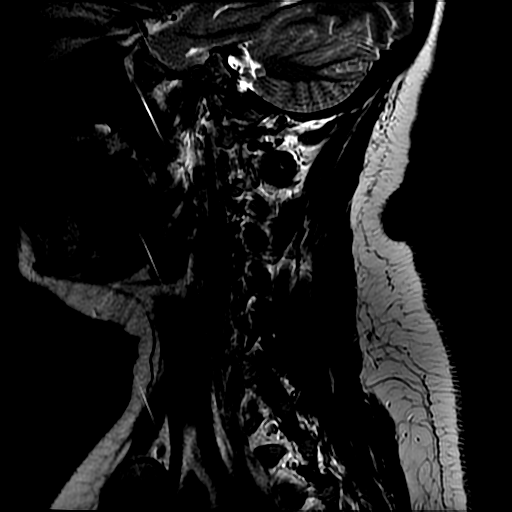
[im 3/14]
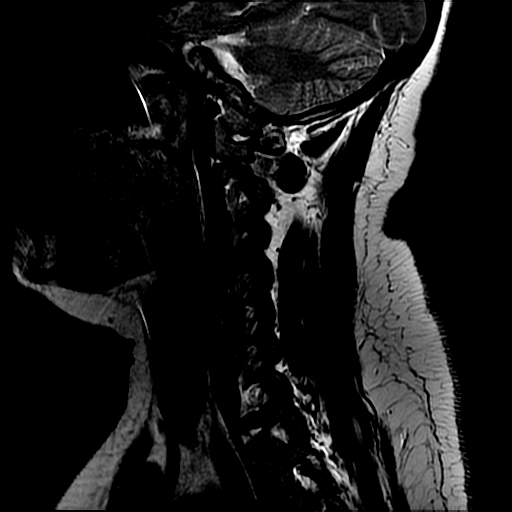
[im 6/14]
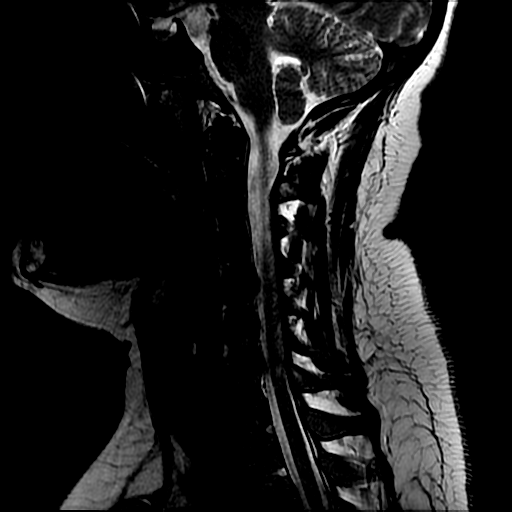
[im 8/14]
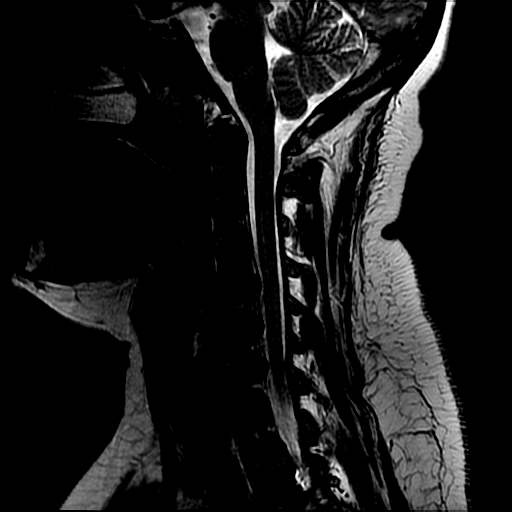
[im 11/14]
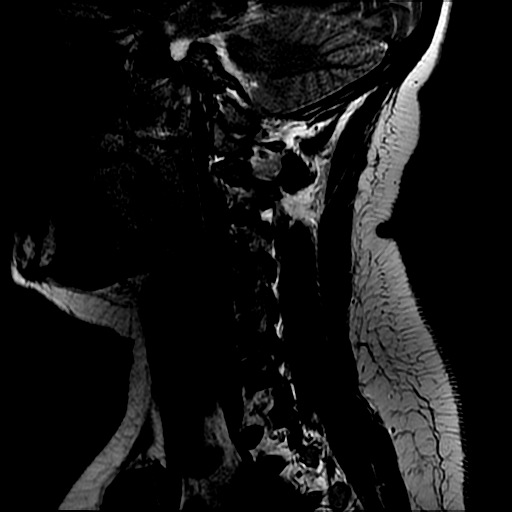
[im 14/14]
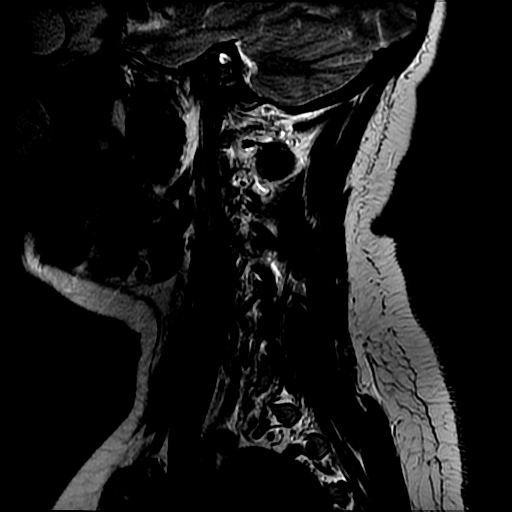

[Series 4: T1 · sagittal · 3.0mm · 0.43mm/px · 3 of 14 slices shown]
[im 3/14]
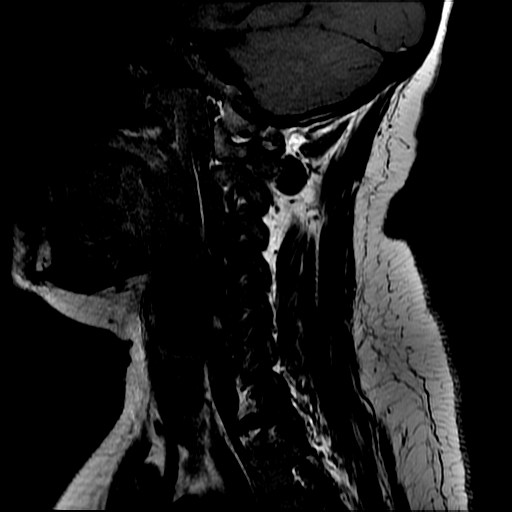
[im 7/14]
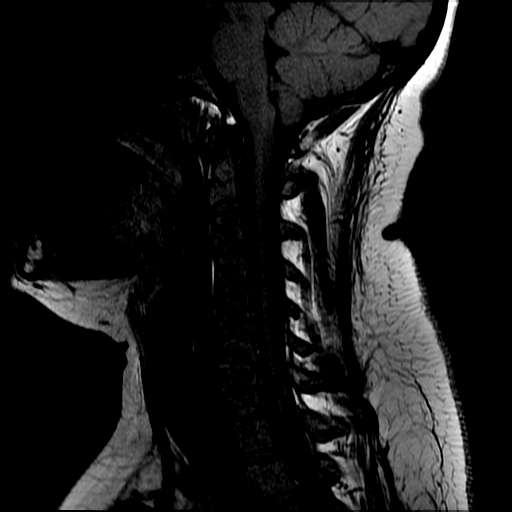
[im 11/14]
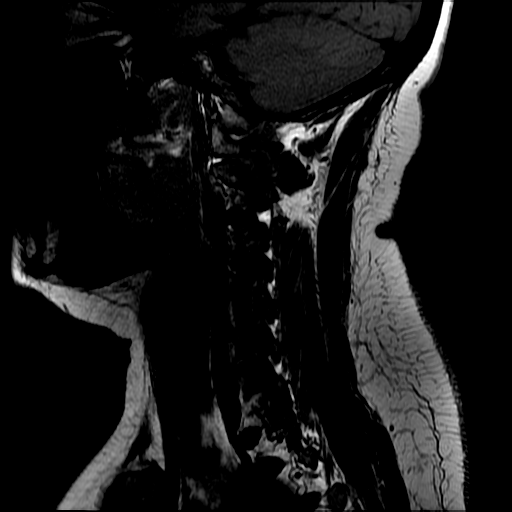

[Series 5: sag ir · sagittal · 3.0mm · 0.43mm/px · 3 of 14 slices shown]
[im 3/14]
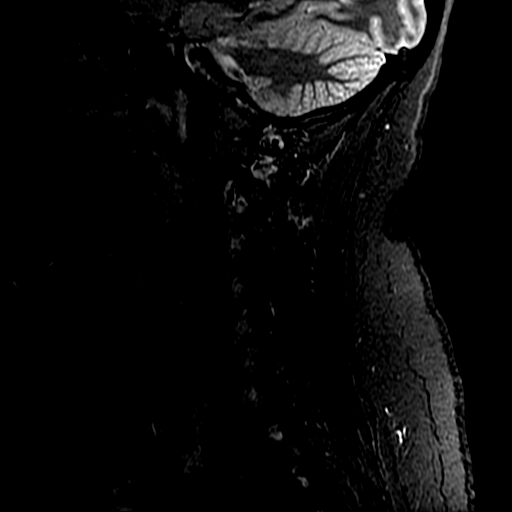
[im 7/14]
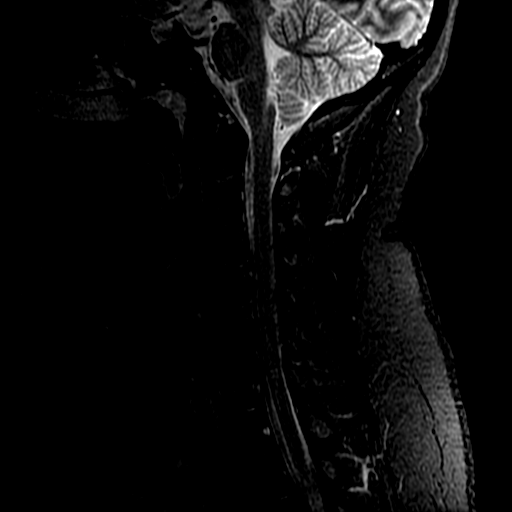
[im 11/14]
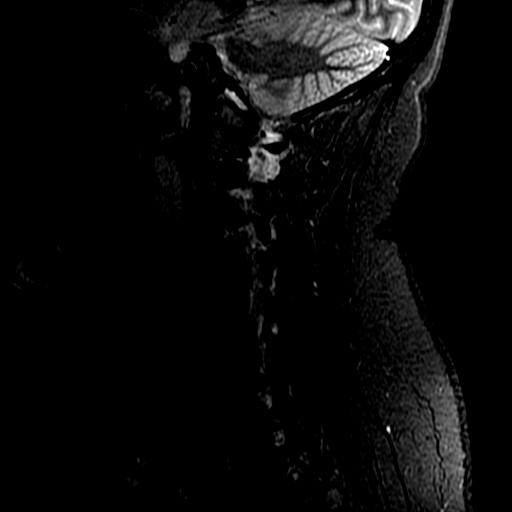

[Series 7: T2 · axial · 3.0mm · 0.39mm/px · z∈[-61,+20]mm · 6 of 30 slices shown (2 of 2)]
[im 1/30]
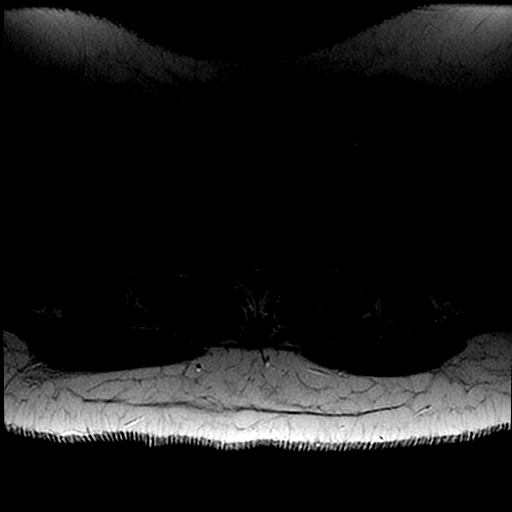
[im 5/30]
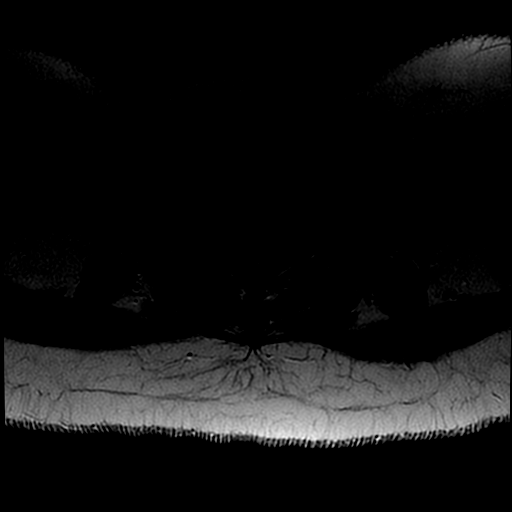
[im 9/30]
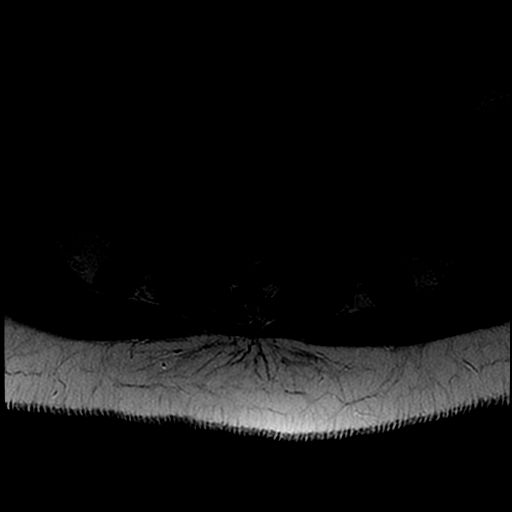
[im 14/30]
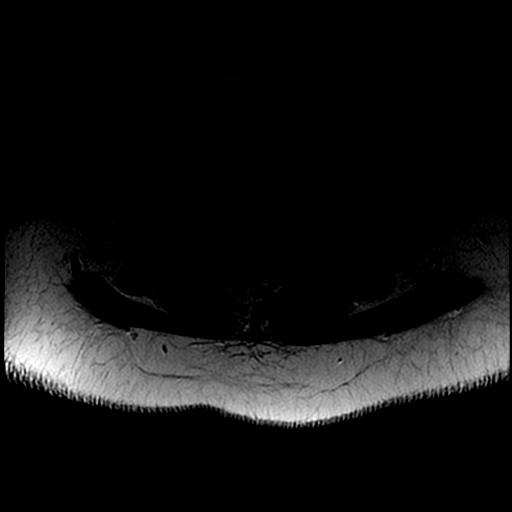
[im 16/30]
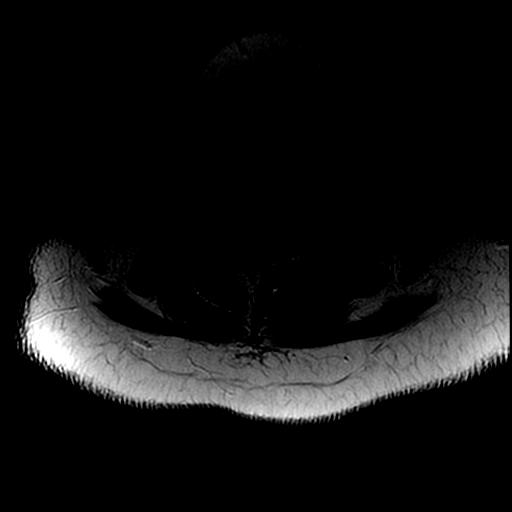
[im 25/30]
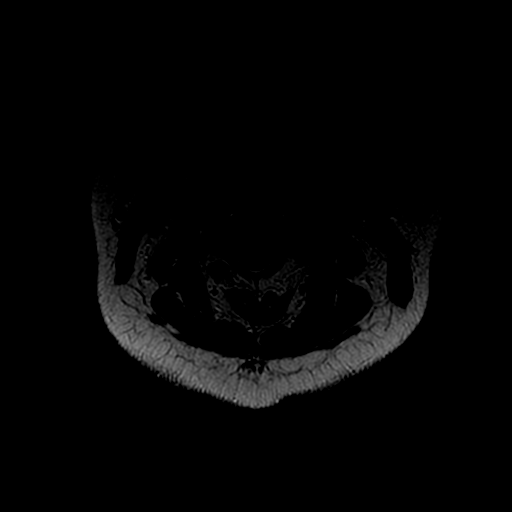

[18 of 48 positions shown; findings below may reference images not displayed]

FINDINGS: Alignment: Vertebral bodies are normally aligned with preservation
of the normal cervical lordosis. No listhesis or malalignment.

Vertebrae: Vertebral body heights are well maintained. No acute,
subacute, or chronic fracture. Signal intensity within the vertebral
body bone marrow is diffusely decreased on T1 signal intensity,
similar to that seen on previous brain MRI. Again, this is
nonspecific, but can be seen with underlying smoking, anemia,
obesity, or other limb for Rita Perumal disorder. No focal osseous
lesions. No marrow edema.

Cord: Signal intensity within the cervical spinal cord is normal. No
T2 hyperintense lesions to suggest underlying demyelinating disease
identified. No cord atrophy identified.

Posterior Fossa, vertebral arteries, paraspinal tissues: Paraspinous
soft tissues within normal limits. No prevertebral edema. Normal
intravascular flow voids present within the vertebral arteries
bilaterally.

Disc levels:

C2-C3: Negative.

C3-C4:  Negative.

C4-C5: Central disc protrusion indents the ventral thecal sac,
abutting the cervical spinal cord with minimal cord flattening. No
cord signal changes. Resultant mild canal stenosis. Mild
superimposed bilateral uncovertebral spurring without significant
foraminal stenosis.

C5-C6: Small central disc protrusion indents the ventral thecal sac
with mild cord flattening. No cord signal changes. Resultant mild
canal stenosis. Mild superimposed bilateral uncovertebral spurring
without significant foraminal stenosis.

C6-C7: Broad-based posterior disc protrusion indents the ventral
thecal sac and flattens the cervical spinal cord. No cord signal
changes. Bulging disc slightly eccentric to the right. Mild canal
stenosis. Superimposed bilateral uncovertebral spurring. Resultant
moderate right foraminal stenosis. More mild left foraminal
narrowing.

C7-T1:  Negative.

Visualized upper thoracic spine within normal limits.
IMPRESSION: 1. Normal MRI appearance of the cervical spinal cord. No focal
lesions to suggest underlying demyelinating disease.
2. Central disc protrusions at C4-5, C5-6, and C6-7 with resultant
mild canal stenosis.
3. Moderate right with mild left foraminal stenosis at C6-7 related
to disc bulge and uncovertebral spurring. No other significant
foraminal narrowing within the cervical spine.

## 2017-08-23 IMAGING — MR MR THORACIC SPINE W/O CM
4 of 6 series · 18 of 48 positions shown · non-contrast
Comparison: Prior brain MRI from 03/20/2016.

CLINICAL DATA: Initial evaluation for acute syncope, dizziness.
Concern for multiple sclerosis on previous brain MRI.

EXAM:
MRI THORACIC SPINE WITHOUT CONTRAST
TECHNIQUE: Multiplanar, multisequence MR imaging of the thoracic spine was
performed. No intravenous contrast was administered.

[Series 2: counting loc · sagittal · 4.0mm · 0.90mm/px · 3 of 8 slices shown]
[im 1/8]
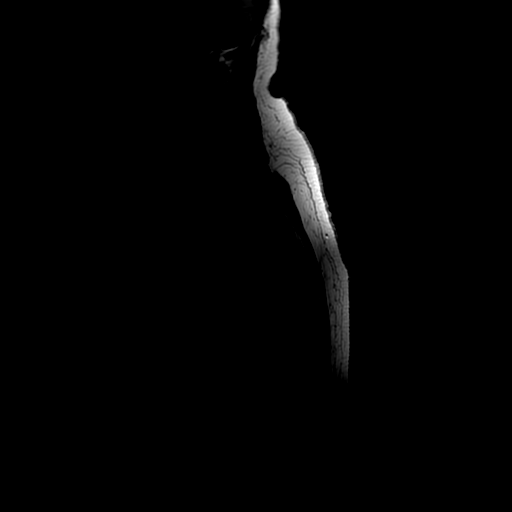
[im 4/8]
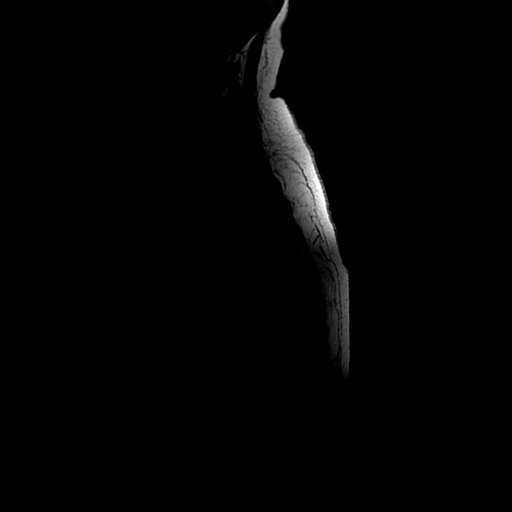
[im 8/8]
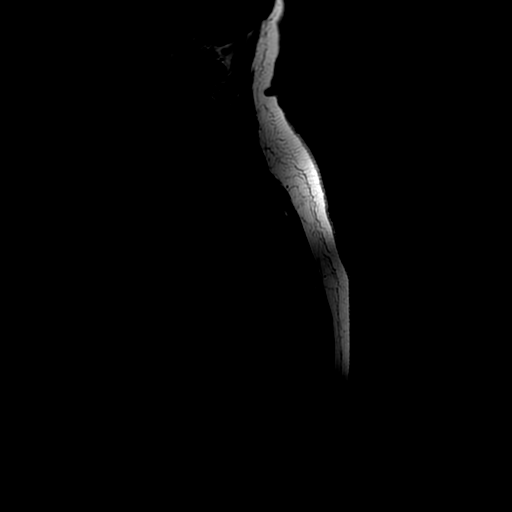

[Series 4: T2 · sagittal · 3.0mm · 0.62mm/px · 5 of 12 slices shown (1 of 2)]
[im 1/12]
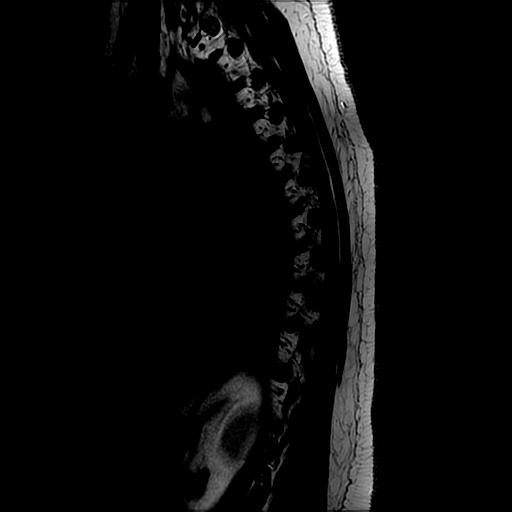
[im 3/12]
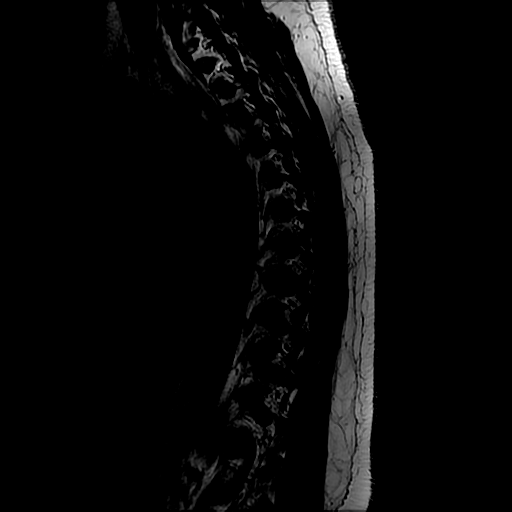
[im 6/12]
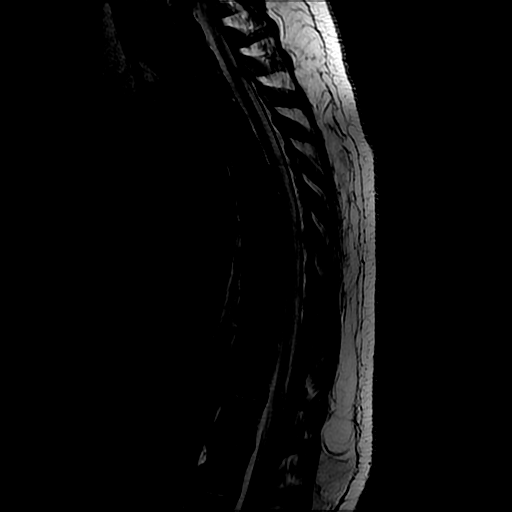
[im 9/12]
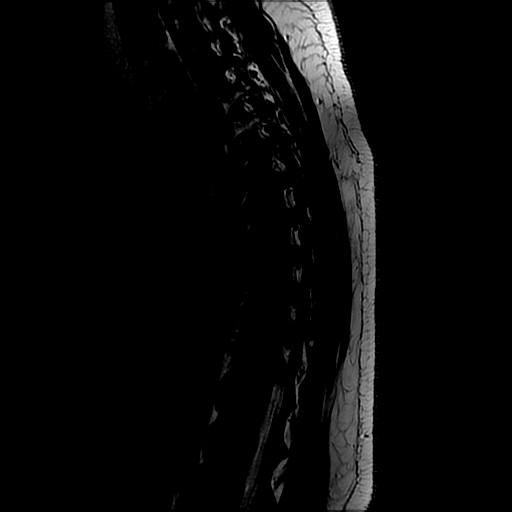
[im 12/12]
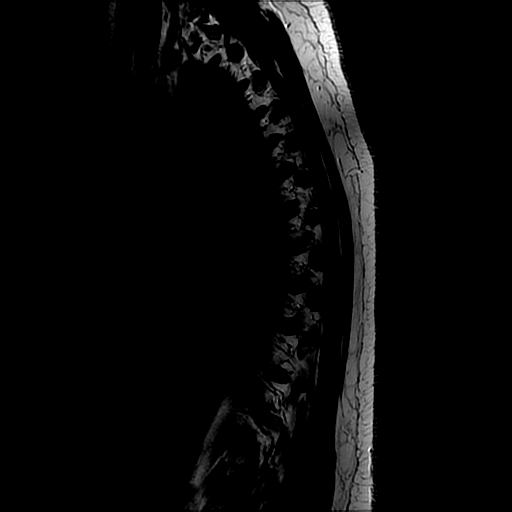

[Series 5: T1 · sagittal · 3.0mm · 0.62mm/px · 3 of 12 slices shown]
[im 1/12]
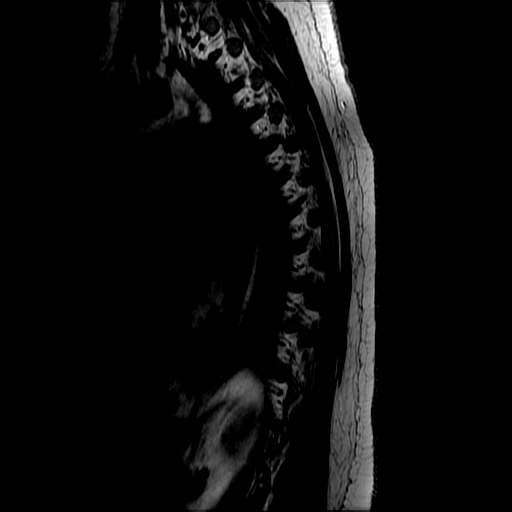
[im 6/12]
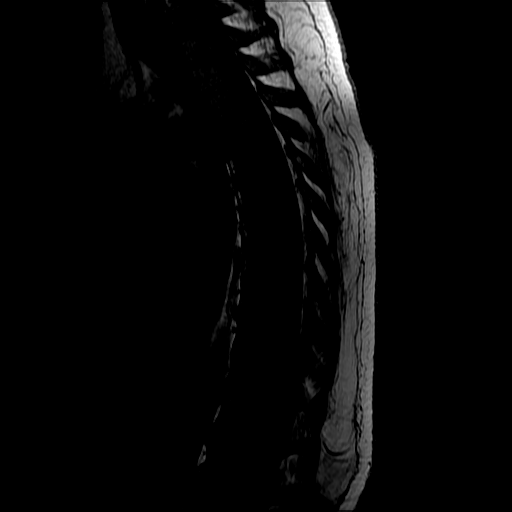
[im 12/12]
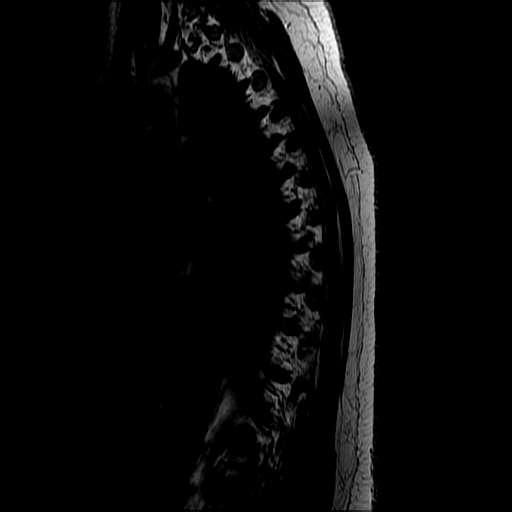

[Series 7: T2 · axial · 4.0mm · 0.43mm/px · z∈[-271,-104]mm · 7 of 39 slices shown (2 of 2)]
[im 1/39]
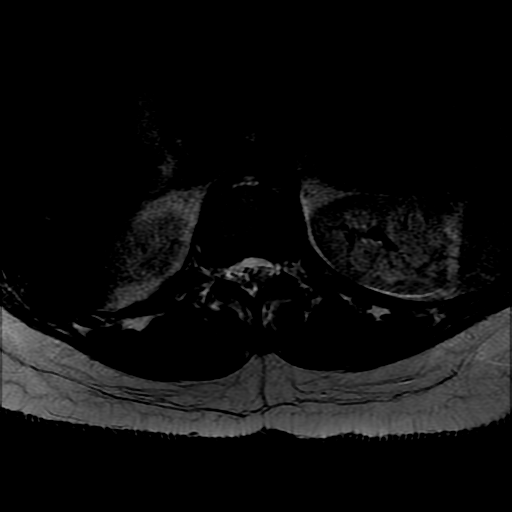
[im 6/39]
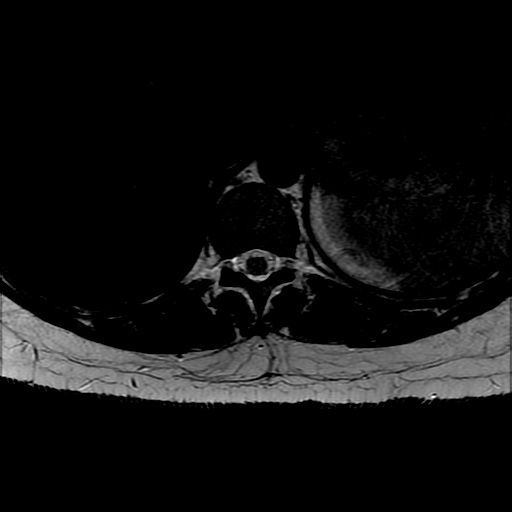
[im 11/39]
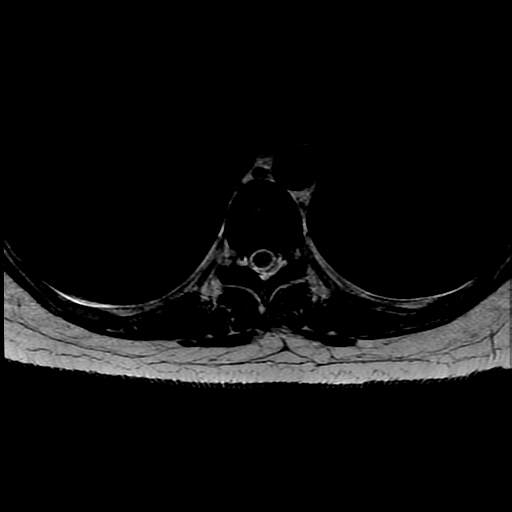
[im 17/39]
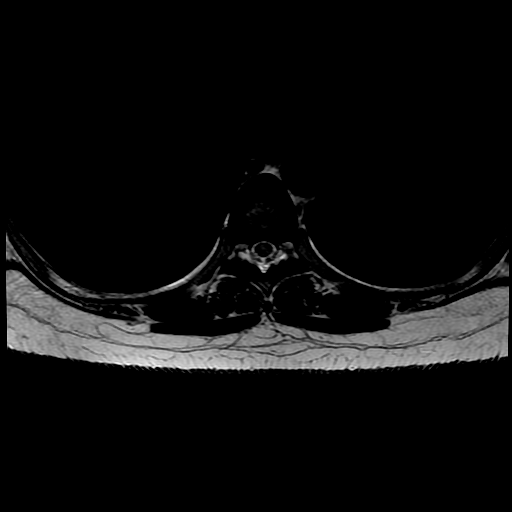
[im 20/39]
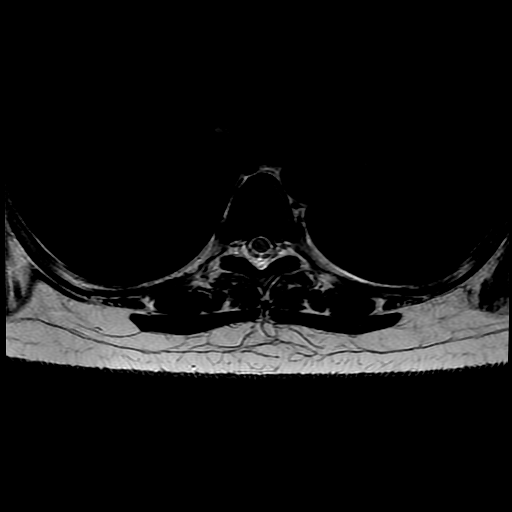
[im 22/39]
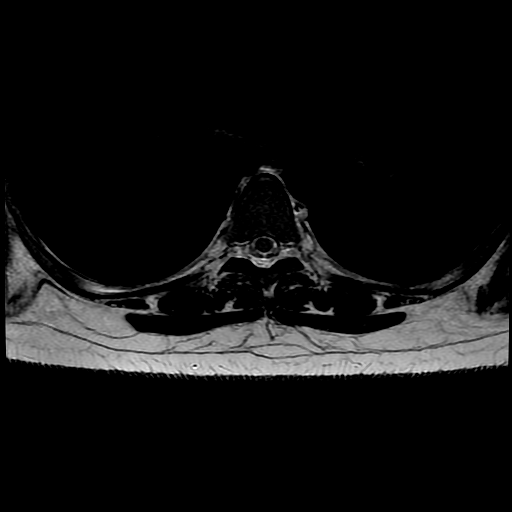
[im 33/39]
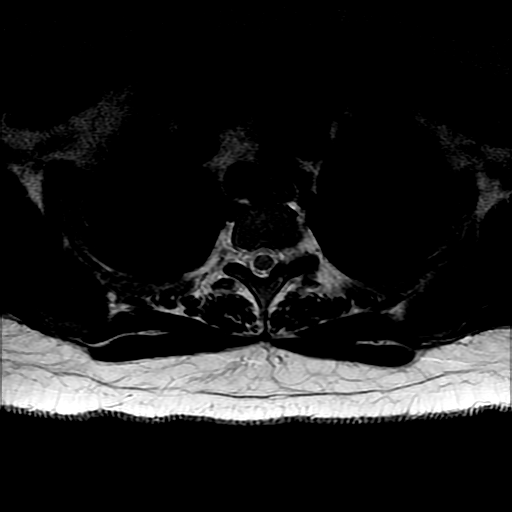

[18 of 48 positions shown; findings below may reference images not displayed]

FINDINGS: Alignment: Vertebral bodies are normally aligned with preservation
of the normal thoracic kyphosis. No listhesis or malalignment.

Vertebrae: Vertebral body height is well maintained. No evidence for
acute, subacute, or chronic fracture. Signal intensity within the
vertebral body bone marrow is diffusely decreased on T1 weighted
sequences, similar to previous studies. Again, this is nonspecific,
but most commonly related to anemia, smoking, or obesity. Possible
lymphoproliferative disorder could also be considered. No focal
osseous lesions. No marrow edema.

Cord: Signal intensity within the thoracic spinal cord is normal. No
focal lesions to suggest underlying demyelinating disease
identified. Conus medullaris terminates at the T12-L1 level. No
significant cord atrophy.

Paraspinal and other soft tissues: Paraspinal soft tissues are
normal in appearance without acute abnormality. Partially visualized
lungs are clear. Visualized aorta is normal in caliber and
appearance.

Disc levels: No significant degenerative changes identified within
the thoracic spine. No disc bulge or focal disc herniation. No canal
or foraminal stenosis. Degenerative disc bulge noted at C6-7 with
associated mild canal stenosis, better evaluated on concomitant MRI
of the cervical spine.
IMPRESSION: 1. Normal MRI appearance of the thoracic spinal cord. No focal
lesions to suggest underlying demyelinating disease or other process
identified.
2. No significant degenerative changes within the thoracic spine. No
disc herniation. No canal or foraminal stenosis.

## 2017-09-03 ENCOUNTER — Encounter: Payer: Self-pay | Admitting: Neurology

## 2017-09-03 ENCOUNTER — Ambulatory Visit (INDEPENDENT_AMBULATORY_CARE_PROVIDER_SITE_OTHER): Payer: Managed Care, Other (non HMO) | Admitting: Neurology

## 2017-09-03 VITALS — BP 110/80 | HR 74 | Resp 16 | Ht 64.0 in | Wt 192.5 lb

## 2017-09-03 DIAGNOSIS — M545 Low back pain, unspecified: Secondary | ICD-10-CM | POA: Insufficient documentation

## 2017-09-03 DIAGNOSIS — G8929 Other chronic pain: Secondary | ICD-10-CM | POA: Insufficient documentation

## 2017-09-03 DIAGNOSIS — R208 Other disturbances of skin sensation: Secondary | ICD-10-CM

## 2017-09-03 DIAGNOSIS — G35 Multiple sclerosis: Secondary | ICD-10-CM

## 2017-09-03 DIAGNOSIS — R5383 Other fatigue: Secondary | ICD-10-CM | POA: Diagnosis not present

## 2017-09-03 DIAGNOSIS — Z79899 Other long term (current) drug therapy: Secondary | ICD-10-CM

## 2017-09-03 DIAGNOSIS — M791 Myalgia, unspecified site: Secondary | ICD-10-CM

## 2017-09-03 MED ORDER — BACLOFEN 10 MG PO TABS: 10.0000 mg | ORAL_TABLET | Freq: Three times a day (TID) | ORAL | 11 refills | Status: DC

## 2017-09-03 MED ORDER — IMIPRAMINE HCL 25 MG PO TABS: 25.0000 mg | ORAL_TABLET | Freq: Every day | ORAL | 5 refills | Status: DC

## 2017-09-03 NOTE — Progress Notes (Signed)
[facility]  PATIENT: Virginia [name] DOB: Oct 20, 1981  REFERRING DOCTOR OR PCP:    Blair Heys SOURCE: patient, admit/discharge hospital notes, imaging reports, MRI's on PACS  _________________________________   HISTORICAL  CHIEF COMPLAINT:  Chief Complaint  Patient presents with  . Multiple Sclerosis    Sts. she continues to tolerate Tysabri well.  JCV ab last checked 04/25/17 and was negative at 0.13.  She c/o lbp and bilat hip pain, continued cramping in bilat calves, neck pain and bilat shoulder pain, at least 3 h/a's per wk,  Gabapentin, Meloxicam, Naproxen don't help.  Hydrocodone helped just a little, not enough.  Oral steroids helped just a little.  She only takes Baclofen at night, when she wakes up with cramps./fim  . Leg Pain    HISTORY OF PRESENT ILLNESS:  [name] is a 36 yo woman with multiple sclerosis Now reporting issues with low back pain radiating into her legs.  Update 09/03/2017:    She feels her MS is stable. She is tolerating Tysabri well and has not had any exacerbations.  She continues to report low back pain and bilateral hip pain. She has cramping spasms in her calves. She also reports neck pain and shoulder pain.   She has not had much benefit from gabapentin, meloxicam and Naprosyn. Hydrocodone and steroids helped just a little bit.    She takes baclofen only at night and it does not make her that sleepy.      She has headaches about 3 days a week.   Pain is bilateral in her forehead.   She has not noted any triggers and they seem to occur randomly.    Aleve helps mildly.   She denies N/V.    She has photophobia and phonophobia.    She used to only get occasional headaches.      She has neck pain that seems distinct from the headache.    She is not sleeping well many nights and has DPSD sleeping from 3 am to 11 am.   He usually wakes up for a few minutes around 7 AM and then falls back asleep  From 07/12/2017: LBP/Leg pain/nek:   She has pian all over right now but pan started in her lower back and radiates into her legs.   Pain is independent of position and OTC med's did not help.   Baclofen has not helped.    She has milder pain in her neck.   Pain is disturbing her sleep.     MRI 12/25/2016 showed that she has mild spinal stenosis at C4-C5, C5-C6 and C6-C7 due to disc protrusions and mild uncovertebral spurring that MRI also showed to MS lesions in the spinal cord, one at C6-C7 and one at T1.   Thoracic spine MRI in 2017 did not show any significant degenerative changes. Lumbar x-rays 2016 not show any significant finding.     She also noes some arm and leg weakness.  She has no no numbness bu thas hd tingling (long term form her MS).     MS:  She has been on Tysabri since March 2018 and she tolerates it well.   She did have an exacerbation (leg numbness and nystagmus) after her first dose treated with 3 days of IV Solu-Medrol.  Before Tysabri, she was on Aubagio but had breakthrough disease -- MRI 11/28/16 showed that she had about 7 new gadolinium enhancing lesions.  Most were small in the or deep white matter but one juxtacortical  focus on the left was moderate in size.   MRI of the cervical spine February 2017 showed 2 plaques in the spinal cord, one not present in May 2017.                                                                                                      Gait/strength/sensation: Her balance is better so gait is doing better as well since starting Tysabri.   Strength is more symmetric now.     There is no facial weakness or numbness.   Bladder/bowel: She has noted mild urinary frequency and urgency but no incontinence. There is no constipation.  Vision: She denies blurry vision or diplopia now. She was told she had ocular flutters at last eye exam.     Vertigo:   She has occasional spells of vertigo similar to last visit.  Fatigue/sleep: She has both physical and cognitive fatigue.  She feels her insomnia  is doing better on temazepam.   She was doing well with temazepam for her insomnia but sleep is worse recently due to her pain.  Mood/cognition: She denies depression and anxiety.      She denies any significant change in cognitive function. She notes some difficulty with memory, focus and verbal fluency.   MS History:   In mid May 2017, she was starting to experience occasional lightheadedness and a spinning vertigo. On 03/19/2016, she had more extreme vertigo and had an episode of syncope. She notices that when she was walking she would veer towards the right. Because of the syncope, she was taken to the emergency room. He had an MRI of the brain that showed several enhancing lesions, potentially worrisome for multiple sclerosis. Additional studies were performed. She received 3 days of IV steroids. The MRI's of the spine did not show any additional plaques. The lumbar puncture showed oligoclonal bands with normal IgG index, consistent with multiple sclerosis.  MRI images from 03/20/2016, 03/22/2016 and CT angiogram images from 03/23/2016 were personally reviewed. The MRI of the brain shows several 2/fair hyperintense foci, some in the periventricular and juxtacortical white matter. Most of the foci enhanced after contrast administration. MRI of the cervical and thoracic spine did not show any spinal cord plaques and there was no significant degenerative change.   CT angiogram was essentially normal showing no significant stenosis.   She underwent a lumbar puncture on 03/21/2016. 4 oligoclonal bands present in the CSF which were not present in the serum, consistent with multiple sclerosis. The IgG index was high normal at 0.6.   There were only 4 white blood cells but 280 red blood cells more consistent with a slightly traumatic tap.    [facility] IgG was negative but the IgM was positive at 1.41 (less than 0.9 normal).    ANA and ANCA were both negative.    REVIEW OF  SYSTEMS: Constitutional: No fevers, chills, sweats, or change in appetite.   She has fatigue. Insomnia much better with temazepam Eyes: No visual changes, double vision, eye pain Ear, nose and throat: No  hearing loss, ear pain, nasal congestion, sore throat Cardiovascular: No chest pain, palpitations Respiratory: No shortness of breath at rest or with exertion.   No wheezes GastrointestinaI: No nausea, vomiting, diarrhea, abdominal pain, fecal incontinence Genitourinary: No dysuria, urinary retention.   She reports frequency.  No nocturia. Musculoskeletal: No neck pain, back pain Integumentary: No rash, pruritus, skin lesions Neurological: as above Psychiatric: No depression at this time.  No anxiety Endocrine: No palpitations, diaphoresis, change in appetite, change in weigh or increased thirst Hematologic/Lymphatic: No anemia, purpura, petechiae. Allergic/Immunologic: No itchy/runny eyes, nasal congestion, recent allergic reactions, rashes  ALLERGIES: Allergies  Allergen Reactions  . Codeine Other (See Comments)    Hallucinations     HOME MEDICATIONS:  Current Outpatient Medications:  .  baclofen (LIORESAL) 10 MG tablet, Take 1 tablet (10 mg total) 3 (three) times daily by mouth., Disp: 90 tablet, Rfl: 11 .  gabapentin (NEURONTIN) 600 MG tablet, Take 1 tablet (600 mg total) by mouth 3 (three) times daily., Disp: 270 tablet, Rfl: 1 .  HYDROcodone-acetaminophen (NORCO) 5-325 MG tablet, Take 1 tablet by mouth every 6 (six) hours as needed for moderate pain., Disp: 30 tablet, Rfl: 0 .  imipramine (TOFRANIL) 25 MG tablet, Take 1 tablet (25 mg total) at bedtime by mouth., Disp: 60 tablet, Rfl: 5 .  meloxicam (MOBIC) 15 MG tablet, Take 1 tablet (15 mg total) by mouth daily., Disp: 30 tablet, Rfl: 1 .  methylPREDNISolone (MEDROL DOSEPAK) 4 MG TBPK tablet, Take 6 po for 1 day, 5 by mouth for 1 day, 4 po for 1 day, 3 po for 1 day, 2 po for 1 day, one by mouth for 1 day., Disp: 21 tablet,  Rfl: 0 .  metoCLOPramide (REGLAN) 5 MG tablet, Take 1 tablet (5 mg total) by mouth every 8 (eight) hours as needed for nausea., Disp: 90 tablet, Rfl: 1 .  naproxen sodium (ANAPROX) 220 MG tablet, Take 220 mg by mouth 2 (two) times daily as needed (for pain)., Disp: , Rfl:  .  natalizumab (TYSABRI) 300 MG/15ML injection, Inject 300 mg into the vein every 28 (twenty-eight) days., Disp: , Rfl:  .  rOPINIRole (REQUIP) 0.5 MG tablet, Take 1-2 tablets daily at bedtime., Disp: 60 tablet, Rfl: 5 .  temazepam (RESTORIL) 30 MG capsule, Take 1 capsule (30 mg total) by mouth at bedtime as needed for sleep., Disp: 30 capsule, Rfl: 5 .  Vitamin D, Ergocalciferol, (DRISDOL) 50000 units CAPS capsule, Take 1 capsule (50,000 Units total) by mouth every 7 (seven) days., Disp: 12 capsule, Rfl: 3  PAST MEDICAL HISTORY: Past Medical History:  Diagnosis Date  . Anxiety   . Headache   . Multiple sclerosis (HCC)   . Nystagmus 02/15/2017  . Syncope and collapse     PAST SURGICAL HISTORY: History reviewed. No pertinent surgical history.  FAMILY HISTORY: Family History  Problem Relation Age of Onset  . Emphysema Mother   . Osteoarthritis Mother   . Other Father   . ALS Maternal Grandmother     SOCIAL HISTORY:  Social History   Socioeconomic History  . Marital status: Married    Spouse name: Not on file  . Number of children: Not on file  . Years of education: Not on file  . Highest education level: Not on file  Social Needs  . Financial resource strain: Not on file  . Food insecurity - worry: Not on file  . Food insecurity - inability: Not on file  . Transportation needs - medical:  Not on file  . Transportation needs - non-medical: Not on file  Occupational History  . Not on file  Tobacco Use  . Smoking status: Current Every Day Smoker    Types: E-cigarettes  . Smokeless tobacco: Never Used  Substance and Sexual Activity  . Alcohol use: Yes    Alcohol/week: 0.0 oz    Comment: rarely  .  Drug use: No  . Sexual activity: Not on file  Other Topics Concern  . Not on file  Social History Narrative  . Not on file     PHYSICAL EXAM  Vitals:   09/03/17 1351  BP: 110/80  Pulse: 74  Resp: 16  Weight: 192 lb 8 oz (87.3 kg)  Height: 5\' 4"  (1.626 m)    Body mass index is 33.04 kg/m.   General: The patient is well-developed and well-nourished and in no acute distress,.  Funduscopic examination shows normal optic discs and retinal vessels.  Musculoskeletal: She has moderate tenderness over the lower lumbar paraspinal muscles and milder tenderness over the piriformis muscles and the upper back and neck muscles.   Mild tenderness over the trochanteric bursae   Neurologic Exam  Mental status: The patient is alert and oriented x 3 at the time of the examination. The patient has apparent normal recent and remote memory, with an apparently normal attention span and concentration ability.   Speech is normal.  Cranial nerves: Extraocular muscles are intact. Pupils react equally to light and accommodation. Facial strength and sensation is normal. Trapezius strength is normal.  The tongue is midline, and the patient has symmetric elevation of the soft palate. No obvious hearing deficits are noted.  Motor:  Muscle bulk is normal.   Tone is mildly increased in legs. Strength is  5 / 5 in all 4 extremities.   Sensory: She has reduced sensation to touch and normal vibration sensation legs.   Coordination: Cerebellar testing reveals good finger-nose-finger and heel-to-shin bilaterally.  Gait and station: Station is normal.   Gait is normal. Tandem gait is mildly wide. Romberg is negative.   Reflexes: Deep tendon reflexes are symmetric and normal in arms.  DTRs are brisk at knees but no spread and no ankle clonus       DIAGNOSTIC DATA (LABS, IMAGING, TESTING) - I reviewed patient records, labs, notes, testing and imaging myself where available.  Lab Results  Component Value Date    WBC 6.6 04/25/2017   HGB 13.7 04/25/2017   HCT 40.7 04/25/2017   MCV 93 04/25/2017   PLT 289 04/25/2017      Component Value Date/Time   NA 137 03/19/2016 1929   K 3.7 03/19/2016 1929   CL 110 03/19/2016 1929   CO2 18 (L) 03/19/2016 1929   GLUCOSE 100 (H) 03/19/2016 1929   BUN 16 03/19/2016 1929   CREATININE 0.74 03/19/2016 1929   CALCIUM 8.9 03/19/2016 1929   PROT 6.1 12/04/2016 1131   ALBUMIN 3.7 12/04/2016 1131   AST 11 12/04/2016 1131   ALT 13 12/04/2016 1131   ALKPHOS 73 12/04/2016 1131   BILITOT <0.2 12/04/2016 1131   GFRNONAA >60 03/19/2016 1929   GFRAA >60 03/19/2016 1929    Lab Results  Component Value Date   TSH 1.400 04/25/2017       ASSESSMENT AND PLAN  Multiple sclerosis (HCC)  High risk medication use  Other fatigue  Dysesthesia  Myalgia  Midline low back pain without sciatica, unspecified chronicity   1.    Continue Tysabri 300  mg every 4 weeks.   2.    Add imipramine 25 mg nightly and titrate to 50 mg if tolerated. Meloxicam to one half pill.   Increase baclofen to up to 3 times a day.  3.  Try to stay active and exercise as tolerated.  4.    Return 4 months.  Call sooner if there are new or worsening neurologic symptoms.  Richard A. Epimenio Foot, MD, PhD 09/03/2017, 2:53 PM Certified in Neurology, Clinical Neurophysiology, Sleep Medicine, Pain Medicine and Neuroimaging  [facility] [address] [place], [place] [address] [phone] Mechele Collin

## 2017-09-06 ENCOUNTER — Encounter: Payer: Self-pay | Admitting: Neurology

## 2017-09-06 ENCOUNTER — Other Ambulatory Visit: Payer: Self-pay | Admitting: *Deleted

## 2017-09-06 DIAGNOSIS — R55 Syncope and collapse: Secondary | ICD-10-CM

## 2017-09-07 ENCOUNTER — Other Ambulatory Visit: Payer: Self-pay | Admitting: *Deleted

## 2017-09-07 DIAGNOSIS — M5442 Lumbago with sciatica, left side: Principal | ICD-10-CM

## 2017-09-07 DIAGNOSIS — M5441 Lumbago with sciatica, right side: Secondary | ICD-10-CM

## 2017-09-10 ENCOUNTER — Telehealth: Payer: Self-pay | Admitting: Neurology

## 2017-09-10 NOTE — Telephone Encounter (Signed)
11/12-LVM for pt to call and schedule an EEG ordered by Dr. Fransico Meadow

## 2017-09-13 ENCOUNTER — Other Ambulatory Visit: Payer: Self-pay | Admitting: Neurology

## 2017-09-19 DIAGNOSIS — Z0271 Encounter for disability determination: Secondary | ICD-10-CM

## 2017-09-27 ENCOUNTER — Encounter: Payer: Self-pay | Admitting: Neurology

## 2017-09-27 ENCOUNTER — Ambulatory Visit (INDEPENDENT_AMBULATORY_CARE_PROVIDER_SITE_OTHER): Payer: Managed Care, Other (non HMO)

## 2017-09-27 DIAGNOSIS — R55 Syncope and collapse: Secondary | ICD-10-CM | POA: Diagnosis not present

## 2017-10-01 ENCOUNTER — Encounter: Payer: Self-pay | Admitting: *Deleted

## 2017-10-09 ENCOUNTER — Encounter: Payer: Self-pay | Admitting: Neurology

## 2017-10-17 ENCOUNTER — Ambulatory Visit: Payer: Managed Care, Other (non HMO) | Admitting: Neurology

## 2017-10-25 ENCOUNTER — Other Ambulatory Visit: Payer: Self-pay | Admitting: *Deleted

## 2017-10-25 ENCOUNTER — Encounter: Payer: Self-pay | Admitting: Neurology

## 2017-10-25 MED ORDER — IMIPRAMINE HCL 25 MG PO TABS: 25.0000 mg | ORAL_TABLET | Freq: Every day | ORAL | 1 refills | Status: DC

## 2017-10-30 DIAGNOSIS — I471 Supraventricular tachycardia, unspecified: Secondary | ICD-10-CM

## 2017-10-30 HISTORY — DX: Supraventricular tachycardia, unspecified: I47.10

## 2017-11-02 ENCOUNTER — Encounter: Payer: Self-pay | Admitting: Neurology

## 2017-11-02 MED ORDER — GABAPENTIN 600 MG PO TABS: 600.0000 mg | ORAL_TABLET | Freq: Four times a day (QID) | ORAL | 1 refills | Status: DC

## 2017-11-02 MED ORDER — IMIPRAMINE HCL 25 MG PO TABS: 50.0000 mg | ORAL_TABLET | Freq: Every day | ORAL | 1 refills | Status: DC

## 2017-11-05 ENCOUNTER — Encounter: Payer: Self-pay | Admitting: Neurology

## 2017-11-08 ENCOUNTER — Other Ambulatory Visit: Payer: Self-pay | Admitting: Neurology

## 2017-11-14 ENCOUNTER — Telehealth: Payer: Self-pay | Admitting: Neurology

## 2017-11-14 NOTE — Telephone Encounter (Signed)
Patient has tried to be reached ser val  times . Patient has not called to schedule . Patient has not returned any telephone calls.

## 2017-11-14 NOTE — Telephone Encounter (Signed)
Noted.  [facility] has made mult. attempts to contact pt. to schedule esi, but pt. does not return calls/fim

## 2017-11-22 ENCOUNTER — Encounter: Payer: Self-pay | Admitting: Neurology

## 2017-11-26 ENCOUNTER — Other Ambulatory Visit: Payer: Self-pay | Admitting: Neurology

## 2017-11-26 MED ORDER — PREDNISONE 5 MG PO TABS: ORAL_TABLET | ORAL | 0 refills | Status: DC

## 2017-12-03 ENCOUNTER — Ambulatory Visit
Admission: RE | Admit: 2017-12-03 | Discharge: 2017-12-03 | Disposition: A | Discharge status: Home / Self Care | Payer: Managed Care, Other (non HMO) | Source: Ambulatory Visit | Attending: Neurology | Admitting: Neurology

## 2017-12-03 DIAGNOSIS — M5441 Lumbago with sciatica, right side: Secondary | ICD-10-CM

## 2017-12-03 DIAGNOSIS — M5442 Lumbago with sciatica, left side: Principal | ICD-10-CM

## 2017-12-03 MED ORDER — METHYLPREDNISOLONE ACETATE 40 MG/ML INJ SUSP (RADIOLOG
120.0000 mg | Freq: Once | INTRAMUSCULAR | Status: AC
  Administered 2017-12-03: 120 mg via EPIDURAL

## 2017-12-03 MED ORDER — IOPAMIDOL (ISOVUE-M 200) INJECTION 41%
1.0000 mL | Freq: Once | INTRAMUSCULAR | Status: AC
  Administered 2017-12-03: 1 mL via EPIDURAL

## 2017-12-03 NOTE — Discharge Instructions (Signed)

## 2017-12-05 DIAGNOSIS — Z0271 Encounter for disability determination: Secondary | ICD-10-CM

## 2017-12-17 ENCOUNTER — Encounter: Payer: Self-pay | Admitting: *Deleted

## 2017-12-19 ENCOUNTER — Encounter: Payer: Self-pay | Admitting: Neurology

## 2017-12-19 ENCOUNTER — Other Ambulatory Visit: Payer: Self-pay | Admitting: *Deleted

## 2017-12-19 DIAGNOSIS — M5416 Radiculopathy, lumbar region: Secondary | ICD-10-CM

## 2018-01-01 ENCOUNTER — Ambulatory Visit (INDEPENDENT_AMBULATORY_CARE_PROVIDER_SITE_OTHER): Payer: Managed Care, Other (non HMO) | Admitting: Neurology

## 2018-01-01 ENCOUNTER — Encounter: Payer: Self-pay | Admitting: Neurology

## 2018-01-01 ENCOUNTER — Other Ambulatory Visit: Payer: Self-pay

## 2018-01-01 VITALS — BP 151/83 | HR 124 | Resp 18 | Ht 64.0 in | Wt 200.0 lb

## 2018-01-01 DIAGNOSIS — M542 Cervicalgia: Secondary | ICD-10-CM

## 2018-01-01 DIAGNOSIS — M5431 Sciatica, right side: Secondary | ICD-10-CM

## 2018-01-01 DIAGNOSIS — R42 Dizziness and giddiness: Secondary | ICD-10-CM

## 2018-01-01 DIAGNOSIS — R260 Ataxic gait: Secondary | ICD-10-CM | POA: Diagnosis not present

## 2018-01-01 DIAGNOSIS — R5383 Other fatigue: Secondary | ICD-10-CM

## 2018-01-01 DIAGNOSIS — G35 Multiple sclerosis: Secondary | ICD-10-CM | POA: Diagnosis not present

## 2018-01-01 DIAGNOSIS — Z79899 Other long term (current) drug therapy: Secondary | ICD-10-CM

## 2018-01-01 DIAGNOSIS — R002 Palpitations: Secondary | ICD-10-CM

## 2018-01-01 DIAGNOSIS — G4700 Insomnia, unspecified: Secondary | ICD-10-CM | POA: Diagnosis not present

## 2018-01-01 DIAGNOSIS — M5432 Sciatica, left side: Secondary | ICD-10-CM

## 2018-01-01 DIAGNOSIS — G250 Essential tremor: Secondary | ICD-10-CM

## 2018-01-01 MED ORDER — TEMAZEPAM 30 MG PO CAPS: 30.0000 mg | ORAL_CAPSULE | Freq: Every evening | ORAL | 5 refills | Status: DC | PRN

## 2018-01-01 NOTE — Progress Notes (Signed)
[facility]  PATIENT: Virginia [name] DOB: 1980-12-23  REFERRING DOCTOR OR PCP:    Blair Heys SOURCE: patient, admit/discharge hospital notes, imaging reports, MRI's on PACS  _________________________________   HISTORICAL  CHIEF COMPLAINT:  Chief Complaint  Patient presents with  . Multiple Sclerosis    Sts. she continues to tolerate Tysabri well.  JCV ab last checked 04/25/17 and was negative at 0.13. Sts. she continues to have neck, lbp, bilat feet pain, and now intermittent numbness bilat feet.  Stated ESI only helped for a couple of days. Mult med. changes did not help, so she was referred to Pain [facility].  Sts. she has not heard back about scheduling an appt.  I'll see if Annabelle Harman can check on this referral/fim    HISTORY OF PRESENT ILLNESS:  Virginia [name] is a 37 yo woman with multiple sclerosis who also has back pain.  Update 01/01/2018:  She feels that her MS has been stable. She is on Tysabri and has tolerated it well. She has not had any definite exacerbations.    She has noted fluctuating numbness in her toes and hands.   .   She has had some episodes where she feels she is having palpitations lasting minutes.    She does not know if there is any lightheadedness.     Her pulse is rapid.    This is occurring multiple times a day.    Sometimes she shakes and sometimes vision seems blurry.   This can occur in any position.     She had an ESI 4 weeks ago and it only helped slightly x 2-3 days.   She had a disc protrusion at L5-S1 that could contact the S1 nerve roots.    Pain management clinic has been contacted but she has not heard back yet.    She takes gabapentin 600 mg po qid but it has not helped her much.   She feels a little out of it at time.s    She also takes baclofen tid.      She still has a headache every day in both temples.   Moving does not change the HA.   No nausea or photophobia.      She sleeps about the same --- she has DPSD and can't sleep  before 2 -3 am.   Temazepam has helped her fall asleep when she lays down. If she takes temazepam, she can't fall asleep before 2 AM.  She has had a tremor that is a little worse in her hands. She notes it most when she tries to write or eat or hold something  Update 09/03/2017:    She feels her MS is stable. She is tolerating Tysabri well and has not had any exacerbations.  She continues to report low back pain and bilateral hip pain. She has cramping spasms in her calves. She also reports neck pain and shoulder pain.   She has not had much benefit from gabapentin, meloxicam and Naprosyn. Hydrocodone and steroids helped just a little bit.    She takes baclofen only at night and it does not make her that sleepy.      She has headaches about 3 days a week.   Pain is bilateral in her forehead.   She has not noted any triggers and they seem to occur randomly.    Aleve helps mildly.   She denies N/V.    She has photophobia and phonophobia.    She used to only  get occasional headaches.      She has neck pain that seems distinct from the headache.    She is not sleeping well many nights and has DPSD sleeping from 3 am to 11 am.   He usually wakes up for a few minutes around 7 AM and then falls back asleep  From 07/12/2017: LBP/Leg pain/nek:  She has pian all over right now but pan started in her lower back and radiates into her legs.   Pain is independent of position and OTC med's did not help.   Baclofen has not helped.    She has milder pain in her neck.   Pain is disturbing her sleep.     MRI 12/25/2016 showed that she has mild spinal stenosis at C4-C5, C5-C6 and C6-C7 due to disc protrusions and mild uncovertebral spurring that MRI also showed two MS lesions in the spinal cord, one at C6-C7 and one at T1.   Thoracic spine MRI in 2017 did not show any significant degenerative changes. Lumbar x-rays 2016 not show any significant finding.     She also noes some arm and leg weakness.  She has no no numbness bu  thas hd tingling (long term form her MS).     MS:  She has been on Tysabri since March 2018 and she tolerates it well.   She did have an exacerbation (leg numbness and nystagmus) after her first dose treated with 3 days of IV Solu-Medrol.  Before Tysabri, she was on Aubagio but had breakthrough disease -- MRI 11/28/16 showed that she had about 7 new gadolinium enhancing lesions.  Most were small in the or deep white matter but one juxtacortical focus on the left was moderate in size.   MRI of the cervical spine February 2017 showed 2 plaques in the spinal cord, one not present in May 2017.                                                                                                      Gait/strength/sensation: Her balance is better so gait is doing better as well since starting Tysabri.   Strength is more symmetric now.     There is no facial weakness or numbness.   Bladder/bowel: She has noted mild urinary frequency and urgency but no incontinence. There is no constipation.  Vision: She denies blurry vision or diplopia now. She was told she had ocular flutters at last eye exam.     Vertigo:   She has occasional spells of vertigo similar to last visit.  Fatigue/sleep: She has both physical and cognitive fatigue.  She feels her insomnia is doing better on temazepam.   She was doing well with temazepam for her insomnia but sleep is worse recently due to her pain.  Mood/cognition: She denies depression and anxiety.      She denies any significant change in cognitive function. She notes some difficulty with memory, focus and verbal fluency.   MS History:   In mid May 2017, she was starting to experience occasional lightheadedness and a spinning vertigo. On 03/19/2016,  she had more extreme vertigo and had an episode of syncope. She notices that when she was walking she would veer towards the right. Because of the syncope, she was taken to the emergency room. He had an MRI of the brain that showed several  enhancing lesions, potentially worrisome for multiple sclerosis. Additional studies were performed. She received 3 days of IV steroids. The MRI's of the spine did not show any additional plaques. The lumbar puncture showed oligoclonal bands with normal IgG index, consistent with multiple sclerosis.  MRI images from 03/20/2016, 03/22/2016 and CT angiogram images from 03/23/2016 were personally reviewed. The MRI of the brain shows several 2/fair hyperintense foci, some in the periventricular and juxtacortical white matter. Most of the foci enhanced after contrast administration. MRI of the cervical and thoracic spine did not show any spinal cord plaques and there was no significant degenerative change.   CT angiogram was essentially normal showing no significant stenosis.   She underwent a lumbar puncture on 03/21/2016. 4 oligoclonal bands present in the CSF which were not present in the serum, consistent with multiple sclerosis. The IgG index was high normal at 0.6.   There were only 4 white blood cells but 280 red blood cells more consistent with a slightly traumatic tap.    [facility] Spotted Fever CSF IgG was negative but the IgM was positive at 1.41 (less than 0.9 normal).    ANA and ANCA were both negative.    REVIEW OF SYSTEMS: Constitutional: No fevers, chills, sweats, or change in appetite.   She has fatigue. Insomnia much better with temazepam Eyes: No visual changes, double vision, eye pain Ear, nose and throat: No hearing loss, ear pain, nasal congestion, sore throat Cardiovascular: No chest pain, palpitations Respiratory: No shortness of breath at rest or with exertion.   No wheezes GastrointestinaI: No nausea, vomiting, diarrhea, abdominal pain, fecal incontinence Genitourinary: No dysuria, urinary retention.   She reports frequency.  No nocturia. Musculoskeletal: No neck pain, back pain Integumentary: No rash, pruritus, skin lesions Neurological: as above Psychiatric: No  depression at this time.  No anxiety Endocrine: No palpitations, diaphoresis, change in appetite, change in weigh or increased thirst Hematologic/Lymphatic: No anemia, purpura, petechiae. Allergic/Immunologic: No itchy/runny eyes, nasal congestion, recent allergic reactions, rashes  ALLERGIES: Allergies  Allergen Reactions  . Codeine Other (See Comments)    Hallucinations     HOME MEDICATIONS:  Current Outpatient Medications:  .  baclofen (LIORESAL) 10 MG tablet, Take 1 tablet (10 mg total) 3 (three) times daily by mouth., Disp: 90 tablet, Rfl: 11 .  gabapentin (NEURONTIN) 600 MG tablet, Take 1 tablet (600 mg total) by mouth 4 (four) times daily., Disp: 360 tablet, Rfl: 1 .  imipramine (TOFRANIL) 25 MG tablet, Take 2 tablets (50 mg total) by mouth at bedtime., Disp: 180 tablet, Rfl: 1 .  meloxicam (MOBIC) 15 MG tablet, TAKE 1 TABLET BY MOUTH EVERY DAY, Disp: 30 tablet, Rfl: 5 .  metoCLOPramide (REGLAN) 5 MG tablet, Take 1 tablet (5 mg total) by mouth every 8 (eight) hours as needed for nausea., Disp: 90 tablet, Rfl: 1 .  naproxen sodium (ANAPROX) 220 MG tablet, Take 220 mg by mouth 2 (two) times daily as needed (for pain)., Disp: , Rfl:  .  natalizumab (TYSABRI) 300 MG/15ML injection, Inject 300 mg into the vein every 28 (twenty-eight) days., Disp: , Rfl:  .  temazepam (RESTORIL) 30 MG capsule, Take 1 capsule (30 mg total) by mouth at bedtime as needed for  sleep., Disp: 30 capsule, Rfl: 5 .  Vitamin D, Ergocalciferol, (DRISDOL) 50000 units CAPS capsule, Take 1 capsule (50,000 Units total) by mouth every 7 (seven) days., Disp: 12 capsule, Rfl: 3 .  HYDROcodone-acetaminophen (NORCO) 5-325 MG tablet, Take 1 tablet by mouth every 6 (six) hours as needed for moderate pain., Disp: 30 tablet, Rfl: 0 .  methylPREDNISolone (MEDROL DOSEPAK) 4 MG TBPK tablet, Take 6 po for 1 day, 5 by mouth for 1 day, 4 po for 1 day, 3 po for 1 day, 2 po for 1 day, one by mouth for 1 day., Disp: 21 tablet, Rfl: 0 .   predniSONE (DELTASONE) 5 MG tablet, Begin taking 6 tablets daily, taper by one tablet daily until off the medication., Disp: 21 tablet, Rfl: 0 .  rOPINIRole (REQUIP) 0.5 MG tablet, Take 1-2 tablets daily at bedtime., Disp: 60 tablet, Rfl: 5  PAST MEDICAL HISTORY: Past Medical History:  Diagnosis Date  . Anxiety   . Headache   . Multiple sclerosis (HCC)   . Nystagmus 02/15/2017  . Syncope and collapse     PAST SURGICAL HISTORY: History reviewed. No pertinent surgical history.  FAMILY HISTORY: Family History  Problem Relation Age of Onset  . Emphysema Mother   . Osteoarthritis Mother   . Other Father   . ALS Maternal Grandmother     SOCIAL HISTORY:  Social History   Socioeconomic History  . Marital status: Married    Spouse name: Not on file  . Number of children: Not on file  . Years of education: Not on file  . Highest education level: Not on file  Social Needs  . Financial resource strain: Not on file  . Food insecurity - worry: Not on file  . Food insecurity - inability: Not on file  . Transportation needs - medical: Not on file  . Transportation needs - non-medical: Not on file  Occupational History  . Not on file  Tobacco Use  . Smoking status: Current Every Day Smoker    Types: E-cigarettes  . Smokeless tobacco: Never Used  Substance and Sexual Activity  . Alcohol use: Yes    Alcohol/week: 0.0 oz    Comment: rarely  . Drug use: No  . Sexual activity: Not on file  Other Topics Concern  . Not on file  Social History Narrative  . Not on file     PHYSICAL EXAM  Vitals:   01/01/18 1509  BP: (!) 151/83  Pulse: (!) 124  Resp: 18  Weight: 200 lb (90.7 kg)  Height: 5\' 4"  (1.626 m)    Body mass index is 34.33 kg/m.   General: The patient is well-developed and well-nourished and in no acute distress,.  Funduscopic examination shows normal optic discs and retinal vessels.  Musculoskeletal: He has tenderness over the lower lumbar paraspinal  muscles..   Mild tenderness over the trochanteric bursae   Neurologic Exam  Mental status: The patient is alert and oriented x 3 at the time of the examination. The patient has apparent normal recent and remote memory, with an apparently normal attention span and concentration ability.   Speech is normal.  Cranial nerves: Extraocular muscles are intact.  Facial strength and sensation is normal. Trapezius strength is normal. No obvious hearing deficits are noted.  Motor:  Muscle bulk is normal.  Strength is 5/5. Tone is mildly increased in the legs.  Sensory: She has reduced sensation to touch and normal vibration sensation legs.   Coordination: Cerebellar testing reveals good  finger-nose-finger and heel-to-shin bilaterally.  Gait and station: Station is normal.   Gait is normal. Tandem gait is mildly wide. Romberg is negative.   Reflexes: Deep tendon reflexes are symmetric and normal in arms.  DTRs are brisk at knees but no spread and no ankle clonus       DIAGNOSTIC DATA (LABS, IMAGING, TESTING) - I reviewed patient records, labs, notes, testing and imaging myself where available.  Lab Results  Component Value Date   WBC 6.6 04/25/2017   HGB 13.7 04/25/2017   HCT 40.7 04/25/2017   MCV 93 04/25/2017   PLT 289 04/25/2017      Component Value Date/Time   NA 137 03/19/2016 1929   K 3.7 03/19/2016 1929   CL 110 03/19/2016 1929   CO2 18 (L) 03/19/2016 1929   GLUCOSE 100 (H) 03/19/2016 1929   BUN 16 03/19/2016 1929   CREATININE 0.74 03/19/2016 1929   CALCIUM 8.9 03/19/2016 1929   PROT 6.1 12/04/2016 1131   ALBUMIN 3.7 12/04/2016 1131   AST 11 12/04/2016 1131   ALT 13 12/04/2016 1131   ALKPHOS 73 12/04/2016 1131   BILITOT <0.2 12/04/2016 1131   GFRNONAA >60 03/19/2016 1929   GFRAA >60 03/19/2016 1929    Lab Results  Component Value Date   TSH 1.400 04/25/2017       ASSESSMENT AND PLAN  Multiple sclerosis (HCC) - Plan: Stratify JCV Antibody Test ([facility]), CBC with  Differential/Platelet, MR BRAIN W WO CONTRAST, TSH, T4  Bilateral sciatica  High risk medication use  Other fatigue - Plan: TSH, T4  Ataxic gait  Insomnia, unspecified type  Neck pain  Dizziness - Plan: HOLTER MONITOR - 48 HOUR  Palpitations - Plan: HOLTER MONITOR - 48 HOUR  Essential tremor - Plan: TSH, T4   1.    She will continue Tysabri  300 mg every 4 weeks.   Check JCV antibody, CBC.    2.    Also check thyroid for tremor, consider a beta blocker if this worsens. 3.    Will be seeing pain management for her lower back issues..   Try to stay active and exercise as tolerated.  4.    Renew temazepam for insomnia.  5.    48 hour Holter monitor to workup the palpitations and tachycardia  6.    Return 4 months.  Call sooner if there are new or worsening neurologic symptoms.  Richard A. Epimenio Foot, MD, PhD 01/01/2018, 4:57 PM Certified in Neurology, Clinical Neurophysiology, Sleep Medicine, Pain Medicine and Neuroimaging  [facility] [address], [address] [place], [place] [address] [phone]

## 2018-01-02 LAB — CBC WITH DIFFERENTIAL/PLATELET
BASOS: 1 %
Basophils Absolute: 0.1 10*3/uL (ref 0.0–0.2)
EOS (ABSOLUTE): 0.3 10*3/uL (ref 0.0–0.4)
Eos: 4 %
Hematocrit: 38.5 % (ref 34.0–46.6)
Hemoglobin: 13 g/dL (ref 11.1–15.9)
IMMATURE GRANULOCYTES: 0 %
Immature Grans (Abs): 0 10*3/uL (ref 0.0–0.1)
LYMPHS ABS: 3.2 10*3/uL — AB (ref 0.7–3.1)
Lymphs: 37 %
MCH: 31.6 pg (ref 26.6–33.0)
MCHC: 33.8 g/dL (ref 31.5–35.7)
MCV: 93 fL (ref 79–97)
MONOS ABS: 0.8 10*3/uL (ref 0.1–0.9)
Monocytes: 9 %
NEUTROS PCT: 49 %
Neutrophils Absolute: 4.3 10*3/uL (ref 1.4–7.0)
PLATELETS: 337 10*3/uL (ref 150–379)
RBC: 4.12 x10E6/uL (ref 3.77–5.28)
RDW: 13.5 % (ref 12.3–15.4)
WBC: 8.6 10*3/uL (ref 3.4–10.8)

## 2018-01-02 LAB — TSH: TSH: 1.01 u[IU]/mL (ref 0.450–4.500)

## 2018-01-02 LAB — T4: T4 TOTAL: 7.2 ug/dL (ref 4.5–12.0)

## 2018-01-07 ENCOUNTER — Telehealth: Payer: Self-pay | Admitting: Neurology

## 2018-01-07 ENCOUNTER — Encounter: Payer: Self-pay | Admitting: Neurology

## 2018-01-07 DIAGNOSIS — M5440 Lumbago with sciatica, unspecified side: Secondary | ICD-10-CM

## 2018-01-07 NOTE — Telephone Encounter (Signed)
Due to continued back and leg pain, we will check an MRI of the lumbar spine to determine if  Nerve root compression or significant foraminal narrowing. If present, consider epidural steroid injection or referral to surgery based on severity.

## 2018-01-08 ENCOUNTER — Telehealth: Payer: Self-pay | Admitting: Neurology

## 2018-01-08 NOTE — Telephone Encounter (Signed)
01/08/18 [employer] order sent to [facility]

## 2018-01-10 ENCOUNTER — Other Ambulatory Visit: Payer: Managed Care, Other (non HMO)

## 2018-01-10 ENCOUNTER — Ambulatory Visit
Admission: RE | Admit: 2018-01-10 | Discharge: 2018-01-10 | Disposition: A | Discharge status: Home / Self Care | Payer: Managed Care, Other (non HMO) | Source: Ambulatory Visit | Attending: Neurology | Admitting: Neurology

## 2018-01-10 DIAGNOSIS — G35 Multiple sclerosis: Secondary | ICD-10-CM

## 2018-01-10 MED ORDER — GADOBENATE DIMEGLUMINE 529 MG/ML IV SOLN
19.0000 mL | Freq: Once | INTRAVENOUS | Status: AC | PRN
  Administered 2018-01-10: 19 mL via INTRAVENOUS

## 2018-01-11 ENCOUNTER — Telehealth: Payer: Self-pay | Admitting: *Deleted

## 2018-01-11 NOTE — Telephone Encounter (Addendum)
From Dr. Epimenio Foot: Please let her know that the MRI of the brain looks much better than the one she had last year. There are no new lesions in the ones that were more swollen last year have shrunk.    Left patient a detailed message, with results, on voicemail (ok per DPR).  Provided our number to call back with any questions.

## 2018-01-14 ENCOUNTER — Inpatient Hospital Stay: Admission: RE | Admit: 2018-01-14 | Payer: Managed Care, Other (non HMO) | Source: Ambulatory Visit

## 2018-01-16 ENCOUNTER — Other Ambulatory Visit: Payer: Self-pay | Admitting: Neurology

## 2018-01-16 ENCOUNTER — Telehealth: Payer: Self-pay | Admitting: Neurology

## 2018-01-16 DIAGNOSIS — G35 Multiple sclerosis: Secondary | ICD-10-CM

## 2018-01-16 NOTE — Telephone Encounter (Signed)
The MRI lumbar is pending. I faxed the results of the last MRI Lumbar that she had in October 2018.. I called [employer] to check the status on the case and they said the turn around time will be tomorrow.

## 2018-01-17 NOTE — Telephone Encounter (Signed)
Noted, thank you

## 2018-01-17 NOTE — Telephone Encounter (Signed)
Spoke with [employer] RNand provided more information--As is clearly noted on the MRI order, 2nd MRI L-spine was ordered b'c of a new injury--pt. reported a fall 2 wks. prior, and worsening bilat. sciatica to knees, cramping in both calves since the fall.  She has been referred to pain mx. for chronic pain in legs, but need to know if the worsening of pain is due to an injury sustained in her fall. Gabapentin, NSAIDs for pain; she declined oral steroids as she does not like the way they make her feel.  [employer] RN sts. she will forward this info to the physician reviewing this case; should hear something w/i 2 business days/fim

## 2018-01-17 NOTE — Telephone Encounter (Signed)
I called [employer] to check the status of the reconsideration and they informed me that it has been denied. If you would like to do a peer to peer the phone number is [phone] option 4. The case number is [id number].

## 2018-01-21 NOTE — Addendum Note (Signed)
Addended by: Tamera Stands D on: 01/21/2018 03:21 PM   Modules accepted: Orders

## 2018-01-21 NOTE — Telephone Encounter (Signed)
Rutherford Nail: [id number] (exp. 01/10/18 to 04/10/18).

## 2018-01-22 LAB — CBC WITH DIFFERENTIAL/PLATELET
BASOS: 1 %
Basophils Absolute: 0.1 10*3/uL (ref 0.0–0.2)
EOS (ABSOLUTE): 0.4 10*3/uL (ref 0.0–0.4)
Eos: 5 %
HEMOGLOBIN: 13 g/dL (ref 11.1–15.9)
Hematocrit: 38.8 % (ref 34.0–46.6)
IMMATURE GRANS (ABS): 0 10*3/uL (ref 0.0–0.1)
Immature Granulocytes: 0 %
LYMPHS: 40 %
Lymphocytes Absolute: 3.2 10*3/uL — ABNORMAL HIGH (ref 0.7–3.1)
MCH: 31.5 pg (ref 26.6–33.0)
MCHC: 33.5 g/dL (ref 31.5–35.7)
MCV: 94 fL (ref 79–97)
MONOCYTES: 8 %
Monocytes Absolute: 0.6 10*3/uL (ref 0.1–0.9)
NEUTROS ABS: 3.8 10*3/uL (ref 1.4–7.0)
Neutrophils: 46 %
Platelets: 336 10*3/uL (ref 150–379)
RBC: 4.13 x10E6/uL (ref 3.77–5.28)
RDW: 14.3 % (ref 12.3–15.4)
WBC: 8.1 10*3/uL (ref 3.4–10.8)

## 2018-01-23 ENCOUNTER — Encounter: Payer: Self-pay | Admitting: Neurology

## 2018-01-24 ENCOUNTER — Encounter: Payer: Self-pay | Admitting: *Deleted

## 2018-01-27 ENCOUNTER — Ambulatory Visit
Admission: RE | Admit: 2018-01-27 | Discharge: 2018-01-27 | Disposition: A | Discharge status: Home / Self Care | Payer: Managed Care, Other (non HMO) | Source: Ambulatory Visit | Attending: Neurology | Admitting: Neurology

## 2018-01-27 DIAGNOSIS — M5440 Lumbago with sciatica, unspecified side: Secondary | ICD-10-CM | POA: Diagnosis not present

## 2018-01-28 ENCOUNTER — Encounter: Payer: Self-pay | Admitting: Family Medicine

## 2018-01-28 ENCOUNTER — Encounter: Payer: Self-pay | Admitting: *Deleted

## 2018-01-28 ENCOUNTER — Ambulatory Visit (INDEPENDENT_AMBULATORY_CARE_PROVIDER_SITE_OTHER): Payer: Managed Care, Other (non HMO) | Admitting: Family Medicine

## 2018-01-28 VITALS — BP 112/80 | HR 104 | Temp 98.0°F | Resp 14 | Ht 64.0 in | Wt 202.0 lb

## 2018-01-28 DIAGNOSIS — R002 Palpitations: Secondary | ICD-10-CM

## 2018-01-28 DIAGNOSIS — Z23 Encounter for immunization: Secondary | ICD-10-CM | POA: Diagnosis not present

## 2018-01-28 DIAGNOSIS — Z7689 Persons encountering health services in other specified circumstances: Secondary | ICD-10-CM | POA: Diagnosis not present

## 2018-01-28 DIAGNOSIS — R55 Syncope and collapse: Secondary | ICD-10-CM | POA: Diagnosis not present

## 2018-01-28 NOTE — Progress Notes (Signed)
Subjective:    Patient ID: Virginia [name], female    DOB: 01/01/1981, 37 y.o.   MRN: [id number]  HPI Patient is a very pleasant 37 year old white female here today to establish care.  Past medical history is significant for multiple sclerosis diagnosed approximately 2 years ago under the care of Dr. Epimenio Foot (neurology).  She however has been having symptoms recently unrelated to her MS and wanted to establish with a primary care physician.  Biggest concern is racing heartbeat.  She states at times, her heart will race and pound out of her chest for no reason.  During these episodes, she will become extremely lightheaded.  At times she is even passed out.  Passing out is infrequent.  They occur almost daily.  She feels extremely anxious when they happen.  She denies any chest pain or shortness of breath or dyspnea on exertion.  She does believe that anxiety could be playing a role in the.  Depression runs in her mother and her brother.  Her mother has a history of panic attacks.  Recently labs were obtained her neurology office that included a normal CBC, normal TSH, and a normal T4.  EKG today is performed in the office which shows normal sinus rhythm with normal intervals and a normal axis with normal QTc interval and no evidence of Wolff-Parkinson-White or Brugada. Past Medical History:  Diagnosis Date  . Anxiety   . Headache   . Insomnia   . Multiple sclerosis (HCC)   . Nystagmus 02/15/2017  . Syncope and collapse    No past surgical history on file. Current Outpatient Medications on File Prior to Visit  Medication Sig Dispense Refill  . baclofen (LIORESAL) 10 MG tablet Take 1 tablet (10 mg total) 3 (three) times daily by mouth. 90 tablet 11  . gabapentin (NEURONTIN) 600 MG tablet Take 1 tablet (600 mg total) by mouth 4 (four) times daily. 360 tablet 1  . imipramine (TOFRANIL) 25 MG tablet Take 2 tablets (50 mg total) by mouth at bedtime. 180 tablet 1  . meloxicam (MOBIC) 15 MG tablet TAKE 1  TABLET BY MOUTH EVERY DAY 30 tablet 5  . natalizumab (TYSABRI) 300 MG/15ML injection Inject 300 mg into the vein every 28 (twenty-eight) days.    . temazepam (RESTORIL) 30 MG capsule Take 1 capsule (30 mg total) by mouth at bedtime as needed for sleep. 30 capsule 5   No current facility-administered medications on file prior to visit.    Allergies  Allergen Reactions  . Codeine Other (See Comments)    Hallucinations    Social History   Socioeconomic History  . Marital status: Married    Spouse name: Not on file  . Number of children: Not on file  . Years of education: Not on file  . Highest education level: Not on file  Occupational History  . Not on file  Social Needs  . Financial resource strain: Not on file  . Food insecurity:    Worry: Not on file    Inability: Not on file  . Transportation needs:    Medical: Not on file    Non-medical: Not on file  Tobacco Use  . Smoking status: Current Every Day Smoker    Types: E-cigarettes  . Smokeless tobacco: Never Used  Substance and Sexual Activity  . Alcohol use: Yes    Alcohol/week: 0.0 oz    Comment: rarely  . Drug use: No  . Sexual activity: Not on file  Lifestyle  .  Physical activity:    Days per week: Not on file    Minutes per session: Not on file  . Stress: Not on file  Relationships  . Social connections:    Talks on phone: Not on file    Gets together: Not on file    Attends religious service: Not on file    Active member of club or organization: Not on file    Attends meetings of clubs or organizations: Not on file    Relationship status: Not on file  . Intimate partner violence:    Fear of current or ex partner: Not on file    Emotionally abused: Not on file    Physically abused: Not on file    Forced sexual activity: Not on file  Other Topics Concern  . Not on file  Social History Narrative  . Not on file   Family History  Problem Relation Age of Onset  . Emphysema Mother   . Osteoarthritis  Mother   . Other Father   . ALS Maternal Grandmother       Review of Systems  All other systems reviewed and are negative.      Objective:   Physical Exam  Constitutional: She is oriented to person, place, and time. She appears well-developed and well-nourished. No distress.  HENT:  Mouth/Throat: Oropharynx is clear and moist.  Eyes: Pupils are equal, round, and reactive to light. Conjunctivae and EOM are normal.  Neck: Neck supple. No JVD present. No thyromegaly present.  Cardiovascular: Normal rate, regular rhythm, normal heart sounds and intact distal pulses. Exam reveals no gallop and no friction rub.  No murmur heard. Pulmonary/Chest: Effort normal and breath sounds normal. No respiratory distress. She has no wheezes. She has no rales. She exhibits no tenderness.  Abdominal: Soft. Bowel sounds are normal. She exhibits no distension and no mass. There is no tenderness. There is no rebound and no guarding.  Musculoskeletal: She exhibits no edema.  Lymphadenopathy:    She has no cervical adenopathy.  Neurological: She is alert and oriented to person, place, and time. She has normal reflexes. No cranial nerve deficit. She exhibits normal muscle tone. Coordination normal.  Skin: Skin is warm. No rash noted. She is not diaphoretic. No erythema. No pallor.  Psychiatric: She has a normal mood and affect. Her behavior is normal. Judgment and thought content normal.  Vitals reviewed.         Assessment & Plan:  Palpitations - Plan: EKG 12-Lead  Need for Tdap vaccination - Plan: Tdap vaccine greater than or equal to 7yo IM  Syncope, near  Encounter to establish care with new doctor  Pap smear was deferred today to focus her time on her chief concern which was the syncope and palpitations.  I will schedule the patient for Holter monitor.  I will schedule her for an echocardiogram.  EKG today was reassuring.  CMP, CBC, and TSH were obtained earlier March were normal.  These are  reviewed with the patient.  I anticipate that the majority this could be anxiety related and panic attacks.  If Holter monitor and echocardiogram are normal, we will discuss treatment options for anxiety and panic attacks.

## 2018-01-28 NOTE — Telephone Encounter (Signed)
-----   Message from Asa Lente, MD sent at 01/28/2018  9:25 AM EDT ----- Please let him know that the MRI of the lumbar spine shows the disc protrusion at L5-S1 looks about the same as the last MRI.  The disc protrusion seems to contact the S1 nerve roots but does not definitely compress them.  Since she has not received benefit from epidural steroid injection, we could refer her to neurosurgery.

## 2018-01-29 ENCOUNTER — Telehealth: Payer: Self-pay | Admitting: Family Medicine

## 2018-01-29 ENCOUNTER — Encounter: Payer: Self-pay | Admitting: *Deleted

## 2018-01-29 NOTE — Telephone Encounter (Signed)
-----   Message from [name], MD sent at 01/28/2018  1:52 PM EDT ----- Needs Holter monitor

## 2018-01-29 NOTE — Telephone Encounter (Signed)
[facility] called and monitor ordered

## 2018-02-01 NOTE — Telephone Encounter (Signed)
Apt set up for pt

## 2018-02-01 NOTE — Telephone Encounter (Signed)
Received holter monitor - pt called and made aware to schedule apt to have put on via vm on cell #

## 2018-02-04 ENCOUNTER — Ambulatory Visit: Payer: Managed Care, Other (non HMO)

## 2018-02-04 DIAGNOSIS — R42 Dizziness and giddiness: Secondary | ICD-10-CM

## 2018-02-04 DIAGNOSIS — R002 Palpitations: Secondary | ICD-10-CM

## 2018-02-04 NOTE — Progress Notes (Signed)
Holter Monitor placed. Informed patient to return tomorrow so monitor may be removed. Patient verbalized understanding.

## 2018-02-04 NOTE — Telephone Encounter (Signed)
Monitor applied by Theodora Blow

## 2018-02-05 ENCOUNTER — Ambulatory Visit: Payer: Managed Care, Other (non HMO)

## 2018-02-05 DIAGNOSIS — R002 Palpitations: Secondary | ICD-10-CM

## 2018-02-05 NOTE — Progress Notes (Signed)
Patient was in office to have her holter monitor removed. Because it is so late in the day [employer] could not come until after 5pm today.They will pick up on 4/10 p/u confirmation# [id number]

## 2018-02-06 ENCOUNTER — Inpatient Hospital Stay (HOSPITAL_COMMUNITY): Admission: RE | Admit: 2018-02-06 | Payer: Managed Care, Other (non HMO) | Source: Ambulatory Visit

## 2018-02-11 ENCOUNTER — Encounter (INDEPENDENT_AMBULATORY_CARE_PROVIDER_SITE_OTHER): Payer: Self-pay

## 2018-02-18 ENCOUNTER — Encounter (INDEPENDENT_AMBULATORY_CARE_PROVIDER_SITE_OTHER): Payer: Self-pay

## 2018-02-18 ENCOUNTER — Ambulatory Visit: Payer: Managed Care, Other (non HMO)

## 2018-02-18 DIAGNOSIS — R002 Palpitations: Secondary | ICD-10-CM

## 2018-02-18 NOTE — Progress Notes (Signed)
Patient came in to have holter monitor placed. Holter monitor placed and patient instructed not to shower and to return in 24 hours for monitor to be removed. Patient verbalized understanding.

## 2018-02-19 ENCOUNTER — Ambulatory Visit (INDEPENDENT_AMBULATORY_CARE_PROVIDER_SITE_OTHER): Payer: Managed Care, Other (non HMO) | Admitting: *Deleted

## 2018-02-19 DIAGNOSIS — R002 Palpitations: Secondary | ICD-10-CM | POA: Diagnosis not present

## 2018-02-19 NOTE — Progress Notes (Signed)
Patient in office to have Holter Monitor removed.   Call placed to [employer] for pickup.

## 2018-02-28 ENCOUNTER — Ambulatory Visit (INDEPENDENT_AMBULATORY_CARE_PROVIDER_SITE_OTHER): Payer: Managed Care, Other (non HMO) | Admitting: Cardiovascular Disease

## 2018-02-28 ENCOUNTER — Encounter: Payer: Self-pay | Admitting: Cardiovascular Disease

## 2018-02-28 ENCOUNTER — Other Ambulatory Visit: Payer: Self-pay | Admitting: Family Medicine

## 2018-02-28 VITALS — BP 114/83 | HR 104 | Ht 64.0 in | Wt 197.4 lb

## 2018-02-28 DIAGNOSIS — I472 Ventricular tachycardia: Secondary | ICD-10-CM | POA: Diagnosis not present

## 2018-02-28 DIAGNOSIS — I4729 Other ventricular tachycardia: Secondary | ICD-10-CM

## 2018-02-28 DIAGNOSIS — I429 Cardiomyopathy, unspecified: Secondary | ICD-10-CM

## 2018-02-28 DIAGNOSIS — R002 Palpitations: Secondary | ICD-10-CM | POA: Diagnosis not present

## 2018-02-28 DIAGNOSIS — I4721 Torsades de pointes: Secondary | ICD-10-CM

## 2018-02-28 NOTE — Patient Instructions (Addendum)
Medication Instructions: Your physician recommends that you continue on your current medications as directed. Please refer to the Current Medication list given to you today.   Testing/Procedures: Your physician has requested that you have an echocardiogram (STAT--needs tomorrow). Echocardiography is a painless test that uses sound waves to create images of your heart. It provides your doctor with information about the size and shape of your heart and how well your heart's chambers and valves are working. This procedure takes approximately one hour. There are no restrictions for this procedure.  Your physician has requested that you have a cardiac MRI. Cardiac MRI uses a computer to create images of your heart as its beating, producing both still and moving pictures of your heart and major blood vessels. For further information please visit [email or web address]. Please follow the instruction sheet given to you today for more information.  Follow-Up: You have been referred to Cardiac Electrophysiology---need appt in 2 days.  Your physician recommends that you schedule a follow-up appointment in: 3 months with Dr. Allyson Sabal.

## 2018-02-28 NOTE — Progress Notes (Signed)
02/28/2018 Mickel Fuchs   1981/10/01  [id number]  Primary Physician [name], MD Primary Cardiologist: Runell Gess MD Nicholes Calamity, [place]  HPI:  Virginia [name] is a 37 y.o. moderately overweight married Caucasian female mother of one child who currently does not work. She was referred by Dr. Tanya Nones for cardiac stress evaluation because of tachycardia palpitations and episodes of "altered mental status". She has no cardiac risk factors. She's never had a stroke. There is no family history for heart disease or sudden cardiac death. She denies chest pain or shortness of breath. She does have multiple sclerosis. She's had tachycardia palpitations for the last several months with episodes of altered mental status. A recent Holter monitor shows what appears to be torsades de pointes. Her 12-lead EKG does not show QT prolongation.    Current Meds  Medication Sig  . baclofen (LIORESAL) 10 MG tablet Take 1 tablet (10 mg total) 3 (three) times daily by mouth.  . gabapentin (NEURONTIN) 600 MG tablet Take 1 tablet (600 mg total) by mouth 4 (four) times daily.  Marland Kitchen imipramine (TOFRANIL) 25 MG tablet Take 2 tablets (50 mg total) by mouth at bedtime.  . meloxicam (MOBIC) 15 MG tablet TAKE 1 TABLET BY MOUTH EVERY DAY  . natalizumab (TYSABRI) 300 MG/15ML injection Inject 300 mg into the vein every 28 (twenty-eight) days.  . temazepam (RESTORIL) 30 MG capsule Take 1 capsule (30 mg total) by mouth at bedtime as needed for sleep.     Allergies  Allergen Reactions  . Codeine Other (See Comments)    Hallucinations     Social History   Socioeconomic History  . Marital status: Married    Spouse name: Not on file  . Number of children: Not on file  . Years of education: Not on file  . Highest education level: Not on file  Occupational History  . Not on file  Social Needs  . Financial resource strain: Not on file  . Food insecurity:    Worry: Not on file    Inability: Not on file    . Transportation needs:    Medical: Not on file    Non-medical: Not on file  Tobacco Use  . Smoking status: Current Every Day Smoker    Types: E-cigarettes  . Smokeless tobacco: Never Used  Substance and Sexual Activity  . Alcohol use: Yes    Alcohol/week: 0.0 oz    Comment: rarely  . Drug use: No  . Sexual activity: Not on file  Lifestyle  . Physical activity:    Days per week: Not on file    Minutes per session: Not on file  . Stress: Not on file  Relationships  . Social connections:    Talks on phone: Not on file    Gets together: Not on file    Attends religious service: Not on file    Active member of club or organization: Not on file    Attends meetings of clubs or organizations: Not on file    Relationship status: Not on file  . Intimate partner violence:    Fear of current or ex partner: Not on file    Emotionally abused: Not on file    Physically abused: Not on file    Forced sexual activity: Not on file  Other Topics Concern  . Not on file  Social History Narrative  . Not on file     Review of Systems: General: negative for chills, fever,  night sweats or weight changes.  Cardiovascular: negative for chest pain, dyspnea on exertion, edema, orthopnea, palpitations, paroxysmal nocturnal dyspnea or shortness of breath Dermatological: negative for rash Respiratory: negative for cough or wheezing Urologic: negative for hematuria Abdominal: negative for nausea, vomiting, diarrhea, bright red blood per rectum, melena, or hematemesis Neurologic: negative for visual changes, syncope, or dizziness All other systems reviewed and are otherwise negative except as noted above.    Blood pressure 114/83, pulse (!) 104, height 5\' 4"  (1.626 m), weight 197 lb 6.4 oz (89.5 kg), SpO2 99 %.  General appearance: alert and no distress Neck: no adenopathy, no carotid bruit, no JVD, supple, symmetrical, trachea midline and thyroid not enlarged, symmetric, no  tenderness/mass/nodules Lungs: clear to auscultation bilaterally Heart: regular rate and rhythm, S1, S2 normal, no murmur, click, rub or gallop Extremities: extremities normal, atraumatic, no cyanosis or edema Pulses: 2+ and symmetric Skin: Skin color, texture, turgor normal. No rashes or lesions Neurologic: Alert and oriented X 3, normal strength and tone. Normal symmetric reflexes. Normal coordination and gait  EKG not performed today  ASSESSMENT AND PLAN:   Palpitations Ms. Bostic was referred to me by Dr. Tanya Nones for evaluation of palpitations and episodes of "altered mental status". A Holter monitor shows what appears to be torsade de  Point. There is no family history of sudden cardiac death. There is no QTC prolongation. I'm going to get a 2-D echo to the telemetry function. She may need a cardiac MRI to look for infiltrative disease and/or Brugada syndrome. I'm going to refer her toEP for further evaluation.      Runell Gess MD FACP,FACC,FAHA, [facility] 02/28/2018 1:59 PM

## 2018-02-28 NOTE — Assessment & Plan Note (Signed)
Virginia [name] was referred to me by Dr. Tanya Nones for evaluation of palpitations and episodes of "altered mental status". A Holter monitor shows what appears to be torsade de  Point. There is no family history of sudden cardiac death. There is no QTC prolongation. I'm going to get a 2-D echo to the telemetry function. She may need a cardiac MRI to look for infiltrative disease and/or Brugada syndrome. I'm going to refer her toEP for further evaluation.

## 2018-03-01 ENCOUNTER — Encounter: Payer: Self-pay | Admitting: Neurology

## 2018-03-01 ENCOUNTER — Ambulatory Visit (INDEPENDENT_AMBULATORY_CARE_PROVIDER_SITE_OTHER): Payer: Managed Care, Other (non HMO) | Admitting: Internal Medicine

## 2018-03-01 ENCOUNTER — Ambulatory Visit (HOSPITAL_COMMUNITY): Payer: Managed Care, Other (non HMO) | Attending: Cardiology

## 2018-03-01 ENCOUNTER — Other Ambulatory Visit: Payer: Self-pay

## 2018-03-01 ENCOUNTER — Encounter: Payer: Self-pay | Admitting: Internal Medicine

## 2018-03-01 VITALS — BP 126/84 | HR 87 | Ht 64.0 in | Wt 199.0 lb

## 2018-03-01 DIAGNOSIS — R002 Palpitations: Secondary | ICD-10-CM

## 2018-03-01 DIAGNOSIS — I472 Ventricular tachycardia: Secondary | ICD-10-CM | POA: Diagnosis present

## 2018-03-01 DIAGNOSIS — G35 Multiple sclerosis: Secondary | ICD-10-CM | POA: Insufficient documentation

## 2018-03-01 DIAGNOSIS — R4182 Altered mental status, unspecified: Secondary | ICD-10-CM | POA: Insufficient documentation

## 2018-03-01 DIAGNOSIS — I4721 Torsades de pointes: Secondary | ICD-10-CM

## 2018-03-01 LAB — ECHOCARDIOGRAM COMPLETE
Height: 64 in
Weight: 3184 oz

## 2018-03-01 MED ORDER — METOPROLOL SUCCINATE ER 25 MG PO TB24: ORAL_TABLET | ORAL | 3 refills | Status: DC

## 2018-03-01 NOTE — Progress Notes (Signed)
HPI Mrs. Tonnesen is referred today for evaluation of a wide QRS tachycardia. She has a h/o MS diagnosed by symptoms and MRI. She has a 2-3 month h/o palpitations which occur for seconds up to a minute essentially every day. She gets dizzy and lightheaded. She has not passed out. She wore a cardiac monitor which demonstrated NSR with NSVT at 190/min. She is pending a 2D echo and MRI. Allergies  Allergen Reactions  . Codeine Other (See Comments)    Hallucinations      Current Outpatient Medications  Medication Sig Dispense Refill  . baclofen (LIORESAL) 10 MG tablet Take 1 tablet (10 mg total) 3 (three) times daily by mouth. 90 tablet 11  . gabapentin (NEURONTIN) 600 MG tablet Take 1 tablet (600 mg total) by mouth 4 (four) times daily. 360 tablet 1  . imipramine (TOFRANIL) 25 MG tablet Take 2 tablets (50 mg total) by mouth at bedtime. 180 tablet 1  . meloxicam (MOBIC) 15 MG tablet TAKE 1 TABLET BY MOUTH EVERY DAY 30 tablet 5  . natalizumab (TYSABRI) 300 MG/15ML injection Inject 300 mg into the vein every 28 (twenty-eight) days.    . temazepam (RESTORIL) 30 MG capsule Take 1 capsule (30 mg total) by mouth at bedtime as needed for sleep. 30 capsule 5  . metoprolol succinate (TOPROL XL) 25 MG 24 hr tablet Take one tablet by mouth daily.  May take an additional tablet for breakthrough palpitations. 45 tablet 3   No current facility-administered medications for this visit.      Past Medical History:  Diagnosis Date  . Anxiety   . Headache   . Insomnia   . Multiple sclerosis (HCC)   . Nystagmus 02/15/2017  . Syncope and collapse     ROS:   All systems reviewed and negative except as noted in the HPI.   History reviewed. No pertinent surgical history.   Family History  Problem Relation Age of Onset  . Emphysema Mother   . Osteoarthritis Mother   . Other Father   . ALS Maternal Grandmother      Social History   Socioeconomic History  . Marital status: Married   Spouse name: Not on file  . Number of children: Not on file  . Years of education: Not on file  . Highest education level: Not on file  Occupational History  . Not on file  Social Needs  . Financial resource strain: Not on file  . Food insecurity:    Worry: Not on file    Inability: Not on file  . Transportation needs:    Medical: Not on file    Non-medical: Not on file  Tobacco Use  . Smoking status: Current Every Day Smoker    Types: E-cigarettes  . Smokeless tobacco: Never Used  Substance and Sexual Activity  . Alcohol use: Yes    Alcohol/week: 0.0 oz    Comment: rarely  . Drug use: No  . Sexual activity: Not on file  Lifestyle  . Physical activity:    Days per week: Not on file    Minutes per session: Not on file  . Stress: Not on file  Relationships  . Social connections:    Talks on phone: Not on file    Gets together: Not on file    Attends religious service: Not on file    Active member of club or organization: Not on file    Attends meetings of clubs or organizations: Not on  file    Relationship status: Not on file  . Intimate partner violence:    Fear of current or ex partner: Not on file    Emotionally abused: Not on file    Physically abused: Not on file    Forced sexual activity: Not on file  Other Topics Concern  . Not on file  Social History Narrative  . Not on file     BP 126/84   Pulse 87   Ht 5\' 4"  (1.626 m)   Wt 199 lb (90.3 kg)   SpO2 99%   BMI 34.16 kg/m   Physical Exam:  Well appearing young woman, NAD HEENT: Unremarkable Neck:  6 cm JVD, no thyromegally Lymphatics:  No adenopathy Back:  No CVA tenderness Lungs:  Clear with no wheezes HEART:  Regular rate rhythm, no murmurs, no rubs, no clicks Abd:  soft, positive bowel sounds, no organomegally, no rebound, no guarding Ext:  2 plus pulses, no edema, no cyanosis, no clubbing Skin:  No rashes no nodules Neuro:  CN II through XII intact, motor grossly intact  EKG NSR, without  QT prolongation  Assess/Plan: 1. Wide QRS tachy - the likely mechanism is RVOT VT. I do not think that this is TDP. She is pending cardiac MRI and the differential includes ARVC. I have recommended she start toprol. If her MRI shows evidence of structural heart problems then we will see back sooner.   Leonia Reeves.D.

## 2018-03-01 NOTE — Patient Instructions (Addendum)
Medication Instructions:  Your physician has recommended you make the following change in your medication:  1.  Toprol XL 25 mg one tablet by mouth daily.  Take an additional tablet as needed for breakthrough palpitations.    Labwork: None ordered.  Testing/Procedures: None ordered.  Follow-Up: Your physician wants you to follow-up in: 8 weeks with Dr. Ladona Ridgel.      Any Other Special Instructions Will Be Listed Below (If Applicable).  If you need a refill on your cardiac medications before your next appointment, please call your pharmacy.

## 2018-03-04 ENCOUNTER — Encounter: Payer: Self-pay | Admitting: Cardiovascular Disease

## 2018-03-06 ENCOUNTER — Encounter: Payer: Self-pay | Admitting: Family Medicine

## 2018-03-06 ENCOUNTER — Encounter: Payer: Self-pay | Admitting: Cardiovascular Disease

## 2018-03-06 ENCOUNTER — Other Ambulatory Visit: Payer: Self-pay

## 2018-03-06 ENCOUNTER — Encounter: Payer: Self-pay | Admitting: Neurology

## 2018-03-06 ENCOUNTER — Encounter: Payer: Self-pay | Admitting: Internal Medicine

## 2018-03-06 DIAGNOSIS — R002 Palpitations: Secondary | ICD-10-CM

## 2018-03-06 MED ORDER — ATENOLOL 25 MG PO TABS: 25.0000 mg | ORAL_TABLET | Freq: Every day | ORAL | 0 refills | Status: DC

## 2018-03-06 NOTE — Progress Notes (Signed)
Message received via MyChart. Pt not tolerating metoprolol. Spoke with Dr. Irish Lack Pt should stop metoprolol and start atenolol 25 mg one tablet by mouth daily.   Prescription sent to Pt pharmacy and Pt advised of medication change via MyChart.   Await further needs.

## 2018-03-07 ENCOUNTER — Encounter: Payer: Self-pay | Admitting: Family Medicine

## 2018-03-07 ENCOUNTER — Ambulatory Visit (INDEPENDENT_AMBULATORY_CARE_PROVIDER_SITE_OTHER): Payer: Managed Care, Other (non HMO) | Admitting: Family Medicine

## 2018-03-07 VITALS — BP 100/74 | HR 105 | Temp 98.1°F | Resp 18 | Ht 64.0 in | Wt 198.0 lb

## 2018-03-07 DIAGNOSIS — R404 Transient alteration of awareness: Secondary | ICD-10-CM | POA: Diagnosis not present

## 2018-03-07 DIAGNOSIS — I472 Ventricular tachycardia: Secondary | ICD-10-CM | POA: Diagnosis not present

## 2018-03-07 DIAGNOSIS — I4729 Other ventricular tachycardia: Secondary | ICD-10-CM

## 2018-03-07 DIAGNOSIS — R55 Syncope and collapse: Secondary | ICD-10-CM | POA: Diagnosis not present

## 2018-03-07 NOTE — Progress Notes (Signed)
Subjective:    Patient ID: Virginia [name], female    DOB: 1981-01-03, 37 y.o.   MRN: [id number]  HPI Patient is urgently worked in today based on a message she sent me this morning.  Apparently she is having episodes of amnestic behavior.  For instance she recently woke up in bed covered and potato chips.  She has no recollection of going to bed or eating potato chips.  She also had an instance where she was playing a game on her phone.  When she woke up, she was still holding her phone.  She was standing.  However she had a pill in her mouth.  She has no recollection of taking the medication.  Her son states that yesterday, she sat down with him to watch a show on television.  She remembers sitting down to watch TV.  After that she has no memory.  Son states that she had her eyes open was staring blankly.  She seemed extremely lethargic.  She was mumbling incoherently.  He was unable to wake her up and bring her around.  These behaviors are lasting minutes to an hour.  They are associated with a period of altered consciousness, amnesia, unusual behavior, and lethargy after the associated event.  Episodes sound atypical for syncope.  I do not believe this is cardiac in nature despite her recent episode of ventricular tachycardia.  Because episodes that are witnessed, the patient has her eyes open, she is lethargic, however she is still physically interacting with her environment as demonstrated by the potato chips, taking the medication that she has no recollection of, etc.  Episodes sound like overmedication versus atypical seizure (absent seizure).  EKG is performed today and shows a normal QTc interval.  Therefore I do not believe medications are prolonging her QTc interval causing repeated episodes of ventricular tachycardia.  I do not believe beta-blockers will have this effect on an individual.  Reviewing her medication list, she is on 50 mg of imipramine, she takes 10 mg of baclofen 3 times daily.  She  takes 600 mg of gabapentin 4 times a day, she is taking temazepam 30 mg a day.  She has been on all of these medications for a prolonged period of time and has habituated to them up to this point. Past Medical History:  Diagnosis Date  . Anxiety   . Headache   . Insomnia   . Multiple sclerosis (HCC)   . Nystagmus 02/15/2017  . Syncope and collapse    No past surgical history on file. Current Outpatient Medications on File Prior to Visit  Medication Sig Dispense Refill  . atenolol (TENORMIN) 25 MG tablet Take 1 tablet (25 mg total) by mouth daily. 14 tablet 0  . baclofen (LIORESAL) 10 MG tablet Take 1 tablet (10 mg total) 3 (three) times daily by mouth. 90 tablet 11  . gabapentin (NEURONTIN) 600 MG tablet Take 1 tablet (600 mg total) by mouth 4 (four) times daily. 360 tablet 1  . imipramine (TOFRANIL) 25 MG tablet Take 2 tablets (50 mg total) by mouth at bedtime. 180 tablet 1  . meloxicam (MOBIC) 15 MG tablet TAKE 1 TABLET BY MOUTH EVERY DAY 30 tablet 5  . natalizumab (TYSABRI) 300 MG/15ML injection Inject 300 mg into the vein every 28 (twenty-eight) days.    . temazepam (RESTORIL) 30 MG capsule Take 1 capsule (30 mg total) by mouth at bedtime as needed for sleep. 30 capsule 5   No current facility-administered medications on  file prior to visit.    Allergies  Allergen Reactions  . Codeine Other (See Comments)    Hallucinations    Social History   Socioeconomic History  . Marital status: Married    Spouse name: Not on file  . Number of children: Not on file  . Years of education: Not on file  . Highest education level: Not on file  Occupational History  . Not on file  Social Needs  . Financial resource strain: Not on file  . Food insecurity:    Worry: Not on file    Inability: Not on file  . Transportation needs:    Medical: Not on file    Non-medical: Not on file  Tobacco Use  . Smoking status: Current Every Day Smoker    Types: E-cigarettes  . Smokeless tobacco:  Never Used  Substance and Sexual Activity  . Alcohol use: Yes    Alcohol/week: 0.0 oz    Comment: rarely  . Drug use: No  . Sexual activity: Not on file  Lifestyle  . Physical activity:    Days per week: Not on file    Minutes per session: Not on file  . Stress: Not on file  Relationships  . Social connections:    Talks on phone: Not on file    Gets together: Not on file    Attends religious service: Not on file    Active member of club or organization: Not on file    Attends meetings of clubs or organizations: Not on file    Relationship status: Not on file  . Intimate partner violence:    Fear of current or ex partner: Not on file    Emotionally abused: Not on file    Physically abused: Not on file    Forced sexual activity: Not on file  Other Topics Concern  . Not on file  Social History Narrative  . Not on file      Review of Systems  All other systems reviewed and are negative.      Objective:   Physical Exam  Constitutional: She is oriented to person, place, and time. She appears well-developed and well-nourished. No distress.  HENT:  Head: Normocephalic and atraumatic.  Right Ear: External ear normal.  Left Ear: External ear normal.  Nose: Nose normal.  Mouth/Throat: Oropharynx is clear and moist. No oropharyngeal exudate.  Cardiovascular: Normal rate, regular rhythm and normal heart sounds. Exam reveals no gallop and no friction rub.  No murmur heard. Pulmonary/Chest: Effort normal and breath sounds normal. No stridor. No respiratory distress. She has no wheezes. She has no rales.  Abdominal: Soft. Bowel sounds are normal.  Neurological: She is alert and oriented to person, place, and time. She displays normal reflexes. No cranial nerve deficit or sensory deficit. She exhibits normal muscle tone. Coordination normal.  Skin: She is not diaphoretic.  Psychiatric: She has a normal mood and affect. Her behavior is normal. Thought content normal.  Vitals  reviewed.         Assessment & Plan:  Ventricular tachycardia, nonsustained (HCC) - Plan: EKG 12-Lead  Transient alteration of awareness  Syncope, unspecified syncope type  Discussed the situation with the patient.  Differential diagnosis includes overmedication versus absent (atypical) seizure's.  Other possibilities would be cardiogenic syncope, somatizations disorder.  I have recommended decreasing her medication temporarily to see if episodes persist.  I have recommended discontinuing imipramine that she takes for pain and insomnia.  I have recommended reducing the  dose of baclofen to 5 mg twice daily due to its sedative effects.  I hesitate to decrease the dose of gabapentin given its antiepileptic effect in case this is in fact some type of seizure behavior.  I have also recommended decreasing her dose of temazepam by 50%.  I would like to recheck the patient on Monday to see if behavior continues.  I reached out to the patient's neurologist but he was not in the office.  His nurse states that the patient has had several EEG's and therefore they do not believe this is seizure behavior.  Therefore I was unable to schedule the patient a visit tomorrow with her neurologist.  I will however send him in a message to make him aware of the situation.

## 2018-03-11 ENCOUNTER — Ambulatory Visit (INDEPENDENT_AMBULATORY_CARE_PROVIDER_SITE_OTHER): Payer: Managed Care, Other (non HMO) | Admitting: Family Medicine

## 2018-03-11 ENCOUNTER — Encounter: Payer: Self-pay | Admitting: Family Medicine

## 2018-03-11 VITALS — BP 130/86 | HR 92 | Temp 98.6°F | Resp 16 | Ht 64.0 in | Wt 196.0 lb

## 2018-03-11 DIAGNOSIS — R404 Transient alteration of awareness: Secondary | ICD-10-CM | POA: Diagnosis not present

## 2018-03-11 DIAGNOSIS — I472 Ventricular tachycardia: Secondary | ICD-10-CM

## 2018-03-11 DIAGNOSIS — I4729 Other ventricular tachycardia: Secondary | ICD-10-CM

## 2018-03-11 MED ORDER — ATENOLOL 50 MG PO TABS: 50.0000 mg | ORAL_TABLET | Freq: Every day | ORAL | 3 refills | Status: DC

## 2018-03-11 NOTE — Progress Notes (Signed)
Subjective:    Patient ID: Virginia [name], female    DOB: 05-07-1981, 37 y.o.   MRN: [id number]  HPI  03/07/18 Patient is urgently worked in today based on a message she sent me this morning.  Apparently she is having episodes of amnestic behavior.  For instance she recently woke up in bed covered and potato chips.  She has no recollection of going to bed or eating potato chips.  She also had an instance where she was playing a game on her phone.  When she woke up, she was still holding her phone.  She was standing.  However she had a pill in her mouth.  She has no recollection of taking the medication.  Her son states that yesterday, she sat down with him to watch a show on television.  She remembers sitting down to watch TV.  After that she has no memory.  Son states that she had her eyes open was staring blankly.  She seemed extremely lethargic.  She was mumbling incoherently.  He was unable to wake her up and bring her around.  These behaviors are lasting minutes to an hour.  They are associated with a period of altered consciousness, amnesia, unusual behavior, and lethargy after the associated event.  Episodes sound atypical for syncope.  I do not believe this is cardiac in nature despite her recent episode of ventricular tachycardia.  Because episodes that are witnessed, the patient has her eyes open, she is lethargic, however she is still physically interacting with her environment as demonstrated by the potato chips, taking the medication that she has no recollection of, etc.  Episodes sound like overmedication versus atypical seizure (absent seizure).  EKG is performed today and shows a normal QTc interval.  Therefore I do not believe medications are prolonging her QTc interval causing repeated episodes of ventricular tachycardia.  I do not believe beta-blockers will have this effect on an individual.  Reviewing her medication list, she is on 50 mg of imipramine, she takes 10 mg of baclofen 3 times  daily.  She takes 600 mg of gabapentin 4 times a day, she is taking temazepam 30 mg a day.  She has been on all of these medications for a prolonged period of time and has habituated to them up to this point.  At that time, my plan was: Discussed the situation with the patient.  Differential diagnosis includes overmedication versus absent (atypical) seizure's.  Other possibilities would be cardiogenic syncope, somatizations disorder.  I have recommended decreasing her medication temporarily to see if episodes persist.  I have recommended discontinuing imipramine that she takes for pain and insomnia.  I have recommended reducing the dose of baclofen to 5 mg twice daily due to its sedative effects.  I hesitate to decrease the dose of gabapentin given its antiepileptic effect in case this is in fact some type of seizure behavior.  I have also recommended decreasing her dose of temazepam by 50%.  I would like to recheck the patient on Monday to see if behavior continues.  I reached out to the patient's neurologist but he was not in the office.  His nurse states that the patient has had several EEG's and therefore they do not believe this is seizure behavior.  Therefore I was unable to schedule the patient a visit tomorrow with her neurologist.  I will however send him in a message to make him aware of the situation.  03/11/18 Patient is here today for follow-up.  Since I last saw her, she has had no further episodes of altered mental status.  She is here today with her partner.  He denies any episodes of confusion, absent staring, she denies any episodes of bizarre behavior without recollection or retrograde amnesia.  They both deny any seizure activity.  [name] is been very short since I last saw her but I am cautiously optimistic that this may be medication side effects.  Unfortunately since I last saw her, she has had several episodes of tachycardia.  Her heart will feel like it is pounding out of her chest.   Episodes can last 2 to 3 seconds.  This morning and episodes seem to go much longer.  She denies any syncope or lightheadedness.  She is currently taking atenolol 25 mg a day Past Medical History:  Diagnosis Date  . Anxiety   . Headache   . Insomnia   . Multiple sclerosis (HCC)   . Nystagmus 02/15/2017  . Syncope and collapse    No past surgical history on file. Current Outpatient Medications on File Prior to Visit  Medication Sig Dispense Refill  . baclofen (LIORESAL) 10 MG tablet Take 1 tablet (10 mg total) 3 (three) times daily by mouth. 90 tablet 11  . gabapentin (NEURONTIN) 600 MG tablet Take 1 tablet (600 mg total) by mouth 4 (four) times daily. 360 tablet 1  . imipramine (TOFRANIL) 25 MG tablet Take 2 tablets (50 mg total) by mouth at bedtime. 180 tablet 1  . meloxicam (MOBIC) 15 MG tablet TAKE 1 TABLET BY MOUTH EVERY DAY 30 tablet 5  . natalizumab (TYSABRI) 300 MG/15ML injection Inject 300 mg into the vein every 28 (twenty-eight) days.    . temazepam (RESTORIL) 30 MG capsule Take 1 capsule (30 mg total) by mouth at bedtime as needed for sleep. 30 capsule 5   No current facility-administered medications on file prior to visit.    Allergies  Allergen Reactions  . Codeine Other (See Comments)    Hallucinations    Social History   Socioeconomic History  . Marital status: Married    Spouse name: Not on file  . Number of children: Not on file  . Years of education: Not on file  . Highest education level: Not on file  Occupational History  . Not on file  Social Needs  . Financial resource strain: Not on file  . Food insecurity:    Worry: Not on file    Inability: Not on file  . Transportation needs:    Medical: Not on file    Non-medical: Not on file  Tobacco Use  . Smoking status: Current Every Day Smoker    Types: E-cigarettes  . Smokeless tobacco: Never Used  Substance and Sexual Activity  . Alcohol use: Yes    Alcohol/week: 0.0 oz    Comment: rarely  . Drug  use: No  . Sexual activity: Not on file  Lifestyle  . Physical activity:    Days per week: Not on file    Minutes per session: Not on file  . Stress: Not on file  Relationships  . Social connections:    Talks on phone: Not on file    Gets together: Not on file    Attends religious service: Not on file    Active member of club or organization: Not on file    Attends meetings of clubs or organizations: Not on file    Relationship status: Not on file  . Intimate partner violence:  Fear of current or ex partner: Not on file    Emotionally abused: Not on file    Physically abused: Not on file    Forced sexual activity: Not on file  Other Topics Concern  . Not on file  Social History Narrative  . Not on file      Review of Systems  All other systems reviewed and are negative.      Objective:   Physical Exam  Constitutional: She is oriented to person, place, and time. She appears well-developed and well-nourished. No distress.  HENT:  Head: Normocephalic and atraumatic.  Right Ear: External ear normal.  Left Ear: External ear normal.  Nose: Nose normal.  Mouth/Throat: Oropharynx is clear and moist. No oropharyngeal exudate.  Cardiovascular: Normal rate, regular rhythm and normal heart sounds. Exam reveals no gallop and no friction rub.  No murmur heard. Pulmonary/Chest: Effort normal and breath sounds normal. No stridor. No respiratory distress. She has no wheezes. She has no rales.  Abdominal: Soft. Bowel sounds are normal.  Neurological: She is alert and oriented to person, place, and time. She displays normal reflexes. No cranial nerve deficit or sensory deficit. She exhibits normal muscle tone. Coordination normal.  Skin: She is not diaphoretic.  Psychiatric: She has a normal mood and affect. Her behavior is normal. Thought content normal.  Vitals reviewed.         Assessment & Plan:  Ventricular tachycardia, nonsustained (HCC)  Transient alteration of  awareness  Patient would like to cut back on the gabapentin because of the way it makes her feel rather than the medications we held at her last visit.  I have recommended that since she has had no further episodes we make no further changes in her medications at the present time and simply monitor her over the next 1 to 2 weeks to see if these episodes continue to occur.  However, she should discuss with her neurologist cutting down on her medication if she decides this is in fact her best course of action.  Regarding her episode of tachycardia, I have recommended increasing the atenolol to 50 mg a day.  I believe that her previous dose was insufficient given her body weight.  If she continues to have episodes of tachycardia, I will contact her cardiologist to discuss further options.

## 2018-03-12 ENCOUNTER — Telehealth: Payer: Self-pay | Admitting: *Deleted

## 2018-03-12 DIAGNOSIS — R404 Transient alteration of awareness: Secondary | ICD-10-CM

## 2018-03-12 NOTE — Telephone Encounter (Signed)
EEG ordered/fim

## 2018-03-12 NOTE — Telephone Encounter (Signed)
-----   Message from Asa Lente, MD sent at 03/11/2018  6:08 PM EDT ----- Virginia [name], Thank you for the update.  I reviewed your note.  Due to the duration of the episodes (many minutes) the most likely represent either overmedication or somatization.  However, I feel we should check an EEG to make sure that there are no epileptiform activity and we will set that up for her.  I agree with the reductions in medication.  Thank you Richard   Chang Tiggs:   Please set up an EEG and let Rachael know. RAS ----- Message ----- From: [name], MD Sent: 03/07/2018   4:50 PM To: Asa Lente, MD  I wanted to make you aware of the situation with our mutual patient.  It is outlined in my office note from today.  Do you think these could be atypical seizures? I hesitated to change any of her medications without your approval but didn't know what else to try.  I was not trying to overstep. Thanks  Elijah Birk

## 2018-03-19 ENCOUNTER — Ambulatory Visit (HOSPITAL_COMMUNITY)
Admission: RE | Admit: 2018-03-19 | Discharge: 2018-03-19 | Disposition: A | Discharge status: Home / Self Care | Payer: Managed Care, Other (non HMO) | Source: Ambulatory Visit | Attending: Cardiovascular Disease | Admitting: Cardiovascular Disease

## 2018-03-19 DIAGNOSIS — I429 Cardiomyopathy, unspecified: Secondary | ICD-10-CM | POA: Diagnosis present

## 2018-03-19 DIAGNOSIS — R002 Palpitations: Secondary | ICD-10-CM | POA: Diagnosis not present

## 2018-03-19 LAB — CREATININE, SERUM
CREATININE: 1.04 mg/dL — AB (ref 0.44–1.00)
GFR calc Af Amer: >60 mL/min (ref >60)

## 2018-03-19 MED ORDER — GADOBENATE DIMEGLUMINE 529 MG/ML IV SOLN
30.0000 mL | Freq: Once | INTRAVENOUS | Status: AC
  Administered 2018-03-19: 30 mL via INTRAVENOUS

## 2018-03-21 ENCOUNTER — Encounter: Payer: Self-pay | Admitting: Internal Medicine

## 2018-03-22 ENCOUNTER — Encounter: Payer: Self-pay | Admitting: Family Medicine

## 2018-04-01 ENCOUNTER — Encounter: Payer: Self-pay | Admitting: Cardiovascular Disease

## 2018-04-01 ENCOUNTER — Telehealth: Payer: Self-pay | Admitting: Adult Health

## 2018-04-01 ENCOUNTER — Encounter: Payer: Self-pay | Admitting: Internal Medicine

## 2018-04-01 NOTE — Telephone Encounter (Signed)
Patient called to discuss some concerns. I discussed these with the patient and advised that I would relay to Dr. Epimenio Foot.

## 2018-04-10 ENCOUNTER — Encounter: Payer: Self-pay | Admitting: Internal Medicine

## 2018-04-10 ENCOUNTER — Ambulatory Visit (INDEPENDENT_AMBULATORY_CARE_PROVIDER_SITE_OTHER): Payer: Managed Care, Other (non HMO) | Admitting: Internal Medicine

## 2018-04-10 VITALS — BP 117/82 | HR 85 | Ht 64.0 in | Wt 200.0 lb

## 2018-04-10 DIAGNOSIS — R002 Palpitations: Secondary | ICD-10-CM

## 2018-04-10 MED ORDER — ATENOLOL 50 MG PO TABS: ORAL_TABLET | ORAL | 3 refills | Status: DC

## 2018-04-10 NOTE — Progress Notes (Signed)
HPI [name] returns today for followup of palpitations and NSVT. She is a pleasant 36yo woman with palpitations who was found to have NSVT. She underwent evaluation which demonstrated that she had normal LV function and no gadolinium uptake on her cardiac MR. On atenolol 50 mg daily she has done better but still has some nocturnal break throughs of her symptoms. No other complaints. She has tolerated the atenolol well. Allergies  Allergen Reactions  . Codeine Other (See Comments)    Hallucinations      Current Outpatient Medications  Medication Sig Dispense Refill  . atenolol (TENORMIN) 50 MG tablet Take 1 tablet (50 mg total) by mouth daily. 30 tablet 3  . baclofen (LIORESAL) 10 MG tablet Take 1 tablet (10 mg total) 3 (three) times daily by mouth. 90 tablet 11  . gabapentin (NEURONTIN) 600 MG tablet Take 1 tablet (600 mg total) by mouth 4 (four) times daily. 360 tablet 1  . meloxicam (MOBIC) 15 MG tablet Take 15 mg by mouth as needed for pain.    . natalizumab (TYSABRI) 300 MG/15ML injection Inject 300 mg into the vein every 28 (twenty-eight) days.    . temazepam (RESTORIL) 30 MG capsule Take 1 capsule (30 mg total) by mouth at bedtime as needed for sleep. 30 capsule 5  . meloxicam (MOBIC) 15 MG tablet TAKE 1 TABLET BY MOUTH EVERY DAY 30 tablet 5   No current facility-administered medications for this visit.      Past Medical History:  Diagnosis Date  . Anxiety   . Headache   . Insomnia   . Multiple sclerosis (HCC)   . Nystagmus 02/15/2017  . Syncope and collapse     ROS:   All systems reviewed and negative except as noted in the HPI.   No past surgical history on file.   Family History  Problem Relation Age of Onset  . Emphysema Mother   . Osteoarthritis Mother   . Other Father   . ALS Maternal Grandmother      Social History   Socioeconomic History  . Marital status: Married    Spouse name: Not on file  . Number of children: Not on file  . Years  of education: Not on file  . Highest education level: Not on file  Occupational History  . Not on file  Social Needs  . Financial resource strain: Not on file  . Food insecurity:    Worry: Not on file    Inability: Not on file  . Transportation needs:    Medical: Not on file    Non-medical: Not on file  Tobacco Use  . Smoking status: Current Every Day Smoker    Types: E-cigarettes  . Smokeless tobacco: Never Used  Substance and Sexual Activity  . Alcohol use: Yes    Alcohol/week: 0.0 oz    Comment: rarely  . Drug use: No  . Sexual activity: Not on file  Lifestyle  . Physical activity:    Days per week: Not on file    Minutes per session: Not on file  . Stress: Not on file  Relationships  . Social connections:    Talks on phone: Not on file    Gets together: Not on file    Attends religious service: Not on file    Active member of club or organization: Not on file    Attends meetings of clubs or organizations: Not on file    Relationship status: Not on file  .  Intimate partner violence:    Fear of current or ex partner: Not on file    Emotionally abused: Not on file    Physically abused: Not on file    Forced sexual activity: Not on file  Other Topics Concern  . Not on file  Social History Narrative  . Not on file     BP 117/82   Pulse 85   Ht 5\' 4"  (1.626 m)   Wt 200 lb (90.7 kg)   BMI 34.33 kg/m   Physical Exam:  Well appearing 37 yo woman, NAD HEENT: Unremarkable Neck:  6 cm JVD, no thyromegally Lymphatics:  No adenopathy Back:  No CVA tenderness Lungs:  Clear with no wheezes HEART:  Regular rate rhythm, no murmurs, no rubs, no clicks Abd:  soft, positive bowel sounds, no organomegally, no rebound, no guarding Ext:  2 plus pulses, no edema, no cyanosis, no clubbing Skin:  No rashes no nodules Neuro:  CN II through XII intact, motor grossly intact  Assess/Plan: 1. NSVT - she is doing well during the day but still with some nocturnal break  throughs. I have discussed the treatment options including uptitration of atenolol or adding flecainide or rhythmol. She would like to uptitrate the atenolol.  I spent 25 min including 50% face to face time with the patient to produce this note.  Leonia Reeves.D.

## 2018-04-10 NOTE — Patient Instructions (Addendum)
Medication Instructions:  Your physician has recommended you make the following change in your medication:  1.  Increase your atenolol 50 mg tablets- Take one tablet in the morning and a 1/2 tablet at night. If after 2 weeks you continue to have palpitations you can increase again to one tablet in the morning and one tablet at night.  Labwork: None ordered.  Testing/Procedures: None ordered.  Follow-Up: Keep your follow up appointment scheduled for August.  Any Other Special Instructions Will Be Listed Below (If Applicable).  If you need a refill on your cardiac medications before your next appointment, please call your pharmacy.

## 2018-04-16 ENCOUNTER — Ambulatory Visit (INDEPENDENT_AMBULATORY_CARE_PROVIDER_SITE_OTHER): Payer: Managed Care, Other (non HMO) | Admitting: Neurology

## 2018-04-16 DIAGNOSIS — R404 Transient alteration of awareness: Secondary | ICD-10-CM

## 2018-04-16 DIAGNOSIS — R4182 Altered mental status, unspecified: Secondary | ICD-10-CM | POA: Diagnosis not present

## 2018-04-16 NOTE — Progress Notes (Signed)
   [facility]  EEG (ELECTROENCEPHALOGRAM) REPORT   STUDY DATE: 04/16/2018 PATIENT NAME: Virginia [name] DOB: [birth year] MRN: [id number]  ORDERING CLINICIAN: Jara Feider A. Epimenio Foot, MD. PhD  TECHNOLOGIST: Elvis Coil TECHNIQUE: Electroencephalogram was recorded utilizing standard 10-20 system of lead placement and reformatted into average and bipolar montages.  RECORDING TIME: 21: 20 minutes  CLINICAL INFORMATION: 37 year old woman with transient alteration of awareness spells  FINDINGS: A digital EEG was performed while the patient was awake and drowsy. While awake and most alert there was a 13-14 hz posterior dominant rhythm. Voltages and frequencies were symmetric.  There were no focal, lateralizing, epileptiform activity or seizures seen.  Photic stimulation had a normal driving response. Hyperventilation and recovery did not change the underlying rhythms. EKG channel shows NSR.   Sleep was not recorded..  IMPRESSION: This EEG did not show any seizures, epileptiform or lateralizing activity.  There was increased beta activity that could be due to use of benzodiazepine.      INTERPRETING PHYSICIAN:   Tayloranne Lekas A. Epimenio Foot, MD, PhD, [facility] Certified in Neurology, Clinical Neurophysiology, Sleep Medicine, Pain Medicine and Neuroimaging  [facility] [address], [address] [place], [place] [address] [phone]

## 2018-04-17 ENCOUNTER — Encounter: Payer: Self-pay | Admitting: *Deleted

## 2018-05-06 ENCOUNTER — Encounter: Payer: Self-pay | Admitting: Internal Medicine

## 2018-05-06 ENCOUNTER — Encounter: Payer: Self-pay | Admitting: Family Medicine

## 2018-05-06 ENCOUNTER — Encounter: Payer: Self-pay | Admitting: Neurology

## 2018-05-10 ENCOUNTER — Ambulatory Visit: Payer: Managed Care, Other (non HMO) | Admitting: Internal Medicine

## 2018-05-13 ENCOUNTER — Ambulatory Visit (INDEPENDENT_AMBULATORY_CARE_PROVIDER_SITE_OTHER): Payer: Managed Care, Other (non HMO) | Admitting: Family Medicine

## 2018-05-13 ENCOUNTER — Encounter: Payer: Self-pay | Admitting: Family Medicine

## 2018-05-13 VITALS — BP 110/76 | HR 106 | Temp 98.7°F | Resp 14 | Ht 64.0 in | Wt 203.0 lb

## 2018-05-13 DIAGNOSIS — R404 Transient alteration of awareness: Secondary | ICD-10-CM

## 2018-05-13 DIAGNOSIS — Z79899 Other long term (current) drug therapy: Secondary | ICD-10-CM

## 2018-05-13 DIAGNOSIS — F419 Anxiety disorder, unspecified: Secondary | ICD-10-CM

## 2018-05-13 MED ORDER — DICLOFENAC SODIUM 75 MG PO TBEC: 75.0000 mg | DELAYED_RELEASE_TABLET | Freq: Two times a day (BID) | ORAL | 1 refills | Status: DC

## 2018-05-13 MED ORDER — ESCITALOPRAM OXALATE 10 MG PO TABS: 10.0000 mg | ORAL_TABLET | Freq: Every day | ORAL | 5 refills | Status: DC

## 2018-05-13 NOTE — Progress Notes (Signed)
Subjective:    Patient ID: Virginia [name], female    DOB: 05-07-1981, 37 y.o.   MRN: [id number]  HPI  03/07/18 Patient is urgently worked in today based on a message she sent me this morning.  Apparently she is having episodes of amnestic behavior.  For instance she recently woke up in bed covered and potato chips.  She has no recollection of going to bed or eating potato chips.  She also had an instance where she was playing a game on her phone.  When she woke up, she was still holding her phone.  She was standing.  However she had a pill in her mouth.  She has no recollection of taking the medication.  Her son states that yesterday, she sat down with him to watch a show on television.  She remembers sitting down to watch TV.  After that she has no memory.  Son states that she had her eyes open was staring blankly.  She seemed extremely lethargic.  She was mumbling incoherently.  He was unable to wake her up and bring her around.  These behaviors are lasting minutes to an hour.  They are associated with a period of altered consciousness, amnesia, unusual behavior, and lethargy after the associated event.  Episodes sound atypical for syncope.  I do not believe this is cardiac in nature despite her recent episode of ventricular tachycardia.  Because episodes that are witnessed, the patient has her eyes open, she is lethargic, however she is still physically interacting with her environment as demonstrated by the potato chips, taking the medication that she has no recollection of, etc.  Episodes sound like overmedication versus atypical seizure (absent seizure).  EKG is performed today and shows a normal QTc interval.  Therefore I do not believe medications are prolonging her QTc interval causing repeated episodes of ventricular tachycardia.  I do not believe beta-blockers will have this effect on an individual.  Reviewing her medication list, she is on 50 mg of imipramine, she takes 10 mg of baclofen 3 times  daily.  She takes 600 mg of gabapentin 4 times a day, she is taking temazepam 30 mg a day.  She has been on all of these medications for a prolonged period of time and has habituated to them up to this point.  At that time, my plan was: Discussed the situation with the patient.  Differential diagnosis includes overmedication versus absent (atypical) seizure's.  Other possibilities would be cardiogenic syncope, somatizations disorder.  I have recommended decreasing her medication temporarily to see if episodes persist.  I have recommended discontinuing imipramine that she takes for pain and insomnia.  I have recommended reducing the dose of baclofen to 5 mg twice daily due to its sedative effects.  I hesitate to decrease the dose of gabapentin given its antiepileptic effect in case this is in fact some type of seizure behavior.  I have also recommended decreasing her dose of temazepam by 50%.  I would like to recheck the patient on Monday to see if behavior continues.  I reached out to the patient's neurologist but he was not in the office.  His nurse states that the patient has had several EEG's and therefore they do not believe this is seizure behavior.  Therefore I was unable to schedule the patient a visit tomorrow with her neurologist.  I will however send him in a message to make him aware of the situation.  03/11/18 Patient is here today for follow-up.  Since I last saw her, she has had no further episodes of altered mental status.  She is here today with her partner.  He denies any episodes of confusion, absent staring, she denies any episodes of bizarre behavior without recollection or retrograde amnesia.  They both deny any seizure activity.  Virginia [name] is been very short since I last saw her but I am cautiously optimistic that this may be medication side effects.  Unfortunately since I last saw her, she has had several episodes of tachycardia.  Her heart will feel like it is pounding out of her chest.   Episodes can last 2 to 3 seconds.  This morning and episodes seem to go much longer.  She denies any syncope or lightheadedness.  She is currently taking atenolol 25 mg a day.  At that time, my plan was:  Patient would like to cut back on the gabapentin because of the way it makes her feel rather than the medications we held at her last visit.  I have recommended that since she has had no further episodes we make no further changes in her medications at the present time and simply monitor her over the next 1 to 2 weeks to see if these episodes continue to occur.  However, she should discuss with her neurologist cutting down on her medication if she decides this is in fact her best course of action.  Regarding her episode of tachycardia, I have recommended increasing the atenolol to 50 mg a day.  I believe that her previous dose was insufficient given her body weight.  If she continues to have episodes of tachycardia, I will contact her cardiologist to discuss further options.  05/13/18 Please see the patient's recent email.  She is here today to discuss this further.  She states 3-4 times a week, she will awaken startled, disoriented, and in a panic.  Her heart will race.  Symptoms can last minutes up to 30 minutes.  When I asked her to clarify, she admits that she is usually laying down on the couch to watch TV.  She will then pass out and awake and confused sometime later.  Sometimes she will be disoriented and talking in a confused manner.  However her husband states that she is not slurring her speech or appearing intoxicated.  Instead she is enunciating well and speaking normally but about confusing and incoherent subjects.  She is currently taking baclofen 10 mg 3 times a day for chronic low back pain in addition to gabapentin 600 mg p.o. 3 times daily for neuropathic pain.  MRI of the lumbar spine earlier this year revealed a mild small bulging disc at L5-S1 possibly touching the S1 nerve roots.   Apparently she tried an epidural steroid injection but she states that she "went crazy in the office and passed out". Past Medical History:  Diagnosis Date  . Anxiety   . Headache   . Insomnia   . Multiple sclerosis (HCC)   . Nystagmus 02/15/2017  . Syncope and collapse    No past surgical history on file. Current Outpatient Medications on File Prior to Visit  Medication Sig Dispense Refill  . atenolol (TENORMIN) 50 MG tablet Take one tablet in the morning and 1/2 tablet at night 180 tablet 3  . baclofen (LIORESAL) 10 MG tablet Take 1 tablet (10 mg total) 3 (three) times daily by mouth. 90 tablet 11  . gabapentin (NEURONTIN) 600 MG tablet Take 1 tablet (600 mg total) by mouth 4 (four)  times daily. 360 tablet 1  . natalizumab (TYSABRI) 300 MG/15ML injection Inject 300 mg into the vein every 28 (twenty-eight) days.    . temazepam (RESTORIL) 30 MG capsule Take 1 capsule (30 mg total) by mouth at bedtime as needed for sleep. 30 capsule 5   No current facility-administered medications on file prior to visit.    Allergies  Allergen Reactions  . Codeine Other (See Comments)    Hallucinations    Social History   Socioeconomic History  . Marital status: Married    Spouse name: Not on file  . Number of children: Not on file  . Years of education: Not on file  . Highest education level: Not on file  Occupational History  . Not on file  Social Needs  . Financial resource strain: Not on file  . Food insecurity:    Worry: Not on file    Inability: Not on file  . Transportation needs:    Medical: Not on file    Non-medical: Not on file  Tobacco Use  . Smoking status: Current Every Day Smoker    Types: E-cigarettes  . Smokeless tobacco: Never Used  Substance and Sexual Activity  . Alcohol use: Yes    Alcohol/week: 0.0 oz    Comment: rarely  . Drug use: No  . Sexual activity: Not on file  Lifestyle  . Physical activity:    Days per week: Not on file    Minutes per session: Not  on file  . Stress: Not on file  Relationships  . Social connections:    Talks on phone: Not on file    Gets together: Not on file    Attends religious service: Not on file    Active member of club or organization: Not on file    Attends meetings of clubs or organizations: Not on file    Relationship status: Not on file  . Intimate partner violence:    Fear of current or ex partner: Not on file    Emotionally abused: Not on file    Physically abused: Not on file    Forced sexual activity: Not on file  Other Topics Concern  . Not on file  Social History Narrative  . Not on file      Review of Systems  All other systems reviewed and are negative.      Objective:   Physical Exam  Constitutional: She is oriented to person, place, and time. She appears well-developed and well-nourished. No distress.  HENT:  Head: Normocephalic and atraumatic.  Right Ear: External ear normal.  Left Ear: External ear normal.  Nose: Nose normal.  Mouth/Throat: Oropharynx is clear and moist. No oropharyngeal exudate.  Cardiovascular: Normal rate, regular rhythm and normal heart sounds. Exam reveals no gallop and no friction rub.  No murmur heard. Pulmonary/Chest: Effort normal and breath sounds normal. No stridor. No respiratory distress. She has no wheezes. She has no rales.  Abdominal: Soft. Bowel sounds are normal.  Neurological: She is alert and oriented to person, place, and time. She displays normal reflexes. No cranial nerve deficit or sensory deficit. She exhibits normal muscle tone. Coordination normal.  Skin: She is not diaphoretic.  Psychiatric: She has a normal mood and affect. Her behavior is normal. Thought content normal.  Vitals reviewed.         Assessment & Plan:  Transient alteration of awareness  Anxiety  Polypharmacy  I believe that she is passing out due to medication.  I  believe she then awakens confused and disoriented and has a panic attack causing her  tachycardia or perceived tachycardia.  Therefore I recommended decreasing her sedating medication.  I have asked her to discontinue baclofen.  She will replace it with diclofenac 75 mg p.o. twice daily for low back pain.  We will start Lexapro 10 mg p.o. daily in an effort to try to prevent and control panic attacks and I will see the patient back in 4 weeks.  At that time I will try to decrease her dose of gabapentin.  I believe that with less sedating medication, she will have less of these incidents.  However we will likely need to find better options for pain control possibly through a pain clinic.

## 2018-05-24 ENCOUNTER — Encounter: Payer: Self-pay | Admitting: Physical Medicine & Rehabilitation

## 2018-05-31 ENCOUNTER — Ambulatory Visit: Payer: Managed Care, Other (non HMO)

## 2018-05-31 ENCOUNTER — Ambulatory Visit: Payer: Managed Care, Other (non HMO) | Admitting: Physical Medicine & Rehabilitation

## 2018-05-31 ENCOUNTER — Ambulatory Visit: Payer: Managed Care, Other (non HMO) | Admitting: Cardiovascular Disease

## 2018-06-10 ENCOUNTER — Encounter: Payer: Self-pay | Admitting: *Deleted

## 2018-06-10 ENCOUNTER — Encounter: Payer: Self-pay | Admitting: Family Medicine

## 2018-06-10 ENCOUNTER — Ambulatory Visit (INDEPENDENT_AMBULATORY_CARE_PROVIDER_SITE_OTHER): Payer: Managed Care, Other (non HMO) | Admitting: Family Medicine

## 2018-06-10 VITALS — BP 120/80 | HR 94 | Temp 98.1°F | Resp 16 | Ht 64.0 in | Wt 203.0 lb

## 2018-06-10 DIAGNOSIS — R404 Transient alteration of awareness: Secondary | ICD-10-CM | POA: Diagnosis not present

## 2018-06-10 DIAGNOSIS — G4710 Hypersomnia, unspecified: Secondary | ICD-10-CM

## 2018-06-10 DIAGNOSIS — G4700 Insomnia, unspecified: Secondary | ICD-10-CM | POA: Diagnosis not present

## 2018-06-10 DIAGNOSIS — Z79899 Other long term (current) drug therapy: Secondary | ICD-10-CM

## 2018-06-10 MED ORDER — AMITRIPTYLINE HCL 50 MG PO TABS: 50.0000 mg | ORAL_TABLET | Freq: Every evening | ORAL | 1 refills | Status: DC | PRN

## 2018-06-10 NOTE — Progress Notes (Signed)
Subjective:    Patient ID: Virginia [name], female    DOB: 12-18-1980, 37 y.o.   MRN: [id number]  HPI  03/07/18 Patient is urgently worked in today based on a message she sent me this morning.  Apparently she is having episodes of amnestic behavior.  For instance she recently woke up in bed covered and potato chips.  She has no recollection of going to bed or eating potato chips.  She also had an instance where she was playing a game on her phone.  When she woke up, she was still holding her phone.  She was standing.  However she had a pill in her mouth.  She has no recollection of taking the medication.  Her son states that yesterday, she sat down with him to watch a show on television.  She remembers sitting down to watch TV.  After that she has no memory.  Son states that she had her eyes open was staring blankly.  She seemed extremely lethargic.  She was mumbling incoherently.  He was unable to wake her up and bring her around.  These behaviors are lasting minutes to an hour.  They are associated with a period of altered consciousness, amnesia, unusual behavior, and lethargy after the associated event.  Episodes sound atypical for syncope.  I do not believe this is cardiac in nature despite her recent episode of ventricular tachycardia.  Because episodes that are witnessed, the patient has her eyes open, she is lethargic, however she is still physically interacting with her environment as demonstrated by the potato chips, taking the medication that she has no recollection of, etc.  Episodes sound like overmedication versus atypical seizure (absent seizure).  EKG is performed today and shows a normal QTc interval.  Therefore I do not believe medications are prolonging her QTc interval causing repeated episodes of ventricular tachycardia.  I do not believe beta-blockers will have this effect on an individual.  Reviewing her medication list, she is on 50 mg of imipramine, she takes 10 mg of baclofen 3 times  daily.  She takes 600 mg of gabapentin 4 times a day, she is taking temazepam 30 mg a day.  She has been on all of these medications for a prolonged period of time and has habituated to them up to this point.  At that time, my plan was: Discussed the situation with the patient.  Differential diagnosis includes overmedication versus absent (atypical) seizure's.  Other possibilities would be cardiogenic syncope, somatizations disorder.  I have recommended decreasing her medication temporarily to see if episodes persist.  I have recommended discontinuing imipramine that she takes for pain and insomnia.  I have recommended reducing the dose of baclofen to 5 mg twice daily due to its sedative effects.  I hesitate to decrease the dose of gabapentin given its antiepileptic effect in case this is in fact some type of seizure behavior.  I have also recommended decreasing her dose of temazepam by 50%.  I would like to recheck the patient on Monday to see if behavior continues.  I reached out to the patient's neurologist but he was not in the office.  His nurse states that the patient has had several EEG's and therefore they do not believe this is seizure behavior.  Therefore I was unable to schedule the patient a visit tomorrow with her neurologist.  I will however send him in a message to make him aware of the situation.  03/11/18 Patient is here today for follow-up.  Since I last saw her, she has had no further episodes of altered mental status.  She is here today with her partner.  He denies any episodes of confusion, absent staring, she denies any episodes of bizarre behavior without recollection or retrograde amnesia.  They both deny any seizure activity.  [name] is been very short since I last saw her but I am cautiously optimistic that this may be medication side effects.  Unfortunately since I last saw her, she has had several episodes of tachycardia.  Her heart will feel like it is pounding out of her chest.   Episodes can last 2 to 3 seconds.  This morning and episodes seem to go much longer.  She denies any syncope or lightheadedness.  She is currently taking atenolol 25 mg a day.  At that time, my plan was:  Patient would like to cut back on the gabapentin because of the way it makes her feel rather than the medications we held at her last visit.  I have recommended that since she has had no further episodes we make no further changes in her medications at the present time and simply monitor her over the next 1 to 2 weeks to see if these episodes continue to occur.  However, she should discuss with her neurologist cutting down on her medication if she decides this is in fact her best course of action.  Regarding her episode of tachycardia, I have recommended increasing the atenolol to 50 mg a day.  I believe that her previous dose was insufficient given her body weight.  If she continues to have episodes of tachycardia, I will contact her cardiologist to discuss further options.  05/13/18 Please see the patient's recent email.  She is here today to discuss this further.  She states 3-4 times a week, she will awaken startled, disoriented, and in a panic.  Her heart will race.  Symptoms can last minutes up to 30 minutes.  When I asked her to clarify, she admits that she is usually laying down on the couch to watch TV.  She will then pass out and awake and confused sometime later.  Sometimes she will be disoriented and talking in a confused manner.  However her husband states that she is not slurring her speech or appearing intoxicated.  Instead she is enunciating well and speaking normally but about confusing and incoherent subjects.  She is currently taking baclofen 10 mg 3 times a day for chronic low back pain in addition to gabapentin 600 mg p.o. 3 times daily for neuropathic pain.  MRI of the lumbar spine earlier this year revealed a mild small bulging disc at L5-S1 possibly touching the S1 nerve roots.   Apparently she tried an epidural steroid injection but she states that she "went crazy in the office and passed out".  At that time, my plan was: I believe that she is passing out due to medication.  I believe she then awakens confused and disoriented and has a panic attack causing her tachycardia or perceived tachycardia.  Therefore I recommended decreasing her sedating medication.  I have asked her to discontinue baclofen.  She will replace it with diclofenac 75 mg p.o. twice daily for low back pain.  We will start Lexapro 10 mg p.o. daily in an effort to try to prevent and control panic attacks and I will see the patient back in 4 weeks.  At that time I will try to decrease her dose of gabapentin.  I believe  that with less sedating medication, she will have less of these incidents.  However we will likely need to find better options for pain control possibly through a pain clinic.  06/10/18 Patient states the Lexapro was not helping at all.  She continues to awaken from sleeping in a panic with her heart racing however she is now not quite as disoriented.  Unfortunately the patient continues to use baclofen.  She is try to use less but she continues to have severe muscle spasms and she requires medication.  Her biggest concern is that she is not sleeping.  She states that she goes all night long without sleeping.  She will sometimes drift off during the day for 2 or 3 hours at a time then awaken startled in a panic slightly confused.  However she is not getting decent restorative sleep.  She goes to sleep with the TV on at night.  She needs background noise to fall asleep.  She will toss and turn for hours.  Then she gets up and watch TV or plays with her dog.  She drinks caffeine all throughout the day even up until bedtime.  She is not getting any regular aerobic exercise.  She is not doing any physical activity.  He is taking temazepam prior to bedtime and she has been taking this for 2 years but is lost  its effectiveness and now does not cause her to feel sleepy.  However she does take cat naps throughout the day due to just simply passing out from exhaustion coupled with her medication.  She states that she has to lay down during the day because her back hurts and she will easily fall asleep then Past Medical History:  Diagnosis Date  . Anxiety   . Headache   . Insomnia   . Multiple sclerosis (HCC)   . Nystagmus 02/15/2017  . Syncope and collapse    No past surgical history on file. Current Outpatient Medications on File Prior to Visit  Medication Sig Dispense Refill  . atenolol (TENORMIN) 50 MG tablet Take one tablet in the morning and 1/2 tablet at night 180 tablet 3  . baclofen (LIORESAL) 10 MG tablet Take 1 tablet (10 mg total) 3 (three) times daily by mouth. 90 tablet 11  . diclofenac (VOLTAREN) 75 MG EC tablet Take 1 tablet (75 mg total) by mouth 2 (two) times daily. 60 tablet 1  . escitalopram (LEXAPRO) 10 MG tablet Take 1 tablet (10 mg total) by mouth daily. 30 tablet 5  . gabapentin (NEURONTIN) 600 MG tablet Take 1 tablet (600 mg total) by mouth 4 (four) times daily. 360 tablet 1  . natalizumab (TYSABRI) 300 MG/15ML injection Inject 300 mg into the vein every 28 (twenty-eight) days.    . temazepam (RESTORIL) 30 MG capsule Take 1 capsule (30 mg total) by mouth at bedtime as needed for sleep. 30 capsule 5   No current facility-administered medications on file prior to visit.    Allergies  Allergen Reactions  . Codeine Other (See Comments)    Hallucinations    Social History   Socioeconomic History  . Marital status: Married    Spouse name: Not on file  . Number of children: Not on file  . Years of education: Not on file  . Highest education level: Not on file  Occupational History  . Not on file  Social Needs  . Financial resource strain: Not on file  . Food insecurity:    Worry: Not on file  Inability: Not on file  . Transportation needs:    Medical: Not on  file    Non-medical: Not on file  Tobacco Use  . Smoking status: Current Every Day Smoker    Types: E-cigarettes  . Smokeless tobacco: Never Used  Substance and Sexual Activity  . Alcohol use: Yes    Alcohol/week: 0.0 standard drinks    Comment: rarely  . Drug use: No  . Sexual activity: Not on file  Lifestyle  . Physical activity:    Days per week: Not on file    Minutes per session: Not on file  . Stress: Not on file  Relationships  . Social connections:    Talks on phone: Not on file    Gets together: Not on file    Attends religious service: Not on file    Active member of club or organization: Not on file    Attends meetings of clubs or organizations: Not on file    Relationship status: Not on file  . Intimate partner violence:    Fear of current or ex partner: Not on file    Emotionally abused: Not on file    Physically abused: Not on file    Forced sexual activity: Not on file  Other Topics Concern  . Not on file  Social History Narrative  . Not on file      Review of Systems  All other systems reviewed and are negative.      Objective:   Physical Exam  Constitutional: She is oriented to person, place, and time. She appears well-developed and well-nourished. No distress.  HENT:  Head: Normocephalic and atraumatic.  Right Ear: External ear normal.  Left Ear: External ear normal.  Nose: Nose normal.  Mouth/Throat: Oropharynx is clear and moist. No oropharyngeal exudate.  Cardiovascular: Normal rate, regular rhythm and normal heart sounds. Exam reveals no gallop and no friction rub.  No murmur heard. Pulmonary/Chest: Effort normal and breath sounds normal. No stridor. No respiratory distress. She has no wheezes. She has no rales.  Abdominal: Soft. Bowel sounds are normal.  Neurological: She is alert and oriented to person, place, and time. She displays normal reflexes. No cranial nerve deficit or sensory deficit. She exhibits normal muscle tone.  Coordination normal.  Skin: She is not diaphoretic.  Psychiatric: She has a normal mood and affect. Her behavior is normal. Thought content normal.  Vitals reviewed.         Assessment & Plan:  Insomnia, disrupted sleep-wake cycles, habituation to benzodiazepine, hypersomnolence during the day  I am not certain if the patient is waking up with tachycardia due to a panic attack or if she may be having sleep apneic episodes.  She would be at risk for sleep apnea and I believe she needs a sleep study and to see a sleep specialist to evaluate this further.  Furthermore I believe a large component of her problem is her terrible sleep hygiene.  I recommended that she not fall asleep with the TV going in the background.  Instead I recommended that she replace this with white noise such as the sound of rain, a fan, etc.  I have recommended that if she is unable to fall asleep after 15 minutes, she gets out of the bed but does not watch TV or play with her pet.  Instead I recommended that she read in a quiet room something boring such as a magazine or a book and as soon as she starts to feel  sleepy I recommended that she go back to bed.  I would supplement the temazepam at night with amitriptyline 50 mg p.o. daily because I do believe she is habituated to the temazepam.  During the daytime we have got to try to keep her awake and more active.  I recommended that she get 1 hour a day of aerobic exercise such as swimming at the [facility] and water aerobics to spin physical energy.  I recommended that she discontinue caffeine in the afternoon several hours prior to bedtime.  I would like to see the patient back in 1 month to see if these measures of help.  Discontinue Lexapro as it has been ineffective

## 2018-06-14 ENCOUNTER — Other Ambulatory Visit: Payer: Self-pay | Admitting: Family Medicine

## 2018-06-17 ENCOUNTER — Encounter: Payer: Self-pay | Admitting: Physical Medicine & Rehabilitation

## 2018-06-17 ENCOUNTER — Encounter: Payer: Managed Care, Other (non HMO) | Attending: Physical Medicine & Rehabilitation

## 2018-06-17 ENCOUNTER — Ambulatory Visit (HOSPITAL_BASED_OUTPATIENT_CLINIC_OR_DEPARTMENT_OTHER): Payer: Managed Care, Other (non HMO) | Admitting: Physical Medicine & Rehabilitation

## 2018-06-17 VITALS — BP 123/86 | HR 89 | Resp 14 | Ht 64.0 in | Wt 206.0 lb

## 2018-06-17 DIAGNOSIS — M545 Low back pain: Secondary | ICD-10-CM | POA: Insufficient documentation

## 2018-06-17 DIAGNOSIS — G35 Multiple sclerosis: Secondary | ICD-10-CM | POA: Insufficient documentation

## 2018-06-17 DIAGNOSIS — G894 Chronic pain syndrome: Secondary | ICD-10-CM

## 2018-06-17 DIAGNOSIS — Z5181 Encounter for therapeutic drug level monitoring: Secondary | ICD-10-CM

## 2018-06-17 DIAGNOSIS — Z79891 Long term (current) use of opiate analgesic: Secondary | ICD-10-CM

## 2018-06-17 DIAGNOSIS — F17210 Nicotine dependence, cigarettes, uncomplicated: Secondary | ICD-10-CM | POA: Insufficient documentation

## 2018-06-17 NOTE — Progress Notes (Signed)
Subjective:    Patient ID: Virginia [name], female    DOB: 19-Jan-1981, 37 y.o.   MRN: [id number]  HPI 38yo Female diagnosed with MS ~2 yrs ago, has had leg cramps, numbness and tingling.  Left leg is worse than Right leg Numbness and tingling in feet and legs bilaterally  Does still have feeling in both feet Patient is ambulatory without assistive device, she is independent with all self-care and mobility. She no longer drives because she has episodes where she "passes out".  She does see electrophysiology for this. MS lesions in cervical cord at C6-7 and T1 as well as in brain, pervientricular white matter , brainstem and subcortical areas MRI T and L spine no MS  lesions Pain Inventory Average Pain 9 Pain Right Now 8 My pain is sharp, stabbing, tingling and aching  In the last 24 hours, has pain interfered with the following? General activity 8 Relation with others 0 Enjoyment of life 5 What TIME of day is your pain at its worst? na Sleep (in general) NA  Pain is worse with: bending, sitting and standing Pain improves with: heat/ice and medication Relief from Meds: 3  Mobility walk without assistance how many minutes can you walk? 5 ability to climb steps?  yes do you drive?  no  Function I need assistance with the following:  meal prep, household duties and shopping  Neuro/Psych weakness tremor tingling spasms dizziness anxiety  Prior Studies Any changes since last visit?  no  Physicians involved in your care Any changes since last visit?  no   Family History  Problem Relation Age of Onset  . Emphysema Mother   . Osteoarthritis Mother   . Other Father   . ALS Maternal Grandmother    Social History   Socioeconomic History  . Marital status: Married    Spouse name: Not on file  . Number of children: Not on file  . Years of education: Not on file  . Highest education level: Not on file  Occupational History  . Not on file  Social Needs  . Financial  resource strain: Not on file  . Food insecurity:    Worry: Not on file    Inability: Not on file  . Transportation needs:    Medical: Not on file    Non-medical: Not on file  Tobacco Use  . Smoking status: Current Every Day Smoker    Types: E-cigarettes  . Smokeless tobacco: Never Used  Substance and Sexual Activity  . Alcohol use: Yes    Alcohol/week: 0.0 standard drinks    Comment: rarely  . Drug use: No  . Sexual activity: Not on file  Lifestyle  . Physical activity:    Days per week: Not on file    Minutes per session: Not on file  . Stress: Not on file  Relationships  . Social connections:    Talks on phone: Not on file    Gets together: Not on file    Attends religious service: Not on file    Active member of club or organization: Not on file    Attends meetings of clubs or organizations: Not on file    Relationship status: Not on file  Other Topics Concern  . Not on file  Social History Narrative  . Not on file   History reviewed. No pertinent surgical history. Past Medical History:  Diagnosis Date  . Anxiety   . Headache   . Insomnia   . Multiple sclerosis (HCC)   .  Nystagmus 02/15/2017  . Syncope and collapse    BP 123/86   Pulse 82   Resp 14   Ht 5\' 4"  (1.626 m)   Wt 206 lb (93.4 kg)   SpO2 96%   BMI 35.36 kg/m   Opioid Risk Score:   Fall Risk Score:  `1  Depression screen PHQ 2/9  Depression screen [facility] 2/9 06/10/2018 01/28/2018  Decreased Interest 0 0  Down, Depressed, Hopeless 0 1  PHQ - 2 Score 0 1  Altered sleeping - 3  Tired, decreased energy - 3  Change in appetite - 0  Feeling bad or failure about yourself  - 1  Trouble concentrating - 0  Moving slowly or fidgety/restless - 0  Suicidal thoughts - 0  PHQ-9 Score - 8  Difficult doing work/chores - Somewhat difficult    Review of Systems  Constitutional: Positive for unexpected weight change.  HENT: Negative.   Eyes: Negative.   Respiratory: Negative.   Cardiovascular: Negative.    Gastrointestinal: Negative.   Endocrine: Negative.   Genitourinary: Negative.   Musculoskeletal: Positive for back pain.  Skin: Negative.   Allergic/Immunologic: Negative.   Neurological: Positive for dizziness, tremors and weakness.       Tingling  Psychiatric/Behavioral: The patient is nervous/anxious.        Objective:   Physical Exam  Constitutional: She is oriented to person, place, and time. She appears well-developed and well-nourished. No distress.  HENT:  Head: Normocephalic and atraumatic.  Eyes: Pupils are equal, round, and reactive to light. EOM are normal.  Neck: Normal range of motion.  Cardiovascular: Normal rate, regular rhythm and normal heart sounds. Exam reveals no friction rub.  No murmur heard. Pulmonary/Chest: Effort normal and breath sounds normal. No stridor. No respiratory distress. She has no wheezes. She has no rales.  Abdominal: Soft. Bowel sounds are normal. She exhibits no distension. There is no tenderness.  Musculoskeletal:  Range of motion is normal bilateral shoulders hips knees ankles elbows fingers and wrist. Cervical spine range of motion is normal Lumbar spine range of motion diminished with flexion extension lateral bending and rotation approximately 50% of normal.  Negative straight leg raising  Neurological: She is alert and oriented to person, place, and time. A sensory deficit is present. No cranial nerve deficit. She exhibits normal muscle tone. Coordination and gait normal.  Motor strength is 5/5 bilateral deltoid bicep tricep grip hip flexor knee extensor ankle dorsiflexor There is no evidence of dysmetria finger-nose-finger testing bilaterally No evidence of dysdiadochokinesis with rapid alternating supination pronation bilateral upper extremities.  Skin: Skin is warm and dry. She is not diaphoretic.  Nursing note and vitals reviewed.   diminshed Left L5-S1 sensation      Assessment & Plan:  1.  Chronic back as well as lower  extremity pain.  Her lower extremity pain appears to be the most problematic.  MRI lumbar spine showed no significant lumbar stenosis.  The disc protrusion at L5-S1 is minimal and does not deform the nerve roots. We discussed that her lower extremity discomfort is most likely related to her multiple sclerosis. We discussed need for stretching as well as strengthening of the core muscles.  Patient will start home exercise program but will be also referred to outpatient physical. I will see her back in approximately 6 weeks.  We discussed other potential pain generators in the lumbar spine including lumbar degenerative disc, lumbar facet arthropathy as well as sacroiliac joint pain. She had a bad experience with  a spinal injection, epidural.  She wants to avoid any other type of spinal injections.

## 2018-06-17 NOTE — Patient Instructions (Signed)

## 2018-06-20 ENCOUNTER — Ambulatory Visit (INDEPENDENT_AMBULATORY_CARE_PROVIDER_SITE_OTHER): Payer: Managed Care, Other (non HMO) | Admitting: Neurology

## 2018-06-20 ENCOUNTER — Encounter: Payer: Self-pay | Admitting: Neurology

## 2018-06-20 ENCOUNTER — Other Ambulatory Visit: Payer: Self-pay

## 2018-06-20 VITALS — BP 123/86 | HR 99 | Resp 16 | Ht 64.0 in | Wt 207.0 lb

## 2018-06-20 DIAGNOSIS — M545 Low back pain, unspecified: Secondary | ICD-10-CM

## 2018-06-20 DIAGNOSIS — G4700 Insomnia, unspecified: Secondary | ICD-10-CM

## 2018-06-20 DIAGNOSIS — G35D Multiple sclerosis, unspecified: Secondary | ICD-10-CM

## 2018-06-20 DIAGNOSIS — R404 Transient alteration of awareness: Secondary | ICD-10-CM | POA: Diagnosis not present

## 2018-06-20 DIAGNOSIS — R208 Other disturbances of skin sensation: Secondary | ICD-10-CM | POA: Diagnosis not present

## 2018-06-20 DIAGNOSIS — R0683 Snoring: Secondary | ICD-10-CM

## 2018-06-20 DIAGNOSIS — G4719 Other hypersomnia: Secondary | ICD-10-CM

## 2018-06-20 DIAGNOSIS — Z79899 Other long term (current) drug therapy: Secondary | ICD-10-CM

## 2018-06-20 DIAGNOSIS — G35 Multiple sclerosis: Secondary | ICD-10-CM

## 2018-06-20 DIAGNOSIS — R002 Palpitations: Secondary | ICD-10-CM

## 2018-06-20 MED ORDER — LORAZEPAM 1 MG PO TABS: 1.0000 mg | ORAL_TABLET | Freq: Three times a day (TID) | ORAL | 5 refills | Status: DC

## 2018-06-20 NOTE — Progress Notes (Signed)
[facility]  PATIENT: Virginia [name] DOB: 07-12-1981  REFERRING DOCTOR OR PCP:    Blair Heys SOURCE: patient, admit/discharge hospital notes, imaging reports, MRI's on PACS  _________________________________   HISTORICAL  CHIEF COMPLAINT:  Chief Complaint  Patient presents with  . Multiple Sclerosis    Sts. she continues to tolerate Tysabri well.  JCV ab last checked 01/22/18 and was indeterminate at 0.20.  Inhibition assay was Negative./fim    HISTORY OF PRESENT ILLNESS:  Virginia [name] is a 37 y.o. woman with multiple sclerosis who also has back pain.  Update 06/20/2018: She is on Tysabri.  She tolerates the infusions well.  Her last JCV body test was negative at 0.2 (indeterminant followed by negative inhibition assay).     She denies any new MS symptoms.   Her gait is doing a bout the same.    She is noting more numbness in her left leg.   She denies weakness.   She has urinary urgency but has no incontinence.    Vision is doing well.     She has a pulled muscle sensation in her left leg.    She sees Pain management (Dr. Wynn Banker).  An ESI did not help earlier this year.    Nothing increases or decreases her pain.      She has insomnia, both sleep onset and sleep maintenance.    Temazepam sometimes helps the sleep onset but not the sleep maintenance.      She notes spells where she feels her heart racing and she feels her eye get blurry.  These occur any time..   She did a Holter monitor and she seemed to have a VTach run.  She sees Dr. Ladona Ridgel.    She was placed on atenolol.   She does not think it has helped as she still has palpitations.     EEG 04/16/2018 was essentially normal.  There is no epileptiform activity.  There was some excess beta activity that could be due to some of the medicines that she takes.  EPWORTH SLEEPINESS SCALE  On a scale of 0 - 3 what is the chance of dozing:  Sitting and Reading:   2 Watching TV:    2 Sitting inactive in a  public place: 0 Passenger in car for one hour: 1 Lying down to rest in the afternoon: 3 Sitting and talking to someone: 1 Sitting quietly after lunch:  1 In a car, stopped in traffic:  0  Total (out of 24):    10/24 (mild)   Update 01/01/2018:  She feels that her MS has been stable. She is on Tysabri and has tolerated it well. She has not had any definite exacerbations.    She has noted fluctuating numbness in her toes and hands.   .   She has had some episodes where she feels she is having palpitations lasting minutes.    She does not know if there is any lightheadedness.     Her pulse is rapid.    This is occurring multiple times a day.    Sometimes she shakes and sometimes vision seems blurry.   This can occur in any position.     She had an ESI 4 weeks ago and it only helped slightly x 2-3 days.   She had a disc protrusion at L5-S1 that could contact the S1 nerve roots.    Pain management clinic has been contacted but she has not heard back yet.  She takes gabapentin 600 mg po qid but it has not helped her much.   She feels a little out of it at time.s    She also takes baclofen tid.      She still has a headache every day in both temples.   Moving does not change the HA.   No nausea or photophobia.      She sleeps about the same --- she has DPSD and can't sleep before 2 -3 am.   Temazepam has helped her fall asleep when she lays down. If she takes temazepam, she can't fall asleep before 2 AM.   She sometimes has light snoring.    She has episodes of waking up with a feeling of rapid heart rate but not with snorting  She has had a tremor that is a little worse in her hands. She notes it most when she tries to write or eat or hold something  Update 09/03/2017:    She feels her MS is stable. She is tolerating Tysabri well and has not had any exacerbations.  She continues to report low back pain and bilateral hip pain. She has cramping spasms in her calves. She also reports neck pain and  shoulder pain.   She has not had much benefit from gabapentin, meloxicam and Naprosyn. Hydrocodone and steroids helped just a little bit.    She takes baclofen only at night and it does not make her that sleepy.      She has headaches about 3 days a week.   Pain is bilateral in her forehead.   She has not noted any triggers and they seem to occur randomly.    Aleve helps mildly.   She denies N/V.    She has photophobia and phonophobia.    She used to only get occasional headaches.      She has neck pain that seems distinct from the headache.    She is not sleeping well many nights and has DPSD sleeping from 3 am to 11 am.   He usually wakes up for a few minutes around 7 AM and then falls back asleep  From 07/12/2017: LBP/Leg pain/nek:  She has pian all over right now but pan started in her lower back and radiates into her legs.   Pain is independent of position and OTC med's did not help.   Baclofen has not helped.    She has milder pain in her neck.   Pain is disturbing her sleep.     MRI 12/25/2016 showed that she has mild spinal stenosis at C4-C5, C5-C6 and C6-C7 due to disc protrusions and mild uncovertebral spurring that MRI also showed two MS lesions in the spinal cord, one at C6-C7 and one at T1.   Thoracic spine MRI in 2017 did not show any significant degenerative changes. Lumbar x-rays 2016 not show any significant finding.     She also noes some arm and leg weakness.  She has no no numbness bu thas hd tingling (long term form her MS).     MS:  She has been on Tysabri since March 2018 and she tolerates it well.   She did have an exacerbation (leg numbness and nystagmus) after her first dose treated with 3 days of IV Solu-Medrol.  Before Tysabri, she was on Aubagio but had breakthrough disease -- MRI 11/28/16 showed that she had about 7 new gadolinium enhancing lesions.  Most were small in the or deep white matter but one juxtacortical focus  on the left was moderate in size.   MRI of the cervical  spine February 2017 showed 2 plaques in the spinal cord, one not present in May 2017.                                                                                                      Gait/strength/sensation: Her balance is better so gait is doing better as well since starting Tysabri.   Strength is more symmetric now.     There is no facial weakness or numbness.   Bladder/bowel: She has noted mild urinary frequency and urgency but no incontinence. There is no constipation.  Vision: She denies blurry vision or diplopia now. She was told she had ocular flutters at last eye exam.     Vertigo:   She has occasional spells of vertigo similar to last visit.  Fatigue/sleep: She has both physical and cognitive fatigue.  She feels her insomnia is doing better on temazepam.   She was doing well with temazepam for her insomnia but sleep is worse recently due to her pain.  Mood/cognition: She denies depression and anxiety.      She denies any significant change in cognitive function. She notes some difficulty with memory, focus and verbal fluency.   MS History:   In mid May 2017, she was starting to experience occasional lightheadedness and a spinning vertigo. On 03/19/2016, she had more extreme vertigo and had an episode of syncope. She notices that when she was walking she would veer towards the right. Because of the syncope, she was taken to the emergency room. He had an MRI of the brain that showed several enhancing lesions, potentially worrisome for multiple sclerosis. Additional studies were performed. She received 3 days of IV steroids. The MRI's of the spine did not show any additional plaques. The lumbar puncture showed oligoclonal bands with normal IgG index, consistent with multiple sclerosis.  MRI images from 03/20/2016, 03/22/2016 and CT angiogram images from 03/23/2016 were personally reviewed. The MRI of the brain shows several 2/fair hyperintense foci, some in the periventricular and  juxtacortical white matter. Most of the foci enhanced after contrast administration. MRI of the cervical and thoracic spine did not show any spinal cord plaques and there was no significant degenerative change.   CT angiogram was essentially normal showing no significant stenosis.   She underwent a lumbar puncture on 03/21/2016. 4 oligoclonal bands present in the CSF which were not present in the serum, consistent with multiple sclerosis. The IgG index was high normal at 0.6.   There were only 4 white blood cells but 280 red blood cells more consistent with a slightly traumatic tap.    [facility] Spotted Fever CSF IgG was negative but the IgM was positive at 1.41 (less than 0.9 normal).    ANA and ANCA were both negative.    REVIEW OF SYSTEMS: Constitutional: No fevers, chills, sweats, or change in appetite.   She has fatigue. Insomnia much better with temazepam Eyes: No visual changes, double vision, eye pain Ear, nose and throat: No hearing  loss, ear pain, nasal congestion, sore throat Cardiovascular: No chest pain, palpitations Respiratory: No shortness of breath at rest or with exertion.   No wheezes GastrointestinaI: No nausea, vomiting, diarrhea, abdominal pain, fecal incontinence Genitourinary: No dysuria, urinary retention.   She reports frequency.  No nocturia. Musculoskeletal: No neck pain, back pain Integumentary: No rash, pruritus, skin lesions Neurological: as above Psychiatric: No depression at this time.  No anxiety Endocrine: No palpitations, diaphoresis, change in appetite, change in weigh or increased thirst Hematologic/Lymphatic: No anemia, purpura, petechiae. Allergic/Immunologic: No itchy/runny eyes, nasal congestion, recent allergic reactions, rashes  ALLERGIES: Allergies  Allergen Reactions  . Codeine Other (See Comments)    Hallucinations     HOME MEDICATIONS:  Current Outpatient Medications:  .  amitriptyline (ELAVIL) 50 MG tablet, Take 1 tablet  (50 mg total) by mouth at bedtime as needed for sleep., Disp: 30 tablet, Rfl: 1 .  atenolol (TENORMIN) 50 MG tablet, Take one tablet in the morning and 1/2 tablet at night, Disp: 180 tablet, Rfl: 3 .  baclofen (LIORESAL) 10 MG tablet, Take 1 tablet (10 mg total) 3 (three) times daily by mouth., Disp: 90 tablet, Rfl: 11 .  diclofenac (VOLTAREN) 75 MG EC tablet, Take 1 tablet (75 mg total) by mouth 2 (two) times daily., Disp: 60 tablet, Rfl: 1 .  gabapentin (NEURONTIN) 600 MG tablet, Take 1 tablet (600 mg total) by mouth 4 (four) times daily., Disp: 360 tablet, Rfl: 1 .  natalizumab (TYSABRI) 300 MG/15ML injection, Inject 300 mg into the vein every 28 (twenty-eight) days., Disp: , Rfl:  .  temazepam (RESTORIL) 30 MG capsule, Take 1 capsule (30 mg total) by mouth at bedtime as needed for sleep., Disp: 30 capsule, Rfl: 5  PAST MEDICAL HISTORY: Past Medical History:  Diagnosis Date  . Anxiety   . Headache   . Insomnia   . Multiple sclerosis (HCC)   . Nystagmus 02/15/2017  . Syncope and collapse     PAST SURGICAL HISTORY: History reviewed. No pertinent surgical history.  FAMILY HISTORY: Family History  Problem Relation Age of Onset  . Emphysema Mother   . Osteoarthritis Mother   . Other Father   . ALS Maternal Grandmother     SOCIAL HISTORY:  Social History   Socioeconomic History  . Marital status: Married    Spouse name: Not on file  . Number of children: Not on file  . Years of education: Not on file  . Highest education level: Not on file  Occupational History  . Not on file  Social Needs  . Financial resource strain: Not on file  . Food insecurity:    Worry: Not on file    Inability: Not on file  . Transportation needs:    Medical: Not on file    Non-medical: Not on file  Tobacco Use  . Smoking status: Current Every Day Smoker    Types: E-cigarettes  . Smokeless tobacco: Never Used  Substance and Sexual Activity  . Alcohol use: Yes    Alcohol/week: 0.0 standard  drinks    Comment: rarely  . Drug use: No  . Sexual activity: Not on file  Lifestyle  . Physical activity:    Days per week: Not on file    Minutes per session: Not on file  . Stress: Not on file  Relationships  . Social connections:    Talks on phone: Not on file    Gets together: Not on file    Attends religious service: Not  on file    Active member of club or organization: Not on file    Attends meetings of clubs or organizations: Not on file    Relationship status: Not on file  . Intimate partner violence:    Fear of current or ex partner: Not on file    Emotionally abused: Not on file    Physically abused: Not on file    Forced sexual activity: Not on file  Other Topics Concern  . Not on file  Social History Narrative  . Not on file     PHYSICAL EXAM  Vitals:   06/20/18 1415  BP: 123/86  Pulse: 99  Resp: 16  Weight: 207 lb (93.9 kg)  Height: 5\' 4"  (1.626 m)    Body mass index is 35.53 kg/m.   General: The patient is well-developed and well-nourished and in no acute distress,.  Funduscopic examination shows normal optic discs and retinal vessels.  Musculoskeletal: She has a mild tenderness over the piriformis muscles and trochanteric bursae Neurologic Exam  Mental status: The patient is alert and oriented x 3 at the time of the examination. The patient has apparent normal recent and remote memory, with an apparently normal attention span and concentration ability.   Speech is normal.  Cranial nerves: Extraocular muscles are intact.  Facial strength and sensation is normal. Trapezius strength is normal. No obvious hearing deficits are noted.  Motor:  Muscle bulk is normal.  Strength is 5/5. Tone is mildly increased in the legs.  Sensory: There is reduced sensation to touch, temperature and vibration in the left leg relative to the right leg.  The alternate touch sensation is worse in the S1 distribution in the L5 distribution of the foot.  Sensation was  normal symmetric in the arms  Coordination: Cerebellar testing reveals good finger-nose-finger and heel-to-shin bilaterally.  Gait and station: Station is normal.   Gait is normal. Tandem gait is mildly wide. Romberg is negative.   Reflexes: Deep tendon reflexes are symmetric and normal in arms.  DTRs are brisk at knees.    There is no ankle clonus.    DIAGNOSTIC DATA (LABS, IMAGING, TESTING) - I reviewed patient records, labs, notes, testing and imaging myself where available.  Lab Results  Component Value Date   WBC 8.1 01/21/2018   HGB 13.0 01/21/2018   HCT 38.8 01/21/2018   MCV 94 01/21/2018   PLT 336 01/21/2018      Component Value Date/Time   NA 137 03/19/2016 1929   K 3.7 03/19/2016 1929   CL 110 03/19/2016 1929   CO2 18 (L) 03/19/2016 1929   GLUCOSE 100 (H) 03/19/2016 1929   BUN 16 03/19/2016 1929   CREATININE 1.04 (H) 03/19/2018 1417   CALCIUM 8.9 03/19/2016 1929   PROT 6.1 12/04/2016 1131   ALBUMIN 3.7 12/04/2016 1131   AST 11 12/04/2016 1131   ALT 13 12/04/2016 1131   ALKPHOS 73 12/04/2016 1131   BILITOT <0.2 12/04/2016 1131   GFRNONAA >60 03/19/2018 1417   GFRAA >60 03/19/2018 1417    Lab Results  Component Value Date   TSH 1.010 01/01/2018       ASSESSMENT AND PLAN  Multiple sclerosis (HCC)  Dysesthesia  Spell of altered consciousness  Midline low back pain without sciatica, unspecified chronicity  Insomnia, unspecified type  Palpitations  Snoring  Excessive daytime sleepiness   1.    Continue Tysabri 300 mg every 4 weeks.  Today we will check a JCV antibody and CBC with  differential. 2.    She is woken up with a rapid heart rate.  She has gained weight and has some snoring and excessive daytime sleepiness.  Therefore we will check a home sleep study to determine if she has obstructive sleep apnea.. 3.    Will be seeing pain management for her lower back issues..   Try to stay active and exercise as tolerated.  4.    Change temazepam  to lorazepam for insomnia as it may last 1-2 hours longer.   As amitriptyline has not helped sleep or pain she may stop. 5.   Return 5-6 months.  Call sooner if there are new or worsening neurologic symptoms.  Lessie Manigo A. Epimenio Foot, MD, PhD 06/20/2018, 3:01 PM Certified in Neurology, Clinical Neurophysiology, Sleep Medicine, Pain Medicine and Neuroimaging  [facility] [address], [address] [place], [place] [address] [phone]

## 2018-06-21 ENCOUNTER — Encounter: Payer: Self-pay | Admitting: Internal Medicine

## 2018-06-21 ENCOUNTER — Ambulatory Visit (INDEPENDENT_AMBULATORY_CARE_PROVIDER_SITE_OTHER): Payer: Managed Care, Other (non HMO) | Admitting: Internal Medicine

## 2018-06-21 VITALS — BP 98/56 | HR 94 | Ht 64.0 in | Wt 206.0 lb

## 2018-06-21 DIAGNOSIS — I472 Ventricular tachycardia: Secondary | ICD-10-CM | POA: Diagnosis not present

## 2018-06-21 DIAGNOSIS — R002 Palpitations: Secondary | ICD-10-CM | POA: Diagnosis not present

## 2018-06-21 DIAGNOSIS — I4729 Other ventricular tachycardia: Secondary | ICD-10-CM

## 2018-06-21 LAB — CBC WITH DIFFERENTIAL/PLATELET
Basophils Absolute: 0.1 10*3/uL (ref 0.0–0.2)
Basos: 1 %
EOS (ABSOLUTE): 0.4 10*3/uL (ref 0.0–0.4)
EOS: 5 %
HEMATOCRIT: 37.9 % (ref 34.0–46.6)
Hemoglobin: 12.7 g/dL (ref 11.1–15.9)
Immature Grans (Abs): 0 10*3/uL (ref 0.0–0.1)
Immature Granulocytes: 0 %
LYMPHS ABS: 3.2 10*3/uL — AB (ref 0.7–3.1)
Lymphs: 41 %
MCH: 31.4 pg (ref 26.6–33.0)
MCHC: 33.5 g/dL (ref 31.5–35.7)
MCV: 94 fL (ref 79–97)
MONOS ABS: 0.8 10*3/uL (ref 0.1–0.9)
Monocytes: 10 %
Neutrophils Absolute: 3.5 10*3/uL (ref 1.4–7.0)
Neutrophils: 43 %
Platelets: 335 10*3/uL (ref 150–450)
RBC: 4.04 x10E6/uL (ref 3.77–5.28)
RDW: 13.9 % (ref 12.3–15.4)
WBC: 7.9 10*3/uL (ref 3.4–10.8)

## 2018-06-21 NOTE — Progress Notes (Signed)
HPI Virginia [name] returns today for followup of her palpitations. She has had a couple of episodes of altered mental status of no clear cut etiology. SHe is still having palpitations which can occaisionally be disabling. The patient has not had syncope. She has been taking atenolol. She admits to drinking caffeine and not sleeping well.  Allergies  Allergen Reactions  . Codeine Other (See Comments)    Hallucinations      Current Outpatient Medications  Medication Sig Dispense Refill  . amitriptyline (ELAVIL) 50 MG tablet Take 1 tablet (50 mg total) by mouth at bedtime as needed for sleep. 30 tablet 1  . atenolol (TENORMIN) 50 MG tablet Take one tablet in the morning and 1/2 tablet at night 180 tablet 3  . baclofen (LIORESAL) 10 MG tablet Take 1 tablet (10 mg total) 3 (three) times daily by mouth. 90 tablet 11  . diclofenac (VOLTAREN) 75 MG EC tablet Take 1 tablet (75 mg total) by mouth 2 (two) times daily. 60 tablet 1  . gabapentin (NEURONTIN) 600 MG tablet Take 1 tablet (600 mg total) by mouth 4 (four) times daily. 360 tablet 1  . LORazepam (ATIVAN) 1 MG tablet Take 1 tablet (1 mg total) by mouth every 8 (eight) hours. 30 tablet 5  . natalizumab (TYSABRI) 300 MG/15ML injection Inject 300 mg into the vein every 28 (twenty-eight) days.     No current facility-administered medications for this visit.      Past Medical History:  Diagnosis Date  . Anxiety   . Headache   . Insomnia   . Multiple sclerosis (HCC)   . Nystagmus 02/15/2017  . Syncope and collapse     ROS:   All systems reviewed and negative except as noted in the HPI.   History reviewed. No pertinent surgical history.   Family History  Problem Relation Age of Onset  . Emphysema Mother   . Osteoarthritis Mother   . Other Father   . ALS Maternal Grandmother      Social History   Socioeconomic History  . Marital status: Married    Spouse name: Not on file  . Number of children: Not on file  . Years of  education: Not on file  . Highest education level: Not on file  Occupational History  . Not on file  Social Needs  . Financial resource strain: Not on file  . Food insecurity:    Worry: Not on file    Inability: Not on file  . Transportation needs:    Medical: Not on file    Non-medical: Not on file  Tobacco Use  . Smoking status: Current Every Day Smoker    Types: E-cigarettes  . Smokeless tobacco: Never Used  Substance and Sexual Activity  . Alcohol use: Yes    Alcohol/week: 0.0 standard drinks    Comment: rarely  . Drug use: No  . Sexual activity: Not on file  Lifestyle  . Physical activity:    Days per week: Not on file    Minutes per session: Not on file  . Stress: Not on file  Relationships  . Social connections:    Talks on phone: Not on file    Gets together: Not on file    Attends religious service: Not on file    Active member of club or organization: Not on file    Attends meetings of clubs or organizations: Not on file    Relationship status: Not on file  .  Intimate partner violence:    Fear of current or ex partner: Not on file    Emotionally abused: Not on file    Physically abused: Not on file    Forced sexual activity: Not on file  Other Topics Concern  . Not on file  Social History Narrative  . Not on file     BP (!) 98/56   Pulse 94   Ht 5\' 4"  (1.626 m)   Wt 206 lb (93.4 kg)   SpO2 93%   BMI 35.36 kg/m   Physical Exam:  Well appearing NAD HEENT: Unremarkable Neck:  No JVD, no thyromegally Lymphatics:  No adenopathy Back:  No CVA tenderness Lungs:  Clear HEART:  Regular rate rhythm, no murmurs, no rubs, no clicks Abd:  soft, positive bowel sounds, no organomegally, no rebound, no guarding Ext:  2 plus pulses, no edema, no cyanosis, no clubbing Skin:  No rashes no nodules Neuro:  CN II through XII intact, motor grossly intact  EKG - nsr   Assess/Plan: 1. Palpitations - she has both PAC's and PVC"s. I discussed either uptitration  of her atenolol or taking flecainide. For now she would like to hold off on these meds.  I spent over 16 minutes including 50% face to face time with the patient.  Leonia Reeves.D.

## 2018-06-21 NOTE — Patient Instructions (Addendum)

## 2018-06-27 ENCOUNTER — Encounter: Payer: Self-pay | Admitting: *Deleted

## 2018-07-10 ENCOUNTER — Ambulatory Visit: Payer: Managed Care, Other (non HMO) | Admitting: Cardiovascular Disease

## 2018-07-15 ENCOUNTER — Encounter: Payer: Self-pay | Admitting: Family Medicine

## 2018-07-15 ENCOUNTER — Ambulatory Visit (INDEPENDENT_AMBULATORY_CARE_PROVIDER_SITE_OTHER): Payer: Managed Care, Other (non HMO) | Admitting: Family Medicine

## 2018-07-15 VITALS — BP 126/78 | HR 95 | Temp 98.5°F | Resp 16 | Ht 64.0 in | Wt 207.0 lb

## 2018-07-15 DIAGNOSIS — L719 Rosacea, unspecified: Secondary | ICD-10-CM | POA: Diagnosis not present

## 2018-07-15 DIAGNOSIS — R404 Transient alteration of awareness: Secondary | ICD-10-CM

## 2018-07-15 DIAGNOSIS — F419 Anxiety disorder, unspecified: Secondary | ICD-10-CM

## 2018-07-15 DIAGNOSIS — G4700 Insomnia, unspecified: Secondary | ICD-10-CM | POA: Diagnosis not present

## 2018-07-15 MED ORDER — DULOXETINE HCL 60 MG PO CPEP: 60.0000 mg | ORAL_CAPSULE | Freq: Every day | ORAL | 3 refills | Status: DC

## 2018-07-15 MED ORDER — AZELAIC ACID 20 % EX CREA: TOPICAL_CREAM | Freq: Two times a day (BID) | CUTANEOUS | 0 refills | Status: DC

## 2018-07-15 NOTE — Progress Notes (Signed)
Subjective:    Patient ID: Virginia [name], female    DOB: 12-18-1980, 37 y.o.   MRN: [id number]  HPI  03/07/18 Patient is urgently worked in today based on a message she sent me this morning.  Apparently she is having episodes of amnestic behavior.  For instance she recently woke up in bed covered and potato chips.  She has no recollection of going to bed or eating potato chips.  She also had an instance where she was playing a game on her phone.  When she woke up, she was still holding her phone.  She was standing.  However she had a pill in her mouth.  She has no recollection of taking the medication.  Her son states that yesterday, she sat down with him to watch a show on television.  She remembers sitting down to watch TV.  After that she has no memory.  Son states that she had her eyes open was staring blankly.  She seemed extremely lethargic.  She was mumbling incoherently.  He was unable to wake her up and bring her around.  These behaviors are lasting minutes to an hour.  They are associated with a period of altered consciousness, amnesia, unusual behavior, and lethargy after the associated event.  Episodes sound atypical for syncope.  I do not believe this is cardiac in nature despite her recent episode of ventricular tachycardia.  Because episodes that are witnessed, the patient has her eyes open, she is lethargic, however she is still physically interacting with her environment as demonstrated by the potato chips, taking the medication that she has no recollection of, etc.  Episodes sound like overmedication versus atypical seizure (absent seizure).  EKG is performed today and shows a normal QTc interval.  Therefore I do not believe medications are prolonging her QTc interval causing repeated episodes of ventricular tachycardia.  I do not believe beta-blockers will have this effect on an individual.  Reviewing her medication list, she is on 50 mg of imipramine, she takes 10 mg of baclofen 3 times  daily.  She takes 600 mg of gabapentin 4 times a day, she is taking temazepam 30 mg a day.  She has been on all of these medications for a prolonged period of time and has habituated to them up to this point.  At that time, my plan was: Discussed the situation with the patient.  Differential diagnosis includes overmedication versus absent (atypical) seizure's.  Other possibilities would be cardiogenic syncope, somatizations disorder.  I have recommended decreasing her medication temporarily to see if episodes persist.  I have recommended discontinuing imipramine that she takes for pain and insomnia.  I have recommended reducing the dose of baclofen to 5 mg twice daily due to its sedative effects.  I hesitate to decrease the dose of gabapentin given its antiepileptic effect in case this is in fact some type of seizure behavior.  I have also recommended decreasing her dose of temazepam by 50%.  I would like to recheck the patient on Monday to see if behavior continues.  I reached out to the patient's neurologist but he was not in the office.  His nurse states that the patient has had several EEG's and therefore they do not believe this is seizure behavior.  Therefore I was unable to schedule the patient a visit tomorrow with her neurologist.  I will however send him in a message to make him aware of the situation.  03/11/18 Patient is here today for follow-up.  Since I last saw her, she has had no further episodes of altered mental status.  She is here today with her partner.  He denies any episodes of confusion, absent staring, she denies any episodes of bizarre behavior without recollection or retrograde amnesia.  They both deny any seizure activity.  [name] is been very short since I last saw her but I am cautiously optimistic that this may be medication side effects.  Unfortunately since I last saw her, she has had several episodes of tachycardia.  Her heart will feel like it is pounding out of her chest.   Episodes can last 2 to 3 seconds.  This morning and episodes seem to go much longer.  She denies any syncope or lightheadedness.  She is currently taking atenolol 25 mg a day.  At that time, my plan was:  Patient would like to cut back on the gabapentin because of the way it makes her feel rather than the medications we held at her last visit.  I have recommended that since she has had no further episodes we make no further changes in her medications at the present time and simply monitor her over the next 1 to 2 weeks to see if these episodes continue to occur.  However, she should discuss with her neurologist cutting down on her medication if she decides this is in fact her best course of action.  Regarding her episode of tachycardia, I have recommended increasing the atenolol to 50 mg a day.  I believe that her previous dose was insufficient given her body weight.  If she continues to have episodes of tachycardia, I will contact her cardiologist to discuss further options.  05/13/18 Please see the patient's recent email.  She is here today to discuss this further.  She states 3-4 times a week, she will awaken startled, disoriented, and in a panic.  Her heart will race.  Symptoms can last minutes up to 30 minutes.  When I asked her to clarify, she admits that she is usually laying down on the couch to watch TV.  She will then pass out and awake and confused sometime later.  Sometimes she will be disoriented and talking in a confused manner.  However her husband states that she is not slurring her speech or appearing intoxicated.  Instead she is enunciating well and speaking normally but about confusing and incoherent subjects.  She is currently taking baclofen 10 mg 3 times a day for chronic low back pain in addition to gabapentin 600 mg p.o. 3 times daily for neuropathic pain.  MRI of the lumbar spine earlier this year revealed a mild small bulging disc at L5-S1 possibly touching the S1 nerve roots.   Apparently she tried an epidural steroid injection but she states that she "went crazy in the office and passed out".  At that time, my plan was: I believe that she is passing out due to medication.  I believe she then awakens confused and disoriented and has a panic attack causing her tachycardia or perceived tachycardia.  Therefore I recommended decreasing her sedating medication.  I have asked her to discontinue baclofen.  She will replace it with diclofenac 75 mg p.o. twice daily for low back pain.  We will start Lexapro 10 mg p.o. daily in an effort to try to prevent and control panic attacks and I will see the patient back in 4 weeks.  At that time I will try to decrease her dose of gabapentin.  I believe  that with less sedating medication, she will have less of these incidents.  However we will likely need to find better options for pain control possibly through a pain clinic.  06/10/18 Patient states the Lexapro was not helping at all.  She continues to awaken from sleeping in a panic with her heart racing however she is now not quite as disoriented.  Unfortunately the patient continues to use baclofen.  She is try to use less but she continues to have severe muscle spasms and she requires medication.  Her biggest concern is that she is not sleeping.  She states that she goes all night long without sleeping.  She will sometimes drift off during the day for 2 or 3 hours at a time then awaken startled in a panic slightly confused.  However she is not getting decent restorative sleep.  She goes to sleep with the TV on at night.  She needs background noise to fall asleep.  She will toss and turn for hours.  Then she gets up and watch TV or plays with her dog.  She drinks caffeine all throughout the day even up until bedtime.  She is not getting any regular aerobic exercise.  She is not doing any physical activity.  He is taking temazepam prior to bedtime and she has been taking this for 2 years but is lost  its effectiveness and now does not cause her to feel sleepy.  However she does take cat naps throughout the day due to just simply passing out from exhaustion coupled with her medication.  She states that she has to lay down during the day because her back hurts and she will easily fall asleep then.  At that time, my plan was: I am not certain if the patient is waking up with tachycardia due to a panic attack or if she may be having sleep apneic episodes.  She would be at risk for sleep apnea and I believe she needs a sleep study and to see a sleep specialist to evaluate this further.  Furthermore I believe a large component of her problem is her terrible sleep hygiene.  I recommended that she not fall asleep with the TV going in the background.  Instead I recommended that she replace this with white noise such as the sound of rain, a fan, etc.  I have recommended that if she is unable to fall asleep after 15 minutes, she gets out of the bed but does not watch TV or play with her pet.  Instead I recommended that she read in a quiet room something boring such as a magazine or a book and as soon as she starts to feel sleepy I recommended that she go back to bed.  I would supplement the temazepam at night with amitriptyline 50 mg p.o. daily because I do believe she is habituated to the temazepam.  During the daytime we have got to try to keep her awake and more active.  I recommended that she get 1 hour a day of aerobic exercise such as swimming at the [facility] and water aerobics to spin physical energy.  I recommended that she discontinue caffeine in the afternoon several hours prior to bedtime.  I would like to see the patient back in 1 month to see if these measures of help.  Discontinue Lexapro as it has been ineffective  07/15/18 Patient states that the amitriptyline has helped her sleep better.  She is also seen benefit from taking diclofenac for her  back pain.  Her pain has not been as severe recently.  However  she continues to have poor sleep.  She also continues to deal with feeling like she is going to fall asleep or pass out frequently during the day.  She is seeing her neurologist who is recommended a sleep study to evaluate for sleep apnea.  Certainly narcolepsy as a potential cause as well.  However the patient also reports feeling anxious and under tremendous stress.  She believes this is part of the reason she is not sleeping well.  I believe also that anxiety is playing a huge role in her palpitations and panic attacks.  She saw no benefit from starting Lexapro at preventing panic attacks Past Medical History:  Diagnosis Date  . Anxiety   . Headache   . Insomnia   . Multiple sclerosis (HCC)   . Nystagmus 02/15/2017  . Syncope and collapse    No past surgical history on file. Current Outpatient Medications on File Prior to Visit  Medication Sig Dispense Refill  . amitriptyline (ELAVIL) 50 MG tablet Take 1 tablet (50 mg total) by mouth at bedtime as needed for sleep. 30 tablet 1  . atenolol (TENORMIN) 50 MG tablet Take one tablet in the morning and 1/2 tablet at night 180 tablet 3  . baclofen (LIORESAL) 10 MG tablet Take 1 tablet (10 mg total) 3 (three) times daily by mouth. 90 tablet 11  . diclofenac (VOLTAREN) 75 MG EC tablet Take 1 tablet (75 mg total) by mouth 2 (two) times daily. 60 tablet 1  . gabapentin (NEURONTIN) 600 MG tablet Take 1 tablet (600 mg total) by mouth 4 (four) times daily. 360 tablet 1  . LORazepam (ATIVAN) 1 MG tablet Take 1 tablet (1 mg total) by mouth every 8 (eight) hours. 30 tablet 5  . natalizumab (TYSABRI) 300 MG/15ML injection Inject 300 mg into the vein every 28 (twenty-eight) days.     No current facility-administered medications on file prior to visit.    Allergies  Allergen Reactions  . Codeine Other (See Comments)    Hallucinations    Social History   Socioeconomic History  . Marital status: Married    Spouse name: Not on file  . Number of  children: Not on file  . Years of education: Not on file  . Highest education level: Not on file  Occupational History  . Not on file  Social Needs  . Financial resource strain: Not on file  . Food insecurity:    Worry: Not on file    Inability: Not on file  . Transportation needs:    Medical: Not on file    Non-medical: Not on file  Tobacco Use  . Smoking status: Current Every Day Smoker    Types: E-cigarettes  . Smokeless tobacco: Never Used  Substance and Sexual Activity  . Alcohol use: Yes    Alcohol/week: 0.0 standard drinks    Comment: rarely  . Drug use: No  . Sexual activity: Not on file  Lifestyle  . Physical activity:    Days per week: Not on file    Minutes per session: Not on file  . Stress: Not on file  Relationships  . Social connections:    Talks on phone: Not on file    Gets together: Not on file    Attends religious service: Not on file    Active member of club or organization: Not on file    Attends meetings of clubs or organizations:  Not on file    Relationship status: Not on file  . Intimate partner violence:    Fear of current or ex partner: Not on file    Emotionally abused: Not on file    Physically abused: Not on file    Forced sexual activity: Not on file  Other Topics Concern  . Not on file  Social History Narrative  . Not on file      Review of Systems  All other systems reviewed and are negative.      Objective:   Physical Exam  Constitutional: She is oriented to person, place, and time. She appears well-developed and well-nourished. No distress.  HENT:  Head: Normocephalic and atraumatic.  Right Ear: External ear normal.  Left Ear: External ear normal.  Nose: Nose normal.  Mouth/Throat: Oropharynx is clear and moist. No oropharyngeal exudate.  Cardiovascular: Normal rate, regular rhythm and normal heart sounds. Exam reveals no gallop and no friction rub.  No murmur heard. Pulmonary/Chest: Effort normal and breath sounds  normal. No stridor. No respiratory distress. She has no wheezes. She has no rales.  Abdominal: Soft. Bowel sounds are normal.  Neurological: She is alert and oriented to person, place, and time. She displays normal reflexes. No cranial nerve deficit or sensory deficit. She exhibits normal muscle tone. Coordination normal.  Skin: She is not diaphoretic.  Psychiatric: She has a normal mood and affect. Her behavior is normal. Thought content normal.  Vitals reviewed.   Patient also reports red dry skin on both cheeks.  It is worse in the sun.  It burns and aches after she has been in the sun.  There are no papules or pustules however there is mild erythema on both cheeks today.      Assessment & Plan:  Transient alteration of awareness  Insomnia, unspecified type  Anxiety  Rosacea I believe she may have mild rosacea.  Lupus would be less likely.  Begin azelaic acid 20% cream applied in a thin film twice a day to the affected area and reassess in 4 weeks.  I also believe that the patient is dealing with panic attacks coupled with insomnia and poor sleep hygiene.  I am interested to see the results of the sleep study regarding possible sleep apnea.  If we can treat the patient effectively and get her to sleep better at night, I believe some of her symptoms would improve.  I would also be interested to see if there is any evidence of narcolepsy that may require treatment.  If her sleep study is normal, I would focus on treating anxiety to help prevent panic attacks.  Therefore I recommended adding Cymbalta 60 mg a day and reassess in 4 weeks

## 2018-07-16 ENCOUNTER — Other Ambulatory Visit: Payer: Self-pay | Admitting: Family Medicine

## 2018-07-17 ENCOUNTER — Encounter: Payer: Self-pay | Admitting: Neurology

## 2018-07-17 ENCOUNTER — Encounter: Payer: Managed Care, Other (non HMO) | Admitting: Neurology

## 2018-07-20 ENCOUNTER — Other Ambulatory Visit: Payer: Self-pay | Admitting: Neurology

## 2018-07-21 ENCOUNTER — Other Ambulatory Visit: Payer: Self-pay | Admitting: Family Medicine

## 2018-07-29 ENCOUNTER — Encounter: Payer: Managed Care, Other (non HMO) | Attending: Physical Medicine & Rehabilitation

## 2018-07-29 ENCOUNTER — Encounter: Payer: Self-pay | Admitting: Physical Medicine & Rehabilitation

## 2018-07-29 ENCOUNTER — Ambulatory Visit (HOSPITAL_BASED_OUTPATIENT_CLINIC_OR_DEPARTMENT_OTHER): Payer: Managed Care, Other (non HMO) | Admitting: Physical Medicine & Rehabilitation

## 2018-07-29 VITALS — BP 114/80 | HR 76 | Ht 64.0 in | Wt 205.0 lb

## 2018-07-29 DIAGNOSIS — F17210 Nicotine dependence, cigarettes, uncomplicated: Secondary | ICD-10-CM | POA: Insufficient documentation

## 2018-07-29 DIAGNOSIS — M5441 Lumbago with sciatica, right side: Secondary | ICD-10-CM | POA: Diagnosis not present

## 2018-07-29 DIAGNOSIS — M545 Low back pain: Secondary | ICD-10-CM | POA: Insufficient documentation

## 2018-07-29 DIAGNOSIS — G35 Multiple sclerosis: Secondary | ICD-10-CM

## 2018-07-29 DIAGNOSIS — G894 Chronic pain syndrome: Secondary | ICD-10-CM | POA: Insufficient documentation

## 2018-07-29 DIAGNOSIS — G8929 Other chronic pain: Secondary | ICD-10-CM | POA: Diagnosis not present

## 2018-07-29 NOTE — Patient Instructions (Signed)

## 2018-07-29 NOTE — Progress Notes (Signed)
Subjective:    Patient ID: Virginia [name], female    DOB: August 02, 1981, 37 y.o.   MRN: [id number]  HPI Sees Dr Paschal Dopp, recently had sleep study at home.  Patient is accompanied by her mother today. Just Starting Cymbalta 60 mg daily Takes lorazepam 1mg  qhs- for sleep, she is receiving 90 tablets/month Baclofen 10mg  TID for leg cramps Mod I dressing and bathing , amb without AD No falls at home  Pain Inventory Average Pain 7 Pain Right Now 1 My pain is intermittent and stabbing  In the last 24 hours, has pain interfered with the following? General activity 1 Relation with others 1 Enjoyment of life 1 What TIME of day is your pain at its worst? varies Sleep (in general) Poor  Pain is worse with: bending, sitting, inactivity and standing Pain improves with: medication Relief from Meds: 6  Mobility walk without assistance ability to climb steps?  yes do you drive?  no Do you have any goals in this area?  yes  Function not employed: date last employed . Do you have any goals in this area?  no  Neuro/Psych weakness numbness tremor tingling dizziness anxiety  Prior Studies Any changes since last visit?  no  Physicians involved in your care Any changes since last visit?  no   Family History  Problem Relation Age of Onset  . Emphysema Mother   . Osteoarthritis Mother   . Other Father   . ALS Maternal Grandmother    Social History   Socioeconomic History  . Marital status: Married    Spouse name: Not on file  . Number of children: Not on file  . Years of education: Not on file  . Highest education level: Not on file  Occupational History  . Not on file  Social Needs  . Financial resource strain: Not on file  . Food insecurity:    Worry: Not on file    Inability: Not on file  . Transportation needs:    Medical: Not on file    Non-medical: Not on file  Tobacco Use  . Smoking status: Current Every Day Smoker    Types: E-cigarettes  . Smokeless  tobacco: Never Used  Substance and Sexual Activity  . Alcohol use: Yes    Alcohol/week: 0.0 standard drinks    Comment: rarely  . Drug use: No  . Sexual activity: Not on file  Lifestyle  . Physical activity:    Days per week: Not on file    Minutes per session: Not on file  . Stress: Not on file  Relationships  . Social connections:    Talks on phone: Not on file    Gets together: Not on file    Attends religious service: Not on file    Active member of club or organization: Not on file    Attends meetings of clubs or organizations: Not on file    Relationship status: Not on file  Other Topics Concern  . Not on file  Social History Narrative  . Not on file   History reviewed. No pertinent surgical history. Past Medical History:  Diagnosis Date  . Anxiety   . Headache   . Insomnia   . Multiple sclerosis (HCC)   . Nystagmus 02/15/2017  . Syncope and collapse    BP 114/80 (BP Location: Left Arm, Patient Position: Sitting, Cuff Size: Normal)   Pulse 76   Ht 5\' 4"  (1.626 m)   Wt 205 lb (93 kg)  SpO2 94%   BMI 35.19 kg/m   Opioid Risk Score:   Fall Risk Score:  `1  Depression screen PHQ 2/9  Depression screen [facility] 2/9 07/15/2018 06/10/2018 01/28/2018  Decreased Interest 0 0 0  Down, Depressed, Hopeless 0 0 1  PHQ - 2 Score 0 0 1  Altered sleeping - - 3  Tired, decreased energy - - 3  Change in appetite - - 0  Feeling bad or failure about yourself  - - 1  Trouble concentrating - - 0  Moving slowly or fidgety/restless - - 0  Suicidal thoughts - - 0  PHQ-9 Score - - 8  Difficult doing work/chores - - Somewhat difficult    Review of Systems  Constitutional: Negative.   HENT: Negative.   Eyes: Negative.   Respiratory: Negative.   Cardiovascular: Negative.   Gastrointestinal: Negative.   Endocrine: Negative.   Genitourinary: Negative.   Musculoskeletal: Positive for back pain.  Skin: Negative.   Allergic/Immunologic: Negative.   Neurological: Positive for  dizziness, tremors, weakness and numbness.       Tingling  Psychiatric/Behavioral: The patient is nervous/anxious.   All other systems reviewed and are negative.      Objective:   Physical Exam  Constitutional: She is oriented to person, place, and time. She appears well-developed and well-nourished. No distress.  HENT:  Head: Normocephalic and atraumatic.  Eyes: Pupils are equal, round, and reactive to light. EOM are normal.  Neck: Normal range of motion.  Neurological: She is alert and oriented to person, place, and time.  Motor strength is 5/5 bilateral deltoid, bicep, tricep, grip, hip flexor, knee extensor, ankle dorsiflexor and plantar flexor Finger-nose-finger testing shows no evidence dysmetria No evidence of dysdiadochokinesis with rapid naming supination pronation bilateral upper limbs Ambulates without assistive device no toe drag or knee instability she is able to Toe Walk and Heel Walk.  Skin: Skin is warm and dry. She is not diaphoretic.  Psychiatric: She has a normal mood and affect. Her behavior is normal. Judgment and thought content normal.  Nursing note and vitals reviewed.   Reduced sensation, pp in left L2,3,4,5,S1     Assessment & Plan:  1.  Multiple sclerosis she does have some chronic lower extremity pain.  Her MRI lumbar spine did not show any compressive lesions.  At this point she has recently been started on Cymbalta 60 mg a great day which I think is appropriate for her lower extremity symptoms. She continues on lorazepam 1 mg at night but does not take it during the day, she is getting a prescription for 90 tablets/month.  As prescribed by neurology sent note to review this. #2.  Chronic low back pain She never heard from physical therapy I will write another order for this I do think would be helpful for lower extremity strengthening and core strengthening balance.

## 2018-08-06 ENCOUNTER — Other Ambulatory Visit: Payer: Self-pay | Admitting: Family Medicine

## 2018-08-07 ENCOUNTER — Ambulatory Visit (INDEPENDENT_AMBULATORY_CARE_PROVIDER_SITE_OTHER): Payer: Managed Care, Other (non HMO) | Admitting: Neurology

## 2018-08-07 DIAGNOSIS — R0683 Snoring: Secondary | ICD-10-CM

## 2018-08-07 DIAGNOSIS — G4710 Hypersomnia, unspecified: Secondary | ICD-10-CM | POA: Diagnosis not present

## 2018-08-07 DIAGNOSIS — G4719 Other hypersomnia: Secondary | ICD-10-CM

## 2018-08-12 ENCOUNTER — Encounter: Payer: Self-pay | Admitting: *Deleted

## 2018-08-12 NOTE — Progress Notes (Signed)
   [facility]  Home sleep study   STUDY DATE: 08/07/2017-08/08/2017 PATIENT NAME: Virginia [name] DOB: 04-Oct-1981 MRN: [id number]  ORDERING CLINICIAN: Richard A. Sater, MD. PhD  CLINICAL INFORMATION: 37 year old woman with snoring and excessive daytime sleepiness RECORDING TIME: 9 hours 8 minutes SLEEP TIME: 7 hours 57 minutes Percent REM: 24.9% Total time below SaO2 of 88% = 0 minutes  pRDI = 5.7/hr pAHI = 5.1/hr  IMPRESSION:  1.    Borderline obstructive sleep apnea with AHI equals 5.1.  More OSA was noted during REM sleep (REM pAHI = 9.8)  PLAN: Consider weight loss or an oral appliance to treat the snoring with borderline obstructive sleep apnea.   INTERPRETING PHYSICIAN:   Richard A. Epimenio Foot, MD, PhD, [facility] Certified in Neurology, Clinical Neurophysiology, Sleep Medicine, Pain Medicine and Neuroimaging  [facility] [address] [place], [place] [address] [phone]

## 2018-08-14 ENCOUNTER — Institutional Professional Consult (permissible substitution): Payer: Managed Care, Other (non HMO) | Admitting: Neurology

## 2018-08-30 ENCOUNTER — Encounter: Payer: Self-pay | Admitting: Family Medicine

## 2018-09-08 ENCOUNTER — Other Ambulatory Visit: Payer: Self-pay | Admitting: Neurology

## 2018-09-14 ENCOUNTER — Other Ambulatory Visit: Payer: Self-pay | Admitting: Family Medicine

## 2018-09-18 ENCOUNTER — Encounter: Payer: Self-pay | Admitting: Family Medicine

## 2018-09-23 ENCOUNTER — Ambulatory Visit: Payer: Managed Care, Other (non HMO) | Admitting: Physical Medicine & Rehabilitation

## 2018-09-23 ENCOUNTER — Encounter: Payer: Managed Care, Other (non HMO) | Attending: Physical Medicine & Rehabilitation

## 2018-09-23 DIAGNOSIS — F17210 Nicotine dependence, cigarettes, uncomplicated: Secondary | ICD-10-CM | POA: Insufficient documentation

## 2018-09-23 DIAGNOSIS — G894 Chronic pain syndrome: Secondary | ICD-10-CM | POA: Insufficient documentation

## 2018-09-23 DIAGNOSIS — M545 Low back pain: Secondary | ICD-10-CM | POA: Insufficient documentation

## 2018-09-23 DIAGNOSIS — G35 Multiple sclerosis: Secondary | ICD-10-CM | POA: Insufficient documentation

## 2018-09-28 ENCOUNTER — Other Ambulatory Visit: Payer: Self-pay | Admitting: Family Medicine

## 2018-09-30 ENCOUNTER — Encounter: Payer: Self-pay | Admitting: Family Medicine

## 2018-10-08 ENCOUNTER — Encounter: Payer: Self-pay | Admitting: Family Medicine

## 2018-10-08 ENCOUNTER — Ambulatory Visit (INDEPENDENT_AMBULATORY_CARE_PROVIDER_SITE_OTHER): Payer: Managed Care, Other (non HMO) | Admitting: Family Medicine

## 2018-10-08 VITALS — BP 110/68 | HR 112 | Temp 98.8°F | Resp 18 | Ht 64.0 in | Wt 212.0 lb

## 2018-10-08 DIAGNOSIS — K219 Gastro-esophageal reflux disease without esophagitis: Secondary | ICD-10-CM | POA: Diagnosis not present

## 2018-10-08 DIAGNOSIS — F411 Generalized anxiety disorder: Secondary | ICD-10-CM | POA: Diagnosis not present

## 2018-10-08 MED ORDER — PANTOPRAZOLE SODIUM 40 MG PO TBEC: 40.0000 mg | DELAYED_RELEASE_TABLET | Freq: Two times a day (BID) | ORAL | 3 refills | Status: DC

## 2018-10-08 MED ORDER — CLONAZEPAM 1 MG PO TABS: 1.0000 mg | ORAL_TABLET | Freq: Two times a day (BID) | ORAL | 1 refills | Status: DC | PRN

## 2018-10-08 NOTE — Progress Notes (Signed)
Subjective:    Patient ID: Virginia [name], female    DOB: 12-18-1980, 37 y.o.   MRN: [id number]  HPI  03/07/18 Patient is urgently worked in today based on a message she sent me this morning.  Apparently she is having episodes of amnestic behavior.  For instance she recently woke up in bed covered and potato chips.  She has no recollection of going to bed or eating potato chips.  She also had an instance where she was playing a game on her phone.  When she woke up, she was still holding her phone.  She was standing.  However she had a pill in her mouth.  She has no recollection of taking the medication.  Her son states that yesterday, she sat down with him to watch a show on television.  She remembers sitting down to watch TV.  After that she has no memory.  Son states that she had her eyes open was staring blankly.  She seemed extremely lethargic.  She was mumbling incoherently.  He was unable to wake her up and bring her around.  These behaviors are lasting minutes to an hour.  They are associated with a period of altered consciousness, amnesia, unusual behavior, and lethargy after the associated event.  Episodes sound atypical for syncope.  I do not believe this is cardiac in nature despite her recent episode of ventricular tachycardia.  Because episodes that are witnessed, the patient has her eyes open, she is lethargic, however she is still physically interacting with her environment as demonstrated by the potato chips, taking the medication that she has no recollection of, etc.  Episodes sound like overmedication versus atypical seizure (absent seizure).  EKG is performed today and shows a normal QTc interval.  Therefore I do not believe medications are prolonging her QTc interval causing repeated episodes of ventricular tachycardia.  I do not believe beta-blockers will have this effect on an individual.  Reviewing her medication list, she is on 50 mg of imipramine, she takes 10 mg of baclofen 3 times  daily.  She takes 600 mg of gabapentin 4 times a day, she is taking temazepam 30 mg a day.  She has been on all of these medications for a prolonged period of time and has habituated to them up to this point.  At that time, my plan was: Discussed the situation with the patient.  Differential diagnosis includes overmedication versus absent (atypical) seizure's.  Other possibilities would be cardiogenic syncope, somatizations disorder.  I have recommended decreasing her medication temporarily to see if episodes persist.  I have recommended discontinuing imipramine that she takes for pain and insomnia.  I have recommended reducing the dose of baclofen to 5 mg twice daily due to its sedative effects.  I hesitate to decrease the dose of gabapentin given its antiepileptic effect in case this is in fact some type of seizure behavior.  I have also recommended decreasing her dose of temazepam by 50%.  I would like to recheck the patient on Monday to see if behavior continues.  I reached out to the patient's neurologist but he was not in the office.  His nurse states that the patient has had several EEG's and therefore they do not believe this is seizure behavior.  Therefore I was unable to schedule the patient a visit tomorrow with her neurologist.  I will however send him in a message to make him aware of the situation.  03/11/18 Patient is here today for follow-up.  Since I last saw her, she has had no further episodes of altered mental status.  She is here today with her partner.  He denies any episodes of confusion, absent staring, she denies any episodes of bizarre behavior without recollection or retrograde amnesia.  They both deny any seizure activity.  Virginia [name] is been very short since I last saw her but I am cautiously optimistic that this may be medication side effects.  Unfortunately since I last saw her, she has had several episodes of tachycardia.  Her heart will feel like it is pounding out of her chest.   Episodes can last 2 to 3 seconds.  This morning and episodes seem to go much longer.  She denies any syncope or lightheadedness.  She is currently taking atenolol 25 mg a day.  At that time, my plan was:  Patient would like to cut back on the gabapentin because of the way it makes her feel rather than the medications we held at her last visit.  I have recommended that since she has had no further episodes we make no further changes in her medications at the present time and simply monitor her over the next 1 to 2 weeks to see if these episodes continue to occur.  However, she should discuss with her neurologist cutting down on her medication if she decides this is in fact her best course of action.  Regarding her episode of tachycardia, I have recommended increasing the atenolol to 50 mg a day.  I believe that her previous dose was insufficient given her body weight.  If she continues to have episodes of tachycardia, I will contact her cardiologist to discuss further options.  05/13/18 Please see the patient's recent email.  She is here today to discuss this further.  She states 3-4 times a week, she will awaken startled, disoriented, and in a panic.  Her heart will race.  Symptoms can last minutes up to 30 minutes.  When I asked her to clarify, she admits that she is usually laying down on the couch to watch TV.  She will then pass out and awake and confused sometime later.  Sometimes she will be disoriented and talking in a confused manner.  However her husband states that she is not slurring her speech or appearing intoxicated.  Instead she is enunciating well and speaking normally but about confusing and incoherent subjects.  She is currently taking baclofen 10 mg 3 times a day for chronic low back pain in addition to gabapentin 600 mg p.o. 3 times daily for neuropathic pain.  MRI of the lumbar spine earlier this year revealed a mild small bulging disc at L5-S1 possibly touching the S1 nerve roots.   Apparently she tried an epidural steroid injection but she states that she "went crazy in the office and passed out".  At that time, my plan was: I believe that she is passing out due to medication.  I believe she then awakens confused and disoriented and has a panic attack causing her tachycardia or perceived tachycardia.  Therefore I recommended decreasing her sedating medication.  I have asked her to discontinue baclofen.  She will replace it with diclofenac 75 mg p.o. twice daily for low back pain.  We will start Lexapro 10 mg p.o. daily in an effort to try to prevent and control panic attacks and I will see the patient back in 4 weeks.  At that time I will try to decrease her dose of gabapentin.  I believe  that with less sedating medication, she will have less of these incidents.  However we will likely need to find better options for pain control possibly through a pain clinic.  06/10/18 Patient states the Lexapro was not helping at all.  She continues to awaken from sleeping in a panic with her heart racing however she is now not quite as disoriented.  Unfortunately the patient continues to use baclofen.  She is try to use less but she continues to have severe muscle spasms and she requires medication.  Her biggest concern is that she is not sleeping.  She states that she goes all night long without sleeping.  She will sometimes drift off during the day for 2 or 3 hours at a time then awaken startled in a panic slightly confused.  However she is not getting decent restorative sleep.  She goes to sleep with the TV on at night.  She needs background noise to fall asleep.  She will toss and turn for hours.  Then she gets up and watch TV or plays with her dog.  She drinks caffeine all throughout the day even up until bedtime.  She is not getting any regular aerobic exercise.  She is not doing any physical activity.  He is taking temazepam prior to bedtime and she has been taking this for 2 years but is lost  its effectiveness and now does not cause her to feel sleepy.  However she does take cat naps throughout the day due to just simply passing out from exhaustion coupled with her medication.  She states that she has to lay down during the day because her back hurts and she will easily fall asleep then.  At that time, my plan was: I am not certain if the patient is waking up with tachycardia due to a panic attack or if she may be having sleep apneic episodes.  She would be at risk for sleep apnea and I believe she needs a sleep study and to see a sleep specialist to evaluate this further.  Furthermore I believe a large component of her problem is her terrible sleep hygiene.  I recommended that she not fall asleep with the TV going in the background.  Instead I recommended that she replace this with white noise such as the sound of rain, a fan, etc.  I have recommended that if she is unable to fall asleep after 15 minutes, she gets out of the bed but does not watch TV or play with her pet.  Instead I recommended that she read in a quiet room something boring such as a magazine or a book and as soon as she starts to feel sleepy I recommended that she go back to bed.  I would supplement the temazepam at night with amitriptyline 50 mg p.o. daily because I do believe she is habituated to the temazepam.  During the daytime we have got to try to keep her awake and more active.  I recommended that she get 1 hour a day of aerobic exercise such as swimming at the [facility] and water aerobics to spin physical energy.  I recommended that she discontinue caffeine in the afternoon several hours prior to bedtime.  I would like to see the patient back in 1 month to see if these measures of help.  Discontinue Lexapro as it has been ineffective  07/15/18 Patient states that the amitriptyline has helped her sleep better.  She is also seen benefit from taking diclofenac for her  back pain.  Her pain has not been as severe recently.  However  she continues to have poor sleep.  She also continues to deal with feeling like she is going to fall asleep or pass out frequently during the day.  She is seeing her neurologist who is recommended a sleep study to evaluate for sleep apnea.  Certainly narcolepsy as a potential cause as well.  However the patient also reports feeling anxious and under tremendous stress.  She believes this is part of the reason she is not sleeping well.  I believe also that anxiety is playing a huge role in her palpitations and panic attacks.  She saw no benefit from starting Lexapro at preventing panic attacks.  At that time, my plan was: I believe she may have mild rosacea.  Lupus would be less likely.  Begin azelaic acid 20% cream applied in a thin film twice a day to the affected area and reassess in 4 weeks.  I also believe that the patient is dealing with panic attacks coupled with insomnia and poor sleep hygiene.  I am interested to see the results of the sleep study regarding possible sleep apnea.  If we can treat the patient effectively and get her to sleep better at night, I believe some of her symptoms would improve.  I would also be interested to see if there is any evidence of narcolepsy that may require treatment.  If her sleep study is normal, I would focus on treating anxiety to help prevent panic attacks.  Therefore I recommended adding Cymbalta 60 mg a day and reassess in 4 weeks  10/08/18 Home sleep study revealed mild borderline sleep apnea that was not severe enough to treat with CPAP.  Patient had another episode recently where she fell asleep at night and woke up choking triggering a panic attack leaving her feeling shaky and extremely anxious for several hours.  She reports panic attacks all throughout the day.  She is taking lorazepam at night to help her sleep however she is only using Cymbalta during the day and is seen improvement in depression but no improvement in her anxiety.  Furthermore she reports  breakthrough acid reflux despite taking Nexium 20 mg a day for the last 3 weeks.  She reports reflux and indigestion.  This is worse at night when she lies down.  She denies any melena or hematochezia. Past Medical History:  Diagnosis Date  . Anxiety   . Headache   . Insomnia   . Multiple sclerosis (HCC)   . Nystagmus 02/15/2017  . Syncope and collapse    No past surgical history on file. Current Outpatient Medications on File Prior to Visit  Medication Sig Dispense Refill  . amitriptyline (ELAVIL) 50 MG tablet TAKE 1 TABLET (50 MG TOTAL) BY MOUTH AT BEDTIME AS NEEDED FOR SLEEP. 90 tablet 2  . atenolol (TENORMIN) 50 MG tablet Take one tablet in the morning and 1/2 tablet at night 180 tablet 3  . atenolol (TENORMIN) 50 MG tablet TAKE 1 TABLET BY MOUTH EVERY DAY 90 tablet 3  . azelaic acid (AZELEX) 20 % cream Apply topically 2 (two) times daily. 30 g 0  . baclofen (LIORESAL) 10 MG tablet TAKE 1 TABLET (10 MG TOTAL) 3 (THREE) TIMES DAILY BY MOUTH. 90 tablet 11  . diclofenac (VOLTAREN) 75 MG EC tablet TAKE 1 TABLET BY MOUTH TWICE A DAY 60 tablet 1  . DULoxetine (CYMBALTA) 60 MG capsule TAKE 1 CAPSULE BY MOUTH EVERY DAY 90  capsule 2  . gabapentin (NEURONTIN) 600 MG tablet TAKE 1 TABLET (600 MG TOTAL) BY MOUTH 4 (FOUR) TIMES DAILY. 120 tablet 5  . LORazepam (ATIVAN) 1 MG tablet Take 1 tablet (1 mg total) by mouth every 8 (eight) hours. 30 tablet 5  . natalizumab (TYSABRI) 300 MG/15ML injection Inject 300 mg into the vein every 28 (twenty-eight) days.     No current facility-administered medications on file prior to visit.    Allergies  Allergen Reactions  . Codeine Other (See Comments)    Hallucinations    Social History   Socioeconomic History  . Marital status: Married    Spouse name: Not on file  . Number of children: Not on file  . Years of education: Not on file  . Highest education level: Not on file  Occupational History  . Not on file  Social Needs  . Financial resource  strain: Not on file  . Food insecurity:    Worry: Not on file    Inability: Not on file  . Transportation needs:    Medical: Not on file    Non-medical: Not on file  Tobacco Use  . Smoking status: Current Every Day Smoker    Types: E-cigarettes  . Smokeless tobacco: Never Used  Substance and Sexual Activity  . Alcohol use: Yes    Alcohol/week: 0.0 standard drinks    Comment: rarely  . Drug use: No  . Sexual activity: Not on file  Lifestyle  . Physical activity:    Days per week: Not on file    Minutes per session: Not on file  . Stress: Not on file  Relationships  . Social connections:    Talks on phone: Not on file    Gets together: Not on file    Attends religious service: Not on file    Active member of club or organization: Not on file    Attends meetings of clubs or organizations: Not on file    Relationship status: Not on file  . Intimate partner violence:    Fear of current or ex partner: Not on file    Emotionally abused: Not on file    Physically abused: Not on file    Forced sexual activity: Not on file  Other Topics Concern  . Not on file  Social History Narrative  . Not on file      Review of Systems  All other systems reviewed and are negative.      Objective:   Physical Exam  Constitutional: She is oriented to person, place, and time. She appears well-developed and well-nourished. No distress.  HENT:  Head: Normocephalic and atraumatic.  Right Ear: External ear normal.  Left Ear: External ear normal.  Nose: Nose normal.  Mouth/Throat: Oropharynx is clear and moist. No oropharyngeal exudate.  Cardiovascular: Normal rate, regular rhythm and normal heart sounds. Exam reveals no gallop and no friction rub.  No murmur heard. Pulmonary/Chest: Effort normal and breath sounds normal. No stridor. No respiratory distress. She has no wheezes. She has no rales.  Abdominal: Soft. Bowel sounds are normal.  Neurological: She is alert and oriented to person,  place, and time. She displays normal reflexes. No cranial nerve deficit or sensory deficit. She exhibits normal muscle tone. Coordination normal.  Skin: She is not diaphoretic.  Psychiatric: She has a normal mood and affect. Her behavior is normal. Thought content normal.  Vitals reviewed.      Assessment & Plan:   GAD (generalized anxiety disorder) -  Plan: clonazePAM (KLONOPIN) 1 MG tablet  Discontinue lorazepam and begin Klonopin 1 mg p.o. twice daily on a scheduled basis during the day to better manage her anxiety and recheck in 3 weeks.  Continue Cymbalta.  Recommended that she elevate the head of her bed.  Recommend that she discontinue Nexium.  Recommend that she start Protonix 40 mg twice daily and then reassess in 3 weeks

## 2018-10-20 ENCOUNTER — Other Ambulatory Visit: Payer: Self-pay | Admitting: Neurology

## 2018-11-04 ENCOUNTER — Telehealth: Payer: Self-pay | Admitting: Neurology

## 2018-11-04 NOTE — Telephone Encounter (Signed)
I called Virginia [name] about the numbness that she has been experiencing.  I got her voicemail and left a message.  I will try to call again tomorrow.

## 2018-11-05 NOTE — Telephone Encounter (Signed)
I spoke to Virginia [name] about the numbness.  She gets tingling in the left greater than right legs frequently.  This occurs daily and she is experiencing some right now.  She had the one episode where she became completely numb in the left foot and when she stood up she fell.  This has not happened again since then.  Most likely, she was just having pressure on the nerve when the numbness occurred.  The tingling could be from her MS.  I would not make any changes in medications at this point.  She should continue the gabapentin.  She is scheduled to follow-up with me again in about 1 month.

## 2018-11-06 ENCOUNTER — Encounter: Payer: Self-pay | Admitting: Family Medicine

## 2018-11-08 ENCOUNTER — Ambulatory Visit (INDEPENDENT_AMBULATORY_CARE_PROVIDER_SITE_OTHER): Payer: BLUE CROSS/BLUE SHIELD | Admitting: Family Medicine

## 2018-11-08 ENCOUNTER — Encounter: Payer: Self-pay | Admitting: Family Medicine

## 2018-11-08 ENCOUNTER — Ambulatory Visit
Admission: RE | Admit: 2018-11-08 | Discharge: 2018-11-08 | Disposition: A | Discharge status: Home / Self Care | Payer: BLUE CROSS/BLUE SHIELD | Source: Ambulatory Visit | Attending: Family Medicine | Admitting: Family Medicine

## 2018-11-08 VITALS — BP 124/86 | HR 106 | Temp 98.0°F | Resp 15 | Ht 64.0 in | Wt 214.2 lb

## 2018-11-08 DIAGNOSIS — M79672 Pain in left foot: Secondary | ICD-10-CM

## 2018-11-08 DIAGNOSIS — S99922A Unspecified injury of left foot, initial encounter: Secondary | ICD-10-CM | POA: Diagnosis not present

## 2018-11-08 DIAGNOSIS — M25572 Pain in left ankle and joints of left foot: Secondary | ICD-10-CM

## 2018-11-08 DIAGNOSIS — W19XXXA Unspecified fall, initial encounter: Secondary | ICD-10-CM

## 2018-11-08 DIAGNOSIS — S99912A Unspecified injury of left ankle, initial encounter: Secondary | ICD-10-CM | POA: Diagnosis not present

## 2018-11-08 DIAGNOSIS — M25472 Effusion, left ankle: Secondary | ICD-10-CM | POA: Diagnosis not present

## 2018-11-08 DIAGNOSIS — M7989 Other specified soft tissue disorders: Secondary | ICD-10-CM | POA: Diagnosis not present

## 2018-11-08 NOTE — Patient Instructions (Addendum)
Go to [facility] for xrays - I will call you with result and plan after that.    Continue to rest, ice elevate and compression as tolerated with pain.     Ankle Sprain  An ankle sprain is a stretch or tear in a ligament in the ankle. Ligaments are tissues that connect bones to each other. The two most common types of ankle sprains are:  Inversion sprain. This happens when the foot turns inward and the ankle rolls outward. It affects the ligament on the outside of the foot (lateral ligament).  Eversion sprain. This happens when the foot turns outward and the ankle rolls inward. It affects the ligament on the inner side of the foot (medial ligament). What are the causes? This condition is often caused by accidentally rolling or twisting the ankle. What increases the risk? You are more likely to develop this condition if you play sports. What are the signs or symptoms? Symptoms of this condition include:  Pain in your ankle.  Swelling.  Bruising. This may develop right after you sprain your ankle or 1-2 days later.  Trouble standing or walking, especially when you turn or change directions. How is this diagnosed? This condition is diagnosed with:  A physical exam. During the exam, your health care provider will press on certain parts of your foot and ankle and try to move them in certain ways.  X-ray imaging. These may be taken to see how severe the sprain is and to check for broken bones. How is this treated? This condition may be treated with:  A brace or splint. This is used to keep the ankle from moving until it heals.  An elastic bandage. This is used to support the ankle.  Crutches.  Pain medicine.  Surgery. This may be needed if the sprain is severe.  Physical therapy. This may help to improve the range of motion in the ankle. Follow these instructions at home: If you have a brace or a splint:  Wear the brace or splint as told by your health care  provider. Remove it only as told by your health care provider.  Loosen the brace or splint if your toes tingle, become numb, or turn cold and blue.  Keep the brace or splint clean.  If the brace or splint is not waterproof: ? Do not let it get wet. ? Cover it with a watertight covering when you take a bath or a shower. If you have an elastic bandage (dressing):  Remove it to shower or bathe.  Try not to move your ankle much, but wiggle your toes from time to time. This helps to prevent swelling.  Adjust the dressing to make it more comfortable if it feels too tight.  Loosen the dressing if you have numbness or tingling in your foot, or if your foot becomes cold and blue. Managing pain, stiffness, and swelling   Take over-the-counter and prescription medicines only as told by your health care provider.  For 2-3 days, keep your ankle raised (elevated) above the level of your heart as much as possible.  If directed, put ice on the injured area: ? If you have a removable brace or splint, remove it as told by your health care provider. ? Put ice in a plastic bag. ? Place a towel between your skin and the bag. ? Leave the ice on for 20 minutes, 2-3 times a day. General instructions  Rest your ankle.  Do not use the injured limb to  support your body weight until your health care provider says that you can. Use crutches as told by your health care provider.  Do not use any products that contain nicotine or tobacco, such as cigarettes, e-cigarettes, and chewing tobacco. If you need help quitting, ask your health care provider.  Keep all follow-up visits as told by your health care provider. This is important. Contact a health care provider if:  You have rapidly increasing bruising or swelling.  Your pain is not relieved with medicine. Get help right away if:  Your foot or toes become numb or blue.  You have severe pain that gets worse. Summary  An ankle sprain is a stretch  or tear in a ligament in the ankle. Ligaments are tissues that connect bones to each other.  This condition is often caused by accidentally rolling or twisting the ankle.  Symptoms include pain, swelling, bruising, and trouble walking.  To relieve pain and swelling, put ice on the affected ankle, raise your ankle above the level of your heart, and use an elastic bandage.  Keep all follow-up visits as told by your health care provider. This is important. This information is not intended to replace advice given to you by your health care provider. Make sure you discuss any questions you have with your health care provider. Document Released: 10/16/2005 Document Revised: 07/09/2018 Document Reviewed: 03/12/2018 Elsevier Interactive Patient Education  2019 [employer].

## 2018-11-08 NOTE — Progress Notes (Signed)
Patient ID: Virginia [name], female    DOB: 1981-03-29, 38 y.o.   MRN: [id number]  PCP: [name], MD  Chief Complaint  Patient presents with  . Ankle Pain    Patient in today with c/o left ankle pain and swelling. Patient had fall last friday.    Subjective:   Virginia [name] is a 38 y.o. female, presents to clinic with CC of left ankle pain and swelling after fall that occurred one week ago. Patient is a 38 year old female she presents with left medial ankle pain and swelling onset 1 week ago.  She does have a history of MS has chronic lower extremity paresthesias tingling numbness.  She had been sitting on the couch for at least 1 to 3 hours when she attempted to stand up and place weight on her left foot which was very numb more than her normal baseline numbness.  She fell forward hitting her left knee onto the wood floor.  She was home alone with this fall occurred.  She caught herself a little bit with her hands was unable to crawl up initially so she flipped over and sat on her bottom on the ground for a few minutes before she was able to get herself up.  She states that initially at the time of injury she only had left knee pain and she has subsequently had some bruising to the anterior knee which has gradually improved.  Also since the fall she has had left medial ankle pain and swelling that occurs with palpation, movement or attempting to bear weight.  She has been able to bear weight ever since the time of the accident but she places most of her weight on her left outside heel and foot in any way directly onto her heel causes pain.  She has been treating with elevating icing and resting as much as she can.  She does not have any pain when completely rested but she has severe 8 out of 10 sharp and sore pain with any palpation.  Even light touch to her medial ankle or wearing socks is causing worsening pain.  She is on gabapentin for paresthesias her swelling has gradually improved it  is better today than it was yesterday.  She has had no bruising to her left ankle or foot.  She currently is back to her baseline numbness and tingling in her lower extremities bilaterally and at her baseline her left is usually slightly worse than her right.  She has had no swelling or redness to her foot.  No other complaints, she is seen by Dr. Epimenio Foot with [facility] neurolog[facility] for managing her MS  Ankle Pain   Incident onset: 7 days ago. The injury mechanism was a fall. The pain is present in the left knee, left ankle and left heel. The quality of the pain is described as stabbing and shooting. The pain is at a severity of 8/10. The pain is moderate. The pain has been intermittent since onset. Associated symptoms include a loss of motion, numbness and tingling. Pertinent negatives include no inability to bear weight, loss of sensation or muscle weakness. Associated symptoms comments: Limping, putting weight on outer left foot and heel. She reports no foreign bodies present. The symptoms are aggravated by movement, palpation and weight bearing. She has tried elevation, ice and non-weight bearing for the symptoms. The treatment provided mild relief.      Patient Active Problem List   Diagnosis Date Noted  . Snoring 06/20/2018  .  Excessive daytime sleepiness 06/20/2018  . Neck pain 01/01/2018  . Palpitations 01/01/2018  . Myalgia 09/03/2017  . Low back pain 09/03/2017  . Right leg pain 07/26/2017  . Bilateral sciatica 07/12/2017  . Nystagmus 02/15/2017  . Spell of altered consciousness 07/19/2016  . Ataxic gait 05/31/2016  . Other fatigue 05/31/2016  . Dysesthesia 05/31/2016  . High risk medication use 03/31/2016  . Insomnia 03/31/2016  . Dizziness   . Syncope   . Multiple sclerosis (HCC) 03/21/2016  . MS (multiple sclerosis) (HCC)   . Vertigo   . White matter abnormality on MRI of brain 03/20/2016  . Faintness      Prior to Admission medications   Medication Sig Start Date End Date  Taking? Authorizing Provider  amitriptyline (ELAVIL) 50 MG tablet TAKE 1 TABLET (50 MG TOTAL) BY MOUTH AT BEDTIME AS NEEDED FOR SLEEP. 09/30/18  Yes [name], MD  atenolol (TENORMIN) 50 MG tablet TAKE 1 TABLET BY MOUTH EVERY DAY 07/22/18  Yes [name], MD  baclofen (LIORESAL) 10 MG tablet TAKE 1 TABLET (10 MG TOTAL) 3 (THREE) TIMES DAILY BY MOUTH. 09/09/18  Yes Sater, Pearletha Furl, MD  clonazePAM (KLONOPIN) 1 MG tablet Take 1 tablet (1 mg total) by mouth 2 (two) times daily as needed for anxiety. 10/08/18  Yes [name], MD  diclofenac (VOLTAREN) 75 MG EC tablet TAKE 1 TABLET BY MOUTH TWICE A DAY 09/14/18  Yes [name], MD  DULoxetine (CYMBALTA) 60 MG capsule TAKE 1 CAPSULE BY MOUTH EVERY DAY 08/06/18  Yes [name], MD  gabapentin (NEURONTIN) 600 MG tablet TAKE 1 TABLET (600 MG TOTAL) BY MOUTH 4 (FOUR) TIMES DAILY. 07/22/18  Yes Sater, Pearletha Furl, MD  LORazepam (ATIVAN) 1 MG tablet Take 1 tablet (1 mg total) by mouth every 8 (eight) hours. 06/20/18  Yes Sater, Pearletha Furl, MD  natalizumab (TYSABRI) 300 MG/15ML injection Inject 300 mg into the vein every 28 (twenty-eight) days.   Yes [provider]  pantoprazole (PROTONIX) 40 MG tablet Take 1 tablet (40 mg total) by mouth 2 (two) times daily. 10/08/18  Yes [name], MD     Allergies  Allergen Reactions  . Codeine Other (See Comments)    Hallucinations      Family History  Problem Relation Age of Onset  . Emphysema Mother   . Osteoarthritis Mother   . Other Father   . ALS Maternal Grandmother      Social History   Socioeconomic History  . Marital status: Married    Spouse name: Not on file  . Number of children: Not on file  . Years of education: Not on file  . Highest education level: Not on file  Occupational History  . Not on file  Social Needs  . Financial resource strain: Not on file  . Food insecurity:    Worry: Not on file    Inability: Not on file  .  Transportation needs:    Medical: Not on file    Non-medical: Not on file  Tobacco Use  . Smoking status: Current Every Day Smoker    Types: E-cigarettes  . Smokeless tobacco: Never Used  Substance and Sexual Activity  . Alcohol use: Yes    Alcohol/week: 0.0 standard drinks    Comment: rarely  . Drug use: No  . Sexual activity: Not on file  Lifestyle  . Physical activity:    Days per week: Not on file    Minutes per  session: Not on file  . Stress: Not on file  Relationships  . Social connections:    Talks on phone: Not on file    Gets together: Not on file    Attends religious service: Not on file    Active member of club or organization: Not on file    Attends meetings of clubs or organizations: Not on file    Relationship status: Not on file  . Intimate partner violence:    Fear of current or ex partner: Not on file    Emotionally abused: Not on file    Physically abused: Not on file    Forced sexual activity: Not on file  Other Topics Concern  . Not on file  Social History Narrative  . Not on file     Review of Systems  Constitutional: Negative.   HENT: Negative.   Eyes: Negative.   Respiratory: Negative.   Cardiovascular: Negative.   Gastrointestinal: Negative.   Endocrine: Negative.   Genitourinary: Negative.   Musculoskeletal: Negative.   Skin: Negative.   Allergic/Immunologic: Negative.   Neurological: Positive for tingling and numbness.  Hematological: Negative.   Psychiatric/Behavioral: Negative.   All other systems reviewed and are negative.      Objective:    Vitals:   11/08/18 1134  BP: 124/86  Pulse: (!) 106  Resp: 15  Temp: 98 F (36.7 C)  TempSrc: Oral  SpO2: 97%  Weight: 214 lb 4 oz (97.2 kg)  Height: 5\' 4"  (1.626 m)      Physical Exam Vitals signs and nursing note reviewed.  Constitutional:      Appearance: She is well-developed.  HENT:     Head: Normocephalic and atraumatic.     Nose: Nose normal.  Eyes:     General:         Right eye: No discharge.        Left eye: No discharge.     Conjunctiva/sclera: Conjunctivae normal.  Neck:     Trachea: No tracheal deviation.  Cardiovascular:     Rate and Rhythm: Normal rate and regular rhythm.  Pulmonary:     Effort: Pulmonary effort is normal. No respiratory distress.     Breath sounds: No stridor.  Musculoskeletal:     Left knee: She exhibits normal range of motion, no swelling, no effusion and no deformity.     Left ankle: She exhibits decreased range of motion and swelling. She exhibits no ecchymosis, no deformity, no laceration and normal pulse. Tenderness. Medial malleolus tenderness found. No lateral malleolus and no proximal fibula tenderness found. Achilles tendon exhibits pain. Achilles tendon exhibits no defect and normal Thompson's test results.     Left lower leg: She exhibits no tenderness, no bony tenderness and no swelling. No edema.     Left foot: Normal range of motion and normal capillary refill. Tenderness and bony tenderness present. No swelling, crepitus, deformity or laceration.       Feet:     Comments: Left foot ttp to proximal 1st metatarsal  Skin:    General: Skin is warm and dry.     Findings: No rash.  Neurological:     Mental Status: She is alert.     Motor: No abnormal muscle tone.     Coordination: Coordination normal.     Gait: Gait abnormal (antalgic gait).  Psychiatric:        Behavior: Behavior normal.           Assessment & Plan:  ICD-10-CM   1. Pain and swelling of ankle, left M25.572 DG Ankle Complete Left   M25.472 DG Foot Complete Left  2. Left foot pain M79.672 DG Ankle Complete Left    DG Foot Complete Left  3. Fall, initial encounter W19.XXXA      Pt with MS and hx of lower extremity numbness and tingling presents left medial ankle pain and swelling 1 week after fall.  She had some numbness to her left foot after sitting for several hours when getting up off the couch she fell forward striking her  left knee where she has some evidence of healing bruise.  The time of fall she only had left anterior knee pain and since the fall knee pain has improved and she currently has none but she has had left medial ankle pain and swelling and difficulty ambulating. She does have bony tenderness to medial malleolus and surrounding area and proximal first left metatarsal will obtain x-ray images, given the patient a prescription for crutches.  Tx and follow up based on xray results, so since its Friday lunchtime - pt encouraged to go directly to GI.    Danelle BerryLeisa Saina Waage, PA-C 11/08/18 11:38 AM

## 2018-11-11 ENCOUNTER — Encounter: Payer: Self-pay | Admitting: Family Medicine

## 2018-11-11 NOTE — Telephone Encounter (Signed)
Patient requesting Xray results of left ankle and left foot that Leisa Ordered on Friday. She wanted to know if you could review the xray and give her the results. Please advise?

## 2018-11-12 ENCOUNTER — Other Ambulatory Visit: Payer: Self-pay | Admitting: Family Medicine

## 2018-11-12 DIAGNOSIS — F411 Generalized anxiety disorder: Secondary | ICD-10-CM

## 2018-11-12 NOTE — Telephone Encounter (Signed)
Ok to refill??  Last office visit/ refill 10/08/2018.

## 2018-11-20 ENCOUNTER — Other Ambulatory Visit: Payer: Self-pay | Admitting: Family Medicine

## 2018-11-21 ENCOUNTER — Telehealth: Payer: Self-pay | Admitting: Neurology

## 2018-11-21 NOTE — Telephone Encounter (Signed)
pt has called for the intrafusion suite, call transferred °

## 2018-12-02 ENCOUNTER — Telehealth: Payer: Self-pay | Admitting: Neurology

## 2018-12-02 NOTE — Telephone Encounter (Signed)
pt has called for the intrafusion suite, call transferred °

## 2018-12-03 ENCOUNTER — Telehealth: Payer: Self-pay | Admitting: Neurology

## 2018-12-03 NOTE — Telephone Encounter (Signed)
pt has called for the intrafusion suite, call transferred °

## 2018-12-09 ENCOUNTER — Telehealth: Payer: Self-pay | Admitting: *Deleted

## 2018-12-09 NOTE — Telephone Encounter (Signed)
Faxed completed/signed Tysabri pt status report and reauth questionnaire to [facility] at [phone]. Received confirmation.   Received fax back from [facility] prescribing program that patient re-authorized from 12/09/18-07/12/19. Pt enrollment #: [id number]. Account: [facility]. Site auth #: [id number].

## 2018-12-18 ENCOUNTER — Other Ambulatory Visit: Payer: Self-pay | Admitting: Family Medicine

## 2018-12-18 DIAGNOSIS — F411 Generalized anxiety disorder: Secondary | ICD-10-CM

## 2018-12-18 NOTE — Telephone Encounter (Signed)
Requesting refill  Klonopin  LOV:  10/08/18  LRF:  11/12/2018 #20/1 refill

## 2018-12-24 ENCOUNTER — Ambulatory Visit (INDEPENDENT_AMBULATORY_CARE_PROVIDER_SITE_OTHER): Payer: BLUE CROSS/BLUE SHIELD | Admitting: Neurology

## 2018-12-24 ENCOUNTER — Telehealth: Payer: Self-pay | Admitting: *Deleted

## 2018-12-24 ENCOUNTER — Encounter: Payer: Self-pay | Admitting: Neurology

## 2018-12-24 VITALS — BP 118/78 | HR 93 | Ht 64.0 in | Wt 217.0 lb

## 2018-12-24 DIAGNOSIS — H5500 Unspecified nystagmus: Secondary | ICD-10-CM

## 2018-12-24 DIAGNOSIS — G35 Multiple sclerosis: Secondary | ICD-10-CM

## 2018-12-24 DIAGNOSIS — R208 Other disturbances of skin sensation: Secondary | ICD-10-CM

## 2018-12-24 DIAGNOSIS — Z79899 Other long term (current) drug therapy: Secondary | ICD-10-CM

## 2018-12-24 DIAGNOSIS — R5383 Other fatigue: Secondary | ICD-10-CM

## 2018-12-24 MED ORDER — ARMODAFINIL 200 MG PO TABS: ORAL_TABLET | ORAL | 5 refills | Status: DC

## 2018-12-24 NOTE — Telephone Encounter (Signed)
Placed [facility] lab in [facility] lock box for routine lab pick up.  

## 2018-12-24 NOTE — Progress Notes (Signed)
[facility]  PATIENT: Virginia [name] DOB: 09/19/1981  REFERRING DOCTOR OR PCP:    Blair Heys SOURCE: patient, admit/discharge hospital notes, imaging reports, MRI's on PACS  _________________________________   HISTORICAL  CHIEF COMPLAINT:  Chief Complaint  Patient presents with  . Follow-up    RM 12 with wife. Last seen 06/20/18. No new sx.   . Multiple Sclerosis    On Tysabri.  Last infusion:10/24/2018. She is overdue for infusion. I checked with Almira Coaster and pt approved today with new insurance. She has a balance and is going to talk with billing on Thursday afternoon about this and then get scheduled. JCV ab last checked 06/20/18-indeterminate at 0.23.  Inhibition assay was Negative. NEEDS JCV LAB TODAY    HISTORY OF PRESENT ILLNESS:  Virginia [name] is a 38 y.o. woman with multiple sclerosis who also has back pain.  Update 12/24/2018: She is on Tysabri as her disease modifying therapy for MS.  She tolerates it well.  Her last infusion was 2 months ago.  She is overdue but has had trouble due to insurance authorization issues.    She is JCV antibody negative (indeterminate at 0.23 with a negative inhibition assay).     She feels her gait is about the same but the feet hurt a lot and the left foot often goes numb.      Her left ankle is swollen since a fall (due to hr foot being numb).    She had x-rays done and was told everything looked good.   She notes mild weakness in her toes.    Arms are strong.     Bladder function is fine.   She notes mild visual fluttering at times and her ophthalmologist told her she had mild horizontal nystagmus.    She reports a lot of fatigue.     She is on several medications that may make her more sleepy including clonazepam, baclofen 10 mg po tid, amitriptyline and gabapentin.   She tried stopping gabapentin and baclofen but felt better when she did.   Amitriptyline is just used on nights she has severe insomnia.     A sleep study late  last year showed an AHI = 5.7 (very mild OSA).       Update 06/20/2018: She is on Tysabri.  She tolerates the infusions well.  Her last JCV body test was negative at 0.2 (indeterminant followed by negative inhibition assay).     She denies any new MS symptoms.   Her gait is doing a bout the same.    She is noting more numbness in her left leg.   She denies weakness.   She has urinary urgency but has no incontinence.    Vision is doing well.     She has a pulled muscle sensation in her left leg.    She sees Pain management (Dr. Wynn Banker).  An ESI did not help earlier this year.    Nothing increases or decreases her pain.      She has insomnia, both sleep onset and sleep maintenance.    Temazepam sometimes helps the sleep onset but not the sleep maintenance.      She notes spells where she feels her heart racing and she feels her eye get blurry.  These occur any time..   She did a Holter monitor and she seemed to have a VTach run.  She sees Dr. Ladona Ridgel.    She was placed on atenolol.   She does not think  it has helped as she still has palpitations.     EEG 04/16/2018 was essentially normal.  There is no epileptiform activity.  There was some excess beta activity that could be due to some of the medicines that she takes.  EPWORTH SLEEPINESS SCALE  On a scale of 0 - 3 what is the chance of dozing:  Sitting and Reading:   2 Watching TV:    2 Sitting inactive in a public place: 0 Passenger in car for one hour: 1 Lying down to rest in the afternoon: 3 Sitting and talking to someone: 1 Sitting quietly after lunch:  1 In a car, stopped in traffic:  0  Total (out of 24):    10/24 (mild)   Update 01/01/2018:  She feels that her MS has been stable. She is on Tysabri and has tolerated it well. She has not had any definite exacerbations.    She has noted fluctuating numbness in her toes and hands.   .   She has had some episodes where she feels she is having palpitations lasting minutes.    She does not  know if there is any lightheadedness.     Her pulse is rapid.    This is occurring multiple times a day.    Sometimes she shakes and sometimes vision seems blurry.   This can occur in any position.     She had an ESI 4 weeks ago and it only helped slightly x 2-3 days.   She had a disc protrusion at L5-S1 that could contact the S1 nerve roots.    Pain management clinic has been contacted but she has not heard back yet.    She takes gabapentin 600 mg po qid but it has not helped her much.   She feels a little out of it at time.s    She also takes baclofen tid.      She still has a headache every day in both temples.   Moving does not change the HA.   No nausea or photophobia.      She sleeps about the same --- she has DPSD and can't sleep before 2 -3 am.   Temazepam has helped her fall asleep when she lays down. If she takes temazepam, she can't fall asleep before 2 AM.   She sometimes has light snoring.    She has episodes of waking up with a feeling of rapid heart rate but not with snorting  She has had a tremor that is a little worse in her hands. She notes it most when she tries to write or eat or hold something  Update 09/03/2017:    She feels her MS is stable. She is tolerating Tysabri well and has not had any exacerbations.  She continues to report low back pain and bilateral hip pain. She has cramping spasms in her calves. She also reports neck pain and shoulder pain.   She has not had much benefit from gabapentin, meloxicam and Naprosyn. Hydrocodone and steroids helped just a little bit.    She takes baclofen only at night and it does not make her that sleepy.      She has headaches about 3 days a week.   Pain is bilateral in her forehead.   She has not noted any triggers and they seem to occur randomly.    Aleve helps mildly.   She denies N/V.    She has photophobia and phonophobia.    She used to only  get occasional headaches.      She has neck pain that seems distinct from the headache.     She is not sleeping well many nights and has DPSD sleeping from 3 am to 11 am.   He usually wakes up for a few minutes around 7 AM and then falls back asleep  From 07/12/2017: LBP/Leg pain/nek:  She has pian all over right now but pan started in her lower back and radiates into her legs.   Pain is independent of position and OTC med's did not help.   Baclofen has not helped.    She has milder pain in her neck.   Pain is disturbing her sleep.     MRI 12/25/2016 showed that she has mild spinal stenosis at C4-C5, C5-C6 and C6-C7 due to disc protrusions and mild uncovertebral spurring that MRI also showed two MS lesions in the spinal cord, one at C6-C7 and one at T1.   Thoracic spine MRI in 2017 did not show any significant degenerative changes. Lumbar x-rays 2016 not show any significant finding.     She also noes some arm and leg weakness.  She has no no numbness bu thas hd tingling (long term form her MS).     MS:  She has been on Tysabri since March 2018 and she tolerates it well.   She did have an exacerbation (leg numbness and nystagmus) after her first dose treated with 3 days of IV Solu-Medrol.  Before Tysabri, she was on Aubagio but had breakthrough disease -- MRI 11/28/16 showed that she had about 7 new gadolinium enhancing lesions.  Most were small in the or deep white matter but one juxtacortical focus on the left was moderate in size.   MRI of the cervical spine February 2017 showed 2 plaques in the spinal cord, one not present in May 2017.                                                                                                      Gait/strength/sensation: Her balance is better so gait is doing better as well since starting Tysabri.   Strength is more symmetric now.     There is no facial weakness or numbness.   Bladder/bowel: She has noted mild urinary frequency and urgency but no incontinence. There is no constipation.  Vision: She denies blurry vision or diplopia now. She was told she  had ocular flutters at last eye exam.     Vertigo:   She has occasional spells of vertigo similar to last visit.  Fatigue/sleep: She has both physical and cognitive fatigue.  She feels her insomnia is doing better on temazepam.   She was doing well with temazepam for her insomnia but sleep is worse recently due to her pain.  Mood/cognition: She denies depression and anxiety.      She denies any significant change in cognitive function. She notes some difficulty with memory, focus and verbal fluency.   MS History:   In mid May 2017, she was starting to experience occasional lightheadedness and a spinning vertigo. On  03/19/2016, she had more extreme vertigo and had an episode of syncope. She notices that when she was walking she would veer towards the right. Because of the syncope, she was taken to the emergency room. He had an MRI of the brain that showed several enhancing lesions, potentially worrisome for multiple sclerosis. Additional studies were performed. She received 3 days of IV steroids. The MRI's of the spine did not show any additional plaques. The lumbar puncture showed oligoclonal bands with normal IgG index, consistent with multiple sclerosis.  MRI images from 03/20/2016, 03/22/2016 and CT angiogram images from 03/23/2016 were personally reviewed. The MRI of the brain shows several 2/fair hyperintense foci, some in the periventricular and juxtacortical white matter. Most of the foci enhanced after contrast administration. MRI of the cervical and thoracic spine did not show any spinal cord plaques and there was no significant degenerative change.   CT angiogram was essentially normal showing no significant stenosis.   She underwent a lumbar puncture on 03/21/2016. 4 oligoclonal bands present in the CSF which were not present in the serum, consistent with multiple sclerosis. The IgG index was high normal at 0.6.   There were only 4 white blood cells but 280 red blood cells more consistent with  a slightly traumatic tap.    The [facility] Spotted Fever CSF IgG was negative but the IgM was positive at 1.41 (less than 0.9 normal).    ANA and ANCA were both negative.    REVIEW OF SYSTEMS: Constitutional: No fevers, chills, sweats, or change in appetite.   She has fatigue. Insomnia much better with temazepam Eyes: No visual changes, double vision, eye pain Ear, nose and throat: No hearing loss, ear pain, nasal congestion, sore throat Cardiovascular: No chest pain, palpitations Respiratory: No shortness of breath at rest or with exertion.   No wheezes GastrointestinaI: No nausea, vomiting, diarrhea, abdominal pain, fecal incontinence Genitourinary: No dysuria, urinary retention.   She reports frequency.  No nocturia. Musculoskeletal: No neck pain, back pain Integumentary: No rash, pruritus, skin lesions Neurological: as above Psychiatric: No depression at this time.  No anxiety Endocrine: No palpitations, diaphoresis, change in appetite, change in weigh or increased thirst Hematologic/Lymphatic: No anemia, purpura, petechiae. Allergic/Immunologic: No itchy/runny eyes, nasal congestion, recent allergic reactions, rashes  ALLERGIES: Allergies  Allergen Reactions  . Codeine Other (See Comments)    Hallucinations     HOME MEDICATIONS:  Current Outpatient Medications:  .  amitriptyline (ELAVIL) 50 MG tablet, TAKE 1 TABLET (50 MG TOTAL) BY MOUTH AT BEDTIME AS NEEDED FOR SLEEP., Disp: 90 tablet, Rfl: 2 .  atenolol (TENORMIN) 50 MG tablet, TAKE 1 TABLET BY MOUTH EVERY DAY, Disp: 90 tablet, Rfl: 3 .  baclofen (LIORESAL) 10 MG tablet, TAKE 1 TABLET (10 MG TOTAL) 3 (THREE) TIMES DAILY BY MOUTH., Disp: 90 tablet, Rfl: 11 .  clonazePAM (KLONOPIN) 1 MG tablet, TAKE 1 TABLET (1 MG TOTAL) BY MOUTH 2 (TWO) TIMES DAILY AS NEEDED FOR ANXIETY., Disp: 20 tablet, Rfl: 1 .  diclofenac (VOLTAREN) 75 MG EC tablet, TAKE 1 TABLET BY MOUTH TWICE A DAY, Disp: 60 tablet, Rfl: 1 .  DULoxetine  (CYMBALTA) 60 MG capsule, TAKE 1 CAPSULE BY MOUTH EVERY DAY, Disp: 90 capsule, Rfl: 2 .  gabapentin (NEURONTIN) 600 MG tablet, TAKE 1 TABLET (600 MG TOTAL) BY MOUTH 4 (FOUR) TIMES DAILY., Disp: 120 tablet, Rfl: 5 .  LORazepam (ATIVAN) 1 MG tablet, Take 1 tablet (1 mg total) by mouth every 8 (eight) hours., Disp: 30  tablet, Rfl: 5 .  natalizumab (TYSABRI) 300 MG/15ML injection, Inject 300 mg into the vein every 28 (twenty-eight) days., Disp: , Rfl:  .  pantoprazole (PROTONIX) 40 MG tablet, Take 1 tablet (40 mg total) by mouth 2 (two) times daily., Disp: 60 tablet, Rfl: 3 .  Armodafinil 200 MG TABS, Take 1/2 to 1 pill every morning, Disp: 30 tablet, Rfl: 5  PAST MEDICAL HISTORY: Past Medical History:  Diagnosis Date  . Anxiety   . Headache   . Insomnia   . Multiple sclerosis (HCC)   . Nystagmus 02/15/2017  . Syncope and collapse     PAST SURGICAL HISTORY: History reviewed. No pertinent surgical history.  FAMILY HISTORY: Family History  Problem Relation Age of Onset  . Emphysema Mother   . Osteoarthritis Mother   . Other Father   . ALS Maternal Grandmother     SOCIAL HISTORY:  Social History   Socioeconomic History  . Marital status: Married    Spouse name: Not on file  . Number of children: Not on file  . Years of education: Not on file  . Highest education level: Not on file  Occupational History  . Not on file  Social Needs  . Financial resource strain: Not on file  . Food insecurity:    Worry: Not on file    Inability: Not on file  . Transportation needs:    Medical: Not on file    Non-medical: Not on file  Tobacco Use  . Smoking status: Current Every Day Smoker    Types: E-cigarettes  . Smokeless tobacco: Never Used  Substance and Sexual Activity  . Alcohol use: Yes    Alcohol/week: 0.0 standard drinks    Comment: rarely  . Drug use: No  . Sexual activity: Not on file  Lifestyle  . Physical activity:    Days per week: Not on file    Minutes per session:  Not on file  . Stress: Not on file  Relationships  . Social connections:    Talks on phone: Not on file    Gets together: Not on file    Attends religious service: Not on file    Active member of club or organization: Not on file    Attends meetings of clubs or organizations: Not on file    Relationship status: Not on file  . Intimate partner violence:    Fear of current or ex partner: Not on file    Emotionally abused: Not on file    Physically abused: Not on file    Forced sexual activity: Not on file  Other Topics Concern  . Not on file  Social History Narrative  . Not on file     PHYSICAL EXAM  Vitals:   12/24/18 1454  BP: 118/78  Pulse: 93  SpO2: 98%  Weight: 217 lb (98.4 kg)  Height:  (1.626 m)    Body mass index is 37.25 kg/m.   General: The patient is well-developed and well-nourished and in no acute distress,.  Mild pedal edema on the left.  Neurologic Exam  Mental status: The patient is alert and oriented x 3 at the time of the examination. The patient has apparent normal recent and remote memory, with an apparently normal attention span and concentration ability.   Speech is normal.  Cranial nerves: Extraocular muscles are full though she has some end gaze nystagmus when she looks to the left..  Facial strength and sensation is normal. Trapezius strength is normal.  No obvious hearing deficits are noted.  Motor:  Muscle bulk is normal.  Strength is 5/5. Tone is mildly increased in the legs.  Sensory: She has reduced sensation to touch and vibration in the left leg relative to the right leg.  Sensation was normal in the arms.  Coordination: Cerebellar testing reveals good finger-nose-finger and heel-to-shin bilaterally.  Gait and station: Station is normal.   The gait is normal.  Tandem gait is mildly wide.  Romberg is negative. Reflexes: Deep tendon reflexes are symmetric and normal in arms.  DTRs are brisk at knees.    There is no ankle  clonus.    DIAGNOSTIC DATA (LABS, IMAGING, TESTING) - I reviewed patient records, labs, notes, testing and imaging myself where available.  Lab Results  Component Value Date   WBC 7.9 06/20/2018   HGB 12.7 06/20/2018   HCT 37.9 06/20/2018   MCV 94 06/20/2018   PLT 335 06/20/2018      Component Value Date/Time   NA 137 03/19/2016 1929   K 3.7 03/19/2016 1929   CL 110 03/19/2016 1929   CO2 18 (L) 03/19/2016 1929   GLUCOSE 100 (H) 03/19/2016 1929   BUN 16 03/19/2016 1929   CREATININE 1.04 (H) 03/19/2018 1417   CALCIUM 8.9 03/19/2016 1929   PROT 6.1 12/04/2016 1131   ALBUMIN 3.7 12/04/2016 1131   AST 11 12/04/2016 1131   ALT 13 12/04/2016 1131   ALKPHOS 73 12/04/2016 1131   BILITOT <0.2 12/04/2016 1131   GFRNONAA >60 03/19/2018 1417   GFRAA >60 03/19/2018 1417    Lab Results  Component Value Date   TSH 1.010 01/01/2018       ASSESSMENT AND PLAN  Multiple sclerosis (HCC) - Plan: Stratify JCV Antibody Test ([facility]), CBC with Differential/Platelet  High risk medication use  Other fatigue  Dysesthesia  Nystagmus   1.    Continue Tysabri 300 mg every 4 weeks.  Check JCV antibody and CBC with differential. 2.    Her OSA was low with an AHI= 5.7 so CPAP not indicated.     I'll add Nuvigil as it may help her fatigue and sleepiness 3.   Try a lower dose of gabapentin (1800 a day instead 2400 mg a day)  4.    Should take just clonazepam or lorazepam, not both.   5.   Return 5-6 months.  Call sooner if there are new or worsening neurologic symptoms.  Astrid Vides A. Epimenio Foot, MD, PhD 12/24/2018, 3:24 PM Certified in Neurology, Clinical Neurophysiology, Sleep Medicine, Pain Medicine and Neuroimaging  [facility] [address], [address] [place], [place] [address] [phone]

## 2018-12-25 LAB — CBC WITH DIFFERENTIAL/PLATELET
Basophils Absolute: 0.1 10*3/uL (ref 0.0–0.2)
Basos: 1 %
EOS (ABSOLUTE): 0.5 10*3/uL — ABNORMAL HIGH (ref 0.0–0.4)
Eos: 6 %
Hematocrit: 40.1 % (ref 34.0–46.6)
Hemoglobin: 13.4 g/dL (ref 11.1–15.9)
IMMATURE GRANULOCYTES: 0 %
Immature Grans (Abs): 0 10*3/uL (ref 0.0–0.1)
Lymphocytes Absolute: 2.8 10*3/uL (ref 0.7–3.1)
Lymphs: 37 %
MCH: 31.1 pg (ref 26.6–33.0)
MCHC: 33.4 g/dL (ref 31.5–35.7)
MCV: 93 fL (ref 79–97)
Monocytes Absolute: 0.7 10*3/uL (ref 0.1–0.9)
Monocytes: 9 %
Neutrophils Absolute: 3.6 10*3/uL (ref 1.4–7.0)
Neutrophils: 47 %
PLATELETS: 347 10*3/uL (ref 150–450)
RBC: 4.31 x10E6/uL (ref 3.77–5.28)
RDW: 12.9 % (ref 11.7–15.4)
WBC: 7.6 10*3/uL (ref 3.4–10.8)

## 2018-12-26 ENCOUNTER — Encounter: Payer: Self-pay | Admitting: Family Medicine

## 2018-12-30 ENCOUNTER — Telehealth: Payer: Self-pay | Admitting: *Deleted

## 2018-12-30 ENCOUNTER — Other Ambulatory Visit: Payer: Self-pay | Admitting: Family Medicine

## 2018-12-30 DIAGNOSIS — F410 Panic disorder [episodic paroxysmal anxiety] without agoraphobia: Secondary | ICD-10-CM

## 2018-12-30 NOTE — Telephone Encounter (Signed)
JCV drawn on 12/24/18 negative, titer: 0.19.

## 2018-12-30 NOTE — Telephone Encounter (Signed)
Submitted PA armodafinil on covermymeds. [id number]. [id number]. Approved 11/30/2018-12/30/2019.   Faxed notice of approval to [facility] at [phone]. Received fax confirmation.

## 2019-01-01 ENCOUNTER — Telehealth: Payer: Self-pay | Admitting: *Deleted

## 2019-01-01 ENCOUNTER — Other Ambulatory Visit: Payer: Self-pay | Admitting: *Deleted

## 2019-01-01 DIAGNOSIS — G35 Multiple sclerosis: Secondary | ICD-10-CM

## 2019-01-01 DIAGNOSIS — Z79899 Other long term (current) drug therapy: Secondary | ICD-10-CM

## 2019-01-01 NOTE — Telephone Encounter (Signed)
Received notification from [employer] that Tysabri approved 12/11/18-10/30/19. Ref#: [id number]. Gave information to infusion suite for their records.

## 2019-01-02 ENCOUNTER — Telehealth: Payer: Self-pay | Admitting: Neurology

## 2019-01-02 NOTE — Telephone Encounter (Signed)
[employer] Auth: [id number] (exp. 01/02/19 to 01/31/19) order sent to [facility]. They will reach out to the pt to schedule.

## 2019-01-03 ENCOUNTER — Other Ambulatory Visit: Payer: Self-pay | Admitting: Neurology

## 2019-01-08 ENCOUNTER — Encounter: Payer: Self-pay | Admitting: Family Medicine

## 2019-01-16 DIAGNOSIS — G35 Multiple sclerosis: Secondary | ICD-10-CM | POA: Diagnosis not present

## 2019-01-17 ENCOUNTER — Other Ambulatory Visit: Payer: Self-pay

## 2019-01-17 ENCOUNTER — Ambulatory Visit
Admission: RE | Admit: 2019-01-17 | Discharge: 2019-01-17 | Disposition: A | Discharge status: Home / Self Care | Payer: BLUE CROSS/BLUE SHIELD | Source: Ambulatory Visit | Attending: Neurology | Admitting: Neurology

## 2019-01-17 DIAGNOSIS — Z79899 Other long term (current) drug therapy: Secondary | ICD-10-CM

## 2019-01-17 DIAGNOSIS — G35 Multiple sclerosis: Secondary | ICD-10-CM

## 2019-01-17 MED ORDER — GADOBENATE DIMEGLUMINE 529 MG/ML IV SOLN
20.0000 mL | Freq: Once | INTRAVENOUS | Status: AC | PRN
  Administered 2019-01-17: 20 mL via INTRAVENOUS

## 2019-01-20 ENCOUNTER — Telehealth: Payer: Self-pay | Admitting: *Deleted

## 2019-01-20 ENCOUNTER — Encounter: Payer: Self-pay | Admitting: Family Medicine

## 2019-01-20 NOTE — Telephone Encounter (Signed)
-----   Message from Asa Lente, MD sent at 01/18/2019 11:04 AM EDT ----- Please let her know that the MRI did not show any new lesions.

## 2019-01-21 ENCOUNTER — Other Ambulatory Visit: Payer: Self-pay | Admitting: Neurology

## 2019-01-23 ENCOUNTER — Other Ambulatory Visit: Payer: Self-pay | Admitting: Family Medicine

## 2019-01-23 DIAGNOSIS — F411 Generalized anxiety disorder: Secondary | ICD-10-CM

## 2019-01-23 NOTE — Telephone Encounter (Signed)
Ok to refill??  Last office visit 10/08/2018.  Last refill 12/19/2018.

## 2019-02-10 ENCOUNTER — Telehealth: Payer: Self-pay | Admitting: *Deleted

## 2019-02-10 NOTE — Telephone Encounter (Signed)
Chase from [facility] called to f.u on fax they sent about pt being on both clonazepam and lorazepam. Advised per Dr. Epimenio Foot they can d/c lorazepam since she has  Been getting clonazepam. He verbalized understanding.

## 2019-02-11 ENCOUNTER — Telehealth: Payer: Self-pay | Admitting: Family Medicine

## 2019-02-11 NOTE — Telephone Encounter (Signed)
Per Dr. Hubbard Hartshorn pt can have Klonopin 1 mg bid #60 if requested.

## 2019-02-11 NOTE — Telephone Encounter (Signed)
Chase from [facility] called and stated that he wanted to let us know that pt was receiving Klonopin from [place] and Ativan from Dr. Epimenio Foot. Per Dr. Alfredia Client note pt was to stop the Ativan and use on Klonopin bid. Also pt was to see psych. Per telephone note with Dr. Epimenio Foot the Lona Millard was dc'd

## 2019-02-12 DIAGNOSIS — G35 Multiple sclerosis: Secondary | ICD-10-CM | POA: Diagnosis not present

## 2019-02-24 ENCOUNTER — Other Ambulatory Visit: Payer: Self-pay | Admitting: Family Medicine

## 2019-02-24 DIAGNOSIS — F411 Generalized anxiety disorder: Secondary | ICD-10-CM

## 2019-02-24 NOTE — Telephone Encounter (Signed)
Ok to refill??  Last office visit 11/08/2018.  Last refill 01/24/2019, #1 refill.

## 2019-03-13 DIAGNOSIS — G35 Multiple sclerosis: Secondary | ICD-10-CM | POA: Diagnosis not present

## 2019-03-15 ENCOUNTER — Encounter: Payer: Self-pay | Admitting: Family Medicine

## 2019-03-17 ENCOUNTER — Other Ambulatory Visit: Payer: Self-pay | Admitting: Family Medicine

## 2019-03-19 ENCOUNTER — Encounter: Payer: Self-pay | Admitting: Family Medicine

## 2019-03-20 ENCOUNTER — Encounter: Payer: Self-pay | Admitting: Family Medicine

## 2019-03-20 ENCOUNTER — Other Ambulatory Visit: Payer: Self-pay

## 2019-03-20 ENCOUNTER — Ambulatory Visit (INDEPENDENT_AMBULATORY_CARE_PROVIDER_SITE_OTHER): Payer: BLUE CROSS/BLUE SHIELD | Admitting: Family Medicine

## 2019-03-20 VITALS — BP 98/62 | HR 84 | Temp 98.7°F | Resp 16 | Ht 64.0 in | Wt 225.0 lb

## 2019-03-20 DIAGNOSIS — K219 Gastro-esophageal reflux disease without esophagitis: Secondary | ICD-10-CM | POA: Diagnosis not present

## 2019-03-20 DIAGNOSIS — R000 Tachycardia, unspecified: Secondary | ICD-10-CM

## 2019-03-20 DIAGNOSIS — F410 Panic disorder [episodic paroxysmal anxiety] without agoraphobia: Secondary | ICD-10-CM | POA: Diagnosis not present

## 2019-03-20 MED ORDER — SUCRALFATE 1 G PO TABS: 1.0000 g | ORAL_TABLET | Freq: Three times a day (TID) | ORAL | 1 refills | Status: DC

## 2019-03-20 NOTE — Progress Notes (Signed)
Subjective:    Patient ID: Virginia [name], female    DOB: 02/24/1981, 38 y.o.   MRN: [id number]  HPI  Patient was originally scheduled today to be seen as a telephone visit.  I called the patient at [phone].  She initially agreed to be seen by telephone.  Patient states that her heart feels like it is racing.  I asked the patient to check her pulse.  Patient states that she counted it for 10 seconds and found to be 25 beats.  This would equal a heart rate of 150 bpm.  Therefore I asked the patient to hang up the phone immediately and come to my office.  At the present time she denies any chest pain or lightheadedness or syncope or shortness of breath however she does feel extremely anxious and her heart is racing.  Patient was seen by cardiology (Dr. Ladona Ridgelaylor) last summer when an event monitor revealed runs of nonsustained ventricular tachycardia.  At that time she was started on atenolol although they did discuss possibly using Rythmol or flecainide.  She elected to up titrate atenolol first.   On arrival here, her heart rate is 84 bpm and regular. EKG reveals normal sinus rhythm with normal intervals and a normal axis.  There is no evidence of Wolff-Parkinson-White.  No evidence of Brugada syndrome.  QT interval was normal.  Patient does have inverted T waves in V1 and V2.  Despite having a normal EKG and a normal heart rate on physical exam, the patient continues to feel like her heart is racing even while we are talking.  She feels like it is pounding inside of her.  She also reports feeling tremulous.  She holds up both her hands and they do have a fine shaking tremor.  Her last thyroid test was in March or April of last year and was normal.  She is only taking Klonopin at night.  She is not taking Ativan any longer.  She has not tried Klonopin when she has 1 of these "attacks" of pounding heartbeats.  She also reports severe acid reflux.  She states that happening constantly.  She reports feeling a burning  sensation up in her throat.  She also reports occasional sharp chest pains just to the right of her sternum that come and go within a few seconds.  These seem to be unrelated to activity or racing heart rate.  She has tried Maalox/Mylanta over-the-counter and it has helped slightly with the symptoms but not completely.  She still drinking quite a few carbonated beverages.  She is not drinking any alcohol.  She does eat a lot of peppermint.  She denies eating any spicy food.  She usually sleeps on a slight incline. Past Medical History:  Diagnosis Date   Anxiety    Headache    Insomnia    Multiple sclerosis (HCC)    Nystagmus 02/15/2017   Syncope and collapse    No past surgical history on file. Current Outpatient Medications on File Prior to Visit  Medication Sig Dispense Refill   amitriptyline (ELAVIL) 50 MG tablet TAKE 1 TABLET (50 MG TOTAL) BY MOUTH AT BEDTIME AS NEEDED FOR SLEEP. 90 tablet 2   atenolol (TENORMIN) 50 MG tablet TAKE 1 TABLET BY MOUTH EVERY DAY 90 tablet 3   baclofen (LIORESAL) 10 MG tablet TAKE 1 TABLET (10 MG TOTAL) 3 (THREE) TIMES DAILY BY MOUTH. 90 tablet 11   clonazePAM (KLONOPIN) 1 MG tablet TAKE 1 TABLET (1 MG TOTAL) BY  MOUTH 2 TIMES DAILY AS NEEDED FOR ANXIETY. 20 tablet 1   diclofenac (VOLTAREN) 75 MG EC tablet TAKE 1 TABLET BY MOUTH TWICE A DAY 60 tablet 1   DULoxetine (CYMBALTA) 60 MG capsule TAKE 1 CAPSULE BY MOUTH EVERY DAY 90 capsule 2   gabapentin (NEURONTIN) 600 MG tablet TAKE 1 TABLET (600 MG TOTAL) BY MOUTH 4 (FOUR) TIMES DAILY. 120 tablet 5   LORazepam (ATIVAN) 1 MG tablet TAKE 1 TABLET BY MOUTH EVERY 8 HOURS AS NEEDED 30 tablet 5   natalizumab (TYSABRI) 300 MG/15ML injection Inject 300 mg into the vein every 28 (twenty-eight) days.     pantoprazole (PROTONIX) 40 MG tablet TAKE 1 TABLET BY MOUTH TWICE A DAY 180 tablet 3   No current facility-administered medications on file prior to visit.    Allergies  Allergen Reactions   Codeine  Other (See Comments)    Hallucinations    Social History   Socioeconomic History   Marital status: Married    Spouse name: Not on file   Number of children: Not on file   Years of education: Not on file   Highest education level: Not on file  Occupational History   Not on file  Social Needs   Financial resource strain: Not on file   Food insecurity:    Worry: Not on file    Inability: Not on file   Transportation needs:    Medical: Not on file    Non-medical: Not on file  Tobacco Use   Smoking status: Current Every Day Smoker    Types: E-cigarettes   Smokeless tobacco: Never Used  Substance and Sexual Activity   Alcohol use: Yes    Alcohol/week: 0.0 standard drinks    Comment: rarely   Drug use: No   Sexual activity: Not on file  Lifestyle   Physical activity:    Days per week: Not on file    Minutes per session: Not on file   Stress: Not on file  Relationships   Social connections:    Talks on phone: Not on file    Gets together: Not on file    Attends religious service: Not on file    Active member of club or organization: Not on file    Attends meetings of clubs or organizations: Not on file    Relationship status: Not on file   Intimate partner violence:    Fear of current or ex partner: Not on file    Emotionally abused: Not on file    Physically abused: Not on file    Forced sexual activity: Not on file  Other Topics Concern   Not on file  Social History Narrative   Not on file    Review of Systems  All other systems reviewed and are negative.      Objective:   Physical Exam Vitals signs reviewed.  Constitutional:      General: She is not in acute distress.    Appearance: Normal appearance. She is not ill-appearing, toxic-appearing or diaphoretic.  Cardiovascular:     Rate and Rhythm: Normal rate and regular rhythm.     Pulses: Normal pulses.     Heart sounds: No murmur. No friction rub. No gallop.   Pulmonary:      Effort: Pulmonary effort is normal. No respiratory distress.     Breath sounds: No stridor. No wheezing, rhonchi or rales.  Chest:     Chest wall: No tenderness.  Musculoskeletal:     Right  lower leg: No edema.     Left lower leg: No edema.  Neurological:     Mental Status: She is alert.  Psychiatric:        Mood and Affect: Mood is anxious.           Assessment & Plan:  Panic attacks  Tachycardia - Plan: EKG 12-Lead  Gastroesophageal reflux disease, esophagitis presence not specified  I feel that the patient is dealing with anxiety and having panic attacks manifesting physical symptoms.  I have recommended taking half a Klonopin whenever she feels like her heart rate is racing and trying this through the weekend to see if this helps control the symptoms.  If so, I would focus on better managing her anxiety by discontinuing Cymbalta and replacing it with Trintellix.  EKG today and physical exam are reassuring.  Patient's medication list shows that she is taking Ativan.  This is incorrect.  She has not using Ativan at all.  I will remove this from her medicine list.  She continues to have breakthrough acid reflux making me suspect a hiatal hernia.  I recommended avoiding caffeinated beverages or carbonated beverages.  I recommended avoiding peppermint.  I have recommended sleeping with the head of the bed elevated.  Continue Protonix 40 mg twice a day and add Carafate 1 g p.o. q. before meals at bedtime and reassess in 3 to 4 weeks.  She will email me next week to let me know if the Klonopin is helped the "racing heart rate".

## 2019-03-28 ENCOUNTER — Encounter: Payer: Self-pay | Admitting: Family Medicine

## 2019-04-01 ENCOUNTER — Other Ambulatory Visit: Payer: Self-pay | Admitting: Family Medicine

## 2019-04-01 DIAGNOSIS — F411 Generalized anxiety disorder: Secondary | ICD-10-CM

## 2019-04-02 NOTE — Telephone Encounter (Signed)
Requesting refill    Klonopin   LOV: 03/20/19  LRF:  02/25/19

## 2019-04-10 DIAGNOSIS — G35 Multiple sclerosis: Secondary | ICD-10-CM | POA: Diagnosis not present

## 2019-04-28 ENCOUNTER — Other Ambulatory Visit: Payer: Self-pay | Admitting: Family Medicine

## 2019-04-28 ENCOUNTER — Other Ambulatory Visit: Payer: Self-pay | Admitting: *Deleted

## 2019-04-28 DIAGNOSIS — F411 Generalized anxiety disorder: Secondary | ICD-10-CM

## 2019-04-28 MED ORDER — METHYLPREDNISOLONE 4 MG PO TBPK: ORAL_TABLET | ORAL | 0 refills | Status: DC

## 2019-04-28 MED ORDER — MECLIZINE HCL 25 MG PO TABS: 25.0000 mg | ORAL_TABLET | Freq: Three times a day (TID) | ORAL | 0 refills | Status: DC | PRN

## 2019-04-29 NOTE — Telephone Encounter (Signed)
Requested Prescriptions   Pending Prescriptions Disp Refills  . clonazePAM (KLONOPIN) 1 MG tablet [Pharmacy Med Name: CLONAZEPAM 1 MG TABLET] 20 tablet 1    Sig: TAKE 1 TABLET BY MOUTH TWICE A DAY AS NEEDED FOR ANXIETY     Last OV 03/20/2019  Last written 04/02/2019

## 2019-05-01 ENCOUNTER — Encounter: Payer: Self-pay | Admitting: Family Medicine

## 2019-05-08 DIAGNOSIS — G35 Multiple sclerosis: Secondary | ICD-10-CM | POA: Diagnosis not present

## 2019-05-21 ENCOUNTER — Telehealth: Payer: Self-pay | Admitting: *Deleted

## 2019-05-21 ENCOUNTER — Encounter: Payer: Self-pay | Admitting: Family Medicine

## 2019-05-21 NOTE — Telephone Encounter (Signed)
Gave completed/signed Candis Shine form back to medical records to process for pt.

## 2019-05-22 ENCOUNTER — Telehealth: Payer: Self-pay | Admitting: Neurology

## 2019-05-22 NOTE — Telephone Encounter (Signed)
Left message on pt's VM in regards to form payment.

## 2019-05-25 ENCOUNTER — Other Ambulatory Visit: Payer: Self-pay | Admitting: Family Medicine

## 2019-05-25 DIAGNOSIS — F411 Generalized anxiety disorder: Secondary | ICD-10-CM

## 2019-05-26 NOTE — Telephone Encounter (Signed)
Ok to refill??  Last office visit 03/20/2019.  Last refill 04/29/2019, #1 refill.

## 2019-05-29 ENCOUNTER — Encounter: Payer: Self-pay | Admitting: Family Medicine

## 2019-05-29 ENCOUNTER — Ambulatory Visit (INDEPENDENT_AMBULATORY_CARE_PROVIDER_SITE_OTHER): Payer: BC Managed Care – PPO | Admitting: Family Medicine

## 2019-05-29 ENCOUNTER — Other Ambulatory Visit: Payer: Self-pay

## 2019-05-29 VITALS — BP 100/64 | HR 92 | Temp 98.6°F | Resp 14 | Ht 64.0 in | Wt 240.0 lb

## 2019-05-29 DIAGNOSIS — K219 Gastro-esophageal reflux disease without esophagitis: Secondary | ICD-10-CM

## 2019-05-29 DIAGNOSIS — F411 Generalized anxiety disorder: Secondary | ICD-10-CM | POA: Diagnosis not present

## 2019-05-29 DIAGNOSIS — F410 Panic disorder [episodic paroxysmal anxiety] without agoraphobia: Secondary | ICD-10-CM

## 2019-05-29 NOTE — Progress Notes (Signed)
Subjective:    Patient ID: Virginia [name], female    DOB: 08/17/1981, 38 y.o.   MRN: [id number]  HPI 03/20/19 Patient was originally scheduled today to be seen as a telephone visit.  I called the patient at 158.  She initially agreed to be seen by telephone.  Patient states that her heart feels like it is racing.  I asked the patient to check her pulse.  Patient states that she counted it for 10 seconds and found to be 25 beats.  This would equal a heart rate of 150 bpm.  Therefore I asked the patient to hang up the phone immediately and come to my office.  At the present time she denies any chest pain or lightheadedness or syncope or shortness of breath however she does feel extremely anxious and her heart is racing.  Patient was seen by cardiology (Dr. Ladona Ridgelaylor) last summer when an event monitor revealed runs of nonsustained ventricular tachycardia.  At that time she was started on atenolol although they did discuss possibly using Rythmol or flecainide.  She elected to up titrate atenolol first.   On arrival here, her heart rate is 84 bpm and regular. EKG reveals normal sinus rhythm with normal intervals and a normal axis.  There is no evidence of Wolff-Parkinson-White.  No evidence of Brugada syndrome.  QT interval was normal.  Patient does have inverted T waves in V1 and V2.  Despite having a normal EKG and a normal heart rate on physical exam, the patient continues to feel like her heart is racing even while we are talking.  She feels like it is pounding inside of her.  She also reports feeling tremulous.  She holds up both her hands and they do have a fine shaking tremor.  Her last thyroid test was in March or April of last year and was normal.  She is only taking Klonopin at night.  She is not taking Ativan any longer.  She has not tried Klonopin when she has 1 of these "attacks" of pounding heartbeats.  She also reports severe acid reflux.  She states that happening constantly.  She reports feeling a  burning sensation up in her throat.  She also reports occasional sharp chest pains just to the right of her sternum that come and go within a few seconds.  These seem to be unrelated to activity or racing heart rate.  She has tried Maalox/Mylanta over-the-counter and it has helped slightly with the symptoms but not completely.  She still drinking quite a few carbonated beverages.  She is not drinking any alcohol.  She does eat a lot of peppermint.  She denies eating any spicy food.  She usually sleeps on a slight incline.  At that time, my plan was: I feel that the patient is dealing with anxiety and having panic attacks manifesting physical symptoms.  I have recommended taking half a Klonopin whenever she feels like her heart rate is racing and trying this through the weekend to see if this helps control the symptoms.  If so, I would focus on better managing her anxiety by discontinuing Cymbalta and replacing it with Trintellix.  EKG today and physical exam are reassuring.  Patient's medication list shows that she is taking Ativan.  This is incorrect.  She has not using Ativan at all.  I will remove this from her medicine list.  She continues to have breakthrough acid reflux making me suspect a hiatal hernia.  I recommended avoiding caffeinated beverages  or carbonated beverages.  I recommended avoiding peppermint.  I have recommended sleeping with the head of the bed elevated.  Continue Protonix 40 mg twice a day and add Carafate 1 g p.o. q. before meals at bedtime and reassess in 3 to 4 weeks.  She will email me next week to let me know if the Klonopin is helped the "racing heart rate".  05/29/19 Patient saw no benefit on Lexapro.  Cymbalta helped however it only helped her depression.  Her anxiety persisted plus she gained weight so she discontinued the Cymbalta.  She presents today stating that she is waking up every night, quotation marks choking".  She literally feels like something stuck in her throat.   She is gasping for breath.  This then triggers panic attacks.  She also has a sore throat every night when she wakes up.  She is tried taking Protonix twice a day.  She is tried taking sucralfate 4 times a day however she often forgets to take it.  She is sleeping with the head of her bed elevated and yet the symptoms continue.  She has never had an EGD.  She has terrible heartburn on a daily basis.  She has stopped her NSAID as I directed her to.  Is been going on now for the last 2 weeks however the anxiety and depression has been going on as documented above for more than a year.  She denies any suicidal ideation.  However she states that she is scheduled to go to the beach in [place] however she has no excitement or anticipation for going to the beach because of anhedonia and anxiety Past Medical History:  Diagnosis Date  . Anxiety   . Headache   . Insomnia   . Multiple sclerosis (HCC)   . Nystagmus 02/15/2017  . Syncope and collapse    No past surgical history on file. Current Outpatient Medications on File Prior to Visit  Medication Sig Dispense Refill  . amitriptyline (ELAVIL) 50 MG tablet TAKE 1 TABLET (50 MG TOTAL) BY MOUTH AT BEDTIME AS NEEDED FOR SLEEP. 90 tablet 2  . atenolol (TENORMIN) 50 MG tablet TAKE 1 TABLET BY MOUTH EVERY DAY 90 tablet 3  . baclofen (LIORESAL) 10 MG tablet TAKE 1 TABLET (10 MG TOTAL) 3 (THREE) TIMES DAILY BY MOUTH. 90 tablet 11  . clonazePAM (KLONOPIN) 1 MG tablet TAKE 1 TABLET BY MOUTH TWICE A DAY AS NEEDED FOR ANXIETY 20 tablet 1  . diclofenac (VOLTAREN) 75 MG EC tablet TAKE 1 TABLET BY MOUTH TWICE A DAY 60 tablet 1  . gabapentin (NEURONTIN) 600 MG tablet TAKE 1 TABLET (600 MG TOTAL) BY MOUTH 4 (FOUR) TIMES DAILY. 120 tablet 5  . meclizine (ANTIVERT) 25 MG tablet Take 1 tablet (25 mg total) by mouth 3 (three) times daily as needed for dizziness. 30 tablet 0  . methylPREDNISolone (MEDROL DOSEPAK) 4 MG TBPK tablet Take 6 tablets on day 1 and decrease by 1 tablet  each day until finished 21 tablet 0  . natalizumab (TYSABRI) 300 MG/15ML injection Inject 300 mg into the vein every 28 (twenty-eight) days.    . pantoprazole (PROTONIX) 40 MG tablet TAKE 1 TABLET BY MOUTH TWICE A DAY 180 tablet 3  . sucralfate (CARAFATE) 1 g tablet Take 1 tablet (1 g total) by mouth 4 (four) times daily -  with meals and at bedtime. 120 tablet 1   No current facility-administered medications on file prior to visit.    Allergies  Allergen  Reactions  . Codeine Other (See Comments)    Hallucinations    Social History   Socioeconomic History  . Marital status: Married    Spouse name: Not on file  . Number of children: Not on file  . Years of education: Not on file  . Highest education level: Not on file  Occupational History  . Not on file  Social Needs  . Financial resource strain: Not on file  . Food insecurity    Worry: Not on file    Inability: Not on file  . Transportation needs    Medical: Not on file    Non-medical: Not on file  Tobacco Use  . Smoking status: Current Every Day Smoker    Types: E-cigarettes  . Smokeless tobacco: Never Used  Substance and Sexual Activity  . Alcohol use: Yes    Alcohol/week: 0.0 standard drinks    Comment: rarely  . Drug use: No  . Sexual activity: Not on file  Lifestyle  . Physical activity    Days per week: Not on file    Minutes per session: Not on file  . Stress: Not on file  Relationships  . Social Herbalist on phone: Not on file    Gets together: Not on file    Attends religious service: Not on file    Active member of club or organization: Not on file    Attends meetings of clubs or organizations: Not on file    Relationship status: Not on file  . Intimate partner violence    Fear of current or ex partner: Not on file    Emotionally abused: Not on file    Physically abused: Not on file    Forced sexual activity: Not on file  Other Topics Concern  . Not on file  Social History Narrative   . Not on file    Review of Systems  All other systems reviewed and are negative.      Objective:   Physical Exam Vitals signs reviewed.  Constitutional:      General: She is not in acute distress.    Appearance: Normal appearance. She is not ill-appearing, toxic-appearing or diaphoretic.  HENT:     Mouth/Throat:     Mouth: Mucous membranes are moist. No injury.     Tongue: No lesions. Tongue does not deviate from midline.     Tonsils: No tonsillar exudate or tonsillar abscesses.  Cardiovascular:     Rate and Rhythm: Normal rate and regular rhythm.     Pulses: Normal pulses.     Heart sounds: No murmur. No friction rub. No gallop.   Pulmonary:     Effort: Pulmonary effort is normal. No respiratory distress.     Breath sounds: No stridor. No wheezing, rhonchi or rales.  Chest:     Chest wall: No tenderness.  Musculoskeletal:     Right lower leg: No edema.     Left lower leg: No edema.  Neurological:     Mental Status: She is alert.  Psychiatric:        Mood and Affect: Mood is anxious.           Assessment & Plan:  The primary encounter diagnosis was Panic attacks. Diagnoses of Gastroesophageal reflux disease, esophagitis presence not specified and GAD (generalized anxiety disorder) were also pertinent to this visit. I see 2 main issues.  I believe part of the reason she is waking up choking every night could be uncontrolled  acid reflux.  I believe this could be refluxing into her airway causing her to feel like she is choking.  This could also explain a sore irritated throat.  Discontinue Protonix.  Replace with Dexilant 60 mg p.o. every morning.  Continue Carafate.  Consult GI for an EGD given the fact she has a sore throat.  She also reports difficulty swallowing meats such as hamburgers.  It seems like the food and even some of her pills are sticking.  Is possible she is developed a stricture.  Second main issue which I believe is at the heart of many of her somatic  complaints is uncontrolled anxiety and depression.  I recommended starting Trintellix 5 mg p.o. daily and then increasing to 10 mg p.o. daily in 1 week and reassessing in 1 month.

## 2019-06-05 DIAGNOSIS — G35 Multiple sclerosis: Secondary | ICD-10-CM | POA: Diagnosis not present

## 2019-06-09 ENCOUNTER — Telehealth: Payer: Self-pay | Admitting: *Deleted

## 2019-06-09 NOTE — Telephone Encounter (Signed)
Faxed completed/signed [facility] pt status report and reauth questionnaire to [facility] at [phone]. Received confirmation.  

## 2019-06-18 ENCOUNTER — Other Ambulatory Visit: Payer: Self-pay | Admitting: Neurology

## 2019-06-18 ENCOUNTER — Other Ambulatory Visit: Payer: Self-pay | Admitting: Family Medicine

## 2019-06-18 DIAGNOSIS — F411 Generalized anxiety disorder: Secondary | ICD-10-CM

## 2019-06-18 NOTE — Telephone Encounter (Signed)
Ok to refill??  Last office visit 05/29/2019.  Last refill 05/26/2019.

## 2019-06-24 ENCOUNTER — Telehealth: Payer: Self-pay | Admitting: *Deleted

## 2019-06-24 ENCOUNTER — Encounter: Payer: Self-pay | Admitting: Neurology

## 2019-06-24 ENCOUNTER — Other Ambulatory Visit: Payer: Self-pay

## 2019-06-24 ENCOUNTER — Ambulatory Visit (INDEPENDENT_AMBULATORY_CARE_PROVIDER_SITE_OTHER): Payer: BC Managed Care – PPO | Admitting: Neurology

## 2019-06-24 VITALS — BP 118/85 | HR 90 | Temp 97.5°F | Ht 64.0 in | Wt 240.2 lb

## 2019-06-24 DIAGNOSIS — G35 Multiple sclerosis: Secondary | ICD-10-CM

## 2019-06-24 DIAGNOSIS — M79672 Pain in left foot: Secondary | ICD-10-CM

## 2019-06-24 DIAGNOSIS — M79671 Pain in right foot: Secondary | ICD-10-CM

## 2019-06-24 DIAGNOSIS — Z79899 Other long term (current) drug therapy: Secondary | ICD-10-CM

## 2019-06-24 DIAGNOSIS — R5383 Other fatigue: Secondary | ICD-10-CM | POA: Diagnosis not present

## 2019-06-24 NOTE — Progress Notes (Addendum)
[facility]  PATIENT: Virginia [name] DOB: Aug 11, 1981  REFERRING DOCTOR OR PCP:    Blair Heys SOURCE: patient, admit/discharge hospital notes, imaging reports, MRI's on PACS  _________________________________   HISTORICAL  CHIEF COMPLAINT:  Chief Complaint  Patient presents with  . Follow-up    RM 13, alone. Last seen 12/24/2018. Denies any new sx.   . Multiple Sclerosis    On Tysabri. Next infusion: 07/03/2019.  Last JCV 12/24/18 negative, index: 0.19. NEEDS REPEAT LAB TODAY    HISTORY OF PRESENT ILLNESS:  Virginia [name] is a 38 y.o. woman with relapsing remitting multiple sclerosis who also has back pain.  Update 06/24/2019: She is on Tysabri for relapsing remitting MS and tolerates it well.    She is JCV antibody negative (last one 11/2018 was 0.19).    She is noting more pain in the bottom of her feet but no definite exacerbation.    Pain is worse after sitting when she stands and better when she walks around > 2 minutes.    She also has had some dizzy spells.   No new weakness and gait is the same.     Vision is stable.   Bladder is stable.  She has more fatigue in general.    She always feels tired.      She often feels a throat tickling and has had more GERD symptoms.    She has not had an EGD.   She occasionaly wakes up choking.  . She has mild OSA with AHI only 6.2.  Her LBP is about the same as last visit.   She has a disc protrusion at L5-S1 adjacent to the S1 nerve roots.  She is on baclofen, gabapentin and amitriptyline.      She has gained 40 pounds over the past year.   Her weight was 207 at the time of the sleep study in 2019 and 245 now.      Update 12/24/2018: She is on Tysabri as her disease modifying therapy for MS.  She tolerates it well.  Her last infusion was 2 months ago.  She is overdue but has had trouble due to insurance authorization issues.    She is JCV antibody negative (indeterminate at 0.23 with a negative inhibition assay).      She feels her gait is about the same but the feet hurt a lot and the left foot often goes numb.      Her left ankle is swollen since a fall (due to hr foot being numb).    She had x-rays done and was told everything looked good.   She notes mild weakness in her toes.    Arms are strong.     Bladder function is fine.   She notes mild visual fluttering at times and her ophthalmologist told her she had mild horizontal nystagmus.    She reports a lot of fatigue.     She is on several medications that may make her more sleepy including clonazepam, baclofen 10 mg po tid, amitriptyline and gabapentin.   She tried stopping gabapentin and baclofen but felt better when she did.   Amitriptyline is just used on nights she has severe insomnia.     A sleep study late last year showed an AHI = 5.7 (very mild OSA).       Update 06/20/2018: She is on Tysabri.  She tolerates the infusions well.  Her last JCV body test was negative at 0.2 (indeterminant followed by negative  inhibition assay).     She denies any new MS symptoms.   Her gait is doing a bout the same.    She is noting more numbness in her left leg.   She denies weakness.   She has urinary urgency but has no incontinence.    Vision is doing well.     She has a pulled muscle sensation in her left leg.    She sees Pain management (Dr. Wynn Banker).  An ESI did not help earlier this year.    Nothing increases or decreases her pain.      She has insomnia, both sleep onset and sleep maintenance.    Temazepam sometimes helps the sleep onset but not the sleep maintenance.      She notes spells where she feels her heart racing and she feels her eye get blurry.  These occur any time..   She did a Holter monitor and she seemed to have a VTach run.  She sees Dr. Ladona Ridgel.    She was placed on atenolol.   She does not think it has helped as she still has palpitations.     EEG 04/16/2018 was essentially normal.  There is no epileptiform activity.  There was some excess beta  activity that could be due to some of the medicines that she takes.  EPWORTH SLEEPINESS SCALE  On a scale of 0 - 3 what is the chance of dozing:  Sitting and Reading:   2 Watching TV:    2 Sitting inactive in a public place: 0 Passenger in car for one hour: 1 Lying down to rest in the afternoon: 3 Sitting and talking to someone: 1 Sitting quietly after lunch:  1 In a car, stopped in traffic:  0  Total (out of 24):    10/24 (mild)   Update 01/01/2018:  She feels that her MS has been stable. She is on Tysabri and has tolerated it well. She has not had any definite exacerbations.    She has noted fluctuating numbness in her toes and hands.   .   She has had some episodes where she feels she is having palpitations lasting minutes.    She does not know if there is any lightheadedness.     Her pulse is rapid.    This is occurring multiple times a day.    Sometimes she shakes and sometimes vision seems blurry.   This can occur in any position.     She had an ESI 4 weeks ago and it only helped slightly x 2-3 days.   She had a disc protrusion at L5-S1 that could contact the S1 nerve roots.    Pain management clinic has been contacted but she has not heard back yet.    She takes gabapentin 600 mg po qid but it has not helped her much.   She feels a little out of it at time.s    She also takes baclofen tid.      She still has a headache every day in both temples.   Moving does not change the HA.   No nausea or photophobia.      She sleeps about the same --- she has DPSD and can't sleep before 2 -3 am.   Temazepam has helped her fall asleep when she lays down. If she takes temazepam, she can't fall asleep before 2 AM.   She sometimes has light snoring.    She has episodes of waking up with a feeling  of rapid heart rate but not with snorting  She has had a tremor that is a little worse in her hands. She notes it most when she tries to write or eat or hold something  Update 09/03/2017:    She feels her  MS is stable. She is tolerating Tysabri well and has not had any exacerbations.  She continues to report low back pain and bilateral hip pain. She has cramping spasms in her calves. She also reports neck pain and shoulder pain.   She has not had much benefit from gabapentin, meloxicam and Naprosyn. Hydrocodone and steroids helped just a little bit.    She takes baclofen only at night and it does not make her that sleepy.      She has headaches about 3 days a week.   Pain is bilateral in her forehead.   She has not noted any triggers and they seem to occur randomly.    Aleve helps mildly.   She denies N/V.    She has photophobia and phonophobia.    She used to only get occasional headaches.      She has neck pain that seems distinct from the headache.    She is not sleeping well many nights and has DPSD sleeping from 3 am to 11 am.   He usually wakes up for a few minutes around 7 AM and then falls back asleep  From 07/12/2017: LBP/Leg pain/nek:  She has pian all over right now but pan started in her lower back and radiates into her legs.   Pain is independent of position and OTC med's did not help.   Baclofen has not helped.    She has milder pain in her neck.   Pain is disturbing her sleep.     MRI 12/25/2016 showed that she has mild spinal stenosis at C4-C5, C5-C6 and C6-C7 due to disc protrusions and mild uncovertebral spurring that MRI also showed two MS lesions in the spinal cord, one at C6-C7 and one at T1.   Thoracic spine MRI in 2017 did not show any significant degenerative changes. Lumbar x-rays 2016 not show any significant finding.     She also noes some arm and leg weakness.  She has no no numbness bu thas hd tingling (long term form her MS).     MS:  She has been on Tysabri since March 2018 and she tolerates it well.   She did have an exacerbation (leg numbness and nystagmus) after her first dose treated with 3 days of IV Solu-Medrol.  Before Tysabri, she was on Aubagio but had breakthrough  disease -- MRI 11/28/16 showed that she had about 7 new gadolinium enhancing lesions.  Most were small in the or deep white matter but one juxtacortical focus on the left was moderate in size.   MRI of the cervical spine February 2017 showed 2 plaques in the spinal cord, one not present in May 2017.  Gait/strength/sensation: Her balance is better so gait is doing better as well since starting Tysabri.   Strength is more symmetric now.     There is no facial weakness or numbness.   Bladder/bowel: She has noted mild urinary frequency and urgency but no incontinence. There is no constipation.  Vision: She denies blurry vision or diplopia now. She was told she had ocular flutters at last eye exam.     Vertigo:   She has occasional spells of vertigo similar to last visit.  Fatigue/sleep: She has both physical and cognitive fatigue.  She feels her insomnia is doing better on temazepam.   She was doing well with temazepam for her insomnia but sleep is worse recently due to her pain.  Mood/cognition: She denies depression and anxiety.      She denies any significant change in cognitive function. She notes some difficulty with memory, focus and verbal fluency.   MS History:   In mid May 2017, she was starting to experience occasional lightheadedness and a spinning vertigo. On 03/19/2016, she had more extreme vertigo and had an episode of syncope. She notices that when she was walking she would veer towards the right. Because of the syncope, she was taken to the emergency room. He had an MRI of the brain that showed several enhancing lesions, potentially worrisome for multiple sclerosis. Additional studies were performed. She received 3 days of IV steroids. The MRI's of the spine did not show any additional plaques. The lumbar puncture showed oligoclonal bands with normal IgG index, consistent with multiple  sclerosis.  MRI images from 03/20/2016, 03/22/2016 and CT angiogram images from 03/23/2016 were personally reviewed. The MRI of the brain shows several 2/fair hyperintense foci, some in the periventricular and juxtacortical white matter. Most of the foci enhanced after contrast administration. MRI of the cervical and thoracic spine did not show any spinal cord plaques and there was no significant degenerative change.   CT angiogram was essentially normal showing no significant stenosis.   She underwent a lumbar puncture on 03/21/2016. 4 oligoclonal bands present in the CSF which were not present in the serum, consistent with multiple sclerosis. The IgG index was high normal at 0.6.   There were only 4 white blood cells but 280 red blood cells more consistent with a slightly traumatic tap.    [facility] IgG was negative but the IgM was positive at 1.41 (less than 0.9 normal).    ANA and ANCA were both negative.    REVIEW OF SYSTEMS: Constitutional: No fevers, chills, sweats, or change in appetite.   She has fatigue. Insomnia much better with temazepam Eyes: No visual changes, double vision, eye pain Ear, nose and throat: No hearing loss, ear pain, nasal congestion, sore throat Cardiovascular: No chest pain, palpitations Respiratory: No shortness of breath at rest or with exertion.   No wheezes GastrointestinaI: No nausea, vomiting, diarrhea, abdominal pain, fecal incontinence Genitourinary: No dysuria, urinary retention.   She reports frequency.  No nocturia. Musculoskeletal: No neck pain, back pain Integumentary: No rash, pruritus, skin lesions Neurological: as above Psychiatric: No depression at this time.  No anxiety Endocrine: No palpitations, diaphoresis, change in appetite, change in weigh or increased thirst Hematologic/Lymphatic: No anemia, purpura, petechiae. Allergic/Immunologic: No itchy/runny eyes, nasal congestion, recent allergic reactions, rashes   ALLERGIES: Allergies  Allergen Reactions  . Codeine Other (See Comments)    Hallucinations     HOME MEDICATIONS:  Current Outpatient Medications:  .  amitriptyline (  ELAVIL) 50 MG tablet, TAKE 1 TABLET BY MOUTH AT BEDTIME AS NEEDED FOR SLEEP., Disp: 30 tablet, Rfl: 8 .  atenolol (TENORMIN) 50 MG tablet, TAKE 1 TABLET BY MOUTH EVERY DAY, Disp: 90 tablet, Rfl: 3 .  baclofen (LIORESAL) 10 MG tablet, TAKE 1 TABLET (10 MG TOTAL) 3 (THREE) TIMES DAILY BY MOUTH., Disp: 90 tablet, Rfl: 11 .  clonazePAM (KLONOPIN) 1 MG tablet, TAKE 1 TABLET BY MOUTH TWICE A DAY AS NEEDED FOR ANXIETY, Disp: 20 tablet, Rfl: 1 .  gabapentin (NEURONTIN) 600 MG tablet, TAKE 1 TABLET (600 MG TOTAL) BY MOUTH 4 (FOUR) TIMES DAILY., Disp: 120 tablet, Rfl: 5 .  meclizine (ANTIVERT) 25 MG tablet, TAKE 1 TABLET (25 MG TOTAL) BY MOUTH 3 (THREE) TIMES DAILY AS NEEDED FOR DIZZINESS., Disp: 30 tablet, Rfl: 0 .  methylPREDNISolone (MEDROL DOSEPAK) 4 MG TBPK tablet, Take 6 tablets on day 1 and decrease by 1 tablet each day until finished, Disp: 21 tablet, Rfl: 0 .  natalizumab (TYSABRI) 300 MG/15ML injection, Inject 300 mg into the vein every 28 (twenty-eight) days., Disp: , Rfl:  .  pantoprazole (PROTONIX) 40 MG tablet, TAKE 1 TABLET BY MOUTH TWICE A DAY, Disp: 180 tablet, Rfl: 3 .  sucralfate (CARAFATE) 1 g tablet, TAKE 1 TABLET (1 G TOTAL) BY MOUTH 4 (FOUR) TIMES DAILY - WITH MEALS AND AT BEDTIME., Disp: 120 tablet, Rfl: 1  PAST MEDICAL HISTORY: Past Medical History:  Diagnosis Date  . Anxiety   . Headache   . Insomnia   . Multiple sclerosis (HCC)   . Nystagmus 02/15/2017  . Syncope and collapse     PAST SURGICAL HISTORY: History reviewed. No pertinent surgical history.  FAMILY HISTORY: Family History  Problem Relation Age of Onset  . Emphysema Mother   . Osteoarthritis Mother   . Other Father   . ALS Maternal Grandmother     SOCIAL HISTORY:  Social History   Socioeconomic History  . Marital status: Married     Spouse name: Not on file  . Number of children: Not on file  . Years of education: Not on file  . Highest education level: Not on file  Occupational History  . Not on file  Social Needs  . Financial resource strain: Not on file  . Food insecurity    Worry: Not on file    Inability: Not on file  . Transportation needs    Medical: Not on file    Non-medical: Not on file  Tobacco Use  . Smoking status: Current Every Day Smoker    Types: E-cigarettes  . Smokeless tobacco: Never Used  Substance and Sexual Activity  . Alcohol use: Yes    Alcohol/week: 0.0 standard drinks    Comment: rarely  . Drug use: No  . Sexual activity: Not on file  Lifestyle  . Physical activity    Days per week: Not on file    Minutes per session: Not on file  . Stress: Not on file  Relationships  . Social Musician on phone: Not on file    Gets together: Not on file    Attends religious service: Not on file    Active member of club or organization: Not on file    Attends meetings of clubs or organizations: Not on file    Relationship status: Not on file  . Intimate partner violence    Fear of current or ex partner: Not on file    Emotionally abused: Not on  file    Physically abused: Not on file    Forced sexual activity: Not on file  Other Topics Concern  . Not on file  Social History Narrative  . Not on file     PHYSICAL EXAM  Vitals:   06/24/19 1529  BP: 118/85  Pulse: 90  Temp: (!) 97.5 F (36.4 C)  Weight: 240 lb 3.2 oz (109 kg)  Height: 5\' 4"  (1.626 m)    Body mass index is 41.23 kg/m.   General: The patient is well-developed and well-nourished and in no acute distress,.  Mild pedal edema on the left.  Neurologic Exam  Mental status: The patient is alert and oriented x 3 at the time of the examination. The patient has apparent normal recent and remote memory, with an apparently normal attention span and concentration ability.   Speech is normal.  Cranial  nerves: Extraocular muscles are full.  However there is some end gaze nystagmus to the left..  Facial strength and sensation is normal. Trapezius strength is normal. No obvious hearing deficits are noted.  Motor:  Muscle bulk is normal.  Strength is 5/5. Tone is mildly increased in the legs.  Sensory: She has reduced sensation to touch and vibration in the left leg relative to the right leg.  Sensation was normal in the arms.  Coordination: Cerebellar testing reveals good finger-nose-finger and heel-to-shin bilaterally.  Gait and station: Station is normal.   The gait is normal.  Tandem gait is mildly wide.  Romberg is negative.  Reflexes: Deep tendon reflexes are symmetric and normal in arms.  DTRs are brisk at knees.    There is no ankle clonus.    DIAGNOSTIC DATA (LABS, IMAGING, TESTING) - I reviewed patient records, labs, notes, testing and imaging myself where available.  Lab Results  Component Value Date   WBC 7.6 12/24/2018   HGB 13.4 12/24/2018   HCT 40.1 12/24/2018   MCV 93 12/24/2018   PLT 347 12/24/2018      Component Value Date/Time   NA 137 03/19/2016 1929   K 3.7 03/19/2016 1929   CL 110 03/19/2016 1929   CO2 18 (L) 03/19/2016 1929   GLUCOSE 100 (H) 03/19/2016 1929   BUN 16 03/19/2016 1929   CREATININE 1.04 (H) 03/19/2018 1417   CALCIUM 8.9 03/19/2016 1929   PROT 6.1 12/04/2016 1131   ALBUMIN 3.7 12/04/2016 1131   AST 11 12/04/2016 1131   ALT 13 12/04/2016 1131   ALKPHOS 73 12/04/2016 1131   BILITOT <0.2 12/04/2016 1131   GFRNONAA >60 03/19/2018 1417   GFRAA >60 03/19/2018 1417    Lab Results  Component Value Date   TSH 1.010 01/01/2018       ASSESSMENT AND PLAN    1. Multiple sclerosis ([place])   2. High risk medication use   3. Bilateral foot pain   4. Other fatigue     1.    Continue Tysabri 300 mg every 4 weeks.  Check JCV antibody and CBC. 2.   Etiology of foot pain is unclear.  She does have some tenderness and pain is worse upon  standing.  We will check x-rays of the feet to determine if there are heel spurs or other abnormalities.  If present, consider referral to podiatry.  Pain could also be due to to her possible S1 radiculopathies.  MS is less likely given the positional element.   3.  Continue gabapentin 600 mg 3-4 times a day 4.    Continue  other medications. 5.   Return 5-6 months.  Call sooner if there are new or worsening neurologic symptoms.  Delle Andrzejewski A. Epimenio FootSater, MD, PhD 06/24/2019, 6:25 PM Certified in Neurology, Clinical Neurophysiology, Sleep Medicine, Pain Medicine and Neuroimaging  [facility] [address], [address] [place], [place] [address] [phone]

## 2019-06-24 NOTE — Telephone Encounter (Signed)
Placed JCV lab in [facility] lock box for routine lab pick up. Results pending. 

## 2019-06-25 LAB — CBC WITH DIFFERENTIAL/PLATELET
Basophils Absolute: 0.1 10*3/uL (ref 0.0–0.2)
Basos: 1 %
EOS (ABSOLUTE): 0.8 10*3/uL — ABNORMAL HIGH (ref 0.0–0.4)
Eos: 6 %
Hematocrit: 36.7 % (ref 34.0–46.6)
Hemoglobin: 12.7 g/dL (ref 11.1–15.9)
Immature Grans (Abs): 0.1 10*3/uL (ref 0.0–0.1)
Immature Granulocytes: 1 %
Lymphocytes Absolute: 4.5 10*3/uL — ABNORMAL HIGH (ref 0.7–3.1)
Lymphs: 35 %
MCH: 31.1 pg (ref 26.6–33.0)
MCHC: 34.6 g/dL (ref 31.5–35.7)
MCV: 90 fL (ref 79–97)
Monocytes Absolute: 1.1 10*3/uL — ABNORMAL HIGH (ref 0.1–0.9)
Monocytes: 8 %
NRBC: 1 % — ABNORMAL HIGH (ref 0–0)
Neutrophils Absolute: 6.2 10*3/uL (ref 1.4–7.0)
Neutrophils: 49 %
Platelets: 332 10*3/uL (ref 150–450)
RBC: 4.08 x10E6/uL (ref 3.77–5.28)
RDW: 13.9 % (ref 11.7–15.4)
WBC: 12.7 10*3/uL — ABNORMAL HIGH (ref 3.4–10.8)

## 2019-06-30 NOTE — Telephone Encounter (Signed)
JCV ab drawn 06/24/2019 negative, index: 0.19.

## 2019-07-01 ENCOUNTER — Ambulatory Visit (INDEPENDENT_AMBULATORY_CARE_PROVIDER_SITE_OTHER): Payer: BC Managed Care – PPO | Admitting: Gastroenterology

## 2019-07-01 ENCOUNTER — Other Ambulatory Visit: Payer: Self-pay

## 2019-07-01 ENCOUNTER — Encounter: Payer: Self-pay | Admitting: Gastroenterology

## 2019-07-01 VITALS — BP 106/80 | HR 85 | Temp 98.4°F | Ht 64.0 in | Wt 239.0 lb

## 2019-07-01 DIAGNOSIS — R110 Nausea: Secondary | ICD-10-CM | POA: Diagnosis not present

## 2019-07-01 DIAGNOSIS — R1013 Epigastric pain: Secondary | ICD-10-CM

## 2019-07-01 DIAGNOSIS — G8929 Other chronic pain: Secondary | ICD-10-CM | POA: Diagnosis not present

## 2019-07-01 DIAGNOSIS — R12 Heartburn: Secondary | ICD-10-CM

## 2019-07-01 NOTE — Progress Notes (Signed)
[facility] Consult Note:  History: Virginia [name] 07/01/2019  Referring provider: [name], MD  Reason for consult/chief complaint: Abdominal Pain (pt reports mid abdomina pain as well as burning in her esophagus; various gerd medications have provided minimal relief; ) and Nausea (pt reports ongoing nausea which affects her appetite)   Subjective  HPI:  This is a very pleasant 38 year old woman referred by primary care for 4 to 6 months of periumbilic abdominal pain, heartburn and nausea.  Different PPIs have been of minimal relief.  The nausea is affecting her appetite with associated bloating.  Bowel habits for many years of tended to alternate between constipation, regularity and then loose stool. She denies dysphagia or odynophagia. She has throat discomfort and "tickling" with a need to clear her throat.  She does not feel she has postnasal drip or sinus congestion.  There are nocturnal episodes where she awakens feeling that she is choking, with heart racing and shortness of breath.  Primary care is been evaluating her for this as well, has some concerns there may be an element of panic attack.  Some recent changes to her psychiatric meds been made.  Neurology follow-up note from 06/24/2019 regarding her multiple sclerosis.  Provider made note of throat discomfort GERD symptoms and also 40 pound weight gain in the last year.  ROS:  Review of Systems  Constitutional: Negative for appetite change and unexpected weight change.  HENT: Negative for mouth sores and voice change.   Eyes: Negative for pain and redness.  Respiratory: Negative for cough and shortness of breath.   Cardiovascular: Negative for chest pain and palpitations.  Genitourinary: Negative for dysuria and hematuria.  Musculoskeletal: Negative for arthralgias and myalgias.  Skin: Negative for pallor and rash.  Neurological: Positive for headaches. Negative for weakness.       Pain and  tingling in the feet from MS.  She reports symptoms have been relatively stable since starting Tysabri.  Hematological: Negative for adenopathy.     Past Medical History: Past Medical History:  Diagnosis Date  . Anxiety   . Headache   . Insomnia   . Multiple sclerosis (HCC)   . Nystagmus 02/15/2017  . Syncope and collapse      Past Surgical History: History reviewed. No pertinent surgical history.   Family History: Family History  Problem Relation Age of Onset  . Emphysema Mother   . Osteoarthritis Mother   . Other Father   . ALS Maternal Grandmother     Social History: Social History   Socioeconomic History  . Marital status: Married    Spouse name: Not on file  . Number of children: Not on file  . Years of education: Not on file  . Highest education level: Not on file  Occupational History  . Not on file  Social Needs  . Financial resource strain: Not on file  . Food insecurity    Worry: Not on file    Inability: Not on file  . Transportation needs    Medical: Not on file    Non-medical: Not on file  Tobacco Use  . Smoking status: Current Every Day Smoker    Types: E-cigarettes  . Smokeless tobacco: Never Used  Substance and Sexual Activity  . Alcohol use: Yes    Alcohol/week: 0.0 standard drinks    Comment: rarely  . Drug use: No  . Sexual activity: Not on file  Lifestyle  . Physical activity    Days per week: Not  on file    Minutes per session: Not on file  . Stress: Not on file  Relationships  . Social Herbalist on phone: Not on file    Gets together: Not on file    Attends religious service: Not on file    Active member of club or organization: Not on file    Attends meetings of clubs or organizations: Not on file    Relationship status: Not on file  Other Topics Concern  . Not on file  Social History Narrative  . Not on file   + Vaping   Allergies: Allergies  Allergen Reactions  . Codeine Other (See Comments)     Hallucinations     Outpatient Meds: Current Outpatient Medications  Medication Sig Dispense Refill  . amitriptyline (ELAVIL) 50 MG tablet TAKE 1 TABLET BY MOUTH AT BEDTIME AS NEEDED FOR SLEEP. 30 tablet 8  . atenolol (TENORMIN) 50 MG tablet TAKE 1 TABLET BY MOUTH EVERY DAY 90 tablet 3  . baclofen (LIORESAL) 10 MG tablet TAKE 1 TABLET (10 MG TOTAL) 3 (THREE) TIMES DAILY BY MOUTH. 90 tablet 11  . clonazePAM (KLONOPIN) 1 MG tablet TAKE 1 TABLET BY MOUTH TWICE A DAY AS NEEDED FOR ANXIETY 20 tablet 1  . gabapentin (NEURONTIN) 600 MG tablet TAKE 1 TABLET (600 MG TOTAL) BY MOUTH 4 (FOUR) TIMES DAILY. 120 tablet 5  . meclizine (ANTIVERT) 25 MG tablet TAKE 1 TABLET (25 MG TOTAL) BY MOUTH 3 (THREE) TIMES DAILY AS NEEDED FOR DIZZINESS. 30 tablet 0  . methylPREDNISolone (MEDROL DOSEPAK) 4 MG TBPK tablet Take 6 tablets on day 1 and decrease by 1 tablet each day until finished 21 tablet 0  . natalizumab (TYSABRI) 300 MG/15ML injection Inject 300 mg into the vein every 28 (twenty-eight) days.    . pantoprazole (PROTONIX) 40 MG tablet TAKE 1 TABLET BY MOUTH TWICE A DAY 180 tablet 3  . sucralfate (CARAFATE) 1 g tablet TAKE 1 TABLET (1 G TOTAL) BY MOUTH 4 (FOUR) TIMES DAILY - WITH MEALS AND AT BEDTIME. 120 tablet 1   No current facility-administered medications for this visit.       ___________________________________________________________________ Objective   Exam:  BP 106/80   Pulse 85   Temp 98.4 F (36.9 C)   Ht 5\' 4"  (1.626 m)   Wt 239 lb (108.4 kg)   BMI 41.02 kg/m    General: Well-appearing, pleasant and conversational, normal vocal quality  Eyes: sclera anicteric, no redness  ENT: oral mucosa moist without lesions, no cervical or supraclavicular lymphadenopathy  CV: RRR without murmur, S1/S2, no JVD, no peripheral edema  Resp: clear to auscultation bilaterally, normal RR and effort noted  GI: soft, epigastric tenderness, with active bowel sounds. No guarding or palpable  organomegaly noted.  Skin; warm and dry, no rash or jaundice noted  Neuro: awake, alert and oriented x 3. Normal gross motor function and fluent speech  Labs:  CBC Latest Ref Rng & Units 06/24/2019 12/24/2018 06/20/2018  WBC 3.4 - 10.8 x10E3/uL 12.7(H) 7.6 7.9  Hemoglobin 11.1 - 15.9 g/dL 12.7 13.4 12.7  Hematocrit 34.0 - 46.6 % 36.7 40.1 37.9  Platelets 150 - 450 x10E3/uL 332 347 335   CMP Latest Ref Rng & Units 03/19/2018 12/04/2016 11/21/2016  Glucose 65 - 99 mg/dL - - -  BUN 6 - 20 mg/dL - - -  Creatinine 0.44 - 1.00 mg/dL 1.04(H) - -  Sodium 135 - 145 mmol/L - - -  Potassium 3.5 -  5.1 mmol/L - - -  Chloride 101 - 111 mmol/L - - -  CO2 22 - 32 mmol/L - - -  Calcium 8.9 - 10.3 mg/dL - - -  Total Protein 6.0 - 8.5 g/dL - 6.1 7.0  Total Bilirubin 0.0 - 1.2 mg/dL - <1.6<0.2 0.2  Alkaline Phos 39 - 117 IU/L - 73 82  AST 0 - 40 IU/L - 11 13  ALT 0 - 32 IU/L - 13 14    Assessment: Encounter Diagnoses  Name Primary?  . Nausea in adult Yes  . Abdominal pain, chronic, epigastric   . Heartburn     Possible GERD, but not much better on PPI.  Epigastric discomfort and nausea as well.  Consider gastritis, H. pylori, less likely malignancy.  Possible functional element.  Plan:  EGD.  She is agreeable after discussion of procedure and risks.  The benefits and risks of the planned procedure were described in detail with the patient or (when appropriate) their health care proxy.  Risks were outlined as including, but not limited to, bleeding, infection, perforation, adverse medication reaction leading to cardiac or pulmonary decompensation.  The limitation of incomplete mucosal visualization was also discussed.  No guarantees or warranties were given.   Thank you for the courtesy of this consult.  Please call me with any questions or concerns.  Charlie PitterHenry L Danis III  CC: Referring provider noted above

## 2019-07-01 NOTE — Patient Instructions (Signed)
If you are age 39 or older, your body mass index should be between 23-30. Your Body mass index is 41.02 kg/m. If this is out of the aforementioned range listed, please consider follow up with your Primary Care Provider.  If you are age 44 or younger, your body mass index should be between 19-25. Your Body mass index is 41.02 kg/m. If this is out of the aformentioned range listed, please consider follow up with your Primary Care Provider.   You have been scheduled for an endoscopy. Please follow written instructions given to you at your visit today. If you use inhalers (even only as needed), please bring them with you on the day of your procedure.  It was a pleasure to see you today!  Dr. Loletha Carrow

## 2019-07-03 DIAGNOSIS — G35 Multiple sclerosis: Secondary | ICD-10-CM | POA: Diagnosis not present

## 2019-07-04 ENCOUNTER — Other Ambulatory Visit: Payer: Self-pay

## 2019-07-04 ENCOUNTER — Encounter: Payer: Self-pay | Admitting: Family Medicine

## 2019-07-04 ENCOUNTER — Ambulatory Visit
Admission: RE | Admit: 2019-07-04 | Discharge: 2019-07-04 | Disposition: A | Discharge status: Home / Self Care | Payer: BC Managed Care – PPO | Source: Ambulatory Visit | Attending: Neurology | Admitting: Neurology

## 2019-07-04 DIAGNOSIS — M7732 Calcaneal spur, left foot: Secondary | ICD-10-CM | POA: Diagnosis not present

## 2019-07-04 DIAGNOSIS — M79671 Pain in right foot: Secondary | ICD-10-CM

## 2019-07-04 DIAGNOSIS — M79672 Pain in left foot: Secondary | ICD-10-CM

## 2019-07-08 ENCOUNTER — Telehealth: Payer: Self-pay | Admitting: *Deleted

## 2019-07-08 NOTE — Telephone Encounter (Signed)
-----   Message from Britt Bottom, MD sent at 07/05/2019  5:33 PM EDT ----- Please let her know that the x-ray of the right foot was normal.  The x-ray of the left foot showed a small heel spur (calcaneal spur).  If her foot is still bothering her she should see podiatry

## 2019-07-09 ENCOUNTER — Encounter: Payer: Self-pay | Admitting: Gastroenterology

## 2019-07-11 ENCOUNTER — Other Ambulatory Visit: Payer: Self-pay | Admitting: Family Medicine

## 2019-07-11 DIAGNOSIS — F411 Generalized anxiety disorder: Secondary | ICD-10-CM

## 2019-07-11 NOTE — Telephone Encounter (Signed)
Ok to refill??  Last office visit 05/29/2019.  Last refill 06/19/2019.

## 2019-07-14 ENCOUNTER — Encounter: Payer: Self-pay | Admitting: Gastroenterology

## 2019-07-14 ENCOUNTER — Other Ambulatory Visit: Payer: Self-pay

## 2019-07-14 ENCOUNTER — Ambulatory Visit (AMBULATORY_SURGERY_CENTER): Payer: BC Managed Care – PPO | Admitting: Gastroenterology

## 2019-07-14 VITALS — BP 93/49 | HR 91 | Temp 97.8°F | Resp 16 | Ht 64.0 in | Wt 239.0 lb

## 2019-07-14 DIAGNOSIS — R1013 Epigastric pain: Secondary | ICD-10-CM

## 2019-07-14 DIAGNOSIS — K200 Eosinophilic esophagitis: Secondary | ICD-10-CM

## 2019-07-14 DIAGNOSIS — R110 Nausea: Secondary | ICD-10-CM

## 2019-07-14 DIAGNOSIS — K228 Other specified diseases of esophagus: Secondary | ICD-10-CM

## 2019-07-14 DIAGNOSIS — K209 Esophagitis, unspecified: Secondary | ICD-10-CM | POA: Diagnosis not present

## 2019-07-14 DIAGNOSIS — K2289 Other specified disease of esophagus: Secondary | ICD-10-CM

## 2019-07-14 DIAGNOSIS — K219 Gastro-esophageal reflux disease without esophagitis: Secondary | ICD-10-CM | POA: Diagnosis not present

## 2019-07-14 MED ORDER — SODIUM CHLORIDE 0.9 % IV SOLN: 500.0000 mL | Freq: Once | INTRAVENOUS | Status: DC

## 2019-07-14 NOTE — Progress Notes (Signed)
Called to room to assist during endoscopic procedure.  Patient ID and intended procedure confirmed with present staff. Received instructions for my participation in the procedure from the performing physician.  

## 2019-07-14 NOTE — Progress Notes (Signed)
No egg or soy allergy  Pt's states no medical or surgical changes since previsit or office visit. 

## 2019-07-14 NOTE — Op Note (Signed)
[facility] Patient Name: Virginia FuchsRacheal [name] Procedure Date: 07/14/2019 9:49 AM MRN: [id number] Endoscopist: Sherilyn CooterHenry L. Myrtie [name] , MD Age: 38 Referring MD:  Date of Birth: 04/07/1981 Gender: Female Account #: [id number] Procedure:                Upper GI endoscopy Indications:              Epigastric abdominal pain, Nausea (at least several                            months, not much improved on PPI - has MS on                            Tysabri) Medicines:                Monitored Anesthesia Care Procedure:                Pre-Anesthesia Assessment:                           - Prior to the procedure, a History and Physical                            was performed, and patient medications and                            allergies were reviewed. The patient's tolerance of                            previous anesthesia was also reviewed. The risks                            and benefits of the procedure and the sedation                            options and risks were discussed with the patient.                            All questions were answered, and informed consent                            was obtained. Prior Anticoagulants: The patient has                            taken no previous anticoagulant or antiplatelet                            agents. ASA Grade Assessment: II - A patient with                            mild systemic disease. After reviewing the risks                            and benefits, the patient was deemed in  satisfactory condition to undergo the procedure.                           After obtaining informed consent, the endoscope was                            passed under direct vision. Throughout the                            procedure, the patient's blood pressure, pulse, and                            oxygen saturations were monitored continuously. The                            Endoscope was introduced through the mouth, and                            advanced to the second part of duodenum. The upper                            GI endoscopy was accomplished without difficulty.                            The patient tolerated the procedure well. Scope In: Scope Out: Findings:                 The larynx was normal. However, there were thick                            secretions.                           Diffuse mucosal changes characterized by                            granularity were found in the middle third of the                            esophagus and in the lower third of the esophagus.                            Biopsies were taken with a cold forceps for                            histology.                           The exam of the esophagus was otherwise normal.                           The entire examined stomach was normal. Biopsies                            were taken with a cold forceps for histology.                            (  Sidney protocol).                           The cardia and gastric fundus were normal on                            retroflexion.                           The examined duodenum was normal. Complications:            No immediate complications. Estimated Blood Loss:     Estimated blood loss was minimal. Impression:               - Normal larynx with secretions.                           - Granular mucosa in the esophagus. Biopsied.                           - Normal stomach. Biopsied.                           - Normal examined duodenum. Recommendation:           - Patient has a contact number available for                            emergencies. The signs and symptoms of potential                            delayed complications were discussed with the                            patient. Return to normal activities tomorrow.                            Written discharge instructions were provided to the                            patient.                           -  Resume previous diet.                           - Continue present medications.                           - Await pathology results. If unrevealing, plan CT                            scan abdomen. Virginia L. Myrtie Neitheranis, MD 07/14/2019 10:27:30 AM This report has been signed electronically.

## 2019-07-14 NOTE — Patient Instructions (Signed)
Await pathology results.  If unrevealing, plan CT scan of abdomen.  YOU HAD AN ENDOSCOPIC PROCEDURE TODAY AT [facility]:   Refer to the procedure report that was given to you for any specific questions about what was found during the examination.  If the procedure report does not answer your questions, please call your gastroenterologist to clarify.  If you requested that your care partner not be given the details of your procedure findings, then the procedure report has been included in a sealed envelope for you to review at your convenience later.  YOU SHOULD EXPECT: Some feelings of bloating in the abdomen. Passage of more gas than usual.  Walking can help get rid of the air that was put into your GI tract during the procedure and reduce the bloating. If you had a lower endoscopy (such as a colonoscopy or flexible sigmoidoscopy) you may notice spotting of blood in your stool or on the toilet paper. If you underwent a bowel prep for your procedure, you may not have a normal bowel movement for a few days.  Please Note:  You might notice some irritation and congestion in your nose or some drainage.  This is from the oxygen used during your procedure.  There is no need for concern and it should clear up in a day or so.  SYMPTOMS TO REPORT IMMEDIATELY:    Following upper endoscopy (EGD)  Vomiting of blood or coffee ground material  New chest pain or pain under the shoulder blades  Painful or persistently difficult swallowing  New shortness of breath  Fever of 100F or higher  Black, tarry-looking stools  For urgent or emergent issues, a gastroenterologist can be reached at any hour by calling [phone].   DIET:  We do recommend a small meal at first, but then you may proceed to your regular diet.  Drink plenty of fluids but you should avoid alcoholic beverages for 24 hours.  ACTIVITY:  You should plan to take it easy for the rest of today and you should NOT DRIVE or use  heavy machinery until tomorrow (because of the sedation medicines used during the test).    FOLLOW UP: Our staff will call the number listed on your records 48-72 hours following your procedure to check on you and address any questions or concerns that you may have regarding the information given to you following your procedure. If we do not reach you, we will leave a message.  We will attempt to reach you two times.  During this call, we will ask if you have developed any symptoms of COVID 19. If you develop any symptoms (ie: fever, flu-like symptoms, shortness of breath, cough etc.) before then, please call [phone].  If you test positive for Covid 19 in the 2 weeks post procedure, please call and report this information to [place].    If any biopsies were taken you will be contacted by phone or by letter within the next 1-3 weeks.  Please call us at [phone] if you have not heard about the biopsies in 3 weeks.    SIGNATURES/CONFIDENTIALITY: You and/or your care partner have signed paperwork which will be entered into your electronic medical record.  These signatures attest to the fact that that the information above on your After Visit Summary has been reviewed and is understood.  Full responsibility of the confidentiality of this discharge information lies with you and/or your care-partner.

## 2019-07-16 ENCOUNTER — Telehealth: Payer: Self-pay

## 2019-07-16 ENCOUNTER — Other Ambulatory Visit: Payer: Self-pay | Admitting: Neurology

## 2019-07-16 NOTE — Telephone Encounter (Signed)
  Follow up Call-  Call back number 07/14/2019  Post procedure Call Back phone  # [phone]  Permission to leave phone message Yes  Some recent data might be hidden     Patient questions:  Do you have a fever, pain , or abdominal swelling? No. Pain Score  0 *  Have you tolerated food without any problems? Yes.    Have you been able to return to your normal activities? Yes.    Do you have any questions about your discharge instructions: Diet   No. Medications  No. Follow up visit  No.  Do you have questions or concerns about your Care? No.  Actions: * If pain score is 4 or above: No action needed, pain <4.  1. Have you developed a fever since your procedure? No.  2.   Have you had an respiratory symptoms (SOB or cough) since your procedure? No.  3.   Have you tested positive for COVID 19 since your procedure No.  4.   Have you had any family members/close contacts diagnosed with the COVID 19 since your procedure?  No.   If yes to any of these questions please route to Joylene John, RN and Alphonsa Gin, RN.

## 2019-07-18 ENCOUNTER — Other Ambulatory Visit: Payer: Self-pay | Admitting: *Deleted

## 2019-07-18 DIAGNOSIS — R110 Nausea: Secondary | ICD-10-CM

## 2019-07-21 ENCOUNTER — Other Ambulatory Visit: Payer: Self-pay

## 2019-07-21 ENCOUNTER — Encounter: Payer: Self-pay | Admitting: Family Medicine

## 2019-07-21 ENCOUNTER — Ambulatory Visit (INDEPENDENT_AMBULATORY_CARE_PROVIDER_SITE_OTHER): Payer: BC Managed Care – PPO | Admitting: Family Medicine

## 2019-07-21 VITALS — BP 100/74 | HR 100 | Temp 98.3°F | Resp 16 | Ht 64.0 in | Wt 239.0 lb

## 2019-07-21 DIAGNOSIS — M722 Plantar fascial fibromatosis: Secondary | ICD-10-CM | POA: Diagnosis not present

## 2019-07-21 NOTE — Progress Notes (Signed)
Subjective:    Patient ID: Virginia [name], female    DOB: 01-Jan-1981, 38 y.o.   MRN: [id number]  HPI  Patient presents today complaining of bilateral foot pain.  Pain is located over the plantar aspect of the calcaneus.  Pain is worse in the left foot compared to the right foot.  Patient saw her neurologist who performed x-rays of her feet.  There was a small bone spur on the calcaneus seen on the left however the remainder of the x-ray was normal as well as the x-ray of the right foot.  Today she reports pain that is worse over the plantar aspect of the calcaneus in both feet.  It stabs when she puts pressure on it when she walks in the morning.  Gentle palpation of the plantar aspect of the calcaneus elicits severe pain and causes her to jump.  However she also reports shooting pains in between her first and second toes on her left foot, neuropathic pain in the webspace in that area, as well as pain radiating from the heel to the balls of the foot bilaterally.  I believe the majority of her pain could be plantar fasciitis however I also believe some of her pain is neuropathic in nature.  The appearance of the feet are normal bilaterally.  There is no erythema or swelling or discoloration.  There is no sign of infection.  She has normal pulses in both feet.  She has normal sensation to vibration and temperature in both feet as well as to 10 g monofilament. Past Medical History:  Diagnosis Date   Anxiety    Headache    Insomnia    Multiple sclerosis (HCC)    Nystagmus 02/15/2017   Syncope and collapse    No past surgical history on file. Current Outpatient Medications on File Prior to Visit  Medication Sig Dispense Refill   amitriptyline (ELAVIL) 50 MG tablet TAKE 1 TABLET BY MOUTH AT BEDTIME AS NEEDED FOR SLEEP. 30 tablet 8   atenolol (TENORMIN) 50 MG tablet TAKE 1 TABLET BY MOUTH EVERY DAY 90 tablet 3   baclofen (LIORESAL) 10 MG tablet TAKE 1 TABLET (10 MG TOTAL) 3 (THREE) TIMES DAILY  BY MOUTH. 90 tablet 11   clonazePAM (KLONOPIN) 1 MG tablet TAKE 1 TABLET BY MOUTH TWICE A DAY AS NEEDED FOR ANXIETY 20 tablet 1   gabapentin (NEURONTIN) 600 MG tablet TAKE 1 TABLET (600 MG TOTAL) BY MOUTH 4 (FOUR) TIMES DAILY. 120 tablet 5   meclizine (ANTIVERT) 25 MG tablet TAKE 1 TABLET (25 MG TOTAL) BY MOUTH 3 (THREE) TIMES DAILY AS NEEDED FOR DIZZINESS. 30 tablet 0   natalizumab (TYSABRI) 300 MG/15ML injection Inject 300 mg into the vein every 28 (twenty-eight) days.     pantoprazole (PROTONIX) 40 MG tablet TAKE 1 TABLET BY MOUTH TWICE A DAY 180 tablet 3   sucralfate (CARAFATE) 1 g tablet TAKE 1 TABLET (1 G TOTAL) BY MOUTH 4 (FOUR) TIMES DAILY - WITH MEALS AND AT BEDTIME. 120 tablet 1   No current facility-administered medications on file prior to visit.    Allergies  Allergen Reactions   Codeine Other (See Comments)    Hallucinations    Social History   Socioeconomic History   Marital status: Married    Spouse name: Not on file   Number of children: Not on file   Years of education: Not on file   Highest education level: Not on file  Occupational History   Not on file  Social Designer, fashion/clothing strain: Not on file   Food insecurity    Worry: Not on file    Inability: Not on file   Transportation needs    Medical: Not on file    Non-medical: Not on file  Tobacco Use   Smoking status: Current Every Day Smoker    Types: E-cigarettes   Smokeless tobacco: Never Used  Substance and Sexual Activity   Alcohol use: Yes    Alcohol/week: 0.0 standard drinks    Comment: rarely   Drug use: No   Sexual activity: Not on file  Lifestyle   Physical activity    Days per week: Not on file    Minutes per session: Not on file   Stress: Not on file  Relationships   Social connections    Talks on phone: Not on file    Gets together: Not on file    Attends religious service: Not on file    Active member of club or organization: Not on file    Attends  meetings of clubs or organizations: Not on file    Relationship status: Not on file   Intimate partner violence    Fear of current or ex partner: Not on file    Emotionally abused: Not on file    Physically abused: Not on file    Forced sexual activity: Not on file  Other Topics Concern   Not on file  Social History Narrative   Not on file      Review of Systems  All other systems reviewed and are negative.      Objective:   Physical Exam Vitals signs reviewed.  Cardiovascular:     Rate and Rhythm: Normal rate and regular rhythm.  Pulmonary:     Effort: Pulmonary effort is normal.     Breath sounds: Normal breath sounds.  Musculoskeletal:     Right foot: Normal range of motion and normal capillary refill. Tenderness and bony tenderness present. No swelling, crepitus, deformity or laceration.     Left foot: Normal range of motion and normal capillary refill. Tenderness and bony tenderness present. No swelling, crepitus, deformity or laceration.       Feet:           Assessment & Plan:  Plantar fasciitis, bilateral  I believe her pain is due to bilateral plantar fasciitis.  However I believe some of her pain is likely neuropathic in nature as well.  We discussed options and the patient agrees to receive a cortisone injection.  Using sterile technique, I injected the insertion of the plantar fascia on the left calcaneus with a mixture of 1 cc of 0.1% lidocaine without epinephrine and 1 cc of 40 mg/mL Kenalog.  The patient tolerated the procedure well without complication.  I have recommended reassessing in 1 week.  If her pain is better, I would recommend a referral to podiatry to discuss treatment strategies to help prevent this from returning in the future including orthotics.  I have recommended that she stop wearing flip-flops and sandals which is her primary footwear.  She could also return at anytime for an injection in her right heel
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
## 2019-07-30 ENCOUNTER — Ambulatory Visit (HOSPITAL_COMMUNITY)
Admission: RE | Admit: 2019-07-30 | Discharge: 2019-07-30 | Disposition: A | Discharge status: Home / Self Care | Payer: BC Managed Care – PPO | Source: Ambulatory Visit | Attending: Gastroenterology | Admitting: Gastroenterology

## 2019-07-30 ENCOUNTER — Other Ambulatory Visit: Payer: Self-pay

## 2019-07-30 DIAGNOSIS — R109 Unspecified abdominal pain: Secondary | ICD-10-CM | POA: Diagnosis not present

## 2019-07-30 DIAGNOSIS — R110 Nausea: Secondary | ICD-10-CM

## 2019-07-30 MED ORDER — IOHEXOL 300 MG/ML  SOLN
100.0000 mL | Freq: Once | INTRAMUSCULAR | Status: AC | PRN
  Administered 2019-07-30: 100 mL via INTRAVENOUS

## 2019-07-31 ENCOUNTER — Other Ambulatory Visit: Payer: Self-pay | Admitting: Family Medicine

## 2019-07-31 DIAGNOSIS — F411 Generalized anxiety disorder: Secondary | ICD-10-CM

## 2019-07-31 NOTE — Telephone Encounter (Signed)
.  Requesting refill  Klonopin  LOV: 07/21/19  LRF:  07/11/19

## 2019-08-01 ENCOUNTER — Encounter (HOSPITAL_COMMUNITY): Payer: Self-pay | Admitting: Psychiatry

## 2019-08-01 ENCOUNTER — Other Ambulatory Visit: Payer: Self-pay

## 2019-08-01 ENCOUNTER — Encounter: Payer: Self-pay | Admitting: Family Medicine

## 2019-08-01 ENCOUNTER — Ambulatory Visit (INDEPENDENT_AMBULATORY_CARE_PROVIDER_SITE_OTHER): Payer: BC Managed Care – PPO | Admitting: Psychiatry

## 2019-08-01 DIAGNOSIS — F410 Panic disorder [episodic paroxysmal anxiety] without agoraphobia: Secondary | ICD-10-CM | POA: Diagnosis not present

## 2019-08-01 DIAGNOSIS — F320 Major depressive disorder, single episode, mild: Secondary | ICD-10-CM | POA: Diagnosis not present

## 2019-08-01 MED ORDER — VENLAFAXINE HCL ER 37.5 MG PO CP24: 37.5000 mg | ORAL_CAPSULE | Freq: Every day | ORAL | 0 refills | Status: DC

## 2019-08-01 MED ORDER — VENLAFAXINE HCL ER 75 MG PO CP24: ORAL_CAPSULE | ORAL | 0 refills | Status: DC

## 2019-08-01 NOTE — Patient Instructions (Signed)
1. Start venlafaxine 37.5 mg daily for one week, then 75 mg daily  2. Discontinue Amitriptyline  3. Continue clonazepam 1 mg at night and 0.5 mg daily prn for anxiety 4. Referral to therapy 5. Next appointment: 10/28 at 11:30

## 2019-08-11 DIAGNOSIS — G35 Multiple sclerosis: Secondary | ICD-10-CM | POA: Diagnosis not present

## 2019-08-16 ENCOUNTER — Other Ambulatory Visit: Payer: Self-pay | Admitting: Neurology

## 2019-08-17 ENCOUNTER — Other Ambulatory Visit: Payer: Self-pay | Admitting: Family Medicine
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
## 2019-08-26 ENCOUNTER — Other Ambulatory Visit: Payer: Self-pay | Admitting: Family Medicine

## 2019-08-26 DIAGNOSIS — F411 Generalized anxiety disorder: Secondary | ICD-10-CM

## 2019-08-26 NOTE — Telephone Encounter (Signed)
Ok to refill??  Last office visit 07/21/2019.  Last refill 07/31/2019.

## 2019-08-27 ENCOUNTER — Ambulatory Visit (INDEPENDENT_AMBULATORY_CARE_PROVIDER_SITE_OTHER): Payer: BC Managed Care – PPO | Admitting: Psychiatry

## 2019-08-27 ENCOUNTER — Other Ambulatory Visit: Payer: Self-pay

## 2019-08-27 ENCOUNTER — Encounter (HOSPITAL_COMMUNITY): Payer: Self-pay | Admitting: Psychiatry

## 2019-08-27 DIAGNOSIS — F320 Major depressive disorder, single episode, mild: Secondary | ICD-10-CM | POA: Diagnosis not present

## 2019-08-27 DIAGNOSIS — F410 Panic disorder [episodic paroxysmal anxiety] without agoraphobia: Secondary | ICD-10-CM | POA: Diagnosis not present

## 2019-08-27 MED ORDER — VENLAFAXINE HCL ER 75 MG PO CP24: 75.0000 mg | ORAL_CAPSULE | Freq: Every day | ORAL | 1 refills | Status: DC

## 2019-08-27 NOTE — Patient Instructions (Signed)
1. Continue venlafaxine 75 mg daily  2  Continue clonazepam 1 mg at nightand 0.5 mg daily prn for anxiety  3. Next appointment: December

## 2019-09-03 ENCOUNTER — Encounter (HOSPITAL_COMMUNITY): Payer: Self-pay | Admitting: Licensed Clinical Social Worker

## 2019-09-03 ENCOUNTER — Other Ambulatory Visit: Payer: Self-pay

## 2019-09-03 ENCOUNTER — Ambulatory Visit (INDEPENDENT_AMBULATORY_CARE_PROVIDER_SITE_OTHER): Payer: BC Managed Care – PPO | Admitting: Licensed Clinical Social Worker

## 2019-09-03 DIAGNOSIS — F410 Panic disorder [episodic paroxysmal anxiety] without agoraphobia: Secondary | ICD-10-CM
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
## 2019-09-09 DIAGNOSIS — G35 Multiple sclerosis: Secondary | ICD-10-CM | POA: Diagnosis not present

## 2019-09-18 ENCOUNTER — Other Ambulatory Visit: Payer: Self-pay | Admitting: Family Medicine

## 2019-09-18 DIAGNOSIS — F411 Generalized anxiety disorder: Secondary | ICD-10-CM

## 2019-09-18 NOTE — Telephone Encounter (Signed)
Ok to refill??  Last office visit 07/21/2019.  Last refill 08/26/2019.

## 2019-10-08 DIAGNOSIS — G35 Multiple sclerosis: Secondary | ICD-10-CM | POA: Diagnosis not present

## 2019-10-12 ENCOUNTER — Other Ambulatory Visit: Payer: Self-pay | Admitting: Family Medicine

## 2019-10-12 DIAGNOSIS — F411 Generalized anxiety disorder: Secondary | ICD-10-CM

## 2019-10-13 NOTE — Telephone Encounter (Signed)
Requesting refill  Klonopin  LOV:  07/21/19  LRF:  09/18/19  #20/1

## 2019-10-21 ENCOUNTER — Telehealth (HOSPITAL_COMMUNITY): Payer: Self-pay | Admitting: Clinical

## 2019-10-21 ENCOUNTER — Ambulatory Visit (INDEPENDENT_AMBULATORY_CARE_PROVIDER_SITE_OTHER): Payer: BC Managed Care – PPO | Admitting: Clinical

## 2019-10-21 ENCOUNTER — Ambulatory Visit (HOSPITAL_COMMUNITY): Payer: BC Managed Care – PPO | Admitting: Licensed Clinical Social Worker

## 2019-10-21 ENCOUNTER — Other Ambulatory Visit: Payer: Self-pay

## 2019-10-21 DIAGNOSIS — F410 Panic disorder [episodic paroxysmal anxiety] without agoraphobia: Secondary | ICD-10-CM | POA: Diagnosis not present

## 2019-10-21 DIAGNOSIS — F320 Major depressive disorder, single episode, mild: Secondary | ICD-10-CM

## 2019-10-21 NOTE — Progress Notes (Signed)
  Virtual Visit via Video Note  I connected with Virginia [name] on 10/21/19 at 11:00 AM EST by a video enabled telemedicine application and verified that I am speaking with the correct person using two identifiers.  Location: Patient:Home Provider: Office   I discussed the limitations of evaluation and management by telemedicine and the availability of in person appointments. The patient expressed understanding and agreed to proceed.    THERAPIST PROGRESS NOTE Session Time: 9:10AM-9:40AM  Participation Level: Active  Behavioral Response: CasualAlertDepressed  Type of Therapy: Individual Therapy  Treatment Goals addressed: Coping  Interventions: CBT  Summary: Virginia [name] is a 38 y.o. female who presents with Panic Disorder and Depression. The patient meet with the OPT therapist via video app (Doxy Me) for her initial scheduled OPT treatment session. The OPT therapist in this session worked with the client utilizing Motivational Interviewing to assist in gaining feedback and building theraputic repore. The patient was involved in her session and was able to give feedback about her prior week in relation to triggers, symptoms, and behaviors. The OPT therapist worked with the patient utilizing in session CBT exercise of cognitive restructuring. The patient idenitfied that her current difficulty with sleep is a triggering factor for her mood and effects her functioning and interaction with others. The OPT therapist worked with the client on coping strategies and reviewed with the patient her Medication Therapy as well as her upcoming Med Management appointment.   Suicidal/Homicidal: Nowithout intent/plan  Therapist Response:  The OPT therapist did meet with the patent for her intial scheduled OPT session. The patient was oriented x5 and was engaged in her session working in collaboration with the [place] therapist in providing feedback and completing a In session CBT exercise. The patient during  coping skills review noted, " I like to watch movies". The patient was able to idenitify upcoming positive events that she is looking forward to. The patient during session did not verbalize S/I or H/I. The OPT therapist will continue treatment work with the client in her next scheduled session.   Plan: Return again in 2 weeks.  Diagnosis: Axis I: Panic Disorder    Axis II: MDD   I discussed the assessment and treatment plan with the patient. The patient was provided an opportunity to ask questions and all were answered. The patient agreed with the plan and demonstrated an understanding of the instructions.   The patient was advised to call back or seek an in-person evaluation if the symptoms worsen or if the condition fails to improve as anticipated.  I provided 30 minutes of non-face-to-face time during this encounter.    Lennox Grumbles, LCSW 10/21/2019

## 2019-10-21 NOTE — Telephone Encounter (Signed)
The OPT therapist attempted text to session x2 @ 11:00AM and 11:10AM. The patient did not respond to either attempt

## 2019-10-27 ENCOUNTER — Ambulatory Visit (HOSPITAL_COMMUNITY): Payer: BC Managed Care – PPO | Admitting: Psychiatry

## 2019-11-03 ENCOUNTER — Other Ambulatory Visit: Payer: Self-pay | Admitting: Family Medicine

## 2019-11-03 ENCOUNTER — Other Ambulatory Visit (HOSPITAL_COMMUNITY): Payer: Self-pay | Admitting: Psychiatry

## 2019-11-03 DIAGNOSIS — F411 Generalized anxiety disorder: Secondary | ICD-10-CM

## 2019-11-03 MED ORDER — VENLAFAXINE HCL ER 75 MG PO CP24: 75.0000 mg | ORAL_CAPSULE | Freq: Every day | ORAL | 1 refills | Status: DC

## 2019-11-03 NOTE — Telephone Encounter (Signed)
Ok to refill??  Last office visit 07/21/2019.  Last refill 10/13/2019, #1 refill.

## 2019-11-04 ENCOUNTER — Ambulatory Visit (HOSPITAL_COMMUNITY): Payer: BC Managed Care – PPO | Admitting: Licensed Clinical Social Worker

## 2019-11-05 ENCOUNTER — Other Ambulatory Visit: Payer: Self-pay | Admitting: Family Medicine

## 2019-11-11 ENCOUNTER — Ambulatory Visit (INDEPENDENT_AMBULATORY_CARE_PROVIDER_SITE_OTHER): Payer: BC Managed Care – PPO | Admitting: Psychiatry

## 2019-11-11 ENCOUNTER — Other Ambulatory Visit: Payer: Self-pay

## 2019-11-11 ENCOUNTER — Encounter: Payer: Self-pay | Admitting: Psychiatry

## 2019-11-11 DIAGNOSIS — F410 Panic disorder [episodic paroxysmal anxiety] without agoraphobia: Secondary | ICD-10-CM | POA: Insufficient documentation

## 2019-11-11 DIAGNOSIS — F320 Major depressive disorder, single episode, mild: Secondary | ICD-10-CM

## 2019-11-11 MED ORDER — SERTRALINE HCL 50 MG PO TABS: 50.0000 mg | ORAL_TABLET | Freq: Every day | ORAL | 1 refills | Status: DC

## 2019-11-11 NOTE — Progress Notes (Signed)
BH MD/PA/NP OP Progress Note  I connected with  Virginia [name] on 11/11/19 by a video enabled telemedicine application and verified that I am speaking with the correct person using two identifiers.   I discussed the limitations of evaluation and management by telemedicine. The patient expressed understanding and agreed to proceed.    11/11/2019 12:56 PM Virginia [name]  MRN:  [id number]  Chief Complaint: " I am not doing too well."  HPI: Pt reported that she continues to feel anxious and has panic episodes. She tried venlafaxine but could not tolerate it and discontinued taking it after a few days. She stated that it made her very sleepy and tired all the time.  She reported that she takes Klonopin as needed and it does help her with anxiety but she still continues to feel anxious most of the time.   EMR was reviewed, it was noted that she was taking Cymbalta in the past patient stated that Cymbalta did help her but it made her gain excessive amount of weight and therefore she would not like to go back on it. She has been tried on Lexapro in the past but could not recall if it helped or not.  She also reported she is taking Lamictal back and did not find it to be helpful.  Patient was agreeable to a trial of sertraline to help with the anxiety symptoms.  She informed that she has been dealing with a lot of dizziness these days due to her MS exacerbation.  Visit Diagnosis:    ICD-10-CM   1. Panic disorder  F41.0 sertraline (ZOLOFT) 50 MG tablet  2. Current mild episode of major depressive disorder without prior episode (HCC)  F32.0 sertraline (ZOLOFT) 50 MG tablet    Past Psychiatric History: anxiety, panic d/o, depression  Past Medical History:  Past Medical History:  Diagnosis Date  . Anxiety   . Headache   . Insomnia   . Multiple sclerosis ([place])   . Nystagmus 02/15/2017  . Syncope and collapse    No past surgical history on file.  Family Psychiatric History: denied  Family  History:  Family History  Problem Relation Age of Onset  . Emphysema Mother   . Osteoarthritis Mother   . Anxiety disorder Mother   . Other Father   . Suicidality Father   . ALS Maternal Grandmother   . Colon cancer Neg Hx   . Colon polyps Neg Hx   . Esophageal cancer Neg Hx   . Rectal cancer Neg Hx   . Stomach cancer Neg Hx     Social History:  Social History   Socioeconomic History  . Marital status: Married    Spouse name: Not on file  . Number of children: Not on file  . Years of education: Not on file  . Highest education level: Not on file  Occupational History  . Not on file  Tobacco Use  . Smoking status: Current Every Day Smoker    Types: E-cigarettes  . Smokeless tobacco: Never Used  Substance and Sexual Activity  . Alcohol use: Yes    Alcohol/week: 0.0 standard drinks    Comment: rarely  . Drug use: No  . Sexual activity: Not on file  Other Topics Concern  . Not on file  Social History Narrative  . Not on file   Social Determinants of Health   Financial Resource Strain:   . Difficulty of Paying Living Expenses: Not on file  Food Insecurity:   . Worried About  Running Out of Food in the Last Year: Not on file  . Ran Out of Food in the Last Year: Not on file  Transportation Needs:   . Lack of Transportation (Medical): Not on file  . Lack of Transportation (Non-Medical): Not on file  Physical Activity:   . Days of Exercise per Week: Not on file  . Minutes of Exercise per Session: Not on file  Stress:   . Feeling of Stress : Not on file  Social Connections:   . Frequency of Communication with Friends and Family: Not on file  . Frequency of Social Gatherings with Friends and Family: Not on file  . Attends Religious Services: Not on file  . Active Member of Clubs or Organizations: Not on file  . Attends Banker Meetings: Not on file  . Marital Status: Not on file    Allergies:  Allergies  Allergen Reactions  . Codeine Other (See  Comments)    Hallucinations   . Onion     Metabolic Disorder Labs: No results found for: HGBA1C, MPG No results found for: PROLACTIN No results found for: CHOL, TRIG, HDL, CHOLHDL, VLDL, LDLCALC Lab Results  Component Value Date   TSH 1.010 01/01/2018   TSH 1.400 04/25/2017    Therapeutic Level Labs: No results found for: LITHIUM No results found for: VALPROATE No components found for:  CBMZ  Current Medications: Current Outpatient Medications  Medication Sig Dispense Refill  . atenolol (TENORMIN) 50 MG tablet TAKE 1 TABLET BY MOUTH EVERY DAY 30 tablet 11  . baclofen (LIORESAL) 10 MG tablet TAKE 1 TABLET (10 MG TOTAL) 3 (THREE) TIMES DAILY BY MOUTH. 90 tablet 11  . clonazePAM (KLONOPIN) 1 MG tablet TAKE 1 TABLET BY MOUTH TWICE A DAY AS NEEDED FOR ANXIETY 20 tablet 1  . gabapentin (NEURONTIN) 600 MG tablet TAKE 1 TABLET (600 MG TOTAL) BY MOUTH 4 (FOUR) TIMES DAILY. 120 tablet 5  . meclizine (ANTIVERT) 25 MG tablet TAKE 1 TABLET (25 MG TOTAL) BY MOUTH 3 (THREE) TIMES DAILY AS NEEDED FOR DIZZINESS. 30 tablet 0  . natalizumab (TYSABRI) 300 MG/15ML injection Inject 300 mg into the vein every 28 (twenty-eight) days.    . pantoprazole (PROTONIX) 40 MG tablet TAKE 1 TABLET BY MOUTH TWICE A DAY 180 tablet 3  . sertraline (ZOLOFT) 50 MG tablet Take 1 tablet (50 mg total) by mouth daily. 30 tablet 1  . sucralfate (CARAFATE) 1 g tablet TAKE 1 TABLET BY MOUTH 4 TIMES DAILY - WITH MEALS AND AT BEDTIME. 120 tablet 1   No current facility-administered medications for this visit.     Musculoskeletal: Strength & Muscle Tone: unable to assess due to telemed visit Gait & Station: unable to assess due to telemed visit Patient leans: unable to assess due to telemed visit  Psychiatric Specialty Exam: Review of Systems  There were no vitals taken for this visit.There is no height or weight on file to calculate BMI.  General Appearance: Fairly Groomed  Eye Contact:  Good  Speech:  Clear and  Coherent and Normal Rate  Volume:  Normal  Mood:  Euthymic  Affect:  Congruent  Thought Process:  Goal Directed, Linear and Descriptions of Associations: Intact  Orientation:  Full (Time, Place, and Person)  Thought Content: Logical   Suicidal Thoughts:  No  Homicidal Thoughts:  No  Memory:  Recent;   Good Remote;   Good  Judgement:  Fair  Insight:  Fair  Psychomotor Activity:  Normal  Concentration:  Concentration: Good and Attention Span: Good  Recall:  Good  Fund of Knowledge: Good  Language: Good  Akathisia:  Negative  Handed:  Right  AIMS (if indicated): not done  Assets:  Communication Skills Desire for Improvement Financial Resources/Insurance Housing  ADL's:  Intact  Cognition: WNL  Sleep:  Fair   Screenings: PHQ2-9     Office Visit from 07/15/2018 in [facility] Office Visit from 06/10/2018 in [facility] Office Visit from 01/28/2018 in [facility]  PHQ-2 Total Score  0  0  1  PHQ-9 Total Score  --  --  8       Assessment and Plan: Continues to report feeling anxious and reported excessive sedation with venlafaxine so stopped taking it.  She was offered Zoloft. Potential side effects of medication and risks vs benefits of treatment vs non-treatment were explained and discussed. All questions were answered.  1. Panic disorder  - Start sertraline (ZOLOFT) 50 MG tablet; Take 1 tablet (50 mg total) by mouth daily.  Dispense: 30 tablet; Refill: 1 -Continue lorazepam 1 mg twice daily as needed prescribed by PCP.  2. Current mild episode of major depressive disorder without prior episode (HCC)  - Start sertraline (ZOLOFT) 50 MG tablet; Take 1 tablet (50 mg total) by mouth daily.  Dispense: 30 tablet; Refill: 1   F/up with Dr. Vanetta Shawl in 6 weeks.  Zena Amos, MD 11/11/2019, 12:56 PM

## 2019-11-13 ENCOUNTER — Ambulatory Visit (INDEPENDENT_AMBULATORY_CARE_PROVIDER_SITE_OTHER): Payer: BC Managed Care – PPO | Admitting: Clinical

## 2019-11-13 ENCOUNTER — Other Ambulatory Visit: Payer: Self-pay

## 2019-11-13 DIAGNOSIS — F410 Panic disorder [episodic paroxysmal anxiety] without agoraphobia: Secondary | ICD-10-CM | POA: Diagnosis not present

## 2019-11-13 DIAGNOSIS — F320 Major depressive disorder, single episode, mild: Secondary | ICD-10-CM | POA: Diagnosis not present

## 2019-11-13 NOTE — Progress Notes (Signed)
Virtual Visit via Video Note  I connected with Virginia [name] on 11/13/19 at  3:00 PM EST by a video enabled telemedicine application and verified that I am speaking with the correct person using two identifiers.  Location: Patient: Home Provider: Office   I discussed the limitations of evaluation and management by telemedicine and the availability of in person appointments. The patient expressed understanding and agreed to proceed.        THERAPIST PROGRESS NOTE  Session Time: 3:00PM-3:35PM  Participation Level: Active  Behavioral Response: CasualAlertDepressed  Type of Therapy: Individual Therapy  Treatment Goals addressed: Coping  Interventions: CBT  Summary: Virginia [name] is a 39 y.o. female who presents with Panic Disorder and Major Depressive Disorder w/o prior episode.The OPT therapist worked with the client for her scheduled OPT session. The OPT therapist utilized Motivational Interviewing to assist in creating therapeutic repore. The patient in the session was engaged and work in collaboration giving feedback about her triggers and symptoms over the past few weeks. The patient identified ongoing physical health struggles impacting her mental health.The OPT therapist utilized Cognitive Behavioral Therapy through cognitive restructuring as well as worked with the patient on coping strategies to meet her Person Centered Plan goal. The OPT therapist inquired for holistic care about the patients adherence to medication therapy.   Suicidal/Homicidal: Nowithout intent/plan  Therapist Response: The OPT therapist worked with the patient for the patients i scheduled session. The patient was engaged in his session and gave feedback in relation to triggers, symptoms, and behavior responses over the past 2 weeks. The OPT therapist worked with the patient utilizing an in session Cognitive Behavioral Therapy exercise. The patient was responsive in the session and verbalized, " I have set  personal goals to work out more and eat healthier and be healthier as well as stop having so many panic attacks". The patient indicated both compliance and effectiveness in relation to her current medication therapy. The OPT therapist will continue treatment work with the patient in her next scheduled session  Plan: Return again in 2/3 weeks.  Diagnosis: Axis I: Panic Disorder and Major Depressive Disorder w/o prior episode    Axis II: No diagnosis  I discussed the assessment and treatment plan with the patient. The patient was provided an opportunity to ask questions and all were answered. The patient agreed with the plan and demonstrated an understanding of the instructions.   The patient was advised to call back or seek an in-person evaluation if the symptoms worsen or if the condition fails to improve as anticipated.  I provided 35 minutes of non-face-to-face time during this encounter.  Winfred Burn, LCSW 11/13/2019

## 2019-11-18 ENCOUNTER — Telehealth: Payer: Self-pay | Admitting: *Deleted

## 2019-11-18 NOTE — Telephone Encounter (Signed)
Received fax from [facility] that Tysabri 300mg /43ml vial, 12m approved. Ref number: [id number]. Effective dates. 11/10/19-11/18/20. Gave copy to [facility] for their records.

## 2019-11-24 DIAGNOSIS — G35 Multiple sclerosis: Secondary | ICD-10-CM | POA: Diagnosis not present

## 2019-12-06 ENCOUNTER — Other Ambulatory Visit: Payer: Self-pay | Admitting: Family Medicine

## 2019-12-06 DIAGNOSIS — F411 Generalized anxiety disorder: Secondary | ICD-10-CM

## 2019-12-08 ENCOUNTER — Telehealth: Payer: Self-pay | Admitting: *Deleted

## 2019-12-08 NOTE — Telephone Encounter (Signed)
Faxed completed/signed [facility] pt status report and reauth questionnaire to [facility] at [phone]. Received confirmation.  

## 2019-12-08 NOTE — Telephone Encounter (Signed)
Ok to refill??  Last office visit 07/21/2019.  Last refill 11/03/2019. #1 refills.

## 2019-12-17 NOTE — Progress Notes (Signed)
Virtual Visit via Video Note  I connected with Marcena Cundy on 12/23/19 at  1:20 PM EST by a video enabled telemedicine application and verified that I am speaking with the correct person using two identifiers.   I discussed the limitations of evaluation and management by telemedicine and the availability of in person appointments. The patient expressed understanding and agreed to proceed.     I discussed the assessment and treatment plan with the patient. The patient was provided an opportunity to ask questions and all were answered. The patient agreed with the plan and demonstrated an understanding of the instructions.   The patient was advised to call back or seek an in-person evaluation if the symptoms worsen or if the condition fails to improve as anticipated.  I provided 15 minutes of non-face-to-face time during this encounter.   Neysa Hotter, MD    [facility] MD/PA/NP OP Progress Note  12/23/2019 1:42 PM Meghen Akopyan  MRN:  [id number]  Chief Complaint:  Chief Complaint    Anxiety; Follow-up; Depression     HPI:  -Since the last visit, venlafaxine was switched to sertraline at the encounter with Dr. Evelene Croon. This is a follow-up appointment for depression and anxiety.  She states that she does not feel well due to headache.  She usually has headache after infusion for MS, which she received yesterday. She constantly feels fatigue and has not been able to do things. She tends to be worried about her mother for her condition and her son who recently lost his job. She talks with her mother every day. She does not do color as much compared to before. She enjoys reading. She had a good time visiting her friend with her husband a few days ago.  She has initial and middle insomnia.  She feels fatigued.  She has fair concentration.  She has fair appetite.  She denies SI.  She feels anxious and tense.  She has panic attacks without significant trigger at least once a day.  She takes a walk with  her dog 5-10 mins every day. She had hypersomnia from venlafaxine. She denies side effect from sertraline.    Visit Diagnosis:    ICD-10-CM   1. Insomnia, unspecified type  G47.00   2. Panic disorder  F41.0 sertraline (ZOLOFT) 100 MG tablet  3. Current mild episode of major depressive disorder without prior episode (HCC)  F32.0 sertraline (ZOLOFT) 100 MG tablet    Past Psychiatric History: Please see initial evaluation for full details. I have reviewed the history. No updates at this time.     Past Medical History:  Past Medical History:  Diagnosis Date  . Anxiety   . Headache   . Insomnia   . Multiple sclerosis (HCC)   . Nystagmus 02/15/2017  . Syncope and collapse    No past surgical history on file.  Family Psychiatric History: Please see initial evaluation for full details. I have reviewed the history. No updates at this time.     Family History:  Family History  Problem Relation Age of Onset  . Emphysema Mother   . Osteoarthritis Mother   . Anxiety disorder Mother   . Other Father   . Suicidality Father   . ALS Maternal Grandmother   . Colon cancer Neg Hx   . Colon polyps Neg Hx   . Esophageal cancer Neg Hx   . Rectal cancer Neg Hx   . Stomach cancer Neg Hx     Social History:  Social History  Socioeconomic History  . Marital status: Married    Spouse name: Not on file  . Number of children: Not on file  . Years of education: Not on file  . Highest education level: Not on file  Occupational History  . Not on file  Tobacco Use  . Smoking status: Current Every Day Smoker    Types: E-cigarettes  . Smokeless tobacco: Never Used  Substance and Sexual Activity  . Alcohol use: Yes    Alcohol/week: 0.0 standard drinks    Comment: rarely  . Drug use: No  . Sexual activity: Not on file  Other Topics Concern  . Not on file  Social History Narrative  . Not on file   Social Determinants of Health   Financial Resource Strain:   . Difficulty of Paying  Living Expenses: Not on file  Food Insecurity:   . Worried About Charity fundraiser in the Last Year: Not on file  . Ran Out of Food in the Last Year: Not on file  Transportation Needs:   . Lack of Transportation (Medical): Not on file  . Lack of Transportation (Non-Medical): Not on file  Physical Activity:   . Days of Exercise per Week: Not on file  . Minutes of Exercise per Session: Not on file  Stress:   . Feeling of Stress : Not on file  Social Connections:   . Frequency of Communication with Friends and Family: Not on file  . Frequency of Social Gatherings with Friends and Family: Not on file  . Attends Religious Services: Not on file  . Active Member of Clubs or Organizations: Not on file  . Attends Archivist Meetings: Not on file  . Marital Status: Not on file    Allergies:  Allergies  Allergen Reactions  . Codeine Other (See Comments)    Hallucinations   . Onion     Metabolic Disorder Labs: No results found for: HGBA1C, MPG No results found for: PROLACTIN No results found for: CHOL, TRIG, HDL, CHOLHDL, VLDL, LDLCALC Lab Results  Component Value Date   TSH 1.010 01/01/2018   TSH 1.400 04/25/2017    Therapeutic Level Labs: No results found for: LITHIUM No results found for: VALPROATE No components found for:  CBMZ  Current Medications: Current Outpatient Medications  Medication Sig Dispense Refill  . atenolol (TENORMIN) 50 MG tablet TAKE 1 TABLET BY MOUTH EVERY DAY 30 tablet 11  . baclofen (LIORESAL) 10 MG tablet TAKE 1 TABLET (10 MG TOTAL) 3 (THREE) TIMES DAILY BY MOUTH. 90 tablet 11  . clonazePAM (KLONOPIN) 1 MG tablet TAKE 1 TABLET BY MOUTH TWICE A DAY AS NEEDED FOR ANXIETY 20 tablet 1  . gabapentin (NEURONTIN) 600 MG tablet TAKE 1 TABLET (600 MG TOTAL) BY MOUTH 4 (FOUR) TIMES DAILY. 120 tablet 5  . meclizine (ANTIVERT) 25 MG tablet TAKE 1 TABLET (25 MG TOTAL) BY MOUTH 3 (THREE) TIMES DAILY AS NEEDED FOR DIZZINESS. 30 tablet 0  . natalizumab  (TYSABRI) 300 MG/15ML injection Inject 300 mg into the vein every 28 (twenty-eight) days.    . pantoprazole (PROTONIX) 40 MG tablet TAKE 1 TABLET BY MOUTH TWICE A DAY 180 tablet 3  . sertraline (ZOLOFT) 100 MG tablet Take 1 tablet (100 mg total) by mouth daily. 30 tablet 1  . sucralfate (CARAFATE) 1 g tablet TAKE 1 TABLET BY MOUTH 4 TIMES DAILY - WITH MEALS AND AT BEDTIME. 120 tablet 1   No current facility-administered medications for this visit.  Musculoskeletal: Strength & Muscle Tone: N/A Gait & Station: N/A Patient leans: N/A  Psychiatric Specialty Exam: Review of Systems  Psychiatric/Behavioral: Positive for sleep disturbance. Negative for agitation, behavioral problems, confusion, decreased concentration, dysphoric mood, hallucinations, self-injury and suicidal ideas. The patient is nervous/anxious. The patient is not hyperactive.   All other systems reviewed and are negative.   There were no vitals taken for this visit.There is no height or weight on file to calculate BMI.  General Appearance: Fairly Groomed  Eye Contact:  Good  Speech:  Clear and Coherent  Volume:  Normal  Mood:  "tired:  Affect:  Appropriate, Congruent and fatigue  Thought Process:  Coherent  Orientation:  Full (Time, Place, and Person)  Thought Content: Logical   Suicidal Thoughts:  No  Homicidal Thoughts:  No  Memory:  Immediate;   Good  Judgement:  Good  Insight:  Good  Psychomotor Activity:  Normal  Concentration:  Concentration: Good and Attention Span: Good  Recall:  Good  Fund of Knowledge: Good  Language: Good  Akathisia:  No  Handed:  Right  AIMS (if indicated): not done  Assets:  Communication Skills Desire for Improvement  ADL's:  Intact  Cognition: WNL  Sleep:  Poor   Screenings: PHQ2-9     Office Visit from 07/15/2018 in [facility] Office Visit from 06/10/2018 in [facility] Office Visit from 01/28/2018 in [facility]  PHQ-2  Total Score  0  0  1  PHQ-9 Total Score  --  --  8       Assessment and Plan:  Shuronda Markes is a 39 y.o. year old female with a history of anxiety, depression, multiple sclerosis,  borderline sleep apnea (study in 2019) , who presents for follow up appointment for Insomnia, unspecified type  Panic disorder - Plan: sertraline (ZOLOFT) 100 MG tablet  Current mild episode of major depressive disorder without prior episode (HCC) - Plan: sertraline (ZOLOFT) 100 MG tablet  # Panic disorder # MDD, single episode, mild She continues to report panic attacks and depressive symptoms since the last visit.  Psychosocial stressors includes medical condition of MS. Will uptitrate sertraline to target anxiety and depression.  She is on clonazepam for anxiety.  Discussed behavioral activation.   # Insomnia She reports initial and middle insomnia. Discussed sleep hygiene. She takes clonazepam with good benefit. Will continue this medication.   Plan 1. Increase sertraline 100 mg daily  2. Next appointment: 4/6 at 11:30 for 30 mins, video - She is on clonazepam 1 mg at nightand 0.5 mg daily prn for anxiety - TSH checked this year, normal according to the patient - She is on gabapentin 600 mg QID, baclofen 10 mg TID,  Past trials of medication: lexapro, duloxetine (gained 40 lbs), Trintellix    The patient demonstrates the following risk factors for suicide: Chronic risk factors for suicide include:psychiatric disorder ofanxiety. Acute risk factorsfor suicide include: unemployment. Protective factorsfor this patient include: positive social support, coping skills and hope for the future. Considering these factors, the overall suicide risk at this point appears to below. Patientisappropriate for outpatient follow up.  Neysa Hotter, MD 12/23/2019, 1:42 PM

## 2019-12-22 DIAGNOSIS — G35 Multiple sclerosis: Secondary | ICD-10-CM | POA: Diagnosis not present

## 2019-12-23 ENCOUNTER — Other Ambulatory Visit: Payer: Self-pay

## 2019-12-23 ENCOUNTER — Ambulatory Visit (INDEPENDENT_AMBULATORY_CARE_PROVIDER_SITE_OTHER): Payer: BC Managed Care – PPO | Admitting: Psychiatry

## 2019-12-23 ENCOUNTER — Encounter (HOSPITAL_COMMUNITY): Payer: Self-pay | Admitting: Psychiatry

## 2019-12-23 DIAGNOSIS — G4700 Insomnia, unspecified: Secondary | ICD-10-CM | POA: Diagnosis not present

## 2019-12-23 DIAGNOSIS — F320 Major depressive disorder, single episode, mild: Secondary | ICD-10-CM

## 2019-12-23 DIAGNOSIS — F410 Panic disorder [episodic paroxysmal anxiety] without agoraphobia: Secondary | ICD-10-CM

## 2019-12-23 MED ORDER — SERTRALINE HCL 100 MG PO TABS: 100.0000 mg | ORAL_TABLET | Freq: Every day | ORAL | 1 refills | Status: DC

## 2019-12-23 NOTE — Patient Instructions (Signed)
1.Increase sertraline 100 mg daily  2. Next appointment:4/6 at 11:30

## 2019-12-25 ENCOUNTER — Other Ambulatory Visit: Payer: Self-pay

## 2019-12-25 ENCOUNTER — Encounter: Payer: Self-pay | Admitting: Neurology

## 2019-12-25 ENCOUNTER — Ambulatory Visit (INDEPENDENT_AMBULATORY_CARE_PROVIDER_SITE_OTHER): Payer: BC Managed Care – PPO | Admitting: Neurology

## 2019-12-25 ENCOUNTER — Telehealth: Payer: Self-pay | Admitting: *Deleted

## 2019-12-25 VITALS — BP 120/81 | HR 79 | Temp 97.5°F | Ht 64.0 in | Wt 232.5 lb

## 2019-12-25 DIAGNOSIS — G35 Multiple sclerosis: Secondary | ICD-10-CM

## 2019-12-25 DIAGNOSIS — R208 Other disturbances of skin sensation: Secondary | ICD-10-CM

## 2019-12-25 DIAGNOSIS — Z79899 Other long term (current) drug therapy: Secondary | ICD-10-CM | POA: Diagnosis not present

## 2019-12-25 DIAGNOSIS — H814 Vertigo of central origin: Secondary | ICD-10-CM | POA: Diagnosis not present

## 2019-12-25 MED ORDER — METHYLPREDNISOLONE 4 MG PO TABS: ORAL_TABLET | ORAL | 0 refills | Status: DC

## 2019-12-25 NOTE — Telephone Encounter (Signed)
Placed JCV lab in [facility] lock box for routine lab pick up. Results pending. 

## 2019-12-25 NOTE — Progress Notes (Signed)
[facility]  PATIENT: Virginia [name] DOB: 26-Mar-1981  REFERRING DOCTOR OR PCP:    Gaynelle Arabian SOURCE: patient, admit/discharge hospital notes, imaging reports, MRI's on PACS  _________________________________   HISTORICAL  CHIEF COMPLAINT:  Chief Complaint  Patient presents with  . Follow-up    RM 13, alone.  Last seen 06/24/2019  . Multiple Sclerosis    On Tysabri. Last JCV drawn 06/24/19 neg, 0.19. NEEDS LABS TODAY. Last infusion:12/23/19. Next infusion: 01/20/20     HISTORY OF PRESENT ILLNESS:  Virginia [name] is a 39 y.o. woman with relapsing remitting multiple sclerosis who also has back pain.  Update 12/25/2019: She is on Tysabri 300 mg q4 weeks.  Last JCV Ab was 0.19.   MRI 12/2018 was unchanged compared to 2019.   She has had no exacerbations.   She does notes some burning in her legs, right > left.   This fluctuates but can be severe at times.  She is on gabapentin 600 mg po qid.   She also gets muscle cramps in her legs.   She remains active and walks her dog daily.    She notes some jumping in her vision which is unchanged.   She notes dizzy spells (vertigo) frequently.   These last 5 to 15 minutes usually.  Sometimes she feels nauseous.  Movements may trigger a dizzy spell or worsen one in progress.   She denies hearing loss or a stuffed up sensation.   Meclizine helps some but makes her sleepy.  Bladder is fine.    She has low back pain, worse when she stands.  Sometimes she gets pain in the feet as well.  She has a disc herniation at L5-S1.  She gained weight over the last 2 years.  A change in anxiety medication helped her lose some weight.    Update 06/24/2019: She is on Tysabri for relapsing remitting MS and tolerates it well.    She is JCV antibody negative (last one 11/2018 was 0.19).    She is noting more pain in the bottom of her feet but no definite exacerbation.    Pain is worse after sitting when she stands and better when she walks around > 2  minutes.    She also has had some dizzy spells.   No new weakness and gait is the same.     Vision is stable.   Bladder is stable.  She has more fatigue in general.    She always feels tired.      She often feels a throat tickling and has had more GERD symptoms.    She has not had an EGD.   She occasionaly wakes up choking.  . She has mild OSA with AHI only 6.2.  Her LBP is about the same as last visit.   She has a disc protrusion at L5-S1 adjacent to the S1 nerve roots.  She is on baclofen, gabapentin and amitriptyline.      She has gained 40 pounds over the past year.   Her weight was 207 at the time of the sleep study in 2019 and 245 now.      Update 12/24/2018: She is on Tysabri as her disease modifying therapy for MS.  She tolerates it well.  Her last infusion was 2 months ago.  She is overdue but has had trouble due to insurance authorization issues.    She is JCV antibody negative (indeterminate at 0.23 with a negative inhibition assay).  She feels her gait is about the same but the feet hurt a lot and the left foot often goes numb.      Her left ankle is swollen since a fall (due to hr foot being numb).    She had x-rays done and was told everything looked good.   She notes mild weakness in her toes.    Arms are strong.     Bladder function is fine.   She notes mild visual fluttering at times and her ophthalmologist told her she had mild horizontal nystagmus.    She reports a lot of fatigue.     She is on several medications that may make her more sleepy including clonazepam, baclofen 10 mg po tid, amitriptyline and gabapentin.   She tried stopping gabapentin and baclofen but felt better when she did.   Amitriptyline is just used on nights she has severe insomnia.     A sleep study late last year showed an AHI = 5.7 (very mild OSA).       Update 06/20/2018: She is on Tysabri.  She tolerates the infusions well.  Her last JCV body test was negative at 0.2 (indeterminant followed by negative  inhibition assay).     She denies any new MS symptoms.   Her gait is doing a bout the same.    She is noting more numbness in her left leg.   She denies weakness.   She has urinary urgency but has no incontinence.    Vision is doing well.     She has a pulled muscle sensation in her left leg.    She sees Pain management (Dr. Wynn Banker).  An ESI did not help earlier this year.    Nothing increases or decreases her pain.      She has insomnia, both sleep onset and sleep maintenance.    Temazepam sometimes helps the sleep onset but not the sleep maintenance.      She notes spells where she feels her heart racing and she feels her eye get blurry.  These occur any time..   She did a Holter monitor and she seemed to have a VTach run.  She sees Dr. Ladona Ridgel.    She was placed on atenolol.   She does not think it has helped as she still has palpitations.     EEG 04/16/2018 was essentially normal.  There is no epileptiform activity.  There was some excess beta activity that could be due to some of the medicines that she takes.  EPWORTH SLEEPINESS SCALE  On a scale of 0 - 3 what is the chance of dozing:  Sitting and Reading:   2 Watching TV:    2 Sitting inactive in a public place: 0 Passenger in car for one hour: 1 Lying down to rest in the afternoon: 3 Sitting and talking to someone: 1 Sitting quietly after lunch:  1 In a car, stopped in traffic:  0  Total (out of 24):    10/24 (mild)   Update 01/01/2018:  She feels that her MS has been stable. She is on Tysabri and has tolerated it well. She has not had any definite exacerbations.    She has noted fluctuating numbness in her toes and hands.   .   She has had some episodes where she feels she is having palpitations lasting minutes.    She does not know if there is any lightheadedness.     Her pulse is rapid.  This is occurring multiple times a day.    Sometimes she shakes and sometimes vision seems blurry.   This can occur in any position.     She  had an ESI 4 weeks ago and it only helped slightly x 2-3 days.   She had a disc protrusion at L5-S1 that could contact the S1 nerve roots.    Pain management clinic has been contacted but she has not heard back yet.    She takes gabapentin 600 mg po qid but it has not helped her much.   She feels a little out of it at time.s    She also takes baclofen tid.      She still has a headache every day in both temples.   Moving does not change the HA.   No nausea or photophobia.      She sleeps about the same --- she has DPSD and can't sleep before 2 -3 am.   Temazepam has helped her fall asleep when she lays down. If she takes temazepam, she can't fall asleep before 2 AM.   She sometimes has light snoring.    She has episodes of waking up with a feeling of rapid heart rate but not with snorting  She has had a tremor that is a little worse in her hands. She notes it most when she tries to write or eat or hold something  Update 09/03/2017:    She feels her MS is stable. She is tolerating Tysabri well and has not had any exacerbations.  She continues to report low back pain and bilateral hip pain. She has cramping spasms in her calves. She also reports neck pain and shoulder pain.   She has not had much benefit from gabapentin, meloxicam and Naprosyn. Hydrocodone and steroids helped just a little bit.    She takes baclofen only at night and it does not make her that sleepy.      She has headaches about 3 days a week.   Pain is bilateral in her forehead.   She has not noted any triggers and they seem to occur randomly.    Aleve helps mildly.   She denies N/V.    She has photophobia and phonophobia.    She used to only get occasional headaches.      She has neck pain that seems distinct from the headache.    She is not sleeping well many nights and has DPSD sleeping from 3 am to 11 am.   He usually wakes up for a few minutes around 7 AM and then falls back asleep  From 07/12/2017: LBP/Leg pain/nek:  She has  pian all over right now but pan started in her lower back and radiates into her legs.   Pain is independent of position and OTC med's did not help.   Baclofen has not helped.    She has milder pain in her neck.   Pain is disturbing her sleep.     MRI 12/25/2016 showed that she has mild spinal stenosis at C4-C5, C5-C6 and C6-C7 due to disc protrusions and mild uncovertebral spurring that MRI also showed two MS lesions in the spinal cord, one at C6-C7 and one at T1.   Thoracic spine MRI in 2017 did not show any significant degenerative changes. Lumbar x-rays 2016 not show any significant finding.     She also noes some arm and leg weakness.  She has no no numbness bu thas hd tingling (long term form  her MS).     MS:  She has been on Tysabri since March 2018 and she tolerates it well.   She did have an exacerbation (leg numbness and nystagmus) after her first dose treated with 3 days of IV Solu-Medrol.  Before Tysabri, she was on Aubagio but had breakthrough disease -- MRI 11/28/16 showed that she had about 7 new gadolinium enhancing lesions.  Most were small in the or deep white matter but one juxtacortical focus on the left was moderate in size.   MRI of the cervical spine February 2017 showed 2 plaques in the spinal cord, one not present in May 2017.                                                                                                      Gait/strength/sensation: Her balance is better so gait is doing better as well since starting Tysabri.   Strength is more symmetric now.     There is no facial weakness or numbness.   Bladder/bowel: She has noted mild urinary frequency and urgency but no incontinence. There is no constipation.  Vision: She denies blurry vision or diplopia now. She was told she had ocular flutters at last eye exam.     Vertigo:   She has occasional spells of vertigo similar to last visit.  Fatigue/sleep: She has both physical and cognitive fatigue.  She feels her insomnia is  doing better on temazepam.   She was doing well with temazepam for her insomnia but sleep is worse recently due to her pain.  Mood/cognition: She denies depression and anxiety.      She denies any significant change in cognitive function. She notes some difficulty with memory, focus and verbal fluency.   MS History:   In mid May 2017, she was starting to experience occasional lightheadedness and a spinning vertigo. On 03/19/2016, she had more extreme vertigo and had an episode of syncope. She notices that when she was walking she would veer towards the right. Because of the syncope, she was taken to the emergency room. He had an MRI of the brain that showed several enhancing lesions, potentially worrisome for multiple sclerosis. Additional studies were performed. She received 3 days of IV steroids. The MRI's of the spine did not show any additional plaques. The lumbar puncture showed oligoclonal bands with normal IgG index, consistent with multiple sclerosis.  MRI images from 03/20/2016, 03/22/2016 and CT angiogram images from 03/23/2016 were personally reviewed. The MRI of the brain shows several 2/fair hyperintense foci, some in the periventricular and juxtacortical white matter. Most of the foci enhanced after contrast administration. MRI of the cervical and thoracic spine did not show any spinal cord plaques and there was no significant degenerative change.   CT angiogram was essentially normal showing no significant stenosis.   She underwent a lumbar puncture on 03/21/2016. 4 oligoclonal bands present in the CSF which were not present in the serum, consistent with multiple sclerosis. The IgG index was high normal at 0.6.   There were only 4 white blood cells but 280 red  blood cells more consistent with a slightly traumatic tap.    [facility] Spotted Fever CSF IgG was negative but the IgM was positive at 1.41 (less than 0.9 normal).    ANA and ANCA were both negative.    REVIEW OF  SYSTEMS: Constitutional: No fevers, chills, sweats, or change in appetite.   She has fatigue. Insomnia much better with temazepam Eyes: No visual changes, double vision, eye pain Ear, nose and throat: No hearing loss, ear pain, nasal congestion, sore throat Cardiovascular: No chest pain, palpitations Respiratory: No shortness of breath at rest or with exertion.   No wheezes GastrointestinaI: No nausea, vomiting, diarrhea, abdominal pain, fecal incontinence Genitourinary: No dysuria, urinary retention.   She reports frequency.  No nocturia. Musculoskeletal: No neck pain, back pain Integumentary: No rash, pruritus, skin lesions Neurological: as above Psychiatric: No depression at this time.  No anxiety Endocrine: No palpitations, diaphoresis, change in appetite, change in weigh or increased thirst Hematologic/Lymphatic: No anemia, purpura, petechiae. Allergic/Immunologic: No itchy/runny eyes, nasal congestion, recent allergic reactions, rashes  ALLERGIES: Allergies  Allergen Reactions  . Codeine Other (See Comments)    Hallucinations   . Onion     HOME MEDICATIONS:  Current Outpatient Medications:  .  atenolol (TENORMIN) 50 MG tablet, TAKE 1 TABLET BY MOUTH EVERY DAY, Disp: 30 tablet, Rfl: 11 .  baclofen (LIORESAL) 10 MG tablet, TAKE 1 TABLET (10 MG TOTAL) 3 (THREE) TIMES DAILY BY MOUTH., Disp: 90 tablet, Rfl: 11 .  clonazePAM (KLONOPIN) 1 MG tablet, TAKE 1 TABLET BY MOUTH TWICE A DAY AS NEEDED FOR ANXIETY, Disp: 20 tablet, Rfl: 1 .  gabapentin (NEURONTIN) 600 MG tablet, TAKE 1 TABLET (600 MG TOTAL) BY MOUTH 4 (FOUR) TIMES DAILY., Disp: 120 tablet, Rfl: 5 .  meclizine (ANTIVERT) 25 MG tablet, TAKE 1 TABLET (25 MG TOTAL) BY MOUTH 3 (THREE) TIMES DAILY AS NEEDED FOR DIZZINESS., Disp: 30 tablet, Rfl: 0 .  natalizumab (TYSABRI) 300 MG/15ML injection, Inject 300 mg into the vein every 28 (twenty-eight) days., Disp: , Rfl:  .  pantoprazole (PROTONIX) 40 MG tablet, TAKE 1 TABLET BY MOUTH  TWICE A DAY, Disp: 180 tablet, Rfl: 3 .  sertraline (ZOLOFT) 100 MG tablet, Take 1 tablet (100 mg total) by mouth daily., Disp: 30 tablet, Rfl: 1 .  sucralfate (CARAFATE) 1 g tablet, TAKE 1 TABLET BY MOUTH 4 TIMES DAILY - WITH MEALS AND AT BEDTIME., Disp: 120 tablet, Rfl: 1 .  methylPREDNISolone (MEDROL) 4 MG tablet, Taper from 6 pills po for one day to 1 pill po the last day over 6 days, Disp: 21 tablet, Rfl: 0  PAST MEDICAL HISTORY: Past Medical History:  Diagnosis Date  . Anxiety   . Headache   . Insomnia   . Multiple sclerosis (HCC)   . Nystagmus 02/15/2017  . Syncope and collapse     PAST SURGICAL HISTORY: History reviewed. No pertinent surgical history.  FAMILY HISTORY: Family History  Problem Relation Age of Onset  . Emphysema Mother   . Osteoarthritis Mother   . Anxiety disorder Mother   . Other Father   . Suicidality Father   . ALS Maternal Grandmother   . Colon cancer Neg Hx   . Colon polyps Neg Hx   . Esophageal cancer Neg Hx   . Rectal cancer Neg Hx   . Stomach cancer Neg Hx     SOCIAL HISTORY:  Social History   Socioeconomic History  . Marital status: Married    Spouse name:  Not on file  . Number of children: Not on file  . Years of education: Not on file  . Highest education level: Not on file  Occupational History  . Not on file  Tobacco Use  . Smoking status: Current Every Day Smoker    Types: E-cigarettes  . Smokeless tobacco: Never Used  Substance and Sexual Activity  . Alcohol use: Yes    Alcohol/week: 0.0 standard drinks    Comment: rarely  . Drug use: No  . Sexual activity: Not on file  Other Topics Concern  . Not on file  Social History Narrative  . Not on file   Social Determinants of Health   Financial Resource Strain:   . Difficulty of Paying Living Expenses: Not on file  Food Insecurity:   . Worried About Programme researcher, broadcasting/film/video in the Last Year: Not on file  . Ran Out of Food in the Last Year: Not on file  Transportation  Needs:   . Lack of Transportation (Medical): Not on file  . Lack of Transportation (Non-Medical): Not on file  Physical Activity:   . Days of Exercise per Week: Not on file  . Minutes of Exercise per Session: Not on file  Stress:   . Feeling of Stress : Not on file  Social Connections:   . Frequency of Communication with Friends and Family: Not on file  . Frequency of Social Gatherings with Friends and Family: Not on file  . Attends Religious Services: Not on file  . Active Member of Clubs or Organizations: Not on file  . Attends Banker Meetings: Not on file  . Marital Status: Not on file  Intimate Partner Violence:   . Fear of Current or Ex-Partner: Not on file  . Emotionally Abused: Not on file  . Physically Abused: Not on file  . Sexually Abused: Not on file     PHYSICAL EXAM  Vitals:   12/25/19 1430  BP: 120/81  Pulse: 79  Temp: (!) 97.5 F (36.4 C)  Weight: 232 lb 8 oz (105.5 kg)  Height: 5\' 4"  (1.626 m)    Body mass index is 39.91 kg/m.   General: The patient is well-developed and well-nourished and in no acute distress,.  Mild pedal edema on the left.   She has cerumen in right ear canal partially blocking view of TM.    Neurologic Exam  Mental status: The patient is alert and oriented x 3 at the time of the examination. The patient has apparent normal recent and remote memory, with an apparently normal attention span and concentration ability.   Speech is normal.  Cranial nerves: Extraocular muscles are full.  However there is some end gaze nystagmus to the left..  Facial strength and sensation is normal. Trapezius strength is normal. No obvious hearing deficits are noted.  Motor:  Muscle bulk is normal.  Strength is 5/5. Tone is mildly increased in the legs.  Sensory: She had intact sensation to touch and vibration. Coordination: Cerebellar testing reveals good finger-nose-finger and heel-to-shin bilaterally.  Gait and station: Station is  normal.   The gait is normal.  Tandem gait is mildly wide.  Romberg is negative.  Reflexes: Deep tendon reflexes are symmetric and normal in arms.  DTRs are brisk at knees but no ankle clonus.    DIAGNOSTIC DATA (LABS, IMAGING, TESTING) - I reviewed patient records, labs, notes, testing and imaging myself where available.  Lab Results  Component Value Date   WBC 12.7 (  H) 06/24/2019   HGB 12.7 06/24/2019   HCT 36.7 06/24/2019   MCV 90 06/24/2019   PLT 332 06/24/2019      Component Value Date/Time   NA 137 03/19/2016 1929   K 3.7 03/19/2016 1929   CL 110 03/19/2016 1929   CO2 18 (L) 03/19/2016 1929   GLUCOSE 100 (H) 03/19/2016 1929   BUN 16 03/19/2016 1929   CREATININE 1.04 (H) 03/19/2018 1417   CALCIUM 8.9 03/19/2016 1929   PROT 6.1 12/04/2016 1131   ALBUMIN 3.7 12/04/2016 1131   AST 11 12/04/2016 1131   ALT 13 12/04/2016 1131   ALKPHOS 73 12/04/2016 1131   BILITOT <0.2 12/04/2016 1131   GFRNONAA >60 03/19/2018 1417   GFRAA >60 03/19/2018 1417    Lab Results  Component Value Date   TSH 1.010 01/01/2018       ASSESSMENT AND PLAN    1. MS (multiple sclerosis) (HCC)   2. Dysesthesia   3. High risk medication use   4. Multiple sclerosis (HCC)   5. Vertigo of central origin     1.    Continue Tysabri 300 mg every 4 weeks.  Check JCV antibody and CBC. 2.  Lower leg pain is likely coming from her degenerative changes.  The distribution is more L5 while the MRI shows more potential issues at S1 to either side.  Consider ESI if pain worsens.   3.  Continue gabapentin 600 mg 3-4 times a day 4.    Continue other medications. 5.   Etiology of the dizziness not clear.  Symptoms could be from her MS and we will check an MRI of the brain.  Could also be peripheral.  She is advised to use earwax drops on the right.   6.   Return 5-6 months.  Call sooner if there are new or worsening neurologic symptoms.  Zack Crager A. Epimenio Foot, MD, PhD 12/25/2019, 4:31 PM Certified in  Neurology, Clinical Neurophysiology, Sleep Medicine, Pain Medicine and Neuroimaging  [facility] [address] [place], [place] [address] [phone]

## 2019-12-26 LAB — CBC WITH DIFFERENTIAL/PLATELET
Basophils Absolute: 0.1 10*3/uL (ref 0.0–0.2)
Basos: 1 %
EOS (ABSOLUTE): 0.3 10*3/uL (ref 0.0–0.4)
Eos: 4 %
Hematocrit: 41.2 % (ref 34.0–46.6)
Hemoglobin: 14.1 g/dL (ref 11.1–15.9)
Immature Grans (Abs): 0 10*3/uL (ref 0.0–0.1)
Immature Granulocytes: 1 %
Lymphocytes Absolute: 3.2 10*3/uL — ABNORMAL HIGH (ref 0.7–3.1)
Lymphs: 41 %
MCH: 31.5 pg (ref 26.6–33.0)
MCHC: 34.2 g/dL (ref 31.5–35.7)
MCV: 92 fL (ref 79–97)
Monocytes Absolute: 0.8 10*3/uL (ref 0.1–0.9)
Monocytes: 10 %
NRBC: 1 % — ABNORMAL HIGH (ref 0–0)
Neutrophils Absolute: 3.4 10*3/uL (ref 1.4–7.0)
Neutrophils: 43 %
Platelets: 308 10*3/uL (ref 150–450)
RBC: 4.48 x10E6/uL (ref 3.77–5.28)
RDW: 14.5 % (ref 11.7–15.4)
WBC: 7.7 10*3/uL (ref 3.4–10.8)

## 2020-01-01 ENCOUNTER — Encounter: Payer: Self-pay | Admitting: Family Medicine

## 2020-01-01 ENCOUNTER — Ambulatory Visit (INDEPENDENT_AMBULATORY_CARE_PROVIDER_SITE_OTHER): Payer: BC Managed Care – PPO | Admitting: Family Medicine

## 2020-01-01 ENCOUNTER — Telehealth: Payer: Self-pay | Admitting: Neurology

## 2020-01-01 ENCOUNTER — Other Ambulatory Visit: Payer: Self-pay

## 2020-01-01 VITALS — BP 124/76 | HR 80 | Temp 96.2°F | Resp 18 | Ht 64.0 in | Wt 227.0 lb

## 2020-01-01 DIAGNOSIS — H6121 Impacted cerumen, right ear: Secondary | ICD-10-CM | POA: Diagnosis not present

## 2020-01-01 NOTE — Telephone Encounter (Signed)
JCV ab drawn on 12/25/19 negative, index: 0.19

## 2020-01-01 NOTE — Progress Notes (Signed)
Subjective:    Patient ID: Virginia [name], female    DOB: 07/17/1981, 39 y.o.   MRN: [id number]  HPI Patient presents today complaining of her ear feeling stopped up on the right side.  Examination of the right auditory canal reveals a cerumen impaction occluding approximately 90% of the canal.  The patient has been using over-the-counter drops to try to remove the impaction but has been unsuccessful.  The left auditory canal is completely clear.  The tympanic membrane appears healthy, pearly gray, with no erythema or effusion.  Patient was concerned that the earwax may be causing dizziness.  I do not believe that the earwax is causing dizziness however I explained that it can affect her hearing and it can also lead occasionally to otitis externa if moisture is trapped behind the impaction.  I offered today to remove the cerumen impaction.  Past Medical History:  Diagnosis Date  . Anxiety   . Headache   . Insomnia   . Multiple sclerosis (HCC)   . Nystagmus 02/15/2017  . Syncope and collapse    No past surgical history on file. Current Outpatient Medications on File Prior to Visit  Medication Sig Dispense Refill  . atenolol (TENORMIN) 50 MG tablet TAKE 1 TABLET BY MOUTH EVERY DAY 30 tablet 11  . baclofen (LIORESAL) 10 MG tablet TAKE 1 TABLET (10 MG TOTAL) 3 (THREE) TIMES DAILY BY MOUTH. 90 tablet 11  . clonazePAM (KLONOPIN) 1 MG tablet TAKE 1 TABLET BY MOUTH TWICE A DAY AS NEEDED FOR ANXIETY 20 tablet 1  . gabapentin (NEURONTIN) 600 MG tablet TAKE 1 TABLET (600 MG TOTAL) BY MOUTH 4 (FOUR) TIMES DAILY. 120 tablet 5  . meclizine (ANTIVERT) 25 MG tablet TAKE 1 TABLET (25 MG TOTAL) BY MOUTH 3 (THREE) TIMES DAILY AS NEEDED FOR DIZZINESS. 30 tablet 0  . natalizumab (TYSABRI) 300 MG/15ML injection Inject 300 mg into the vein every 28 (twenty-eight) days.    . pantoprazole (PROTONIX) 40 MG tablet TAKE 1 TABLET BY MOUTH TWICE A DAY 180 tablet 3  . sertraline (ZOLOFT) 100 MG tablet Take 1 tablet (100  mg total) by mouth daily. 30 tablet 1  . sucralfate (CARAFATE) 1 g tablet TAKE 1 TABLET BY MOUTH 4 TIMES DAILY - WITH MEALS AND AT BEDTIME. 120 tablet 1   No current facility-administered medications on file prior to visit.   Allergies  Allergen Reactions  . Codeine Other (See Comments)    Hallucinations   . Onion    Social History   Socioeconomic History  . Marital status: Married    Spouse name: Not on file  . Number of children: Not on file  . Years of education: Not on file  . Highest education level: Not on file  Occupational History  . Not on file  Tobacco Use  . Smoking status: Current Every Day Smoker    Types: E-cigarettes  . Smokeless tobacco: Never Used  Substance and Sexual Activity  . Alcohol use: Yes    Alcohol/week: 0.0 standard drinks    Comment: rarely  . Drug use: No  . Sexual activity: Not on file  Other Topics Concern  . Not on file  Social History Narrative  . Not on file   Social Determinants of Health   Financial Resource Strain:   . Difficulty of Paying Living Expenses: Not on file  Food Insecurity:   . Worried About Programme researcher, broadcasting/film/video in the Last Year: Not on file  . Ran Out of  Food in the Last Year: Not on file  Transportation Needs:   . Lack of Transportation (Medical): Not on file  . Lack of Transportation (Non-Medical): Not on file  Physical Activity:   . Days of Exercise per Week: Not on file  . Minutes of Exercise per Session: Not on file  Stress:   . Feeling of Stress : Not on file  Social Connections:   . Frequency of Communication with Friends and Family: Not on file  . Frequency of Social Gatherings with Friends and Family: Not on file  . Attends Religious Services: Not on file  . Active Member of Clubs or Organizations: Not on file  . Attends Archivist Meetings: Not on file  . Marital Status: Not on file  Intimate Partner Violence:   . Fear of Current or Ex-Partner: Not on file  . Emotionally Abused: Not on  file  . Physically Abused: Not on file  . Sexually Abused: Not on file      Review of Systems  All other systems reviewed and are negative.      Objective:   Physical Exam Vitals reviewed.  HENT:     Right Ear: There is impacted cerumen.     Left Ear: Hearing, tympanic membrane and ear canal normal.  Cardiovascular:     Rate and Rhythm: Normal rate and regular rhythm.  Pulmonary:     Effort: Pulmonary effort is normal.     Breath sounds: Normal breath sounds.           Assessment & Plan:  Impacted cerumen of right ear  Offered to remove the cerumen impaction with irrigation and lavage.  Patient is concerned that this might make her feel more dizzy.  She declines to have the cerumen impaction removed.  We discussed using a drop of olive oil or mineral removal every day to keep the wax supple to facilitate spontaneous drainage.  Patient will try this first.  She can return at any time and I will be glad to remove the cerumen impaction

## 2020-01-01 NOTE — Telephone Encounter (Signed)
[employer] Auth: [id number] (exp. 01/01/20 to 01/30/20) order sent to [facility]. They will reach out to the patient to schedule.

## 2020-01-09 ENCOUNTER — Other Ambulatory Visit: Payer: Self-pay | Admitting: Neurology

## 2020-01-12 ENCOUNTER — Encounter: Payer: Self-pay | Admitting: Family Medicine

## 2020-01-12 DIAGNOSIS — F411 Generalized anxiety disorder: Secondary | ICD-10-CM

## 2020-01-12 MED ORDER — CLONAZEPAM 1 MG PO TABS: ORAL_TABLET | ORAL | 1 refills | Status: DC

## 2020-01-12 NOTE — Telephone Encounter (Signed)
Ok to refill??  Last office visit 01/01/2020.  Last refill 12/08/2019, #1 refill.

## 2020-01-19 ENCOUNTER — Ambulatory Visit (INDEPENDENT_AMBULATORY_CARE_PROVIDER_SITE_OTHER): Payer: BC Managed Care – PPO | Admitting: Family Medicine

## 2020-01-19 ENCOUNTER — Encounter: Payer: Self-pay | Admitting: Family Medicine

## 2020-01-19 ENCOUNTER — Other Ambulatory Visit: Payer: Self-pay

## 2020-01-19 VITALS — BP 128/72 | HR 76 | Temp 97.8°F | Resp 16 | Ht 64.0 in | Wt 226.0 lb

## 2020-01-19 DIAGNOSIS — L719 Rosacea, unspecified: Secondary | ICD-10-CM

## 2020-01-19 DIAGNOSIS — N898 Other specified noninflammatory disorders of vagina: Secondary | ICD-10-CM | POA: Diagnosis not present

## 2020-01-19 MED ORDER — DOXYCYCLINE HYCLATE 100 MG PO TABS: 100.0000 mg | ORAL_TABLET | Freq: Two times a day (BID) | ORAL | 0 refills | Status: DC

## 2020-01-19 NOTE — Progress Notes (Signed)
Subjective:    Patient ID: Virginia [name], female    DOB: 09/20/1981, 39 y.o.   MRN: [id number]  HPI Patient presents today with several concerns.  First she has a rash on her cheeks and on her nose.  The rash is generalized erythema with telangiectasias.  There were no palpable papules.  There are no plaques.  It is not photosensitive but it is in the area exposed to increased levels of heat.  In the past I was concerned that she may have rosacea however she has never been diagnosed with rosacea.  The rash spares her chin.  It also spares her forehead.  There is no hair loss or alopecia.  Patient also reports spotting 1 week ago.  She states that her period was supposed to begin this week.  However she reports some brownish discharge from her vagina for 3 to 4 days last week, 1 "jellylike clot" and then no..  However of note, the patient has lost 25 pounds due to the keto diet over the last several months.  I believe that the rapid weight loss may have triggered dysfunctional uterine bleeding.  She denies any potential chance for pregnancy.  She states that she has not had intercourse with her husband in more than a year.  Past Medical History:  Diagnosis Date  . Anxiety   . Headache   . Insomnia   . Multiple sclerosis ([place])   . Nystagmus 02/15/2017  . Syncope and collapse    No past surgical history on file. Current Outpatient Medications on File Prior to Visit  Medication Sig Dispense Refill  . atenolol (TENORMIN) 50 MG tablet TAKE 1 TABLET BY MOUTH EVERY DAY 30 tablet 11  . baclofen (LIORESAL) 10 MG tablet TAKE 1 TABLET (10 MG TOTAL) 3 (THREE) TIMES DAILY BY MOUTH. 90 tablet 11  . clonazePAM (KLONOPIN) 1 MG tablet TAKE 1 TABLET BY MOUTH TWICE A DAY AS NEEDED FOR ANXIETY 20 tablet 1  . gabapentin (NEURONTIN) 600 MG tablet TAKE 1 TABLET (600 MG TOTAL) BY MOUTH 4 (FOUR) TIMES DAILY. 120 tablet 5  . meclizine (ANTIVERT) 25 MG tablet TAKE 1 TABLET (25 MG TOTAL) BY MOUTH 3 (THREE) TIMES DAILY  AS NEEDED FOR DIZZINESS. 30 tablet 0  . natalizumab (TYSABRI) 300 MG/15ML injection Inject 300 mg into the vein every 28 (twenty-eight) days.    . pantoprazole (PROTONIX) 40 MG tablet TAKE 1 TABLET BY MOUTH TWICE A DAY 180 tablet 3  . sertraline (ZOLOFT) 100 MG tablet Take 1 tablet (100 mg total) by mouth daily. 30 tablet 1  . sucralfate (CARAFATE) 1 g tablet TAKE 1 TABLET BY MOUTH 4 TIMES DAILY - WITH MEALS AND AT BEDTIME. 120 tablet 1   No current facility-administered medications on file prior to visit.   Allergies  Allergen Reactions  . Codeine Other (See Comments)    Hallucinations   . Onion    Social History   Socioeconomic History  . Marital status: Married    Spouse name: Not on file  . Number of children: Not on file  . Years of education: Not on file  . Highest education level: Not on file  Occupational History  . Not on file  Tobacco Use  . Smoking status: Current Every Day Smoker    Types: E-cigarettes  . Smokeless tobacco: Never Used  Substance and Sexual Activity  . Alcohol use: Yes    Alcohol/week: 0.0 standard drinks    Comment: rarely  . Drug use: No  .  Sexual activity: Not on file  Other Topics Concern  . Not on file  Social History Narrative  . Not on file   Social Determinants of Health   Financial Resource Strain:   . Difficulty of Paying Living Expenses:   Food Insecurity:   . Worried About Programme researcher, broadcasting/film/video in the Last Year:   . Barista in the Last Year:   Transportation Needs:   . Freight forwarder (Medical):   Marland Kitchen Lack of Transportation (Non-Medical):   Physical Activity:   . Days of Exercise per Week:   . Minutes of Exercise per Session:   Stress:   . Feeling of Stress :   Social Connections:   . Frequency of Communication with Friends and Family:   . Frequency of Social Gatherings with Friends and Family:   . Attends Religious Services:   . Active Member of Clubs or Organizations:   . Attends Banker  Meetings:   Marland Kitchen Marital Status:   Intimate Partner Violence:   . Fear of Current or Ex-Partner:   . Emotionally Abused:   Marland Kitchen Physically Abused:   . Sexually Abused:       Review of Systems  All other systems reviewed and are negative.      Objective:   Physical Exam Vitals reviewed.  HENT:     Head:      Left Ear: Hearing, tympanic membrane and ear canal normal.  Cardiovascular:     Rate and Rhythm: Normal rate and regular rhythm.  Pulmonary:     Effort: Pulmonary effort is normal.     Breath sounds: Normal breath sounds.  Skin:    Findings: Rash present. Rash is macular. Rash is not nodular, pustular, scaling or vesicular.           Assessment & Plan:  Rosacea  Vaginal discharge  I believe the rash on her face is rosacea.  We will start the patient on doxycycline 100 mg p.o. twice daily and then reassess in 3 to 4 weeks.  If better we will wean the patient down on rosacea to the lowest effective dose.  I recommended we do a pelvic exam and a wet prep to evaluate the cause of her spotting and vaginal discharge.  I suspect dysfunctional uterine bleeding due to rapid weight loss and irregular vaginal bleeding.  Patient is embarrassed and does not want to have a pelvic exam or a wet prep today.  She would like to wait 1 month and see if her periods resumed her normal pattern.  She denies any pelvic pain or foul-smelling vaginal discharge.  Neck step will be to perform a pelvic exam and wet prep and urine pregnancy test if periods do not improve/return.

## 2020-01-20 ENCOUNTER — Ambulatory Visit
Admission: RE | Admit: 2020-01-20 | Discharge: 2020-01-20 | Disposition: A | Discharge status: Home / Self Care | Payer: BC Managed Care – PPO | Source: Ambulatory Visit | Attending: Neurology | Admitting: Neurology

## 2020-01-20 DIAGNOSIS — H814 Vertigo of central origin: Secondary | ICD-10-CM

## 2020-01-20 DIAGNOSIS — G35 Multiple sclerosis: Secondary | ICD-10-CM | POA: Diagnosis not present

## 2020-01-27 DIAGNOSIS — G35 Multiple sclerosis: Secondary | ICD-10-CM | POA: Diagnosis not present

## 2020-01-27 NOTE — Progress Notes (Deleted)
BH MD/PA/NP OP Progress Note  01/27/2020 2:02 PM Analese Sovine  MRN:  [id number]  Chief Complaint:  HPI: *** Visit Diagnosis: No diagnosis found.  Past Psychiatric History: Please see initial evaluation for full details. I have reviewed the history. No updates at this time.     Past Medical History:  Past Medical History:  Diagnosis Date  . Anxiety   . Headache   . Insomnia   . Multiple sclerosis ([place])   . Nystagmus 02/15/2017  . Syncope and collapse    No past surgical history on file.  Family Psychiatric History: Please see initial evaluation for full details. I have reviewed the history. No updates at this time.     Family History:  Family History  Problem Relation Age of Onset  . Emphysema Mother   . Osteoarthritis Mother   . Anxiety disorder Mother   . Other Father   . Suicidality Father   . ALS Maternal Grandmother   . Colon cancer Neg Hx   . Colon polyps Neg Hx   . Esophageal cancer Neg Hx   . Rectal cancer Neg Hx   . Stomach cancer Neg Hx     Social History:  Social History   Socioeconomic History  . Marital status: Married    Spouse name: Not on file  . Number of children: Not on file  . Years of education: Not on file  . Highest education level: Not on file  Occupational History  . Not on file  Tobacco Use  . Smoking status: Current Every Day Smoker    Types: E-cigarettes  . Smokeless tobacco: Never Used  Substance and Sexual Activity  . Alcohol use: Yes    Alcohol/week: 0.0 standard drinks    Comment: rarely  . Drug use: No  . Sexual activity: Not on file  Other Topics Concern  . Not on file  Social History Narrative  . Not on file   Social Determinants of Health   Financial Resource Strain:   . Difficulty of Paying Living Expenses:   Food Insecurity:   . Worried About Charity fundraiser in the Last Year:   . Arboriculturist in the Last Year:   Transportation Needs:   . Film/video editor (Medical):   Marland Kitchen Lack of  Transportation (Non-Medical):   Physical Activity:   . Days of Exercise per Week:   . Minutes of Exercise per Session:   Stress:   . Feeling of Stress :   Social Connections:   . Frequency of Communication with Friends and Family:   . Frequency of Social Gatherings with Friends and Family:   . Attends Religious Services:   . Active Member of Clubs or Organizations:   . Attends Archivist Meetings:   Marland Kitchen Marital Status:     Allergies:  Allergies  Allergen Reactions  . Codeine Other (See Comments)    Hallucinations   . Onion     Metabolic Disorder Labs: No results found for: HGBA1C, MPG No results found for: PROLACTIN No results found for: CHOL, TRIG, HDL, CHOLHDL, VLDL, LDLCALC Lab Results  Component Value Date   TSH 1.010 01/01/2018   TSH 1.400 04/25/2017    Therapeutic Level Labs: No results found for: LITHIUM No results found for: VALPROATE No components found for:  CBMZ  Current Medications: Current Outpatient Medications  Medication Sig Dispense Refill  . atenolol (TENORMIN) 50 MG tablet TAKE 1 TABLET BY MOUTH EVERY DAY 30 tablet 11  .  baclofen (LIORESAL) 10 MG tablet TAKE 1 TABLET (10 MG TOTAL) 3 (THREE) TIMES DAILY BY MOUTH. 90 tablet 11  . clonazePAM (KLONOPIN) 1 MG tablet TAKE 1 TABLET BY MOUTH TWICE A DAY AS NEEDED FOR ANXIETY 20 tablet 1  . doxycycline (VIBRA-TABS) 100 MG tablet Take 1 tablet (100 mg total) by mouth 2 (two) times daily. 60 tablet 0  . gabapentin (NEURONTIN) 600 MG tablet TAKE 1 TABLET (600 MG TOTAL) BY MOUTH 4 (FOUR) TIMES DAILY. 120 tablet 5  . meclizine (ANTIVERT) 25 MG tablet TAKE 1 TABLET (25 MG TOTAL) BY MOUTH 3 (THREE) TIMES DAILY AS NEEDED FOR DIZZINESS. 30 tablet 0  . natalizumab (TYSABRI) 300 MG/15ML injection Inject 300 mg into the vein every 28 (twenty-eight) days.    . pantoprazole (PROTONIX) 40 MG tablet TAKE 1 TABLET BY MOUTH TWICE A DAY 180 tablet 3  . sertraline (ZOLOFT) 100 MG tablet Take 1 tablet (100 mg total) by  mouth daily. 30 tablet 1  . sucralfate (CARAFATE) 1 g tablet TAKE 1 TABLET BY MOUTH 4 TIMES DAILY - WITH MEALS AND AT BEDTIME. 120 tablet 1   No current facility-administered medications for this visit.     Musculoskeletal: Strength & Muscle Tone: N/A Gait & Station: N/A Patient leans: N/A  Psychiatric Specialty Exam: Review of Systems  There were no vitals taken for this visit.There is no height or weight on file to calculate BMI.  General Appearance: {Appearance:22683}  Eye Contact:  {B[facility] EYE CONTACT:22684}  Speech:  Clear and Coherent  Volume:  Normal  Mood:  {B[facility] MOOD:22306}  Affect:  {Affect (PAA):22687}  Thought Process:  Coherent  Orientation:  Full (Time, Place, and Person)  Thought Content: Logical   Suicidal Thoughts:  {ST/HT (PAA):22692}  Homicidal Thoughts:  {ST/HT (PAA):22692}  Memory:  Immediate;   Good  Judgement:  {Judgement (PAA):22694}  Insight:  {Insight (PAA):22695}  Psychomotor Activity:  Normal  Concentration:  Concentration: Good and Attention Span: Good  Recall:  Good  Fund of Knowledge: Good  Language: Good  Akathisia:  No  Handed:  Right  AIMS (if indicated): not done  Assets:  Communication Skills Desire for Improvement  ADL's:  Intact  Cognition: WNL  Sleep:  {[facility] GOOD/FAIR/POOR:22877}   Screenings: PHQ2-9     Office Visit from 07/15/2018 in [facility] Office Visit from 06/10/2018 in [facility] Office Visit from 01/28/2018 in [facility]  PHQ-2 Total Score  0  0  1  PHQ-9 Total Score  --  --  8       Assessment and Plan:  Lynia Roulhac is a 39 y.o. year old female with a history of anxiety, depression, multiple sclerosis, borderline sleep apnea (study in 2019) , who presents for follow up appointment for No diagnosis found.  # Panic disorder # MDD, single episode, mild   She continues to report panic attacks and depressive symptoms since the last visit.  Psychosocial stressors  includes medical condition of MS. Will uptitrate sertraline to target anxiety and depression.  She is on clonazepam for anxiety.  Discussed behavioral activation.   # Insomnia  She reports initial and middle insomnia. Discussed sleep hygiene. She takes clonazepam with good benefit. Will continue this medication.   Plan 1.Increase sertraline 100 mg daily  2. Next appointment:4/6 at 11:30 for 30 mins, video - She is on clonazepam 1 mg at nightand 0.5 mg daily prn for anxiety - TSH checked this year, normal according to the  patient - She is on gabapentin 600 mg QID, baclofen 10 mg TID,  Past trials of medication:lexapro, duloxetine (gained 40 lbs), Trintellix  The patient demonstrates the following risk factors for suicide: Chronic risk factors for suicide include:psychiatric disorder ofanxiety. Acute risk factorsfor suicide include: unemployment. Protective factorsfor this patient include: positive social support, coping skills and hope for the future. Considering these factors, the overall suicide risk at this point appears to below. Patientisappropriate for outpatient follow up.  Neysa Hotter, MD 01/27/2020, 2:02 PM

## 2020-01-28 NOTE — Progress Notes (Signed)
Virtual Visit via Video Note  I connected with Virginia [name] on 01/29/20 at 10:30 AM EDT by a video enabled telemedicine application and verified that I am speaking with the correct person using two identifiers.   I discussed the limitations of evaluation and management by telemedicine and the availability of in person appointments. The patient expressed understanding and agreed to proceed.    I discussed the assessment and treatment plan with the patient. The patient was provided an opportunity to ask questions and all were answered. The patient agreed with the plan and demonstrated an understanding of the instructions.   The patient was advised to call back or seek an in-person evaluation if the symptoms worsen or if the condition fails to improve as anticipated.  I provided 20 minutes of non-face-to-face time during this encounter.   Norman Clay, MD    [facility] MD/PA/NP OP Progress Note  01/29/2020 10:54 AM Virginia [name]  MRN:  [id number]  Chief Complaint:  Chief Complaint    Anxiety; Follow-up     HPI:  This is a follow-up appointment for panic disorder and depression.  She states that she continues to have panic attacks without any triggers.  It usually happens when she is watching TV at night.  It lasts for 10 minutes.  She tries to "breath through it." She tries not to take clonazepam during the day as it makes her feel drowsy. She enjoys being with her family (her husband, son, and her mother.)  She also feels excited that her nephew will come from [place] in the coming week.  She constantly feels fatigue, although she has been able to enjoy things. She takes a walk with her dog for 10 mins every day. She agrees to try 20 mins every day. She has initial insomnia; she went to bed 5:30 in the morning. She watches TV at night (provided psycho education). She has difficulty in concentration.  She has fair appetite.  She denies SI.  She feels anxious and tense.  She denies irritability.   Although she is unsure if she notices any change since uptitration of sertraline, she states that her mood has been better.     Visit Diagnosis:    ICD-10-CM   1. Panic disorder  F41.0 sertraline (ZOLOFT) 100 MG tablet  2. Major depressive disorder with single episode, in partial remission (HCC)  F32.4 sertraline (ZOLOFT) 100 MG tablet  3. Insomnia, unspecified type  G47.00     Past Psychiatric History: Please see initial evaluation for full details. I have reviewed the history. No updates at this time.     Past Medical History:  Past Medical History:  Diagnosis Date  . Anxiety   . Headache   . Insomnia   . Multiple sclerosis ([place])   . Nystagmus 02/15/2017  . Syncope and collapse    No past surgical history on file.  Family Psychiatric History: Please see initial evaluation for full details. I have reviewed the history. No updates at this time.     Family History:  Family History  Problem Relation Age of Onset  . Emphysema Mother   . Osteoarthritis Mother   . Anxiety disorder Mother   . Other Father   . Suicidality Father   . ALS Maternal Grandmother   . Colon cancer Neg Hx   . Colon polyps Neg Hx   . Esophageal cancer Neg Hx   . Rectal cancer Neg Hx   . Stomach cancer Neg Hx     Social  History:  Social History   Socioeconomic History  . Marital status: Married    Spouse name: Not on file  . Number of children: Not on file  . Years of education: Not on file  . Highest education level: Not on file  Occupational History  . Not on file  Tobacco Use  . Smoking status: Current Every Day Smoker    Types: E-cigarettes  . Smokeless tobacco: Never Used  Substance and Sexual Activity  . Alcohol use: Yes    Alcohol/week: 0.0 standard drinks    Comment: rarely  . Drug use: No  . Sexual activity: Not on file  Other Topics Concern  . Not on file  Social History Narrative  . Not on file   Social Determinants of Health   Financial Resource Strain:   .  Difficulty of Paying Living Expenses:   Food Insecurity:   . Worried About Programme researcher, broadcasting/film/video in the Last Year:   . Barista in the Last Year:   Transportation Needs:   . Freight forwarder (Medical):   Marland Kitchen Lack of Transportation (Non-Medical):   Physical Activity:   . Days of Exercise per Week:   . Minutes of Exercise per Session:   Stress:   . Feeling of Stress :   Social Connections:   . Frequency of Communication with Friends and Family:   . Frequency of Social Gatherings with Friends and Family:   . Attends Religious Services:   . Active Member of Clubs or Organizations:   . Attends Banker Meetings:   Marland Kitchen Marital Status:     Allergies:  Allergies  Allergen Reactions  . Codeine Other (See Comments)    Hallucinations   . Onion     Metabolic Disorder Labs: No results found for: HGBA1C, MPG No results found for: PROLACTIN No results found for: CHOL, TRIG, HDL, CHOLHDL, VLDL, LDLCALC Lab Results  Component Value Date   TSH 1.010 01/01/2018   TSH 1.400 04/25/2017    Therapeutic Level Labs: No results found for: LITHIUM No results found for: VALPROATE No components found for:  CBMZ  Current Medications: Current Outpatient Medications  Medication Sig Dispense Refill  . atenolol (TENORMIN) 50 MG tablet TAKE 1 TABLET BY MOUTH EVERY DAY 30 tablet 11  . baclofen (LIORESAL) 10 MG tablet TAKE 1 TABLET (10 MG TOTAL) 3 (THREE) TIMES DAILY BY MOUTH. 90 tablet 11  . clonazePAM (KLONOPIN) 1 MG tablet TAKE 1 TABLET BY MOUTH TWICE A DAY AS NEEDED FOR ANXIETY 20 tablet 1  . doxycycline (VIBRA-TABS) 100 MG tablet Take 1 tablet (100 mg total) by mouth 2 (two) times daily. 60 tablet 0  . gabapentin (NEURONTIN) 600 MG tablet TAKE 1 TABLET (600 MG TOTAL) BY MOUTH 4 (FOUR) TIMES DAILY. 120 tablet 5  . meclizine (ANTIVERT) 25 MG tablet TAKE 1 TABLET (25 MG TOTAL) BY MOUTH 3 (THREE) TIMES DAILY AS NEEDED FOR DIZZINESS. 30 tablet 0  . natalizumab (TYSABRI) 300  MG/15ML injection Inject 300 mg into the vein every 28 (twenty-eight) days.    . pantoprazole (PROTONIX) 40 MG tablet TAKE 1 TABLET BY MOUTH TWICE A DAY 180 tablet 3  . sertraline (ZOLOFT) 100 MG tablet Take 1.5 tablets (150 mg total) by mouth daily. 45 tablet 1  . sucralfate (CARAFATE) 1 g tablet TAKE 1 TABLET BY MOUTH 4 TIMES DAILY - WITH MEALS AND AT BEDTIME. 120 tablet 1  . traZODone (DESYREL) 50 MG tablet 25-50 mg at night  as needed for sleep 30 tablet 1   No current facility-administered medications for this visit.     Musculoskeletal: Strength & Muscle Tone: N/A Gait & Station: N/A Patient leans: N/A  Psychiatric Specialty Exam: Review of Systems  Psychiatric/Behavioral: Positive for decreased concentration and sleep disturbance. Negative for agitation, behavioral problems, confusion, dysphoric mood, hallucinations, self-injury and suicidal ideas. The patient is nervous/anxious. The patient is not hyperactive.   All other systems reviewed and are negative.   There were no vitals taken for this visit.There is no height or weight on file to calculate BMI.  General Appearance: Fairly Groomed  Eye Contact:  Good  Speech:  Clear and Coherent  Volume:  Normal  Mood:  Anxious  Affect:  Appropriate, Congruent and slighlty down, but reactive  Thought Process:  Coherent  Orientation:  Full (Time, Place, and Person)  Thought Content: Logical   Suicidal Thoughts:  No  Homicidal Thoughts:  No  Memory:  Immediate;   Good  Judgement:  Good  Insight:  Good  Psychomotor Activity:  Normal  Concentration:  Concentration: Good and Attention Span: Good  Recall:  Good  Fund of Knowledge: Good  Language: Good  Akathisia:  No  Handed:  Right  AIMS (if indicated): not done  Assets:  Communication Skills Desire for Improvement  ADL's:  Intact  Cognition: WNL  Sleep:  Poor   Screenings: PHQ2-9     Office Visit from 07/15/2018 in [facility] Office Visit from 06/10/2018  in [facility] Office Visit from 01/28/2018 in [facility]  PHQ-2 Total Score  0  0  1  PHQ-9 Total Score  --  --  8       Assessment and Plan:  Virginia [name] is a 39 y.o. year old female with a history of panic disorder, depression, multiple sclerosis, borderline sleep apnea (study in 2019) , who presents for follow up appointment for Panic disorder - Plan: sertraline (ZOLOFT) 100 MG tablet  Major depressive disorder with single episode, in partial remission (HCC) - Plan: sertraline (ZOLOFT) 100 MG tablet  Insomnia, unspecified type  # Panic disorder # MDD, single episode, in partial remission She continues to have a panic attacks, although there has been improvement in depressive symptoms since up titration of sertraline.  Psychosocial stressors includes medical condition of MS. Will do further up titration of sertraline to target anxiety and depression.  Discussed behavioral activation.   # Insomnia She reports initial insomnia.  Discussed sleep hygiene.  We start trazodone as needed for insomnia.  Discussed potential risk of drowsiness.   Plan 1.Increase sertraline 150 mg daily  2. Start Trazodone 25-50 mg at night as needed for sleep 3.Next appointment:5/13 at 1:20 for 20 mins, video - front desk to contact for therapy appointment -  On melatonin 10 mg at night  - She is onclonazepam 1.5 mg at night as needed for sleep , prescribed by PCP - TSH checked this year, normal according to the patient - She is on gabapentin 600 mg QID, baclofen 10 mg TID,  Past trials of medication:lexapro, venlafaxine,  duloxetine (gained 40 lbs), Trintellix  The patient demonstrates the following risk factors for suicide: Chronic risk factors for suicide include:psychiatric disorder ofanxiety. Acute risk factorsfor suicide include: unemployment. Protective factorsfor this patient include: positive social support, coping skills and hope for the future.  Considering these factors, the overall suicide risk at this point appears to below. Patientisappropriate for outpatient follow up.  Neysa Hotter, MD 01/29/2020, 10:54 AM

## 2020-01-29 ENCOUNTER — Other Ambulatory Visit: Payer: Self-pay

## 2020-01-29 ENCOUNTER — Ambulatory Visit (INDEPENDENT_AMBULATORY_CARE_PROVIDER_SITE_OTHER): Payer: BC Managed Care – PPO | Admitting: Psychiatry

## 2020-01-29 ENCOUNTER — Encounter (HOSPITAL_COMMUNITY): Payer: Self-pay | Admitting: Psychiatry

## 2020-01-29 DIAGNOSIS — F410 Panic disorder [episodic paroxysmal anxiety] without agoraphobia: Secondary | ICD-10-CM | POA: Diagnosis not present

## 2020-01-29 DIAGNOSIS — F324 Major depressive disorder, single episode, in partial remission: Secondary | ICD-10-CM | POA: Diagnosis not present

## 2020-01-29 DIAGNOSIS — G4700 Insomnia, unspecified: Secondary | ICD-10-CM | POA: Diagnosis not present

## 2020-01-29 MED ORDER — TRAZODONE HCL 50 MG PO TABS: ORAL_TABLET | ORAL | 1 refills | Status: DC

## 2020-01-29 MED ORDER — SERTRALINE HCL 100 MG PO TABS: 150.0000 mg | ORAL_TABLET | Freq: Every day | ORAL | 1 refills | Status: DC

## 2020-01-29 NOTE — Patient Instructions (Signed)
1.Increase sertraline 150 mg daily  2. Start Trazodone 25-50 mg at night as needed for sleep 3.Next appointment:5/13 at 1:20

## 2020-02-02 ENCOUNTER — Ambulatory Visit (HOSPITAL_COMMUNITY): Payer: BC Managed Care – PPO | Admitting: Psychiatry

## 2020-02-03 ENCOUNTER — Ambulatory Visit (HOSPITAL_COMMUNITY): Payer: BC Managed Care – PPO | Admitting: Psychiatry

## 2020-02-07 ENCOUNTER — Other Ambulatory Visit: Payer: Self-pay | Admitting: Family Medicine

## 2020-02-07 DIAGNOSIS — F411 Generalized anxiety disorder: Secondary | ICD-10-CM

## 2020-02-09 NOTE — Telephone Encounter (Signed)
Requesting refill    Klonopin  LOV: 01/19/2020  LRF:  01/12/2020

## 2020-02-12 ENCOUNTER — Other Ambulatory Visit: Payer: Self-pay | Admitting: Family Medicine

## 2020-02-17 ENCOUNTER — Encounter: Payer: Self-pay | Admitting: Family Medicine

## 2020-02-19 ENCOUNTER — Other Ambulatory Visit: Payer: Self-pay | Admitting: Family Medicine

## 2020-02-19 MED ORDER — METRONIDAZOLE 0.75 % EX CREA: TOPICAL_CREAM | Freq: Two times a day (BID) | CUTANEOUS | 0 refills | Status: DC

## 2020-02-24 DIAGNOSIS — G35 Multiple sclerosis: Secondary | ICD-10-CM | POA: Diagnosis not present

## 2020-03-04 ENCOUNTER — Other Ambulatory Visit: Payer: Self-pay | Admitting: Family Medicine

## 2020-03-04 DIAGNOSIS — F411 Generalized anxiety disorder: Secondary | ICD-10-CM

## 2020-03-04 NOTE — Progress Notes (Signed)
Virtual Visit via Video Note  I connected with Virginia [name] on 03/11/20 at  1:20 PM EDT by a video enabled telemedicine application and verified that I am speaking with the correct person using two identifiers.   I discussed the limitations of evaluation and management by telemedicine and the availability of in person appointments. The patient expressed understanding and agreed to proceed.     I discussed the assessment and treatment plan with the patient. The patient was provided an opportunity to ask questions and all were answered. The patient agreed with the plan and demonstrated an understanding of the instructions.   The patient was advised to call back or seek an in-person evaluation if the symptoms worsen or if the condition fails to improve as anticipated.  I provided 15 minutes of non-face-to-face time during this encounter.   Norman Clay, MD    [facility] MD/PA/NP OP Progress Note  03/11/2020 2:01 PM [name]  MRN:  [id number]  Chief Complaint:  Chief Complaint    Depression; Follow-up; Anxiety     HPI:  This is a follow-up appointment for depression and anxiety, insomnia.  She states that she is not doing well.  She feels fatigued and irritable.  She also has leg cramp, facial redness and weakness in her extremity for a couple of weeks. She is planning to reach out to her provider for MS. She wonders if she can go back on duloxetine as she believes she was doing better on that medication. She agrees to stay on sertraline at this time given her adverse reaction of significant weight gain.  She had a good time on Mother's Day.  She visited her mother, and her son joined together. She reports great relationship with her mother. [name] visits her at least once a week. She has initial and middle insomnia. She goes to bed at 1 AM. She watches TV before going to bed. She sleeps at 3-5 in AM, and wakes up at 8-10 AM. She feels down, and has mild anhedonia. She reports good  concentration.  She has good appetite.  She denies SI.  She feels anxious sometimes.  She has occasional panic attacks.  She has not noticed much difference since up titration of serotonin.    Visit Diagnosis:    ICD-10-CM   1. Current moderate episode of major depressive disorder without prior episode (HCC)  F32.1 sertraline (ZOLOFT) 100 MG tablet  2. Panic disorder  F41.0 sertraline (ZOLOFT) 100 MG tablet  3. Insomnia, unspecified type  G47.00     Past Psychiatric History: Please see initial evaluation for full details. I have reviewed the history. No updates at this time.     Past Medical History:  Past Medical History:  Diagnosis Date  . Anxiety   . Headache   . Insomnia   . Multiple sclerosis ([place])   . Nystagmus 02/15/2017  . Syncope and collapse    No past surgical history on file.  Family Psychiatric History: Please see initial evaluation for full details. I have reviewed the history. No updates at this time.     Family History:  Family History  Problem Relation Age of Onset  . Emphysema Mother   . Osteoarthritis Mother   . Anxiety disorder Mother   . Other Father   . Suicidality Father   . ALS Maternal Grandmother   . Colon cancer Neg Hx   . Colon polyps Neg Hx   . Esophageal cancer Neg Hx   . Rectal cancer Neg Hx   .  Stomach cancer Neg Hx     Social History:  Social History   Socioeconomic History  . Marital status: Married    Spouse name: Not on file  . Number of children: Not on file  . Years of education: Not on file  . Highest education level: Not on file  Occupational History  . Not on file  Tobacco Use  . Smoking status: Current Every Day Smoker    Types: E-cigarettes  . Smokeless tobacco: Never Used  Substance and Sexual Activity  . Alcohol use: Yes    Alcohol/week: 0.0 standard drinks    Comment: rarely  . Drug use: No  . Sexual activity: Not on file  Other Topics Concern  . Not on file  Social History Narrative  . Not on file    Social Determinants of Health   Financial Resource Strain:   . Difficulty of Paying Living Expenses:   Food Insecurity:   . Worried About Programme researcher, broadcasting/film/video in the Last Year:   . Barista in the Last Year:   Transportation Needs:   . Freight forwarder (Medical):   Marland Kitchen Lack of Transportation (Non-Medical):   Physical Activity:   . Days of Exercise per Week:   . Minutes of Exercise per Session:   Stress:   . Feeling of Stress :   Social Connections:   . Frequency of Communication with Friends and Family:   . Frequency of Social Gatherings with Friends and Family:   . Attends Religious Services:   . Active Member of Clubs or Organizations:   . Attends Banker Meetings:   Marland Kitchen Marital Status:     Allergies:  Allergies  Allergen Reactions  . Codeine Other (See Comments)    Hallucinations   . Onion     Metabolic Disorder Labs: No results found for: HGBA1C, MPG No results found for: PROLACTIN No results found for: CHOL, TRIG, HDL, CHOLHDL, VLDL, LDLCALC Lab Results  Component Value Date   TSH 1.010 01/01/2018   TSH 1.400 04/25/2017    Therapeutic Level Labs: No results found for: LITHIUM No results found for: VALPROATE No components found for:  CBMZ  Current Medications: Current Outpatient Medications  Medication Sig Dispense Refill  . clonazePAM (KLONOPIN) 1 MG tablet TAKE 1 TABLET BY MOUTH TWICE A DAY AS NEEDED FOR ANXIETY 20 tablet 1  . atenolol (TENORMIN) 50 MG tablet TAKE 1 TABLET BY MOUTH EVERY DAY 30 tablet 11  . baclofen (LIORESAL) 10 MG tablet TAKE 1 TABLET (10 MG TOTAL) 3 (THREE) TIMES DAILY BY MOUTH. 90 tablet 11  . doxepin (SINEQUAN) 25 MG capsule Take 1 capsule (25 mg total) by mouth at bedtime as needed. 30 capsule 1  . gabapentin (NEURONTIN) 600 MG tablet TAKE 1 TABLET (600 MG TOTAL) BY MOUTH 4 (FOUR) TIMES DAILY. 120 tablet 5  . meclizine (ANTIVERT) 25 MG tablet TAKE 1 TABLET (25 MG TOTAL) BY MOUTH 3 (THREE) TIMES DAILY AS  NEEDED FOR DIZZINESS. 30 tablet 0  . metroNIDAZOLE (METROCREAM) 0.75 % cream Apply topically 2 (two) times daily. 45 g 0  . natalizumab (TYSABRI) 300 MG/15ML injection Inject 300 mg into the vein every 28 (twenty-eight) days.    . pantoprazole (PROTONIX) 40 MG tablet TAKE 1 TABLET BY MOUTH TWICE A DAY 180 tablet 3  . [START ON 03/30/2020] sertraline (ZOLOFT) 100 MG tablet Take 1.5 tablets (150 mg total) by mouth daily. 45 tablet 0  . sucralfate (CARAFATE) 1 g tablet  TAKE 1 TABLET BY MOUTH 4 TIMES DAILY - WITH MEALS AND AT BEDTIME. 120 tablet 1   No current facility-administered medications for this visit.     Musculoskeletal: Strength & Muscle Tone: N/A Gait & Station: N/A Patient leans: N/A  Psychiatric Specialty Exam: Review of Systems  Psychiatric/Behavioral: Positive for dysphoric mood and sleep disturbance. Negative for agitation, behavioral problems, confusion, decreased concentration, hallucinations, self-injury and suicidal ideas. The patient is nervous/anxious. The patient is not hyperactive.   All other systems reviewed and are negative.   There were no vitals taken for this visit.There is no height or weight on file to calculate BMI.  General Appearance: Fairly Groomed  Eye Contact:  Good  Speech:  Clear and Coherent  Volume:  Normal  Mood:  Anxious and Depressed  Affect:  Appropriate, Congruent and down  Thought Process:  Coherent  Orientation:  Full (Time, Place, and Person)  Thought Content: Logical   Suicidal Thoughts:  No  Homicidal Thoughts:  No  Memory:  Immediate;   Good  Judgement:  Good  Insight:  Good  Psychomotor Activity:  Normal  Concentration:  Concentration: Good and Attention Span: Good  Recall:  Good  Fund of Knowledge: Good  Language: Good  Akathisia:  No  Handed:  Right  AIMS (if indicated): not done  Assets:  Communication Skills Desire for Improvement  ADL's:  Intact  Cognition: WNL  Sleep:  Poor   Screenings: PHQ2-9     Office Visit  from 07/15/2018 in [facility] Office Visit from 06/10/2018 in [facility] Office Visit from 01/28/2018 in [facility]  PHQ-2 Total Score  0  0  1  PHQ-9 Total Score  --  --  8       Assessment and Plan:  Skyra Herling is a 39 y.o. year old female with a history of panic disorder, depression, multiple sclerosis,borderline sleep apnea (study in 2019), who presents for follow up appointment for Current moderate episode of major depressive disorder without prior episode (HCC) - Plan: sertraline (ZOLOFT) 100 MG tablet  Panic disorder - Plan: sertraline (ZOLOFT) 100 MG tablet  Insomnia, unspecified type  # Panic disorder # MDD, single episode, ,moderate She reports worsening in depression and anxiety since her last visit. It is likely multifactorial given worsening in physical symptoms, which she attributes to her MS. After having discussion of treatment option, will continue current dose of sertraline to see if her mood improves as her physical symptoms resolve. Coached behavioral activation.    # Insomnia She has initial and middle insomnia.  She has limited benefit from trazodone.  Will start doxepin as needed for insomnia.  Discussed potential risk of palpitation, dry mouth, and strengthening syndrome.  Coached sleep hygiene.   Plan 1.Continue sertraline 150 mg daily  2. Discontinue Trazodone  3. Start doxepin 25 mg at night as needed for insomnia 4. Next appointment:6/24 at 2:30 for 20 mins, video -  onclonazepam 0.5-1 mg at night as needed for sleep , prescribed by PCP -  On melatonin 10 mg at night  - TSH checked this year, normal according to the patient - She is on gabapentin 600 mg QID, baclofen 10 mg TID,  Past trials of medication:lexapro, venlafaxine,  duloxetine (gained 40 lbs), Trintellix  The patient demonstrates the following risk factors for suicide: Chronic risk factors for suicide include:psychiatric disorder  ofanxiety. Acute risk factorsfor suicide include: unemployment. Protective factorsfor this patient include: positive social support, coping  skills and hope for the future. Considering these factors, the overall suicide risk at this point appears to below. Patientisappropriate for outpatient follow up.    Neysa Hotter, MD 03/11/2020, 2:01 PM

## 2020-03-05 NOTE — Telephone Encounter (Signed)
Requesting refill  Klonopin  LOV:  01/19/2020  LRF:  02/09/2020

## 2020-03-11 ENCOUNTER — Encounter (HOSPITAL_COMMUNITY): Payer: Self-pay | Admitting: Psychiatry

## 2020-03-11 ENCOUNTER — Telehealth (INDEPENDENT_AMBULATORY_CARE_PROVIDER_SITE_OTHER): Payer: BC Managed Care – PPO | Admitting: Psychiatry

## 2020-03-11 ENCOUNTER — Other Ambulatory Visit: Payer: Self-pay

## 2020-03-11 DIAGNOSIS — F321 Major depressive disorder, single episode, moderate: Secondary | ICD-10-CM

## 2020-03-11 DIAGNOSIS — G4700 Insomnia, unspecified: Secondary | ICD-10-CM | POA: Diagnosis not present

## 2020-03-11 DIAGNOSIS — F410 Panic disorder [episodic paroxysmal anxiety] without agoraphobia: Secondary | ICD-10-CM | POA: Diagnosis not present

## 2020-03-11 MED ORDER — SERTRALINE HCL 100 MG PO TABS: 150.0000 mg | ORAL_TABLET | Freq: Every day | ORAL | 0 refills | Status: DC

## 2020-03-11 MED ORDER — DOXEPIN HCL 25 MG PO CAPS: 25.0000 mg | ORAL_CAPSULE | Freq: Every evening | ORAL | 1 refills | Status: DC | PRN

## 2020-03-11 NOTE — Patient Instructions (Signed)
1.Continue sertraline 150 mg daily  2. Discontinue Trazodone  3. Start doxepin 25 mg at night as needed for insomnia 4. Next appointment:6/24 at 2:30

## 2020-03-14 ENCOUNTER — Other Ambulatory Visit: Payer: Self-pay | Admitting: Family Medicine

## 2020-03-20 ENCOUNTER — Other Ambulatory Visit: Payer: Self-pay | Admitting: Family Medicine

## 2020-03-31 ENCOUNTER — Other Ambulatory Visit: Payer: Self-pay | Admitting: Family Medicine

## 2020-03-31 DIAGNOSIS — G35 Multiple sclerosis: Secondary | ICD-10-CM | POA: Diagnosis not present

## 2020-04-05 ENCOUNTER — Other Ambulatory Visit: Payer: Self-pay | Admitting: Family Medicine

## 2020-04-05 DIAGNOSIS — F411 Generalized anxiety disorder: Secondary | ICD-10-CM

## 2020-04-08 ENCOUNTER — Other Ambulatory Visit: Payer: Self-pay | Admitting: Family Medicine

## 2020-04-08 DIAGNOSIS — F411 Generalized anxiety disorder: Secondary | ICD-10-CM

## 2020-04-08 NOTE — Telephone Encounter (Signed)
Last refilled: 03/05/2020 Last office visit: 01/19/2020

## 2020-04-16 ENCOUNTER — Telehealth (HOSPITAL_COMMUNITY): Payer: Self-pay | Admitting: Psychiatry

## 2020-04-16 ENCOUNTER — Other Ambulatory Visit: Payer: Self-pay | Admitting: Family Medicine

## 2020-04-16 NOTE — Telephone Encounter (Signed)
Spoke with patient and informed her with what provider stated and she stated she is no longer taking the Doxepin. Per pt she is only taking the Amitriptyline PRN.

## 2020-04-16 NOTE — Telephone Encounter (Signed)
Received a notification from her insurance. It seems like she is on Amitriptyline 50 mg daily, prescribed by Dr. Tanya Nones. Could you verify with the patient if she takes that medication? If that is the case, please advise her to discontinue Doxepin (the medication I started at her last visit) to avoid polypharmacy given both of these medication are quite similar.

## 2020-04-19 NOTE — Progress Notes (Signed)
Virtual Visit via Video Note  I connected with Virginia [name] on 04/22/20 at  2:30 PM EDT by a video enabled telemedicine application and verified that I am speaking with the correct person using two identifiers.   I discussed the limitations of evaluation and management by telemedicine and the availability of in person appointments. The patient expressed understanding and agreed to proceed.     I discussed the assessment and treatment plan with the patient. The patient was provided an opportunity to ask questions and all were answered. The patient agreed with the plan and demonstrated an understanding of the instructions.   The patient was advised to call back or seek an in-person evaluation if the symptoms worsen or if the condition fails to improve as anticipated.  Location: patient- home, provider- office   I provided 12 minutes of non-face-to-face time during this encounter.   Neysa Hotter, MD   [facility] MD/PA/NP OP Progress Note  04/22/2020 2:50 PM Virginia [name]  MRN:  [id number]  Chief Complaint:  Chief Complaint    Follow-up; Depression     HPI:  This is a follow-up appointment for depression.  She states that she has been feeling fatigue.  She believes her mood has been good otherwise.  She enjoyed a visit from nephew a few weeks ago.  She tries to have time more with her husband, who is usually busy at work.  She reports good relationship with her mother.  She has initial and middle insomnia.  She denies feeling depressed.  She has fair concentration.  She denies anhedonia.  She denies SI.  She feels anxious and tense almost all the time.  She has panic attacks once or twice a week, without significant triggers, although it has become less compared to before.    Visit Diagnosis:    ICD-10-CM   1. Panic disorder  F41.0 sertraline (ZOLOFT) 100 MG tablet  2. Major depressive disorder with single episode, in partial remission (HCC)  F32.4 sertraline (ZOLOFT) 100 MG tablet  3.  Insomnia, unspecified type  G47.00 Ambulatory referral to Neurology    Past Psychiatric History: Please see initial evaluation for full details. I have reviewed the history. No updates at this time.     Past Medical History:  Past Medical History:  Diagnosis Date  . Anxiety   . Headache   . Insomnia   . Multiple sclerosis (HCC)   . Nystagmus 02/15/2017  . Syncope and collapse    No past surgical history on file.  Family Psychiatric History: Please see initial evaluation for full details. I have reviewed the history. No updates at this time.     Family History:  Family History  Problem Relation Age of Onset  . Emphysema Mother   . Osteoarthritis Mother   . Anxiety disorder Mother   . Other Father   . Suicidality Father   . ALS Maternal Grandmother   . Colon cancer Neg Hx   . Colon polyps Neg Hx   . Esophageal cancer Neg Hx   . Rectal cancer Neg Hx   . Stomach cancer Neg Hx     Social History:  Social History   Socioeconomic History  . Marital status: Married    Spouse name: Not on file  . Number of children: Not on file  . Years of education: Not on file  . Highest education level: Not on file  Occupational History  . Not on file  Tobacco Use  . Smoking status: Current Every Day Smoker  Types: E-cigarettes  . Smokeless tobacco: Never Used  Substance and Sexual Activity  . Alcohol use: Yes    Alcohol/week: 0.0 standard drinks    Comment: rarely  . Drug use: No  . Sexual activity: Not on file  Other Topics Concern  . Not on file  Social History Narrative  . Not on file   Social Determinants of Health   Financial Resource Strain:   . Difficulty of Paying Living Expenses:   Food Insecurity:   . Worried About Programme researcher, broadcasting/film/video in the Last Year:   . Barista in the Last Year:   Transportation Needs:   . Freight forwarder (Medical):   Marland Kitchen Lack of Transportation (Non-Medical):   Physical Activity:   . Days of Exercise per Week:   .  Minutes of Exercise per Session:   Stress:   . Feeling of Stress :   Social Connections:   . Frequency of Communication with Friends and Family:   . Frequency of Social Gatherings with Friends and Family:   . Attends Religious Services:   . Active Member of Clubs or Organizations:   . Attends Banker Meetings:   Marland Kitchen Marital Status:     Allergies:  Allergies  Allergen Reactions  . Codeine Other (See Comments)    Hallucinations   . Onion     Metabolic Disorder Labs: No results found for: HGBA1C, MPG No results found for: PROLACTIN No results found for: CHOL, TRIG, HDL, CHOLHDL, VLDL, LDLCALC Lab Results  Component Value Date   TSH 1.010 01/01/2018   TSH 1.400 04/25/2017    Therapeutic Level Labs: No results found for: LITHIUM No results found for: VALPROATE No components found for:  CBMZ  Current Medications: Current Outpatient Medications  Medication Sig Dispense Refill  . amitriptyline (ELAVIL) 50 MG tablet TAKE 1 TABLET BY MOUTH EVERY DAY AT BEDTIME AS NEEDED FOR SLEEP 30 tablet 8  . atenolol (TENORMIN) 50 MG tablet TAKE 1 TABLET BY MOUTH EVERY DAY 30 tablet 11  . baclofen (LIORESAL) 10 MG tablet TAKE 1 TABLET (10 MG TOTAL) 3 (THREE) TIMES DAILY BY MOUTH. 90 tablet 11  . clonazePAM (KLONOPIN) 1 MG tablet TAKE 1 TABLET BY MOUTH TWICE A DAY AS NEEDED FOR ANXIETY 20 tablet 1  . gabapentin (NEURONTIN) 600 MG tablet TAKE 1 TABLET (600 MG TOTAL) BY MOUTH 4 (FOUR) TIMES DAILY. 120 tablet 5  . meclizine (ANTIVERT) 25 MG tablet TAKE 1 TABLET (25 MG TOTAL) BY MOUTH 3 (THREE) TIMES DAILY AS NEEDED FOR DIZZINESS. 30 tablet 0  . metroNIDAZOLE (METROCREAM) 0.75 % cream Apply topically 2 (two) times daily. 45 g 0  . natalizumab (TYSABRI) 300 MG/15ML injection Inject 300 mg into the vein every 28 (twenty-eight) days.    . pantoprazole (PROTONIX) 40 MG tablet TAKE 1 TABLET BY MOUTH TWICE A DAY 60 tablet 5  . sertraline (ZOLOFT) 100 MG tablet Take 1.5 tablets (150 mg total)  by mouth daily. 135 tablet 0  . sucralfate (CARAFATE) 1 g tablet TAKE 1 TABLET BY MOUTH 4 TIMES DAILY - WITH MEALS AND AT BEDTIME. 120 tablet 1   No current facility-administered medications for this visit.     Musculoskeletal: Strength & Muscle Tone: N/A Gait & Station: N/A Patient leans: N/A  Psychiatric Specialty Exam: Review of Systems  Psychiatric/Behavioral: Positive for sleep disturbance. Negative for agitation, behavioral problems, confusion, decreased concentration, dysphoric mood, hallucinations, self-injury and suicidal ideas. The patient is nervous/anxious. The patient is  not hyperactive.   All other systems reviewed and are negative.   There were no vitals taken for this visit.There is no height or weight on file to calculate BMI.  General Appearance: Fairly Groomed  Eye Contact:  Good  Speech:  Clear and Coherent  Volume:  Normal  Mood:  tired  Affect:  Appropriate, Congruent and fatigue  Thought Process:  Coherent  Orientation:  Full (Time, Place, and Person)  Thought Content: Logical   Suicidal Thoughts:  No  Homicidal Thoughts:  No  Memory:  Immediate;   Good  Judgement:  Good  Insight:  Fair  Psychomotor Activity:  Normal  Concentration:  Concentration: Good and Attention Span: Good  Recall:  Good  Fund of Knowledge: Good  Language: Good  Akathisia:  No  Handed:  Right  AIMS (if indicated): not done  Assets:  Communication Skills Desire for Improvement  ADL's:  Intact  Cognition: WNL  Sleep:  Poor   Screenings: PHQ2-9     Office Visit from 07/15/2018 in [place] Office Visit from 06/10/2018 in [place] Office Visit from 01/28/2018 in [place]  PHQ-2 Total Score 0 0 1  PHQ-9 Total Score -- -- 8       Assessment and Plan:  Virginia [name] is a 39 y.o. year old female with a history of panic disorder, depression, multiple sclerosis,borderline sleep apnea (study in 2019), who presents for  follow up appointment for below.   1. Panic disorder 2. Major depressive disorder with single episode, in partial remission ([place]) She reports overall improvement in panic attacks and depressive symptoms since the last visit.  Although she may benefit from further up titration of sertraline, she reports her preference to stay on the current dose.  We will continue the current dose to target anxiety and depression. She will greatly benefit from CBT; will make a referral.  3. Insomnia, unspecified type She has initial, middle insomnia, and has significant daytime fatigue.  Although it can be attributable to the MS, will make referral to rule out sleep apnea.    Plan 1.Continue sertraline 150 mg daily 2. Discontinue doxepin  3. Referral for therapy  3. Next appointment:9/16 at 1 PM for 20 mins, video 4. Referral for evaluation of sleep apnea -  onclonazepam 0.5-1 mg at night as needed for sleep , prescribed by PCP - On melatonin 10 mg at night  - TSH checked this year, normal according to the patient - She is on gabapentin 600 mg QID, baclofen 10 mg TID,  Past trials of medication:lexapro,venlafaxine,duloxetine (gained 40 lbs), Trintellix, Trazodone, doxepin  The patient demonstrates the following risk factors for suicide: Chronic risk factors for suicide include:psychiatric disorder ofanxiety. Acute risk factorsfor suicide include: unemployment. Protective factorsfor this patient include: positive social support, coping skills and hope for the future. Considering these factors, the overall suicide risk at this point appears to below. Patientisappropriate for outpatient follow up.   Norman Clay, MD 04/22/2020, 2:50 PM

## 2020-04-22 ENCOUNTER — Other Ambulatory Visit: Payer: Self-pay

## 2020-04-22 ENCOUNTER — Telehealth (INDEPENDENT_AMBULATORY_CARE_PROVIDER_SITE_OTHER): Payer: BC Managed Care – PPO | Admitting: Psychiatry

## 2020-04-22 ENCOUNTER — Encounter (HOSPITAL_COMMUNITY): Payer: Self-pay | Admitting: Psychiatry

## 2020-04-22 DIAGNOSIS — F324 Major depressive disorder, single episode, in partial remission: Secondary | ICD-10-CM | POA: Diagnosis not present

## 2020-04-22 DIAGNOSIS — G4700 Insomnia, unspecified: Secondary | ICD-10-CM | POA: Diagnosis not present

## 2020-04-22 DIAGNOSIS — F410 Panic disorder [episodic paroxysmal anxiety] without agoraphobia: Secondary | ICD-10-CM | POA: Diagnosis not present

## 2020-04-22 MED ORDER — SERTRALINE HCL 100 MG PO TABS: 150.0000 mg | ORAL_TABLET | Freq: Every day | ORAL | 0 refills | Status: DC

## 2020-04-28 DIAGNOSIS — G35 Multiple sclerosis: Secondary | ICD-10-CM | POA: Diagnosis not present

## 2020-05-14 ENCOUNTER — Other Ambulatory Visit: Payer: Self-pay | Admitting: Family Medicine

## 2020-05-14 DIAGNOSIS — F411 Generalized anxiety disorder: Secondary | ICD-10-CM

## 2020-05-14 NOTE — Telephone Encounter (Signed)
Ok to refill??  Last office visit 02/19/2020.  Last refill 04/09/2020.

## 2020-05-26 DIAGNOSIS — G35 Multiple sclerosis: Secondary | ICD-10-CM | POA: Diagnosis not present

## 2020-06-12 ENCOUNTER — Other Ambulatory Visit: Payer: Self-pay | Admitting: Family Medicine

## 2020-06-12 DIAGNOSIS — F411 Generalized anxiety disorder: Secondary | ICD-10-CM

## 2020-06-14 ENCOUNTER — Telehealth: Payer: Self-pay | Admitting: *Deleted

## 2020-06-14 NOTE — Telephone Encounter (Signed)
Received fax from touch prescribing program that pt is re-authorized from 06/14/20-01/11/21. Pt enrollment number: [id number]. Account: [employer]. Site auth number: [id number]

## 2020-06-14 NOTE — Telephone Encounter (Signed)
Faxed completed/signed [facility] pt status report and reauth questionnaire to [facility] at [phone]. Received confirmation.  

## 2020-06-16 NOTE — Telephone Encounter (Signed)
Requested Prescriptions   Pending Prescriptions Disp Refills   clonazePAM (KLONOPIN) 1 MG tablet [Pharmacy Med Name: CLONAZEPAM 1 MG TABLET] 20 tablet 1    Sig: TAKE 1 TABLET BY MOUTH TWICE A DAY AS NEEDED FOR ANXIETY     Last OV 01/19/2020   Last written 05/17/2020

## 2020-06-24 ENCOUNTER — Telehealth: Payer: Self-pay | Admitting: *Deleted

## 2020-06-24 ENCOUNTER — Encounter: Payer: Self-pay | Admitting: Neurology

## 2020-06-24 ENCOUNTER — Ambulatory Visit (INDEPENDENT_AMBULATORY_CARE_PROVIDER_SITE_OTHER): Payer: BC Managed Care – PPO | Admitting: Neurology

## 2020-06-24 VITALS — BP 109/71 | HR 78 | Ht 64.0 in | Wt 196.5 lb

## 2020-06-24 DIAGNOSIS — Z79899 Other long term (current) drug therapy: Secondary | ICD-10-CM

## 2020-06-24 DIAGNOSIS — G35 Multiple sclerosis: Secondary | ICD-10-CM | POA: Diagnosis not present

## 2020-06-24 DIAGNOSIS — R5383 Other fatigue: Secondary | ICD-10-CM

## 2020-06-24 DIAGNOSIS — H814 Vertigo of central origin: Secondary | ICD-10-CM | POA: Diagnosis not present

## 2020-06-24 DIAGNOSIS — R200 Anesthesia of skin: Secondary | ICD-10-CM | POA: Insufficient documentation

## 2020-06-24 MED ORDER — GABAPENTIN 600 MG PO TABS: 600.0000 mg | ORAL_TABLET | Freq: Four times a day (QID) | ORAL | 3 refills | Status: DC

## 2020-06-24 MED ORDER — BACLOFEN 10 MG PO TABS: ORAL_TABLET | ORAL | 11 refills | Status: DC

## 2020-06-24 NOTE — Telephone Encounter (Signed)
Placed JCV lab in [facility] lock box for routine lab pick up. Results pending. 

## 2020-06-24 NOTE — Progress Notes (Signed)
[facility]  PATIENT: Virginia [name] DOB: November 04, 1980  REFERRING DOCTOR OR PCP:    Blair Heys SOURCE: patient, admit/discharge hospital notes, imaging reports, MRI's on PACS  _________________________________   HISTORICAL  CHIEF COMPLAINT:  Chief Complaint  Patient presents with  . Follow-up    RM 12, alone. Last seen 12/25/2019.   . Multiple Sclerosis    On Tysabri. Last: 05/26/20 Next: 06/23/20 (missed this appt) r/s for 06/28/20. Last JCV 12/25/19 negative, index: 0.19. Receives at [facility] with intrafusion.    HISTORY OF PRESENT ILLNESS:  Virginia [name] is a 39 y.o. woman with relapsing remitting multiple sclerosis who also has back pain.  Update 06/24/2020: She is on Tysabri 300 mg q4 weeks.  Last JCV Ab was 0.19 12/25/2019.   MRI 12/2018 was unchanged compared to 2019.   She has had no exacerbations.   MRI of the brain 01/20/2020 showed scattered T2/FLAIR hyperintense foci in the hemispheres and medulla in a pattern and configuration consistent with chronic demyelinating plaque associated with multiple sclerosis.   Compared to the MRI from 01/17/2019, there are no new lesions.  She is noting more fatigue.   She notes sleep is poor at night.    Mood is fine.     Gait is about the same.  She stumbles but no recent falls.  She feels the burning in her legs, right > left is improved   Still has tingling.  She is on gabapentin 600 mg po qid.   She is noting more muscle cramps in her legs, esp calves.    She notes some jumping in her vision which is unchange but no actual visual loss.   She notes vertigo spells lasting 5-30 minutes.   Movements may trigger a dizzy spell or worsen one in progress.   She denies hearing loss.   Meclizine helps some but makes her sleepy.  Bladder function is fine.    She has low back pain, worse when she stands.  Sometimes she gets pain in the feet as well.  She has a disc herniation at L5-S1.  She also has neck pain but not arm pain.     MS  History:   In mid May 2017, she was starting to experience occasional lightheadedness and a spinning vertigo. On 03/19/2016, she had more extreme vertigo and had an episode of syncope. She notices that when she was walking she would veer towards the right. Because of the syncope, she was taken to the emergency room. He had an MRI of the brain that showed several enhancing lesions, potentially worrisome for multiple sclerosis. Additional studies were performed. She received 3 days of IV steroids. The MRI's of the spine did not show any additional plaques. The lumbar puncture showed oligoclonal bands with normal IgG index, consistent with multiple sclerosis.   She started  Aubagio.   MRI in 2018 showed additional foci plus she had tolerability issues.   She has been on Tysabri since 12/26/2016    LABS/LP She underwent a lumbar puncture on 03/21/2016. 4 oligoclonal bands present in the CSF which were not present in the serum, consistent with multiple sclerosis. The IgG index was high normal at 0.6.   There were only 4 white blood cells but 280 red blood cells more consistent with a slightly traumatic tap.    The [facility] Spotted Fever CSF IgG was negative but the IgM was positive at 1.41 (less than 0.9 normal).    ANA and ANCA were both negative.  IMAGING MRI images from 03/20/2016, 03/22/2016 and CT angiogram images from 03/23/2016 reviewed: The MRI of the brain shows several 2/fair hyperintense foci, some in the periventricular and juxtacortical white matter. Most of the foci enhanced after contrast administration. MRI of the cervical and thoracic spine did not show any spinal cord plaques and there was no significant degenerative change.   CT angiogram was essentially normal showing no significant stenosis.  MRI 11/28/2016 showed multiple periventricular, subcortical, pericallosal as well as craniovertebral junction white matter hyperintensities compatible with multiple sclerosis. Multiple enhancing  lesions are noted in the periventricular regions with the largest one in the left posterior frontal subcortical white matter. Incidental large venous angioma is noted in the right parietal region. Compared with MRI scan dated 03/20/2016 there are several new enhancing as well as nonenhancing white matter lesions.  MRI 12/25/2016 of the cervical spine showed two T2 hyperintense foci within the left posterolateral spinal cord, one adjacent to C6-C7 and one adjacent to T1. There may have been subtle signal within the spinal cord adjacent to C6-C7 on the previous scan but the current focus is clearly larger. The focus adjacent to T1 was not evident on the previous MRI. Neither of these 2 foci enhanced with contrast.         There is mild spinal stenosis at C4-C5, C5-C6 and C6-C7, unchanged when compared to the previous MRI. There is mild right foraminal narrowing at C6-C7 but there is no nerve root compression.  MRI 01/10/2018 of the brain showed multiple T2/FLAIR hyperintense foci in the brainstem and in the periventricular, juxtacortical and deep white matter in a pattern and configuration consistent with chronic demyelinating plaque associated with multiple sclerosis.  Compared to the MRI dated 11/28/2016, there is an improved appearance in the lesions that were enhancing at that time no longer do so.  There are no new lesions.  MRI [facility] 01/27/2018 showed, at L4-L5, there is stable mild left facet hypertrophy and mild ligamentum flavum hypertrophy.  There is no nerve root compression.     At L5-S1, there is a stable small central disc herniation causing bilateral lateral recess narrowing.  This contacts both of the S1 nerve roots.  There is no definite change when compared to the previous MRI.  MRI brain 01/17/2019 showed multiple T2/flair hyperintense foci in the hemispheres in a pattern and configuration consistent with chronic demyelinating plaque associated with multiple sclerosis.  Two foci that have been  seen previously in the brainstem are not apparent on the current MRI.  There are no new lesions compared to the 2019 MRI.  None of the foci enhance.    There is a small developmental venous anomaly in the right parietal lobe that is unchanged in appearance.  Otherwise, the enhancement pattern is normal.  MRI of the brain 01/20/2020 showed scattered T2/FLAIR hyperintense foci in the hemispheres and medulla in a pattern and configuration consistent with chronic demyelinating plaque associated with multiple sclerosis.   Compared to the MRI from 01/17/2019, there are no new lesions.     REVIEW OF SYSTEMS: Constitutional: No fevers, chills, sweats, or change in appetite.   She has fatigue. Insomnia much better with temazepam Eyes: No visual changes, double vision, eye pain Ear, nose and throat: No hearing loss, ear pain, nasal congestion, sore throat Cardiovascular: No chest pain, palpitations Respiratory: No shortness of breath at rest or with exertion.   No wheezes GastrointestinaI: No nausea, vomiting, diarrhea, abdominal pain, fecal incontinence Genitourinary: No dysuria, urinary retention.  She reports frequency.  No nocturia. Musculoskeletal: No neck pain, back pain Integumentary: No rash, pruritus, skin lesions Neurological: as above Psychiatric: No depression at this time.  No anxiety Endocrine: No palpitations, diaphoresis, change in appetite, change in weigh or increased thirst Hematologic/Lymphatic: No anemia, purpura, petechiae. Allergic/Immunologic: No itchy/runny eyes, nasal congestion, recent allergic reactions, rashes  ALLERGIES: Allergies  Allergen Reactions  . Codeine Other (See Comments)    Hallucinations   . Onion     HOME MEDICATIONS:  Current Outpatient Medications:  .  amitriptyline (ELAVIL) 50 MG tablet, TAKE 1 TABLET BY MOUTH EVERY DAY AT BEDTIME AS NEEDED FOR SLEEP, Disp: 30 tablet, Rfl: 8 .  atenolol (TENORMIN) 50 MG tablet, TAKE 1 TABLET BY MOUTH EVERY  DAY, Disp: 30 tablet, Rfl: 11 .  baclofen (LIORESAL) 10 MG tablet, Take one pill 3 to 5 times a day as needed for spasticity, Disp: 450 tablet, Rfl: 11 .  clonazePAM (KLONOPIN) 1 MG tablet, TAKE 1 TABLET BY MOUTH TWICE A DAY AS NEEDED FOR ANXIETY, Disp: 20 tablet, Rfl: 1 .  gabapentin (NEURONTIN) 600 MG tablet, Take 1 tablet (600 mg total) by mouth 4 (four) times daily., Disp: 360 tablet, Rfl: 3 .  meclizine (ANTIVERT) 25 MG tablet, TAKE 1 TABLET (25 MG TOTAL) BY MOUTH 3 (THREE) TIMES DAILY AS NEEDED FOR DIZZINESS., Disp: 30 tablet, Rfl: 0 .  metroNIDAZOLE (METROCREAM) 0.75 % cream, Apply topically 2 (two) times daily., Disp: 45 g, Rfl: 0 .  natalizumab (TYSABRI) 300 MG/15ML injection, Inject 300 mg into the vein every 28 (twenty-eight) days., Disp: , Rfl:  .  pantoprazole (PROTONIX) 40 MG tablet, TAKE 1 TABLET BY MOUTH TWICE A DAY, Disp: 60 tablet, Rfl: 5 .  sertraline (ZOLOFT) 100 MG tablet, Take 1.5 tablets (150 mg total) by mouth daily., Disp: 135 tablet, Rfl: 0 .  sucralfate (CARAFATE) 1 g tablet, TAKE 1 TABLET BY MOUTH 4 TIMES DAILY - WITH MEALS AND AT BEDTIME., Disp: 120 tablet, Rfl: 1  PAST MEDICAL HISTORY: Past Medical History:  Diagnosis Date  . Anxiety   . Headache   . Insomnia   . Multiple sclerosis (HCC)   . Nystagmus 02/15/2017  . Syncope and collapse     PAST SURGICAL HISTORY: No past surgical history on file.  FAMILY HISTORY: Family History  Problem Relation Age of Onset  . Emphysema Mother   . Osteoarthritis Mother   . Anxiety disorder Mother   . Other Father   . Suicidality Father   . ALS Maternal Grandmother   . Colon cancer Neg Hx   . Colon polyps Neg Hx   . Esophageal cancer Neg Hx   . Rectal cancer Neg Hx   . Stomach cancer Neg Hx     SOCIAL HISTORY:  Social History   Socioeconomic History  . Marital status: Married    Spouse name: Not on file  . Number of children: Not on file  . Years of education: Not on file  . Highest education level: Not on  file  Occupational History  . Not on file  Tobacco Use  . Smoking status: Current Every Day Smoker    Types: E-cigarettes  . Smokeless tobacco: Never Used  Substance and Sexual Activity  . Alcohol use: Yes    Alcohol/week: 0.0 standard drinks    Comment: rarely  . Drug use: No  . Sexual activity: Not on file  Other Topics Concern  . Not on file  Social History Narrative  . Not on  file   Social Determinants of Health   Financial Resource Strain:   . Difficulty of Paying Living Expenses: Not on file  Food Insecurity:   . Worried About Programme researcher, broadcasting/film/video in the Last Year: Not on file  . Ran Out of Food in the Last Year: Not on file  Transportation Needs:   . Lack of Transportation (Medical): Not on file  . Lack of Transportation (Non-Medical): Not on file  Physical Activity:   . Days of Exercise per Week: Not on file  . Minutes of Exercise per Session: Not on file  Stress:   . Feeling of Stress : Not on file  Social Connections:   . Frequency of Communication with Friends and Family: Not on file  . Frequency of Social Gatherings with Friends and Family: Not on file  . Attends Religious Services: Not on file  . Active Member of Clubs or Organizations: Not on file  . Attends Banker Meetings: Not on file  . Marital Status: Not on file  Intimate Partner Violence:   . Fear of Current or Ex-Partner: Not on file  . Emotionally Abused: Not on file  . Physically Abused: Not on file  . Sexually Abused: Not on file     PHYSICAL EXAM  Vitals:   06/24/20 1417  BP: 109/71  Pulse: 78  Weight: 196 lb 8 oz (89.1 kg)  Height: 5\' 4"  (1.626 m)    Body mass index is 33.73 kg/m.   General: The patient is well-developed and well-nourished and in no acute distress,.  Mild pedal edema on the left.   She has cerumen in right ear canal partially blocking view of TM.    Neurologic Exam  Mental status: The patient is alert and oriented x 3 at the time of the  examination. The patient has apparent normal recent and remote memory, with an apparently normal attention span and concentration ability.   Speech is normal.  Cranial nerves: Extraocular muscles are full.  Now no end gaze nystagmus to the left..  Facial strength and sensation is normal. Trapezius strength is normal. No obvious hearing deficits are noted.  Motor:  Muscle bulk is normal.  Strength is 5/5. Tone is mildly increased in the legs.  Sensory: She had intact sensation to touch and vibration in arms but reduced sensation to touch/temp and vibration in right leg.    Coordination: Cerebellar testing reveals good finger-nose-finger and heel-to-shin bilaterally.  Gait and station: Station is normal.   The gait is minimally wide.  Tandem gait is mildly wide.  Romberg is negative.  Reflexes: Deep tendon reflexes are symmetric and normal in arms.  DTRs are brisk at knees with some spread but no ankle clonus.    DIAGNOSTIC DATA (LABS, IMAGING, TESTING) - I reviewed patient records, labs, notes, testing and imaging myself where available.  Lab Results  Component Value Date   WBC 7.7 12/25/2019   HGB 14.1 12/25/2019   HCT 41.2 12/25/2019   MCV 92 12/25/2019   PLT 308 12/25/2019      Component Value Date/Time   NA 137 03/19/2016 1929   K 3.7 03/19/2016 1929   CL 110 03/19/2016 1929   CO2 18 (L) 03/19/2016 1929   GLUCOSE 100 (H) 03/19/2016 1929   BUN 16 03/19/2016 1929   CREATININE 1.04 (H) 03/19/2018 1417   CALCIUM 8.9 03/19/2016 1929   PROT 6.1 12/04/2016 1131   ALBUMIN 3.7 12/04/2016 1131   AST 11 12/04/2016 1131  ALT 13 12/04/2016 1131   ALKPHOS 73 12/04/2016 1131   BILITOT <0.2 12/04/2016 1131   GFRNONAA >60 03/19/2018 1417   GFRAA >60 03/19/2018 1417    Lab Results  Component Value Date   TSH 1.010 01/01/2018       ASSESSMENT AND PLAN    1. Multiple sclerosis (HCC)   2. High risk medication use   3. Other fatigue   4. Vertigo of central origin   5.  Numbness     1.    Continue Tysabri 300 mg every 4 weeks.  Check JCV antibody and CBC. 2. Continue baclofen up to 5 times a day   3.  Continue gabapentin 600 mg 3-4 times a day 4.     Return 5-6 months.  Call sooner if there are new or worsening neurologic symptoms.  Tishie Altmann A. Epimenio Foot, MD, PhD 06/24/2020, 2:50 PM Certified in Neurology, Clinical Neurophysiology, Sleep Medicine, Pain Medicine and Neuroimaging  [facility] [address] [place], [place] [address] [phone]

## 2020-06-25 LAB — CBC WITH DIFFERENTIAL/PLATELET
Basophils Absolute: 0.1 10*3/uL (ref 0.0–0.2)
Basos: 1 %
EOS (ABSOLUTE): 0.4 10*3/uL (ref 0.0–0.4)
Eos: 5 %
Hematocrit: 40.7 % (ref 34.0–46.6)
Hemoglobin: 13.7 g/dL (ref 11.1–15.9)
Immature Grans (Abs): 0 10*3/uL (ref 0.0–0.1)
Immature Granulocytes: 0 %
Lymphocytes Absolute: 3.4 10*3/uL — ABNORMAL HIGH (ref 0.7–3.1)
Lymphs: 43 %
MCH: 32 pg (ref 26.6–33.0)
MCHC: 33.7 g/dL (ref 31.5–35.7)
MCV: 95 fL (ref 79–97)
Monocytes Absolute: 0.6 10*3/uL (ref 0.1–0.9)
Monocytes: 8 %
Neutrophils Absolute: 3.4 10*3/uL (ref 1.4–7.0)
Neutrophils: 43 %
Platelets: 330 10*3/uL (ref 150–450)
RBC: 4.28 x10E6/uL (ref 3.77–5.28)
RDW: 13 % (ref 11.7–15.4)
WBC: 7.9 10*3/uL (ref 3.4–10.8)

## 2020-06-28 ENCOUNTER — Other Ambulatory Visit: Payer: Self-pay | Admitting: *Deleted

## 2020-06-28 ENCOUNTER — Encounter: Payer: Self-pay | Admitting: Family Medicine

## 2020-06-28 DIAGNOSIS — G35 Multiple sclerosis: Secondary | ICD-10-CM | POA: Diagnosis not present

## 2020-06-28 MED ORDER — SUMATRIPTAN SUCCINATE 50 MG PO TABS: 50.0000 mg | ORAL_TABLET | ORAL | 5 refills | Status: DC | PRN

## 2020-06-28 NOTE — Telephone Encounter (Signed)
JCV ab drawn on 06/24/20 negative, index: 0.15

## 2020-06-30 ENCOUNTER — Encounter: Payer: Self-pay | Admitting: Family Medicine

## 2020-07-01 ENCOUNTER — Other Ambulatory Visit: Payer: Self-pay

## 2020-07-01 ENCOUNTER — Telehealth (INDEPENDENT_AMBULATORY_CARE_PROVIDER_SITE_OTHER): Payer: BC Managed Care – PPO | Admitting: Nurse Practitioner

## 2020-07-01 DIAGNOSIS — J069 Acute upper respiratory infection, unspecified: Secondary | ICD-10-CM

## 2020-07-01 DIAGNOSIS — J00 Acute nasopharyngitis [common cold]: Secondary | ICD-10-CM

## 2020-07-01 MED ORDER — FLUTICASONE PROPIONATE 50 MCG/ACT NA SUSP: 2.0000 | Freq: Every day | NASAL | 6 refills | Status: DC

## 2020-07-01 NOTE — Progress Notes (Signed)
Established Patient Office Visit  Subjective:  Patient ID: Virginia [name], female    DOB: 05-Oct-1981  Age: 39 y.o. MRN: [id number]  CC:   HPI Virginia [name] is a 39 year old presenting for curbside office visit for sxs of general body ache, nasal congestion and nasal drainage, chills, unknown fever, h/a, non productive mild cough. She did not have loss of taste or smell, gu/gi sxs.   Sxs started two days ago after attending a birthday party not wearing mask over the weekend. She does not know if anyone at the party has sxs or has had COVID or testing for COVID.   No Txs tried  No respiratory chronic conditions or immune diseases known  Not covid vaccinated  No mask wearing   Past Medical History:  Diagnosis Date  . Anxiety   . Headache   . Insomnia   . Multiple sclerosis (HCC)   . Neuromuscular disorder (HCC)    [employer]reesia 06/30/2020  . Nystagmus 02/15/2017  . Syncope and collapse     No past surgical history on file.  Family History  Problem Relation Age of Onset  . Emphysema Mother   . Osteoarthritis Mother   . Anxiety disorder Mother   . Other Father   . Suicidality Father   . ALS Maternal Grandmother   . Colon cancer Neg Hx   . Colon polyps Neg Hx   . Esophageal cancer Neg Hx   . Rectal cancer Neg Hx   . Stomach cancer Neg Hx     Social History   Socioeconomic History  . Marital status: Married    Spouse name: Not on file  . Number of children: Not on file  . Years of education: Not on file  . Highest education level: Not on file  Occupational History  . Not on file  Tobacco Use  . Smoking status: Current Every Day Smoker    Types: E-cigarettes  . Smokeless tobacco: Never Used  Substance and Sexual Activity  . Alcohol use: Yes    Alcohol/week: 0.0 standard drinks    Comment: rarely  . Drug use: No  . Sexual activity: Not on file  Other Topics Concern  . Not on file  Social History Narrative  . Not on file   Social Determinants of Health    Financial Resource Strain:   . Difficulty of Paying Living Expenses: Not on file  Food Insecurity:   . Worried About Programme researcher, broadcasting/film/video in the Last Year: Not on file  . Ran Out of Food in the Last Year: Not on file  Transportation Needs:   . Lack of Transportation (Medical): Not on file  . Lack of Transportation (Non-Medical): Not on file  Physical Activity:   . Days of Exercise per Week: Not on file  . Minutes of Exercise per Session: Not on file  Stress:   . Feeling of Stress : Not on file  Social Connections:   . Frequency of Communication with Friends and Family: Not on file  . Frequency of Social Gatherings with Friends and Family: Not on file  . Attends Religious Services: Not on file  . Active Member of Clubs or Organizations: Not on file  . Attends Banker Meetings: Not on file  . Marital Status: Not on file  Intimate Partner Violence:   . Fear of Current or Ex-Partner: Not on file  . Emotionally Abused: Not on file  . Physically Abused: Not on file  . Sexually Abused:  Not on file    Outpatient Medications Prior to Visit  Medication Sig Dispense Refill  . amitriptyline (ELAVIL) 50 MG tablet TAKE 1 TABLET BY MOUTH EVERY DAY AT BEDTIME AS NEEDED FOR SLEEP 30 tablet 8  . atenolol (TENORMIN) 50 MG tablet TAKE 1 TABLET BY MOUTH EVERY DAY 30 tablet 11  . baclofen (LIORESAL) 10 MG tablet Take one pill 3 to 5 times a day as needed for spasticity 450 tablet 11  . clonazePAM (KLONOPIN) 1 MG tablet TAKE 1 TABLET BY MOUTH TWICE A DAY AS NEEDED FOR ANXIETY 20 tablet 1  . gabapentin (NEURONTIN) 600 MG tablet Take 1 tablet (600 mg total) by mouth 4 (four) times daily. 360 tablet 3  . meclizine (ANTIVERT) 25 MG tablet TAKE 1 TABLET (25 MG TOTAL) BY MOUTH 3 (THREE) TIMES DAILY AS NEEDED FOR DIZZINESS. 30 tablet 0  . metroNIDAZOLE (METROCREAM) 0.75 % cream Apply topically 2 (two) times daily. 45 g 0  . natalizumab (TYSABRI) 300 MG/15ML injection Inject 300 mg into the vein  every 28 (twenty-eight) days.    . pantoprazole (PROTONIX) 40 MG tablet TAKE 1 TABLET BY MOUTH TWICE A DAY 60 tablet 5  . sertraline (ZOLOFT) 100 MG tablet Take 1.5 tablets (150 mg total) by mouth daily. 135 tablet 0  . sucralfate (CARAFATE) 1 g tablet TAKE 1 TABLET BY MOUTH 4 TIMES DAILY - WITH MEALS AND AT BEDTIME. 120 tablet 1  . SUMAtriptan (IMITREX) 50 MG tablet Take 1 tablet (50 mg total) by mouth every 2 (two) hours as needed for migraine. May repeat in 2 hours if headache persists or recurs. 10 tablet 5   No facility-administered medications prior to visit.    Allergies  Allergen Reactions  . Codeine Other (See Comments)    Hallucinations   . Onion     ROS Review of Systems  All other systems reviewed and are negative.     Objective:    Physical Exam Vitals and nursing note reviewed.  Constitutional:      General: She is not in acute distress.    Appearance: Normal appearance. She is well-developed and well-groomed. She is not toxic-appearing.  HENT:     Head: Normocephalic and atraumatic.     Nose: Congestion and rhinorrhea present.     Mouth/Throat:     Mouth: Mucous membranes are moist.     Pharynx: Oropharynx is clear.  Eyes:     Extraocular Movements: Extraocular movements intact.     Conjunctiva/sclera: Conjunctivae normal.     Pupils: Pupils are equal, round, and reactive to light.  Cardiovascular:     Rate and Rhythm: Normal rate and regular rhythm.     Pulses: Normal pulses.     Heart sounds: Normal heart sounds, S1 normal and S2 normal.  Pulmonary:     Effort: Pulmonary effort is normal.     Breath sounds: Normal breath sounds.  Musculoskeletal:        General: Normal range of motion.     Cervical back: Normal range of motion and neck supple.     Right lower leg: No edema.     Left lower leg: No edema.  Lymphadenopathy:     Head:     Right side of head: No submental, submandibular or tonsillar adenopathy.     Left side of head: No submental,  submandibular or tonsillar adenopathy.     Cervical: Cervical adenopathy present.  Skin:    General: Skin is warm and dry.  Coloration: Skin is not cyanotic, jaundiced or pale.  Neurological:     General: No focal deficit present.     Mental Status: She is alert and oriented to person, place, and time.  Psychiatric:        Attention and Perception: Attention normal.        Mood and Affect: Mood normal.        Speech: Speech normal.        Behavior: Behavior normal. Behavior is cooperative.     There were no vitals taken for this visit. Wt Readings from Last 3 Encounters:  06/24/20 196 lb 8 oz (89.1 kg)  01/19/20 226 lb (102.5 kg)  01/01/20 227 lb (103 kg)     Health Maintenance Due  Topic Date Due  . Hepatitis C Screening  Never done  . COVID-19 Vaccine (1) Never done  . HIV Screening  Never done  . PAP SMEAR-Modifier  Never done  . INFLUENZA VACCINE  Never done    There are no preventive care reminders to display for this patient.  Lab Results  Component Value Date   TSH 1.010 01/01/2018   Lab Results  Component Value Date   WBC 7.9 06/24/2020   HGB 13.7 06/24/2020   HCT 40.7 06/24/2020   MCV 95 06/24/2020   PLT 330 06/24/2020   Lab Results  Component Value Date   NA 137 03/19/2016   K 3.7 03/19/2016   CO2 18 (L) 03/19/2016   GLUCOSE 100 (H) 03/19/2016   BUN 16 03/19/2016   CREATININE 1.04 (H) 03/19/2018   BILITOT <0.2 12/04/2016   ALKPHOS 73 12/04/2016   AST 11 12/04/2016   ALT 13 12/04/2016   PROT 6.1 12/04/2016   ALBUMIN 3.7 12/04/2016   CALCIUM 8.9 03/19/2016   ANIONGAP 9 03/19/2016   No results found for: CHOL No results found for: HDL No results found for: LDLCALC No results found for: TRIG No results found for: CHOLHDL No results found for: YWVP7T    Assessment & Plan:   Problem List Items Addressed This Visit    None    Visit Diagnoses    Viral upper respiratory illness    -  Primary   Relevant Medications   fluticasone  (FLONASE) 50 MCG/ACT nasal spray   Other Relevant Orders   SARS-COV-2 RNA,(COVID-19) QUAL NAAT   Acute rhinitis       Relevant Medications   fluticasone (FLONASE) 50 MCG/ACT nasal spray   Other Relevant Orders   SARS-COV-2 RNA,(COVID-19) QUAL NAAT    Covid testing completed  Get plenty of Rest and drink plenty of fluids. May take over the counter medicines to relieve symptoms. These can include over-the-counter medicine for pain and fever such as tylenol/ibuprofen, medicines for cough or congestion, warm salt water gargles to soothe throat and medicines to relieve diarrhea.  For negative COVID test with continued sxs will consider Flu test, another COVID test  Meds ordered this encounter  Medications  . fluticasone (FLONASE) 50 MCG/ACT nasal spray    Sig: Place 2 sprays into both nostrils daily.    Dispense:  16 g    Refill:  6    Follow-up: Return if symptoms worsen or fail to improve.    Elmore Guise, FNP

## 2020-07-02 ENCOUNTER — Telehealth: Payer: BC Managed Care – PPO | Admitting: Family Medicine

## 2020-07-02 LAB — SARS-COV-2 RNA,(COVID-19) QUALITATIVE NAAT: SARS CoV2 RNA: NOT DETECTED

## 2020-07-06 NOTE — Progress Notes (Signed)
Covid Not Detected

## 2020-07-08 NOTE — Progress Notes (Signed)
Virtual Visit via Video Note  I connected with Virginia [name] on 07/15/20 at  1:00 PM EDT by a video enabled telemedicine application and verified that I am speaking with the correct person using two identifiers.   I discussed the limitations of evaluation and management by telemedicine and the availability of in person appointments. The patient expressed understanding and agreed to proceed.     I discussed the assessment and treatment plan with the patient. The patient was provided an opportunity to ask questions and all were answered. The patient agreed with the plan and demonstrated an understanding of the instructions.   The patient was advised to call back or seek an in-person evaluation if the symptoms worsen or if the condition fails to improve as anticipated.  Location: patient- home, provider- home office   I provided 14 minutes of non-face-to-face time during this encounter.   Neysa Hotter, MD    [facility] MD/PA/NP OP Progress Note  07/15/2020 1:18 PM Virginia [name]  MRN:  [id number]  Chief Complaint:  Chief Complaint    Depression; Follow-up; Anxiety     HPI:  This is a follow-up appointment for depression and anxiety.  She states that she has had worsening in and anxiety without significant reason.  She partly attributes it to pain secondary to MS. her provider uptitrated baclofen, which has minimal benefit.  She reports good relationship with her son.  She tends to feel fatigued, and has not been able to enjoy anything since the last visit.  She has initial and middle insomnia.  She denies feeling depressed.  She has good concentration.  She is healthier diet, and she has lost some weight.  She denies SI.  She feels anxious and tense.  She is willing to try higher dose of sertraline.    Daily routine: clean, walks her dog, cleans,  Employment: on disability for MS. 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 son, age 40 (talks with him weekly, had good relationship)   Visit Diagnosis:    ICD-10-CM   1. Panic disorder  F41.0 sertraline (ZOLOFT) 100 MG tablet  2. Major depressive disorder with single episode, in partial remission (HCC)  F32.4 sertraline (ZOLOFT) 100 MG tablet    Past Psychiatric History: Please see initial evaluation for full details. I have reviewed the history. No updates at this time.     Past Medical History:  Past Medical History:  Diagnosis Date  . Anxiety   . Headache   . Insomnia   . Multiple sclerosis (HCC)   . Neuromuscular disorder (HCC)    Phreesia 06/30/2020  . Nystagmus 02/15/2017  . Syncope and collapse    No past surgical history on file.  Family Psychiatric History: Please see initial evaluation for full details. I have reviewed the history. No updates at this time.     Family History:  Family History  Problem Relation Age of Onset  . Emphysema Mother   . Osteoarthritis Mother   . Anxiety disorder Mother   . Other Father   . Suicidality Father   . ALS Maternal Grandmother   . Colon cancer Neg Hx   . Colon polyps Neg Hx   . Esophageal cancer Neg Hx   . Rectal cancer Neg Hx   . Stomach cancer Neg Hx     Social History:  Social History   Socioeconomic History  . Marital  status: Married    Spouse name: Not on file  . Number of children: Not on file  . Years of education: Not on file  . Highest education level: Not on file  Occupational History  . Not on file  Tobacco Use  . Smoking status: Current Every Day Smoker    Types: E-cigarettes  . Smokeless tobacco: Never Used  Substance and Sexual Activity  . Alcohol use: Yes    Alcohol/week: 0.0 standard drinks    Comment: rarely  . Drug use: No  . Sexual activity: Not on file  Other Topics Concern  . Not on file  Social History Narrative  . Not on file   Social Determinants of Health   Financial Resource Strain:   .  Difficulty of Paying Living Expenses: Not on file  Food Insecurity:   . Worried About Programme researcher, broadcasting/film/video in the Last Year: Not on file  . Ran Out of Food in the Last Year: Not on file  Transportation Needs:   . Lack of Transportation (Medical): Not on file  . Lack of Transportation (Non-Medical): Not on file  Physical Activity:   . Days of Exercise per Week: Not on file  . Minutes of Exercise per Session: Not on file  Stress:   . Feeling of Stress : Not on file  Social Connections:   . Frequency of Communication with Friends and Family: Not on file  . Frequency of Social Gatherings with Friends and Family: Not on file  . Attends Religious Services: Not on file  . Active Member of Clubs or Organizations: Not on file  . Attends Banker Meetings: Not on file  . Marital Status: Not on file    Allergies:  Allergies  Allergen Reactions  . Codeine Other (See Comments)    Hallucinations   . Onion     Metabolic Disorder Labs: No results found for: HGBA1C, MPG No results found for: PROLACTIN No results found for: CHOL, TRIG, HDL, CHOLHDL, VLDL, LDLCALC Lab Results  Component Value Date   TSH 1.010 01/01/2018   TSH 1.400 04/25/2017    Therapeutic Level Labs: No results found for: LITHIUM No results found for: VALPROATE No components found for:  CBMZ  Current Medications: Current Outpatient Medications  Medication Sig Dispense Refill  . amitriptyline (ELAVIL) 50 MG tablet TAKE 1 TABLET BY MOUTH EVERY DAY AT BEDTIME AS NEEDED FOR SLEEP 30 tablet 8  . atenolol (TENORMIN) 50 MG tablet TAKE 1 TABLET BY MOUTH EVERY DAY 30 tablet 11  . baclofen (LIORESAL) 10 MG tablet Take one pill 3 to 5 times a day as needed for spasticity 450 tablet 11  . clonazePAM (KLONOPIN) 1 MG tablet TAKE 1 TABLET BY MOUTH TWICE A DAY AS NEEDED FOR ANXIETY 20 tablet 1  . fluticasone (FLONASE) 50 MCG/ACT nasal spray Place 2 sprays into both nostrils daily. 16 g 6  . gabapentin (NEURONTIN) 600  MG tablet Take 1 tablet (600 mg total) by mouth 4 (four) times daily. 360 tablet 3  . meclizine (ANTIVERT) 25 MG tablet TAKE 1 TABLET (25 MG TOTAL) BY MOUTH 3 (THREE) TIMES DAILY AS NEEDED FOR DIZZINESS. 30 tablet 0  . metroNIDAZOLE (METROCREAM) 0.75 % cream Apply topically 2 (two) times daily. 45 g 0  . natalizumab (TYSABRI) 300 MG/15ML injection Inject 300 mg into the vein every 28 (twenty-eight) days.    . pantoprazole (PROTONIX) 40 MG tablet TAKE 1 TABLET BY MOUTH TWICE A DAY 60 tablet 5  .  sertraline (ZOLOFT) 100 MG tablet Take 2 tablets (200 mg total) by mouth daily. 180 tablet 0  . sucralfate (CARAFATE) 1 g tablet TAKE 1 TABLET BY MOUTH 4 TIMES DAILY - WITH MEALS AND AT BEDTIME. 120 tablet 1  . SUMAtriptan (IMITREX) 50 MG tablet Take 1 tablet (50 mg total) by mouth every 2 (two) hours as needed for migraine. May repeat in 2 hours if headache persists or recurs. 10 tablet 5   No current facility-administered medications for this visit.     Musculoskeletal: Strength & Muscle Tone: N/A Gait & Station: N/A Patient leans: N/A  Psychiatric Specialty Exam: Review of Systems  Psychiatric/Behavioral: Positive for sleep disturbance. Negative for agitation, behavioral problems, confusion, decreased concentration, dysphoric mood, hallucinations, self-injury and suicidal ideas. The patient is nervous/anxious. The patient is not hyperactive.   All other systems reviewed and are negative.   There were no vitals taken for this visit.There is no height or weight on file to calculate BMI.  General Appearance: Fairly Groomed  Eye Contact:  Good  Speech:  Clear and Coherent  Volume:  Normal  Mood:  tired  Affect:  Appropriate, Congruent and slight fatigue  Thought Process:  Coherent  Orientation:  Full (Time, Place, and Person)  Thought Content: Logical   Suicidal Thoughts:  No  Homicidal Thoughts:  No  Memory:  Immediate;   Good  Judgement:  Good  Insight:  Fair  Psychomotor Activity:   Normal  Concentration:  Concentration: Good and Attention Span: Good  Recall:  Good  Fund of Knowledge: Good  Language: Good  Akathisia:  No  Handed:  Right  AIMS (if indicated): not done  Assets:  Communication Skills Desire for Improvement  ADL's:  Intact  Cognition: WNL  Sleep:  Poor   Screenings: PHQ2-9     Office Visit from 07/15/2018 in [facility] Office Visit from 06/10/2018 in [facility] Office Visit from 01/28/2018 in [facility]  PHQ-2 Total Score 0 0 1  PHQ-9 Total Score -- -- 8       Assessment and Plan:  Romanita Channing is a 39 y.o. year old female with a history of panic disorder, depression,multiple sclerosis,borderline sleep apnea (study in 2019), who presents for follow up appointment for below.   1. Panic disorder 2. Major depressive disorder with single episode, in partial remission (HCC) She reports worsening in anxiety without significant triggers.  Psychosocial stressors includes MS. Will uptitrate sertraline to optimize treatment for anxiety and depression.  She will greatly benefit from CBT.  She is advised to contact the office if she is interested in this.   3. Insomnia, unspecified type She continues to report initial and middle insomnia, daytime fatigue.  Although some of her symptoms can be attributable to MS, referral was made to rule out sleep apnea.   Plan 1.Increase sertraline 200 mg daily 2. Next appointment:11/18 at 1 PM for 20 mins, video 4. Referral for evaluation of sleep apnea - onclonazepam0.5-1mg  at night as needed for sleep , prescribed by PCP - On melatonin 10 mg at night  - TSH checked this year, normal according to the patient - She is on gabapentin 600 mg QID, baclofen 10 mg TID to five times a day,  Past trials of medication:lexapro,venlafaxine,duloxetine (gained 40 lbs), Trintellix, Trazodone, doxepin  The patient demonstrates the following risk factors for  suicide: Chronic risk factors for suicide include:psychiatric disorder ofanxiety. Acute risk factorsfor suicide include: unemployment. Protective factorsfor this patient  include: positive social support, coping skills and hope for the future. Considering these factors, the overall suicide risk at this point appears to below. Patientisappropriate for outpatient follow up.   Neysa Hotter, MD 07/15/2020, 1:18 PM

## 2020-07-15 ENCOUNTER — Telehealth (INDEPENDENT_AMBULATORY_CARE_PROVIDER_SITE_OTHER): Payer: BC Managed Care – PPO | Admitting: Psychiatry

## 2020-07-15 ENCOUNTER — Encounter (HOSPITAL_COMMUNITY): Payer: Self-pay | Admitting: Psychiatry

## 2020-07-15 ENCOUNTER — Other Ambulatory Visit: Payer: Self-pay

## 2020-07-15 ENCOUNTER — Other Ambulatory Visit: Payer: Self-pay | Admitting: Family Medicine

## 2020-07-15 DIAGNOSIS — F410 Panic disorder [episodic paroxysmal anxiety] without agoraphobia: Secondary | ICD-10-CM | POA: Diagnosis not present

## 2020-07-15 DIAGNOSIS — F411 Generalized anxiety disorder: Secondary | ICD-10-CM

## 2020-07-15 DIAGNOSIS — F324 Major depressive disorder, single episode, in partial remission: Secondary | ICD-10-CM

## 2020-07-15 MED ORDER — SERTRALINE HCL 100 MG PO TABS: 200.0000 mg | ORAL_TABLET | Freq: Every day | ORAL | 0 refills | Status: DC

## 2020-07-15 NOTE — Patient Instructions (Signed)
1.Increase sertraline 200 mg daily 2. Next appointment:11/18 at 1 PM

## 2020-07-15 NOTE — Telephone Encounter (Signed)
Ok to refill??  Last office visit 01/19/2020.  Last refill 06/17/2020.

## 2020-07-21 ENCOUNTER — Other Ambulatory Visit: Payer: Self-pay | Admitting: Family Medicine

## 2020-07-21 ENCOUNTER — Other Ambulatory Visit: Payer: Self-pay | Admitting: Neurology

## 2020-07-28 ENCOUNTER — Other Ambulatory Visit: Payer: Self-pay | Admitting: *Deleted

## 2020-07-28 ENCOUNTER — Other Ambulatory Visit: Payer: Self-pay | Admitting: Family Medicine

## 2020-07-28 ENCOUNTER — Other Ambulatory Visit: Payer: Self-pay | Admitting: Neurology

## 2020-07-28 MED ORDER — BACLOFEN 10 MG PO TABS: ORAL_TABLET | ORAL | 11 refills | Status: DC

## 2020-08-02 DIAGNOSIS — G35 Multiple sclerosis: Secondary | ICD-10-CM | POA: Diagnosis not present

## 2020-08-11 ENCOUNTER — Other Ambulatory Visit: Payer: Self-pay | Admitting: Family Medicine

## 2020-08-11 ENCOUNTER — Other Ambulatory Visit: Payer: Self-pay | Admitting: *Deleted

## 2020-08-11 DIAGNOSIS — F411 Generalized anxiety disorder: Secondary | ICD-10-CM

## 2020-08-11 MED ORDER — TOPIRAMATE 50 MG PO TABS: ORAL_TABLET | ORAL | 5 refills | Status: DC

## 2020-08-11 NOTE — Telephone Encounter (Signed)
Last Office Visit - 07/01/20 Last refilled -  07/15/20

## 2020-08-24 ENCOUNTER — Other Ambulatory Visit: Payer: Self-pay

## 2020-08-24 ENCOUNTER — Ambulatory Visit (INDEPENDENT_AMBULATORY_CARE_PROVIDER_SITE_OTHER): Payer: BC Managed Care – PPO | Admitting: Family Medicine

## 2020-08-24 VITALS — BP 128/80 | HR 89 | Temp 98.3°F | Ht 64.0 in | Wt 202.0 lb

## 2020-08-24 DIAGNOSIS — Z79899 Other long term (current) drug therapy: Secondary | ICD-10-CM

## 2020-08-24 DIAGNOSIS — R270 Ataxia, unspecified: Secondary | ICD-10-CM | POA: Diagnosis not present

## 2020-08-24 NOTE — Progress Notes (Signed)
Subjective:    Patient ID: Virginia [name], female    DOB: September 03, 1981, 39 y.o.   MRN: [id number]  Patient states that I am "out of it".  She states that she feels lethargic, groggy, confused.  She reports trouble with her short-term memory.  She also reports ataxia.  She reports that she is "bouncing off the walls" when she is walking around her house.  However she states that she is unable to sleep at night and does not want to quit any medication that helps her sleep.  She saw her psychiatrist in September and they increased her Zoloft to 200 mg a day.  Recently she has been having more headaches and her neurologist started her on Topamax approximately 2 weeks ago.  Over the last 2 weeks the Topamax has been uptitrated.  Today the patient appears impaired.  She has delayed reaction time   She is slow to answer questions. She states that she feels "drunk.  Patient has significant polypharmacy including amitriptyline, gabapentin, baclofen, Klonopin, Zoloft, Topamax.   Past Medical History:  Diagnosis Date  . Anxiety   . Headache   . Insomnia   . Multiple sclerosis (HCC)   . Neuromuscular disorder (HCC)    Phreesia 06/30/2020  . Nystagmus 02/15/2017  . Syncope and collapse    No past surgical history on file. Current Outpatient Medications on File Prior to Visit  Medication Sig Dispense Refill  . amitriptyline (ELAVIL) 50 MG tablet TAKE 1 TABLET BY MOUTH EVERY DAY AT BEDTIME AS NEEDED FOR SLEEP 30 tablet 8  . atenolol (TENORMIN) 50 MG tablet TAKE 1 TABLET BY MOUTH EVERY DAY 30 tablet 11  . baclofen (LIORESAL) 10 MG tablet Take one pill 3 to 5 times a day as needed for spasticity 450 tablet 11  . clonazePAM (KLONOPIN) 1 MG tablet TAKE 1 TABLET BY MOUTH TWICE A DAY AS NEEDED FOR ANXIETY 20 tablet 1  . fluticasone (FLONASE) 50 MCG/ACT nasal spray Place 2 sprays into both nostrils daily. 16 g 6  . gabapentin (NEURONTIN) 600 MG tablet TAKE 1 TABLET (600 MG TOTAL) BY MOUTH 4 (FOUR) TIMES DAILY. 360  tablet 3  . meclizine (ANTIVERT) 25 MG tablet TAKE 1 TABLET (25 MG TOTAL) BY MOUTH 3 (THREE) TIMES DAILY AS NEEDED FOR DIZZINESS. 30 tablet 0  . metroNIDAZOLE (METROCREAM) 0.75 % cream APPLY TO AFFECTED AREA TWICE A DAY 45 g 0  . natalizumab (TYSABRI) 300 MG/15ML injection Inject 300 mg into the vein every 28 (twenty-eight) days.    . pantoprazole (PROTONIX) 40 MG tablet TAKE 1 TABLET BY MOUTH TWICE A DAY 60 tablet 5  . sertraline (ZOLOFT) 100 MG tablet Take 2 tablets (200 mg total) by mouth daily. 180 tablet 0  . sucralfate (CARAFATE) 1 g tablet TAKE 1 TABLET BY MOUTH 4 TIMES DAILY - WITH MEALS AND AT BEDTIME. 120 tablet 1  . SUMAtriptan (IMITREX) 50 MG tablet Take 1 tablet (50 mg total) by mouth every 2 (two) hours as needed for migraine. May repeat in 2 hours if headache persists or recurs. 10 tablet 5  . topiramate (TOPAMAX) 50 MG tablet Start with 1 tablet at bedtime for one week and then go up to 2 tablets at bedtime thereafter. 60 tablet 5   No current facility-administered medications on file prior to visit.   Allergies  Allergen Reactions  . Codeine Other (See Comments)    Hallucinations   . Onion    Social History   Socioeconomic History  .  Marital status: Married    Spouse name: Not on file  . Number of children: Not on file  . Years of education: Not on file  . Highest education level: Not on file  Occupational History  . Not on file  Tobacco Use  . Smoking status: Current Every Day Smoker    Types: E-cigarettes  . Smokeless tobacco: Never Used  Substance and Sexual Activity  . Alcohol use: Yes    Alcohol/week: 0.0 standard drinks    Comment: rarely  . Drug use: No  . Sexual activity: Not on file  Other Topics Concern  . Not on file  Social History Narrative  . Not on file   Social Determinants of Health   Financial Resource Strain:   . Difficulty of Paying Living Expenses: Not on file  Food Insecurity:   . Worried About Programme researcher, broadcasting/film/video in the Last  Year: Not on file  . Ran Out of Food in the Last Year: Not on file  Transportation Needs:   . Lack of Transportation (Medical): Not on file  . Lack of Transportation (Non-Medical): Not on file  Physical Activity:   . Days of Exercise per Week: Not on file  . Minutes of Exercise per Session: Not on file  Stress:   . Feeling of Stress : Not on file  Social Connections:   . Frequency of Communication with Friends and Family: Not on file  . Frequency of Social Gatherings with Friends and Family: Not on file  . Attends Religious Services: Not on file  . Active Member of Clubs or Organizations: Not on file  . Attends Banker Meetings: Not on file  . Marital Status: Not on file  Intimate Partner Violence:   . Fear of Current or Ex-Partner: Not on file  . Emotionally Abused: Not on file  . Physically Abused: Not on file  . Sexually Abused: Not on file    Review of Systems  Neurological: Positive for headaches.  All other systems reviewed and are negative.      Objective:   Physical Exam Vitals reviewed.  Constitutional:      General: She is not in acute distress.    Appearance: Normal appearance. She is not ill-appearing, toxic-appearing or diaphoretic.  HENT:     Mouth/Throat:     Mouth: Mucous membranes are moist. No injury.     Tongue: No lesions. Tongue does not deviate from midline.     Tonsils: No tonsillar exudate or tonsillar abscesses.  Cardiovascular:     Rate and Rhythm: Normal rate and regular rhythm.     Pulses: Normal pulses.     Heart sounds: No murmur heard.  No friction rub. No gallop.   Pulmonary:     Effort: Pulmonary effort is normal. No respiratory distress.     Breath sounds: No stridor. No wheezing, rhonchi or rales.  Chest:     Chest wall: No tenderness.  Musculoskeletal:     Right lower leg: No edema.     Left lower leg: No edema.  Neurological:     Mental Status: She is alert and oriented to person, place, and time. Mental status  is at baseline.     Cranial Nerves: Cranial nerves are intact.     Sensory: Sensation is intact.     Coordination: Coordination is intact.     Gait: Gait is intact.  Psychiatric:        Attention and Perception: Attention and perception normal. She  does not perceive auditory or visual hallucinations.        Mood and Affect: Affect is flat.        Speech: Speech is delayed.        Behavior: Behavior is slowed.        Thought Content: Thought content normal.        Cognition and Memory: She exhibits impaired recent memory.        Judgment: Judgment normal.           Assessment & Plan:  Ataxia - Plan: CBC with Differential/Platelet, COMPLETE METABOLIC PANEL WITH GFR  Polypharmacy  I believe the patient is suffering from ataxia secondary to polypharmacy and drug interactions.  Although she is having more headaches and certainly would benefit from the Topamax at migraine prevention, I feel that the addition of Topamax to the gabapentin coupled with the baclofen and Klonopin is causing her to feel dizzy and sedated and "drunk".  Therefore I recommended discontinuing the Topamax and reassessing later this week to see if her symptoms are improving as the medication is cleared from her system.  She had an MRI of the brain in March that showed no new white matter lesions compared to an MRI from last year.  There is no neurologic deficit on her exam today that would suggest an underlying stroke or space-occupying lesion.

## 2020-08-25 LAB — COMPLETE METABOLIC PANEL WITH GFR
AG Ratio: 1.6 (calc) (ref 1.0–2.5)
ALT: 10 U/L (ref 6–29)
AST: 10 U/L (ref 10–30)
Albumin: 3.8 g/dL (ref 3.6–5.1)
Alkaline phosphatase (APISO): 79 U/L (ref 31–125)
BUN: 16 mg/dL (ref 7–25)
CO2: 20 mmol/L (ref 20–32)
Calcium: 8.9 mg/dL (ref 8.6–10.2)
Chloride: 113 mmol/L — ABNORMAL HIGH (ref 98–110)
Creat: 0.9 mg/dL (ref 0.50–1.10)
GFR, Est African American: 93 mL/min/{1.73_m2} (ref >=60)
GFR, Est Non African American: 80 mL/min/{1.73_m2} (ref >=60)
Globulin: 2.4 g/dL (calc) (ref 1.9–3.7)
Glucose, Bld: 115 mg/dL — ABNORMAL HIGH (ref 65–99)
Potassium: 3.8 mmol/L (ref 3.5–5.3)
Sodium: 140 mmol/L (ref 135–146)
Total Bilirubin: 0.3 mg/dL (ref 0.2–1.2)
Total Protein: 6.2 g/dL (ref 6.1–8.1)

## 2020-08-25 LAB — CBC WITH DIFFERENTIAL/PLATELET
Absolute Monocytes: 865 cells/uL (ref 200–950)
Basophils Absolute: 57 cells/uL (ref 0–200)
Basophils Relative: 0.6 %
Eosinophils Absolute: 523 cells/uL — ABNORMAL HIGH (ref 15–500)
Eosinophils Relative: 5.5 %
HCT: 40 % (ref 35.0–45.0)
Hemoglobin: 13.3 g/dL (ref 11.7–15.5)
Lymphs Abs: 3886 cells/uL (ref 850–3900)
MCH: 31.3 pg (ref 27.0–33.0)
MCHC: 33.3 g/dL (ref 32.0–36.0)
MCV: 94.1 fL (ref 80.0–100.0)
MPV: 11.1 fL (ref 7.5–12.5)
Monocytes Relative: 9.1 %
Neutro Abs: 4171 cells/uL (ref 1500–7800)
Neutrophils Relative %: 43.9 %
Platelets: 335 10*3/uL (ref 140–400)
RBC: 4.25 10*6/uL (ref 3.80–5.10)
RDW: 13 % (ref 11.0–15.0)
Total Lymphocyte: 40.9 %
WBC: 9.5 10*3/uL (ref 3.8–10.8)

## 2020-08-31 ENCOUNTER — Encounter: Payer: Self-pay | Admitting: *Deleted

## 2020-08-31 ENCOUNTER — Encounter: Payer: Self-pay | Admitting: Family Medicine

## 2020-08-31 ENCOUNTER — Other Ambulatory Visit: Payer: Self-pay | Admitting: *Deleted

## 2020-08-31 DIAGNOSIS — G35 Multiple sclerosis: Secondary | ICD-10-CM | POA: Diagnosis not present

## 2020-09-06 NOTE — Progress Notes (Deleted)
BH MD/PA/NP OP Progress Note  09/06/2020 11:23 AM Virginia [name]  MRN:  [id number]  Chief Complaint:  HPI:  - She was seen by her PCP for ataxia/cognitive impairment. Topamax was discontinued due to concern for polypharmacy.  Visit Diagnosis: No diagnosis found.  Past Psychiatric History: Please see initial evaluation for full details. I have reviewed the history. No updates at this time.     Past Medical History:  Past Medical History:  Diagnosis Date  . Anxiety   . Headache   . Insomnia   . Multiple sclerosis (HCC)   . Neuromuscular disorder (HCC)    Phreesia 06/30/2020  . Nystagmus 02/15/2017  . Syncope and collapse    No past surgical history on file.  Family Psychiatric History: Please see initial evaluation for full details. I have reviewed the history. No updates at this time.     Family History:  Family History  Problem Relation Age of Onset  . Emphysema Mother   . Osteoarthritis Mother   . Anxiety disorder Mother   . Other Father   . Suicidality Father   . ALS Maternal Grandmother   . Colon cancer Neg Hx   . Colon polyps Neg Hx   . Esophageal cancer Neg Hx   . Rectal cancer Neg Hx   . Stomach cancer Neg Hx     Social History:  Social History   Socioeconomic History  . Marital status: Married    Spouse name: Not on file  . Number of children: Not on file  . Years of education: Not on file  . Highest education level: Not on file  Occupational History  . Not on file  Tobacco Use  . Smoking status: Current Every Day Smoker    Types: E-cigarettes  . Smokeless tobacco: Never Used  Substance and Sexual Activity  . Alcohol use: Yes    Alcohol/week: 0.0 standard drinks    Comment: rarely  . Drug use: No  . Sexual activity: Not on file  Other Topics Concern  . Not on file  Social History Narrative  . Not on file   Social Determinants of Health   Financial Resource Strain:   . Difficulty of Paying Living Expenses: Not on file  Food Insecurity:    . Worried About Programme researcher, broadcasting/film/video in the Last Year: Not on file  . Ran Out of Food in the Last Year: Not on file  Transportation Needs:   . Lack of Transportation (Medical): Not on file  . Lack of Transportation (Non-Medical): Not on file  Physical Activity:   . Days of Exercise per Week: Not on file  . Minutes of Exercise per Session: Not on file  Stress:   . Feeling of Stress : Not on file  Social Connections:   . Frequency of Communication with Friends and Family: Not on file  . Frequency of Social Gatherings with Friends and Family: Not on file  . Attends Religious Services: Not on file  . Active Member of Clubs or Organizations: Not on file  . Attends Banker Meetings: Not on file  . Marital Status: Not on file    Allergies:  Allergies  Allergen Reactions  . Codeine Other (See Comments)    Hallucinations   . Onion   . Topamax [Topiramate]     Was talking out of her head, walked into walls    Metabolic Disorder Labs: No results found for: HGBA1C, MPG No results found for: PROLACTIN No results found for:  CHOL, TRIG, HDL, CHOLHDL, VLDL, LDLCALC Lab Results  Component Value Date   TSH 1.010 01/01/2018   TSH 1.400 04/25/2017    Therapeutic Level Labs: No results found for: LITHIUM No results found for: VALPROATE No components found for:  CBMZ  Current Medications: Current Outpatient Medications  Medication Sig Dispense Refill  . amitriptyline (ELAVIL) 50 MG tablet TAKE 1 TABLET BY MOUTH EVERY DAY AT BEDTIME AS NEEDED FOR SLEEP 30 tablet 8  . atenolol (TENORMIN) 50 MG tablet TAKE 1 TABLET BY MOUTH EVERY DAY 30 tablet 11  . baclofen (LIORESAL) 10 MG tablet Take one pill 3 to 5 times a day as needed for spasticity 450 tablet 11  . clonazePAM (KLONOPIN) 1 MG tablet TAKE 1 TABLET BY MOUTH TWICE A DAY AS NEEDED FOR ANXIETY 20 tablet 1  . fluticasone (FLONASE) 50 MCG/ACT nasal spray Place 2 sprays into both nostrils daily. 16 g 6  . gabapentin  (NEURONTIN) 600 MG tablet TAKE 1 TABLET (600 MG TOTAL) BY MOUTH 4 (FOUR) TIMES DAILY. 360 tablet 3  . meclizine (ANTIVERT) 25 MG tablet TAKE 1 TABLET (25 MG TOTAL) BY MOUTH 3 (THREE) TIMES DAILY AS NEEDED FOR DIZZINESS. 30 tablet 0  . metroNIDAZOLE (METROCREAM) 0.75 % cream APPLY TO AFFECTED AREA TWICE A DAY 45 g 0  . natalizumab (TYSABRI) 300 MG/15ML injection Inject 300 mg into the vein every 28 (twenty-eight) days.    . pantoprazole (PROTONIX) 40 MG tablet TAKE 1 TABLET BY MOUTH TWICE A DAY 60 tablet 5  . sertraline (ZOLOFT) 100 MG tablet Take 2 tablets (200 mg total) by mouth daily. 180 tablet 0  . sucralfate (CARAFATE) 1 g tablet TAKE 1 TABLET BY MOUTH 4 TIMES DAILY - WITH MEALS AND AT BEDTIME. 120 tablet 1  . SUMAtriptan (IMITREX) 50 MG tablet Take 1 tablet (50 mg total) by mouth every 2 (two) hours as needed for migraine. May repeat in 2 hours if headache persists or recurs. 10 tablet 5   No current facility-administered medications for this visit.     Musculoskeletal: Strength & Muscle Tone: N/A Gait & Station: N/A Patient leans: N/A  Psychiatric Specialty Exam: Review of Systems  There were no vitals taken for this visit.There is no height or weight on file to calculate BMI.  General Appearance: {Appearance:22683}  Eye Contact:  {[facility] EYE CONTACT:22684}  Speech:  Clear and Coherent  Volume:  Normal  Mood:  {[facility] MOOD:22306}  Affect:  {Affect (PAA):22687}  Thought Process:  Coherent  Orientation:  Full (Time, Place, and Person)  Thought Content: Logical   Suicidal Thoughts:  {ST/HT (PAA):22692}  Homicidal Thoughts:  {ST/HT (PAA):22692}  Memory:  Immediate;   Good  Judgement:  {Judgement (PAA):22694}  Insight:  {Insight (PAA):22695}  Psychomotor Activity:  Normal  Concentration:  Concentration: Good and Attention Span: Good  Recall:  Good  Fund of Knowledge: Good  Language: Good  Akathisia:  No  Handed:  Right  AIMS (if indicated): not done  Assets:  Communication  Skills Desire for Improvement  ADL's:  Intact  Cognition: WNL  Sleep:  {[facility] GOOD/FAIR/POOR:22877}   Screenings: PHQ2-9     Office Visit from 07/15/2018 in [facility] Office Visit from 06/10/2018 in [facility] Office Visit from 01/28/2018 in [facility]  PHQ-2 Total Score 0 0 1  PHQ-9 Total Score -- -- 8       Assessment and Plan:  Virginia [name] is a 39 y.o. year old female with  a history of panic disorder, depression,multiple sclerosis,borderline sleep apnea (study in 2019),, who presents for follow up appointment for below.    1. Panic disorder 2. Major depressive disorder with single episode, in partial remission (HCC) She reports worsening in anxiety without significant triggers.  Psychosocial stressors includes MS. Will uptitrate sertraline to optimize treatment for anxiety and depression.  She will greatly benefit from CBT.  She is advised to contact the office if she is interested in this.   3. Insomnia, unspecified type She continues to report initial and middle insomnia, daytime fatigue.  Although some of her symptoms can be attributable to MS, referral was made to rule out sleep apnea.   Plan 1.Increase sertraline 200 mg daily 2. Next appointment:11/18 at 1 PM for 20 mins, video 4. Referral for evaluation of sleep apnea - onclonazepam0.5-1mg  at night as needed for sleep , prescribed by PCP - On melatonin 10 mg at night  - TSH checked this year, normal according to the patient - She is on gabapentin 600 mg QID, baclofen 10 mg TID to five times a day,  Past trials of medication:lexapro,venlafaxine,duloxetine (gained 40 lbs), Trintellix, Trazodone, doxepin  The patient demonstrates the following risk factors for suicide: Chronic risk factors for suicide include:psychiatric disorder ofanxiety. Acute risk factorsfor suicide include: unemployment. Protective factorsfor this patient include: positive  social support, coping skills and hope for the future. Considering these factors, the overall suicide risk at this point appears to below. Patientisappropriate for outpatient follow up.   Neysa Hotter, MD 09/06/2020, 11:23 AM

## 2020-09-10 ENCOUNTER — Other Ambulatory Visit: Payer: Self-pay | Admitting: Family Medicine

## 2020-09-10 DIAGNOSIS — F411 Generalized anxiety disorder: Secondary | ICD-10-CM

## 2020-09-10 NOTE — Telephone Encounter (Signed)
Ok to refill??  Last office visit 08/24/2020.   Last refill 08/12/2020.

## 2020-09-16 ENCOUNTER — Telehealth: Payer: Self-pay | Admitting: Psychiatry

## 2020-09-16 ENCOUNTER — Other Ambulatory Visit: Payer: Self-pay

## 2020-09-16 ENCOUNTER — Telehealth: Payer: BC Managed Care – PPO | Admitting: Psychiatry

## 2020-09-16 ENCOUNTER — Telehealth (HOSPITAL_COMMUNITY): Payer: BC Managed Care – PPO | Admitting: Psychiatry

## 2020-09-16 NOTE — Telephone Encounter (Signed)
Sent link for video visit through Epic. Patient did not sign in. Called the patient  for appointment scheduled today. The patient did not answer the phone. Left voice message to contact the office.  

## 2020-09-25 ENCOUNTER — Other Ambulatory Visit: Payer: Self-pay | Admitting: Family Medicine

## 2020-10-04 ENCOUNTER — Other Ambulatory Visit: Payer: Self-pay | Admitting: *Deleted

## 2020-10-04 MED ORDER — METHYLPREDNISOLONE 4 MG PO TBPK: ORAL_TABLET | ORAL | 0 refills | Status: DC

## 2020-10-06 ENCOUNTER — Other Ambulatory Visit: Payer: Self-pay | Admitting: Family Medicine

## 2020-10-06 DIAGNOSIS — G35 Multiple sclerosis: Secondary | ICD-10-CM | POA: Diagnosis not present

## 2020-10-06 DIAGNOSIS — F411 Generalized anxiety disorder: Secondary | ICD-10-CM

## 2020-10-14 ENCOUNTER — Encounter: Payer: Self-pay | Admitting: Family Medicine

## 2020-10-15 ENCOUNTER — Ambulatory Visit (INDEPENDENT_AMBULATORY_CARE_PROVIDER_SITE_OTHER): Payer: BC Managed Care – PPO | Admitting: Family Medicine

## 2020-10-15 ENCOUNTER — Other Ambulatory Visit: Payer: Self-pay

## 2020-10-15 VITALS — BP 130/88 | HR 97 | Temp 98.0°F | Ht 64.0 in | Wt 216.0 lb

## 2020-10-15 DIAGNOSIS — Z79899 Other long term (current) drug therapy: Secondary | ICD-10-CM | POA: Diagnosis not present

## 2020-10-15 DIAGNOSIS — R412 Retrograde amnesia: Secondary | ICD-10-CM | POA: Diagnosis not present

## 2020-10-15 DIAGNOSIS — F411 Generalized anxiety disorder: Secondary | ICD-10-CM

## 2020-10-15 DIAGNOSIS — R110 Nausea: Secondary | ICD-10-CM

## 2020-10-15 DIAGNOSIS — R519 Headache, unspecified: Secondary | ICD-10-CM | POA: Diagnosis not present

## 2020-10-15 MED ORDER — AIMOVIG 140 MG/ML ~~LOC~~ SOAJ: 140.0000 mg | SUBCUTANEOUS | 5 refills | Status: DC

## 2020-10-15 NOTE — Progress Notes (Signed)
Subjective:    Patient ID: Virginia [name], female    DOB: 28-Jan-1981, 39 y.o.   MRN: [id number] 08/24/20 Patient states that I am "out of it".  She states that she feels lethargic, groggy, confused.  She reports trouble with her short-term memory.  She also reports ataxia.  She reports that she is "bouncing off the walls" when she is walking around her house.  However she states that she is unable to sleep at night and does not want to quit any medication that helps her sleep.  She saw her psychiatrist in September and they increased her Zoloft to 200 mg a day.  Recently she has been having more headaches and her neurologist started her on Topamax approximately 2 weeks ago.  Over the last 2 weeks the Topamax has been uptitrated.  Today the patient appears impaired.  She has delayed reaction time   She is slow to answer questions. She states that she feels "drunk.  Patient has significant polypharmacy including amitriptyline, gabapentin, baclofen, Klonopin, Zoloft, Topamax.  At that time, my plan was: I believe the patient is suffering from ataxia secondary to polypharmacy and drug interactions.  Although she is having more headaches and certainly would benefit from the Topamax at migraine prevention, I feel that the addition of Topamax to the gabapentin coupled with the baclofen and Klonopin is causing her to feel dizzy and sedated and "drunk".  Therefore I recommended discontinuing the Topamax and reassessing later this week to see if her symptoms are improving as the medication is cleared from her system.  She had an MRI of the brain in March that showed no new white matter lesions compared to an MRI from last year.  There is no neurologic deficit on her exam today that would suggest an underlying stroke or space-occupying lesion.  10/15/20 Patient presents today stating "I feel like I am going crazy".  She reports daily headaches for which she is taking Aleve.  She also reports daily nausea for which she  is taking Dramamine.  She states that she falls asleep during the day and also at night.  She will wake up in a different room covered in food.  Apparently she is having sleepwalking episodes where she will start to eat with no recollection of walking to the kitchen or preparing food.  She denies any seizure activities.  She denies any loss of bowel or bladder.  She denies biting her tongue.  She denies any head trauma.  She denies any double vision or blurry vision.  She does report nervelike pain radiating all throughout her body whenever she turns her head abruptly to one side or another.  Past medical history significant for MS for which she is on [place].  She also has polypharmacy which we have discussed numerous times in the past including baclofen, amitriptyline, gabapentin, Klonopin.  She also recently abruptly stopped her Zoloft because it "was not helping".  This occurred approximately 3 weeks ago.  She states that she does not feel like her psychiatrist is listening to her.  She states that all they want to do is increase the dose of the medication but nothing is helping. Past Medical History:  Diagnosis Date   Anxiety    Headache    Insomnia    Multiple sclerosis (HCC)    Neuromuscular disorder (HCC)    Phreesia 06/30/2020   Nystagmus 02/15/2017   Syncope and collapse    No past surgical history on file. Current Outpatient Medications on  File Prior to Visit  Medication Sig Dispense Refill   amitriptyline (ELAVIL) 50 MG tablet TAKE 1 TABLET BY MOUTH EVERY DAY AT BEDTIME AS NEEDED FOR SLEEP 30 tablet 8   atenolol (TENORMIN) 50 MG tablet TAKE 1 TABLET BY MOUTH EVERY DAY 30 tablet 11   baclofen (LIORESAL) 10 MG tablet Take one pill 3 to 5 times a day as needed for spasticity 450 tablet 11   clonazePAM (KLONOPIN) 1 MG tablet TAKE 1 TABLET BY MOUTH TWICE A DAY AS NEEDED FOR ANXIETY 20 tablet 1   fluticasone (FLONASE) 50 MCG/ACT nasal spray Place 2 sprays into both nostrils daily.  16 g 6   gabapentin (NEURONTIN) 600 MG tablet TAKE 1 TABLET (600 MG TOTAL) BY MOUTH 4 (FOUR) TIMES DAILY. 360 tablet 3   meclizine (ANTIVERT) 25 MG tablet TAKE 1 TABLET (25 MG TOTAL) BY MOUTH 3 (THREE) TIMES DAILY AS NEEDED FOR DIZZINESS. 30 tablet 0   methylPREDNISolone (MEDROL DOSEPAK) 4 MG TBPK tablet Take 6 tablets on day 1 and decrease by 1 tablet each day until finished 21 tablet 0   metroNIDAZOLE (METROCREAM) 0.75 % cream APPLY TO AFFECTED AREA TWICE A DAY 45 g 0   natalizumab (TYSABRI) 300 MG/15ML injection Inject 300 mg into the vein every 28 (twenty-eight) days.     pantoprazole (PROTONIX) 40 MG tablet TAKE 1 TABLET BY MOUTH TWICE A DAY 60 tablet 5   sertraline (ZOLOFT) 100 MG tablet Take 2 tablets (200 mg total) by mouth daily. 180 tablet 0   sucralfate (CARAFATE) 1 g tablet TAKE 1 TABLET BY MOUTH 4 TIMES DAILY - WITH MEALS AND AT BEDTIME. 120 tablet 1   SUMAtriptan (IMITREX) 50 MG tablet Take 1 tablet (50 mg total) by mouth every 2 (two) hours as needed for migraine. May repeat in 2 hours if headache persists or recurs. 10 tablet 5   No current facility-administered medications on file prior to visit.   Allergies  Allergen Reactions   Codeine Other (See Comments)    Hallucinations    Onion    Topamax [Topiramate]     Was talking out of her head, walked into walls   Social History   Socioeconomic History   Marital status: Married    Spouse name: Not on file   Number of children: Not on file   Years of education: Not on file   Highest education level: Not on file  Occupational History   Not on file  Tobacco Use   Smoking status: Current Every Day Smoker    Types: E-cigarettes   Smokeless tobacco: Never Used  Substance and Sexual Activity   Alcohol use: Yes    Alcohol/week: 0.0 standard drinks    Comment: rarely   Drug use: No   Sexual activity: Not on file  Other Topics Concern   Not on file  Social History Narrative   Not on file    Social Determinants of Health   Financial Resource Strain: Not on file  Food Insecurity: Not on file  Transportation Needs: Not on file  Physical Activity: Not on file  Stress: Not on file  Social Connections: Not on file  Intimate Partner Violence: Not on file    Review of Systems  Neurological: Positive for headaches.  All other systems reviewed and are negative.      Objective:   Physical Exam Vitals reviewed.  Constitutional:      General: She is not in acute distress.    Appearance: Normal appearance. She  is not ill-appearing, toxic-appearing or diaphoretic.  HENT:     Mouth/Throat:     Mouth: Mucous membranes are moist. No injury.     Tongue: No lesions. Tongue does not deviate from midline.     Tonsils: No tonsillar exudate or tonsillar abscesses.  Cardiovascular:     Rate and Rhythm: Normal rate and regular rhythm.     Pulses: Normal pulses.     Heart sounds: No murmur heard. No friction rub. No gallop.   Pulmonary:     Effort: Pulmonary effort is normal. No respiratory distress.     Breath sounds: No stridor. No wheezing, rhonchi or rales.  Chest:     Chest wall: No tenderness.  Musculoskeletal:     Right lower leg: No edema.     Left lower leg: No edema.  Neurological:     Mental Status: She is alert and oriented to person, place, and time. Mental status is at baseline.     Cranial Nerves: Cranial nerves are intact.     Sensory: Sensation is intact.     Coordination: Coordination is intact.     Gait: Gait is intact.  Psychiatric:        Attention and Perception: Attention and perception normal. She does not perceive auditory or visual hallucinations.        Mood and Affect: Affect is flat.        Speech: Speech is delayed.        Behavior: Behavior is slowed.        Thought Content: Thought content normal.        Cognition and Memory: She exhibits impaired recent memory.        Judgment: Judgment normal.           Assessment & Plan:   Amnesia (retrograde)  Polypharmacy  GAD (generalized anxiety disorder)  Chronic daily headache  Nausea  Spent about 30 minutes with the patient today.  I explained to her that I believe there are numerous factors combining to cause her symptoms.  I believe that she does have MS.  I believe that this causes her substantial pain and neurologic impairment.  I also believe that she has chronic daily headaches which are debilitating for her.  I believe that she is very anxious about her health care and the decline in her function.  I believe that she is on a lot of medication which could have drug interactions and unintended side effects.  I believe that some combination of these 3 is likely causing the dissociative amnesia.  I believe that to improve her condition we have to address each of these 3 issues as best we can.  We are going to focus on trying to calm the daily headaches.  I recommended starting Aimovig 140 mg subcu monthly and then reassessing in 3 months.  Hopefully the frequency and the severity of the headaches will improve with minimal if any side effects.  Of also recommended that she stop the daily Aleve as I believe this is likely contributing to the nausea as are the daily migraines.  I believe the polypharmacy could be causing some of the dissociative amnesia.  She is on amitriptyline along with Dramamine which are both anticholinergic medications obviously.  She is taking gabapentin, baclofen, and Klonopin.  I believe there may be an element of somatizations disorder due to uncontrolled anxiety.  Therefore I would like to improve the headaches, gradually try to wean her down on her medication, and hopefully  better address underlying anxiety to improve her condition.  I answered her questions as best I could.

## 2020-11-02 ENCOUNTER — Other Ambulatory Visit: Payer: Self-pay | Admitting: Family Medicine

## 2020-11-02 DIAGNOSIS — F411 Generalized anxiety disorder: Secondary | ICD-10-CM

## 2020-11-03 NOTE — Telephone Encounter (Signed)
Ok to refill??  Last office visit 10/15/2020.  Last refill 10/07/2020.

## 2020-11-05 ENCOUNTER — Encounter: Payer: Self-pay | Admitting: Family Medicine

## 2020-11-05 ENCOUNTER — Telehealth: Payer: Self-pay

## 2020-11-05 ENCOUNTER — Other Ambulatory Visit: Payer: Self-pay | Admitting: Psychiatry

## 2020-11-05 ENCOUNTER — Ambulatory Visit (INDEPENDENT_AMBULATORY_CARE_PROVIDER_SITE_OTHER): Payer: BC Managed Care – PPO | Admitting: Family Medicine

## 2020-11-05 ENCOUNTER — Other Ambulatory Visit: Payer: Self-pay

## 2020-11-05 VITALS — BP 124/80 | HR 87 | Temp 97.8°F | Ht 64.0 in | Wt 220.0 lb

## 2020-11-05 DIAGNOSIS — M722 Plantar fascial fibromatosis: Secondary | ICD-10-CM

## 2020-11-05 DIAGNOSIS — G4700 Insomnia, unspecified: Secondary | ICD-10-CM | POA: Diagnosis not present

## 2020-11-05 MED ORDER — TEMAZEPAM 30 MG PO CAPS: 30.0000 mg | ORAL_CAPSULE | Freq: Every evening | ORAL | 0 refills | Status: DC | PRN

## 2020-11-05 NOTE — Telephone Encounter (Signed)
Medication management - Confidential appropriate message left for pt of need to set up a new appt with Dr. Vanetta Shawl if she would like any refills as none would be provided until appointment scheduled.  Tried other home number and no answer.

## 2020-11-05 NOTE — Progress Notes (Signed)
Subjective:    Patient ID: Virginia [name], female    DOB: [birth year], 40 y.o.   MRN: [id number] 08/24/20 Patient states that I am "out of it".  She states that she feels lethargic, groggy, confused.  She reports trouble with her short-term memory.  She also reports ataxia.  She reports that she is "bouncing off the walls" when she is walking around her house.  However she states that she is unable to sleep at night and does not want to quit any medication that helps her sleep.  She saw her psychiatrist in September and they increased her Zoloft to 200 mg a day.  Recently she has been having more headaches and her neurologist started her on Topamax approximately 2 weeks ago.  Over the last 2 weeks the Topamax has been uptitrated.  Today the patient appears impaired.  She has delayed reaction time   She is slow to answer questions. She states that she feels "drunk.  Patient has significant polypharmacy including amitriptyline, gabapentin, baclofen, Klonopin, Zoloft, Topamax.  At that time, my plan was: I believe the patient is suffering from ataxia secondary to polypharmacy and drug interactions.  Although she is having more headaches and certainly would benefit from the Topamax at migraine prevention, I feel that the addition of Topamax to the gabapentin coupled with the baclofen and Klonopin is causing her to feel dizzy and sedated and "drunk".  Therefore I recommended discontinuing the Topamax and reassessing later this week to see if her symptoms are improving as the medication is cleared from her system.  She had an MRI of the brain in March that showed no new white matter lesions compared to an MRI from last year.  There is no neurologic deficit on her exam today that would suggest an underlying stroke or space-occupying lesion.  10/15/20 Patient presents today stating "I feel like I am going crazy".  She reports daily headaches for which she is taking Aleve.  She also reports daily nausea for which she  is taking Dramamine.  She states that she falls asleep during the day and also at night.  She will wake up in a different room covered in food.  Apparently she is having sleepwalking episodes where she will start to eat with no recollection of walking to the kitchen or preparing food.  She denies any seizure activities.  She denies any loss of bowel or bladder.  She denies biting her tongue.  She denies any head trauma.  She denies any double vision or blurry vision.  She does report nervelike pain radiating all throughout her body whenever she turns her head abruptly to one side or another.  Past medical history significant for MS for which she is on [place].  She also has polypharmacy which we have discussed numerous times in the past including baclofen, amitriptyline, gabapentin, Klonopin.  She also recently abruptly stopped her Zoloft because it "was not helping".  This occurred approximately 3 weeks ago.  She states that she does not feel like her psychiatrist is listening to her.  She states that all they want to do is increase the dose of the medication but nothing is helping.  At that time, my plan was: Spent about 30 minutes with the patient today.  I explained to her that I believe there are numerous factors combining to cause her symptoms.  I believe that she does have MS.  I believe that this causes her substantial pain and neurologic impairment.  I also believe  that she has chronic daily headaches which are debilitating for her.  I believe that she is very anxious about her health care and the decline in her function.  I believe that she is on a lot of medication which could have drug interactions and unintended side effects.  I believe that some combination of these 3 is likely causing the dissociative amnesia.  I believe that to improve her condition we have to address each of these 3 issues as best we can.  We are going to focus on trying to calm the daily headaches.  I recommended starting Aimovig  140 mg subcu monthly and then reassessing in 3 months.  Hopefully the frequency and the severity of the headaches will improve with minimal if any side effects.  Of also recommended that she stop the daily Aleve as I believe this is likely contributing to the nausea as are the daily migraines.  I believe the polypharmacy could be causing some of the dissociative amnesia.  She is on amitriptyline along with Dramamine which are both anticholinergic medications obviously.  She is taking gabapentin, baclofen, and Klonopin.  I believe there may be an element of somatizations disorder due to uncontrolled anxiety.  Therefore I would like to improve the headaches, gradually try to wean her down on her medication, and hopefully better address underlying anxiety to improve her condition.  I answered her questions as best I could.  11/05/20 Patient states that her headaches have improved on Aimovig which I am happy to hear.  She states that the dissociative amnesia seems to be better.  However she states that she is having a very difficult time sleeping.  She states that she is only slept approximately 8 hours over the last week.  She simply lies in bed tossing and turning.  She is not getting any exercise during the day because she "hurts so bad".  She denies any suicidal ideation.  She denies any symptoms of mania.  She does not demonstrate any tangential speech or flight of ideas.  She does report pain in her left foot on the calcaneus at the insertion of the plantar fascia.  She states it hurts like a stabbing pain whenever she gets up from sitting down or after lying in bed.  She is tender near the insertion of the plantar fascia.  X-ray obtained last year was negative. Past Medical History:  Diagnosis Date  . Anxiety   . Headache   . Insomnia   . Multiple sclerosis (HCC)   . Neuromuscular disorder (HCC)    Phreesia 06/30/2020  . Nystagmus 02/15/2017  . Syncope and collapse    No past surgical history on  file. Current Outpatient Medications on File Prior to Visit  Medication Sig Dispense Refill  . amitriptyline (ELAVIL) 50 MG tablet TAKE 1 TABLET BY MOUTH EVERY DAY AT BEDTIME AS NEEDED FOR SLEEP 30 tablet 8  . atenolol (TENORMIN) 50 MG tablet TAKE 1 TABLET BY MOUTH EVERY DAY 30 tablet 11  . baclofen (LIORESAL) 10 MG tablet Take one pill 3 to 5 times a day as needed for spasticity 450 tablet 11  . clonazePAM (KLONOPIN) 1 MG tablet TAKE 1 TABLET BY MOUTH TWICE A DAY AS NEEDED FOR ANXIETY 20 tablet 1  . Erenumab-aooe (AIMOVIG) 140 MG/ML SOAJ Inject 140 mg into the skin every 30 (thirty) days. 1.12 mL 5  . fluticasone (FLONASE) 50 MCG/ACT nasal spray Place 2 sprays into both nostrils daily. 16 g 6  . gabapentin (NEURONTIN)  600 MG tablet TAKE 1 TABLET (600 MG TOTAL) BY MOUTH 4 (FOUR) TIMES DAILY. 360 tablet 3  . meclizine (ANTIVERT) 25 MG tablet TAKE 1 TABLET (25 MG TOTAL) BY MOUTH 3 (THREE) TIMES DAILY AS NEEDED FOR DIZZINESS. 30 tablet 0  . metroNIDAZOLE (METROCREAM) 0.75 % cream APPLY TO AFFECTED AREA TWICE A DAY 45 g 0  . natalizumab (TYSABRI) 300 MG/15ML injection Inject 300 mg into the vein every 28 (twenty-eight) days.    . pantoprazole (PROTONIX) 40 MG tablet TAKE 1 TABLET BY MOUTH TWICE A DAY 60 tablet 5  . sertraline (ZOLOFT) 100 MG tablet Take 2 tablets (200 mg total) by mouth daily. (Patient not taking: Reported on 10/15/2020) 180 tablet 0  . sucralfate (CARAFATE) 1 g tablet TAKE 1 TABLET BY MOUTH 4 TIMES DAILY - WITH MEALS AND AT BEDTIME. 120 tablet 1  . SUMAtriptan (IMITREX) 50 MG tablet Take 1 tablet (50 mg total) by mouth every 2 (two) hours as needed for migraine. May repeat in 2 hours if headache persists or recurs. 10 tablet 5   No current facility-administered medications on file prior to visit.   Allergies  Allergen Reactions  . Codeine Other (See Comments)    Hallucinations   . Onion   . Topamax [Topiramate]     Was talking out of her head, walked into walls   Social  History   Socioeconomic History  . Marital status: Married    Spouse name: Not on file  . Number of children: Not on file  . Years of education: Not on file  . Highest education level: Not on file  Occupational History  . Not on file  Tobacco Use  . Smoking status: Current Every Day Smoker    Types: E-cigarettes  . Smokeless tobacco: Never Used  Substance and Sexual Activity  . Alcohol use: Yes    Alcohol/week: 0.0 standard drinks    Comment: rarely  . Drug use: No  . Sexual activity: Not on file  Other Topics Concern  . Not on file  Social History Narrative  . Not on file   Social Determinants of Health   Financial Resource Strain: Not on file  Food Insecurity: Not on file  Transportation Needs: Not on file  Physical Activity: Not on file  Stress: Not on file  Social Connections: Not on file  Intimate Partner Violence: Not on file    Review of Systems  Neurological: Positive for headaches.  All other systems reviewed and are negative.      Objective:   Physical Exam Vitals reviewed.  Constitutional:      General: She is not in acute distress.    Appearance: Normal appearance. She is not ill-appearing, toxic-appearing or diaphoretic.  HENT:     Mouth/Throat:     Mouth: Mucous membranes are moist. No injury.     Tongue: No lesions. Tongue does not deviate from midline.     Tonsils: No tonsillar exudate or tonsillar abscesses.  Cardiovascular:     Rate and Rhythm: Normal rate and regular rhythm.     Pulses: Normal pulses.     Heart sounds: No murmur heard. No friction rub. No gallop.   Pulmonary:     Effort: Pulmonary effort is normal. No respiratory distress.     Breath sounds: No stridor. No wheezing, rhonchi or rales.  Chest:     Chest wall: No tenderness.  Musculoskeletal:     Right lower leg: No edema.     Left lower  leg: No edema.       Feet:  Neurological:     Mental Status: She is alert and oriented to person, place, and time. Mental status  is at baseline.     Cranial Nerves: Cranial nerves are intact.     Sensory: Sensation is intact.     Coordination: Coordination is intact.     Gait: Gait is intact.  Psychiatric:        Attention and Perception: Attention and perception normal. She does not perceive auditory or visual hallucinations.        Mood and Affect: Affect is flat.        Speech: Speech is delayed.        Behavior: Behavior is slowed.        Thought Content: Thought content normal.        Cognition and Memory: She exhibits impaired recent memory.        Judgment: Judgment normal.           Assessment & Plan:  Insomnia, unspecified type  Plantar fasciitis, left  Recommended that she wear a night splint during the day to help stretch her plantar fascia.  Also gave her information on different stretches she can perform during the day to help stretch her plantar fascia.  If not improving she could return for cortisone injection for plantar fasciitis.  I believe that she is becoming resistant to the Klonopin over time due to habituation.  Discontinue Klonopin and replace with temazepam 30 mg p.o. nightly.

## 2020-11-05 NOTE — Telephone Encounter (Signed)
I will not order any refill at this time base on the PCP record stating that the patient abruptly stopped this medication. Please contact the patient for follow up appointment if she is interested.

## 2020-11-05 NOTE — Telephone Encounter (Signed)
Medication refill request - Fax received from [facility] for a refill of pt's Sertraline, last provided for 90 days 07/15/20, pt was canceled by provider 09/16/20 and no current appt scheduled.

## 2020-11-11 ENCOUNTER — Ambulatory Visit: Payer: BC Managed Care – PPO | Attending: Internal Medicine

## 2020-11-11 DIAGNOSIS — Z23 Encounter for immunization: Secondary | ICD-10-CM

## 2020-11-11 NOTE — Progress Notes (Signed)
   Covid-19 Vaccination Clinic  Name:  Virginia [name]    MRN: [id number] DOB: [birth year]  11/11/2020  Ms. Else was observed post Covid-19 immunization for 15 minutes without incident. She was provided with Vaccine Information Sheet and instruction to access the V-Safe system.   [name] was instructed to call 911 with any severe reactions post vaccine: Marland Kitchen Difficulty breathing  . Swelling of face and throat  . A fast heartbeat  . A bad rash all over body  . Dizziness and weakness   Immunizations Administered    Name Date Dose VIS Date Route   Pfizer COVID-19 Vaccine 11/11/2020  2:37 PM 0.3 mL 08/18/2020 Intramuscular   Manufacturer: [employer], [employer]   Lot: [id number]   NDC: [address]-0

## 2020-11-24 DIAGNOSIS — G35 Multiple sclerosis: Secondary | ICD-10-CM | POA: Diagnosis not present

## 2020-11-29 ENCOUNTER — Other Ambulatory Visit: Payer: Self-pay

## 2020-11-29 ENCOUNTER — Encounter: Payer: Self-pay | Admitting: Nurse Practitioner

## 2020-11-29 ENCOUNTER — Telehealth (INDEPENDENT_AMBULATORY_CARE_PROVIDER_SITE_OTHER): Payer: BC Managed Care – PPO | Admitting: Nurse Practitioner

## 2020-11-29 ENCOUNTER — Other Ambulatory Visit: Payer: Self-pay | Admitting: Family Medicine

## 2020-11-29 DIAGNOSIS — F411 Generalized anxiety disorder: Secondary | ICD-10-CM

## 2020-11-29 DIAGNOSIS — J069 Acute upper respiratory infection, unspecified: Secondary | ICD-10-CM

## 2020-11-29 MED ORDER — BENZONATATE 100 MG PO CAPS: 100.0000 mg | ORAL_CAPSULE | Freq: Two times a day (BID) | ORAL | 0 refills | Status: DC | PRN

## 2020-11-29 MED ORDER — SALINE SPRAY 0.65 % NA SOLN: 1.0000 | NASAL | 0 refills | Status: DC | PRN

## 2020-11-29 NOTE — Patient Instructions (Signed)

## 2020-11-29 NOTE — Telephone Encounter (Signed)
Ok to refill??  Last office visit 11/05/2020.  Last refill 11/04/2020.

## 2020-11-29 NOTE — Progress Notes (Signed)
Subjective:    Patient ID: Virginia [name], female    DOB: 01/17/1981, 40 y.o.   MRN: [id number]  HPI: Virginia [name] is a 40 y.o. female presenting virtually for upper respiratory tract infection symptoms.  Chief Complaint  Patient presents with  . URI   UPPER RESPIRATORY TRACT INFECTION Vaccinated status: Has received 1 Pfizer vaccine;  Onset: 2-3 days ago Worst symptom: cough Fever: no Cough: yes Shortness of breath: yes Wheezing: yes Chest pain: no Chest tightness: no Chest congestion: no Nasal congestion: no Runny nose: no Post nasal drip: no Sneezing: yes Sore throat: yes Swollen glands: no Sinus pressure: yes; around nose Headache: yes Face pain: no Toothache: no Ear pain: yes; bilateral  Ear pressure: no  Eyes red/itching:no Eye drainage/crusting: no  Nausea: no  Vomiting: no Diarrhea: no  Change in appetite: no  Loss of taste/smell: no  Rash: no Fatigue: no Sick contacts: husband is sick and was exposed to COVID Strep contacts: no  Context: worse Recurrent sinusitis: no Treatments attempted: Delsym, aleve  Relief with OTC medications: somewhat  Allergies  Allergen Reactions  . Codeine Other (See Comments)    Hallucinations   . Onion   . Topamax [Topiramate]     Was talking out of her head, walked into walls    Outpatient Encounter Medications as of 11/29/2020  Medication Sig Note  . benzonatate (TESSALON) 100 MG capsule Take 1 capsule (100 mg total) by mouth 2 (two) times daily as needed for cough.   . sodium chloride (OCEAN) 0.65 % SOLN nasal spray Place 1 spray into both nostrils as needed for congestion.   Marland Kitchen amitriptyline (ELAVIL) 50 MG tablet TAKE 1 TABLET BY MOUTH EVERY DAY AT BEDTIME AS NEEDED FOR SLEEP   . atenolol (TENORMIN) 50 MG tablet TAKE 1 TABLET BY MOUTH EVERY DAY   . baclofen (LIORESAL) 10 MG tablet Take one pill 3 to 5 times a day as needed for spasticity   . clonazePAM (KLONOPIN) 1 MG tablet TAKE 1 TABLET BY MOUTH TWICE A DAY  AS NEEDED FOR ANXIETY   . Erenumab-aooe (AIMOVIG) 140 MG/ML SOAJ Inject 140 mg into the skin every 30 (thirty) days.   Marland Kitchen gabapentin (NEURONTIN) 600 MG tablet TAKE 1 TABLET (600 MG TOTAL) BY MOUTH 4 (FOUR) TIMES DAILY.   . meclizine (ANTIVERT) 25 MG tablet TAKE 1 TABLET (25 MG TOTAL) BY MOUTH 3 (THREE) TIMES DAILY AS NEEDED FOR DIZZINESS.   Marland Kitchen metroNIDAZOLE (METROCREAM) 0.75 % cream APPLY TO AFFECTED AREA TWICE A DAY   . natalizumab (TYSABRI) 300 MG/15ML injection Inject 300 mg into the vein every 28 (twenty-eight) days. 06/28/2020: JCV ab drawn on 06/24/20 negative, index: 0.15  . pantoprazole (PROTONIX) 40 MG tablet TAKE 1 TABLET BY MOUTH TWICE A DAY   . sucralfate (CARAFATE) 1 g tablet TAKE 1 TABLET BY MOUTH 4 TIMES DAILY - WITH MEALS AND AT BEDTIME.   . SUMAtriptan (IMITREX) 50 MG tablet Take 1 tablet (50 mg total) by mouth every 2 (two) hours as needed for migraine. May repeat in 2 hours if headache persists or recurs.   . temazepam (RESTORIL) 30 MG capsule Take 1 capsule (30 mg total) by mouth at bedtime as needed for sleep (do not take with klonopin).    No facility-administered encounter medications on file as of 11/29/2020.    Patient Active Problem List   Diagnosis Date Noted  . Vertigo of central origin 06/24/2020  . Numbness 06/24/2020  . Panic disorder 11/11/2019  .  Current mild episode of major depressive disorder without prior episode (HCC) 11/11/2019  . Snoring 06/20/2018  . Excessive daytime sleepiness 06/20/2018  . Neck pain 01/01/2018  . Palpitations 01/01/2018  . Myalgia 09/03/2017  . Low back pain 09/03/2017  . Right leg pain 07/26/2017  . Bilateral sciatica 07/12/2017  . Nystagmus 02/15/2017  . Spell of altered consciousness 07/19/2016  . Ataxic gait 05/31/2016  . Other fatigue 05/31/2016  . Dysesthesia 05/31/2016  . High risk medication use 03/31/2016  . Insomnia 03/31/2016  . Dizziness   . Syncope   . Multiple sclerosis (HCC) 03/21/2016  . MS (multiple  sclerosis) (HCC)   . Vertigo   . White matter abnormality on MRI of brain 03/20/2016  . Faintness     Past Medical History:  Diagnosis Date  . Anxiety   . Headache   . Insomnia   . Multiple sclerosis (HCC)   . Neuromuscular disorder (HCC)    Phreesia 06/30/2020  . Nystagmus 02/15/2017  . Syncope and collapse     Relevant past medical, surgical, family and social history reviewed and updated as indicated. Interim medical history since our last visit reviewed.  Review of Systems Per HPI unless specifically indicated above     Objective:    LMP 11/18/2020   Wt Readings from Last 3 Encounters:  11/05/20 220 lb (99.8 kg)  10/15/20 216 lb (98 kg)  08/24/20 202 lb (91.6 kg)    Physical Exam Physical examination unable to be performed due to lack of equipment.  Patient talking in complete sentences during telemedicine visit with occasional dry cough.     Assessment & Plan:  1. Upper respiratory tract infection, unspecified type Acute, ongoing x 4 days.  Encouraged patient to obtain COVID-19 testing.  Reassured patient that symptoms and exam findings are most consistent with a viral upper respiratory infection and explained lack of efficacy of antibiotics against viruses.  Discussed expected course and features suggestive of secondary bacterial infection.  Continue supportive care. Increase fluid intake with water or electrolyte solution like pedialyte. Encouraged acetaminophen as needed for fever/pain. Encouraged salt water gargling, chloraseptic spray and throat lozenges. Encouraged OTC guaifenesin if cough thickens. Encouraged saline sinus flushes and/or neti with humidified air. Start tessalon perles for dry cough and ocean nasal spray for nasal irritation.  With any sudden onset new chest pain, dizziness, sweating, or shortness of breath, go to ED.  If symptoms persist for longer than 2 weeks, return to clinic for re-evaluation.  - benzonatate (TESSALON) 100 MG capsule; Take 1  capsule (100 mg total) by mouth 2 (two) times daily as needed for cough.  Dispense: 20 capsule; Refill: 0 - sodium chloride (OCEAN) 0.65 % SOLN nasal spray; Place 1 spray into both nostrils as needed for congestion.  Dispense: 88 mL; Refill: 0 - SARS-COV-2 RNA,(COVID-19) QUAL NAAT    Follow up plan: Return if symptoms worsen or fail to improve.

## 2020-12-01 LAB — SARS-COV-2 RNA,(COVID-19) QUALITATIVE NAAT: SARS CoV2 RNA: NOT DETECTED

## 2020-12-02 ENCOUNTER — Ambulatory Visit: Payer: Self-pay

## 2020-12-06 ENCOUNTER — Encounter: Payer: Self-pay | Admitting: Nurse Practitioner

## 2020-12-07 ENCOUNTER — Encounter: Payer: Self-pay | Admitting: Nurse Practitioner

## 2020-12-07 ENCOUNTER — Other Ambulatory Visit: Payer: Self-pay

## 2020-12-07 ENCOUNTER — Telehealth (INDEPENDENT_AMBULATORY_CARE_PROVIDER_SITE_OTHER): Payer: BC Managed Care – PPO | Admitting: Nurse Practitioner

## 2020-12-07 NOTE — Progress Notes (Signed)
CMA called several times without being able to get in touch with patient.

## 2020-12-08 ENCOUNTER — Telehealth (INDEPENDENT_AMBULATORY_CARE_PROVIDER_SITE_OTHER): Payer: BC Managed Care – PPO | Admitting: Nurse Practitioner

## 2020-12-08 ENCOUNTER — Other Ambulatory Visit: Payer: Self-pay

## 2020-12-08 DIAGNOSIS — J069 Acute upper respiratory infection, unspecified: Secondary | ICD-10-CM | POA: Diagnosis not present

## 2020-12-08 DIAGNOSIS — R059 Cough, unspecified: Secondary | ICD-10-CM | POA: Diagnosis not present

## 2020-12-08 MED ORDER — BENZONATATE 100 MG PO CAPS: 100.0000 mg | ORAL_CAPSULE | Freq: Two times a day (BID) | ORAL | 0 refills | Status: DC | PRN

## 2020-12-08 MED ORDER — ALBUTEROL SULFATE HFA 108 (90 BASE) MCG/ACT IN AERS: 2.0000 | INHALATION_SPRAY | Freq: Four times a day (QID) | RESPIRATORY_TRACT | 0 refills | Status: DC | PRN

## 2020-12-08 MED ORDER — PREDNISONE 10 MG PO TABS: 10.0000 mg | ORAL_TABLET | Freq: Every day | ORAL | 0 refills | Status: DC

## 2020-12-08 NOTE — Progress Notes (Signed)
Subjective:    Patient ID: Virginia [name], female    DOB: 07-Dec-1980, 40 y.o.   MRN: [id number]  HPI: Virginia [name] is a 40 y.o. female presenting for ongoing upper respiratory symptoms.  Chief Complaint  Patient presents with  . Covid Positive    Prefers phone call Feels terrible, sore throat, cough, congested, fatigue. Having burning in the ears, hot to touch. Taking the rx given.    UPPER RESPIRATORY TRACT INFECTION Feels "terrible".  Thinks symptoms are about the same.  Tested negative for COVID-19 in our office last week and then tested positive later that week at [facility]. Onset: 12 days Worst symptom: cough and shortness of breath Fever: unknown; face and ears get red and hot to touch Cough: yes; dry cough Shortness of breath: yes Wheezing: no Chest pain: no Chest tightness: yes Chest congestion: yes Nasal congestion: yes Runny nose: no Post nasal drip: no Sneezing: no Sore throat: yes; thinks it is improving Swollen glands: no Sinus pressure: no Headache: no Face pain: no Toothache: no Ear pain: no  Ear pressure: no   Eyes red/itching:yes Eye drainage/crusting: no  Nausea: no  Vomiting: no Diarrhea: no  Change in appetite: yes; decreased  Loss of taste/smell: everything tastes "bad"  Rash: no Fatigue: yes Sick contacts: no Strep contacts: no  Context: stable Recurrent sinusitis: no Treatments attempted: Ocean nasal spray, Tessalon Relief with OTC medications: no  Allergies  Allergen Reactions  . Codeine Other (See Comments)    Hallucinations   . Onion   . Topamax [Topiramate]     Was talking out of her head, walked into walls    Outpatient Encounter Medications as of 12/08/2020  Medication Sig Note  . albuterol (VENTOLIN HFA) 108 (90 Base) MCG/ACT inhaler Inhale 2 puffs into the lungs every 6 (six) hours as needed for wheezing or shortness of breath.   Marland Kitchen amitriptyline (ELAVIL) 50 MG tablet TAKE 1 TABLET BY MOUTH EVERY DAY AT BEDTIME AS NEEDED  FOR SLEEP   . atenolol (TENORMIN) 50 MG tablet TAKE 1 TABLET BY MOUTH EVERY DAY   . baclofen (LIORESAL) 10 MG tablet Take one pill 3 to 5 times a day as needed for spasticity   . clonazePAM (KLONOPIN) 1 MG tablet TAKE 1 TABLET BY MOUTH TWICE A DAY AS NEEDED FOR ANXIETY   . Erenumab-aooe (AIMOVIG) 140 MG/ML SOAJ Inject 140 mg into the skin every 30 (thirty) days.   Marland Kitchen gabapentin (NEURONTIN) 600 MG tablet TAKE 1 TABLET (600 MG TOTAL) BY MOUTH 4 (FOUR) TIMES DAILY.   . meclizine (ANTIVERT) 25 MG tablet TAKE 1 TABLET (25 MG TOTAL) BY MOUTH 3 (THREE) TIMES DAILY AS NEEDED FOR DIZZINESS.   Marland Kitchen metroNIDAZOLE (METROCREAM) 0.75 % cream APPLY TO AFFECTED AREA TWICE A DAY   . natalizumab (TYSABRI) 300 MG/15ML injection Inject 300 mg into the vein every 28 (twenty-eight) days. 06/28/2020: JCV ab drawn on 06/24/20 negative, index: 0.15  . pantoprazole (PROTONIX) 40 MG tablet TAKE 1 TABLET BY MOUTH TWICE A DAY   . predniSONE (DELTASONE) 10 MG tablet Take 1 tablet (10 mg total) by mouth daily with breakfast. Take 40mg  on days 1-2. Take 30mg  on days 3-4. Take 20mg  on days 5-6. Take 10mg  on days 7-8. Take 5mg  on days 9-10, then stop.   . sodium chloride (OCEAN) 0.65 % SOLN nasal spray Place 1 spray into both nostrils as needed for congestion.   . sucralfate (CARAFATE) 1 g tablet TAKE 1 TABLET BY MOUTH 4 TIMES  DAILY - WITH MEALS AND AT BEDTIME.   . SUMAtriptan (IMITREX) 50 MG tablet Take 1 tablet (50 mg total) by mouth every 2 (two) hours as needed for migraine. May repeat in 2 hours if headache persists or recurs.   . temazepam (RESTORIL) 30 MG capsule Take 1 capsule (30 mg total) by mouth at bedtime as needed for sleep (do not take with klonopin).   . [DISCONTINUED] benzonatate (TESSALON) 100 MG capsule Take 1 capsule (100 mg total) by mouth 2 (two) times daily as needed for cough.   . benzonatate (TESSALON) 100 MG capsule Take 1 capsule (100 mg total) by mouth 2 (two) times daily as needed for cough.    No  facility-administered encounter medications on file as of 12/08/2020.    Patient Active Problem List   Diagnosis Date Noted  . Vertigo of central origin 06/24/2020  . Numbness 06/24/2020  . Panic disorder 11/11/2019  . Current mild episode of major depressive disorder without prior episode (HCC) 11/11/2019  . Snoring 06/20/2018  . Excessive daytime sleepiness 06/20/2018  . Neck pain 01/01/2018  . Palpitations 01/01/2018  . Myalgia 09/03/2017  . Low back pain 09/03/2017  . Right leg pain 07/26/2017  . Bilateral sciatica 07/12/2017  . Nystagmus 02/15/2017  . Spell of altered consciousness 07/19/2016  . Ataxic gait 05/31/2016  . Other fatigue 05/31/2016  . Dysesthesia 05/31/2016  . High risk medication use 03/31/2016  . Insomnia 03/31/2016  . Dizziness   . Syncope   . Multiple sclerosis (HCC) 03/21/2016  . MS (multiple sclerosis) (HCC)   . Vertigo   . White matter abnormality on MRI of brain 03/20/2016  . Faintness    Past Medical History:  Diagnosis Date  . Anxiety   . Headache   . Insomnia   . Multiple sclerosis (HCC)   . Neuromuscular disorder (HCC)    [employer]reesia 06/30/2020  . Nystagmus 02/15/2017  . Syncope and collapse    Relevant past medical, surgical, family and social history reviewed and updated as indicated. Interim medical history since our last visit reviewed.  Review of Systems Per HPI unless specifically indicated above     Objective:    LMP 11/18/2020   Wt Readings from Last 3 Encounters:  11/05/20 220 lb (99.8 kg)  10/15/20 216 lb (98 kg)  08/24/20 202 lb (91.6 kg)    Physical Exam Physical examination unable to be performed due to lack of equipment.   Results for orders placed or performed in visit on 11/29/20  SARS-COV-2 RNA,(COVID-19) QUAL NAAT   Specimen: Respiratory  Result Value Ref Range   SARS CoV2 RNA Not Detected Not Detect      Assessment & Plan:  1. Cough Acute, ongoing x weeks.  Due to length of symptoms, no longer in window  for outpatient COVID-19 therapy.  Discussed that post-viral cough and symptoms may last for up to 6 weeks but should gradually improve.  Question if patient may be developing reactive airway with COVID infection.  Will start on prednisone taper and albuterol inhaler for rescue purposes.  Follow up in 1 week; with no improvement by end of week, patient to let us know and we can refer to post-covid care clinic.  With any sudden onset new chest pain, dizziness, sweating, or shortness of breath, go to ED.  - predniSONE (DELTASONE) 10 MG tablet; Take 1 tablet (10 mg total) by mouth daily with breakfast. Take 40mg  on days 1-2. Take 30mg  on days 3-4. Take 20mg  on days  5-6. Take 10mg  on days 7-8. Take 5mg  on days 9-10, then stop.  Dispense: 21 tablet; Refill: 0 - albuterol (VENTOLIN HFA) 108 (90 Base) MCG/ACT inhaler; Inhale 2 puffs into the lungs every 6 (six) hours as needed for wheezing or shortness of breath.  Dispense: 8 g; Refill: 0   Follow up plan: Return in about 1 week (around 12/15/2020) for cough/covid symptoms f/u.   This visit was completed via telephone due to the restrictions of the COVID-19 pandemic. All issues as above were discussed and addressed but no physical exam was performed. If it was felt that the patient should be evaluated in the office, they were directed there. The patient verbally consented to this visit. Patient was unable to complete an audio/visual visit due to Lack of internet. . Location of the patient: home . Location of the provider: work . Those involved with this call:  . Provider: , DNP, FNP-C . CMA: 12/17/2020, CMA . Front Desk/Registration: Cathlean Marseilles  . Time spent on call: 12 minutes on the phone discussing health concerns. 15 minutes total spent in review of patient's record and preparation of their chart.  I verified patient identity using two factors (patient name and date of birth). Patient consents verbally to being seen via telemedicine  visit today.

## 2020-12-08 NOTE — Patient Instructions (Signed)
F/u virtual in 1 week

## 2020-12-11 ENCOUNTER — Other Ambulatory Visit: Payer: Self-pay | Admitting: Family Medicine

## 2020-12-18 ENCOUNTER — Other Ambulatory Visit: Payer: Self-pay | Admitting: Family Medicine

## 2020-12-23 ENCOUNTER — Ambulatory Visit: Payer: BC Managed Care – PPO | Attending: Internal Medicine

## 2020-12-23 DIAGNOSIS — Z23 Encounter for immunization: Secondary | ICD-10-CM

## 2020-12-23 NOTE — Progress Notes (Signed)
   Covid-19 Vaccination Clinic  Name:  Virginia [name]    MRN: [id number] DOB: Apr 26, 1981  12/23/2020  [name] was observed post Covid-19 immunization for 15 minutes without incident. She was provided with Vaccine Information Sheet and instruction to access the V-Safe system.   [name] was instructed to call 911 with any severe reactions post vaccine: Marland Kitchen Difficulty breathing  . Swelling of face and throat  . A fast heartbeat  . A bad rash all over body  . Dizziness and weakness   Immunizations Administered    Name Date Dose VIS Date Route   PFIZER Comrnaty(Gray TOP) Covid-19 Vaccine 12/23/2020  3:52 PM 0.3 mL 10/07/2020 Intramuscular   Manufacturer: [employer], [employer]   Lot: [id number]   NDC: [id number]

## 2020-12-27 ENCOUNTER — Telehealth: Payer: Self-pay | Admitting: *Deleted

## 2020-12-27 NOTE — Telephone Encounter (Signed)
Faxed completed/signed Tysabri pt status report and reauth questionnaire to [place] [facility] at [phone]. Received confirmation.   Received fax from touch prescribing program that pt is re-authorized from 12/27/20-07/13/21. Pt enrollment number: [id number]. Account: [facility]. Site auth number: [id number]

## 2020-12-28 ENCOUNTER — Telehealth: Payer: Self-pay | Admitting: *Deleted

## 2020-12-28 ENCOUNTER — Other Ambulatory Visit: Payer: Self-pay | Admitting: *Deleted

## 2020-12-28 DIAGNOSIS — G35 Multiple sclerosis: Secondary | ICD-10-CM | POA: Diagnosis not present

## 2020-12-28 DIAGNOSIS — Z79899 Other long term (current) drug therapy: Secondary | ICD-10-CM

## 2020-12-28 NOTE — Addendum Note (Signed)
Addended by: Tamera Stands D on: 12/28/2020 02:57 PM   Modules accepted: Orders

## 2020-12-28 NOTE — Telephone Encounter (Signed)
Placed JCV lab in [facility] lock box for routine lab pick up. Results pending. 

## 2020-12-29 ENCOUNTER — Telehealth: Payer: Self-pay | Admitting: *Deleted

## 2020-12-29 LAB — CBC WITH DIFFERENTIAL/PLATELET
Basophils Absolute: 0.1 10*3/uL (ref 0.0–0.2)
Basos: 1 %
EOS (ABSOLUTE): 0.6 10*3/uL — ABNORMAL HIGH (ref 0.0–0.4)
Eos: 6 %
Hematocrit: 40 % (ref 34.0–46.6)
Hemoglobin: 13.8 g/dL (ref 11.1–15.9)
Immature Grans (Abs): 0 10*3/uL (ref 0.0–0.1)
Immature Granulocytes: 0 %
Lymphocytes Absolute: 3.8 10*3/uL — ABNORMAL HIGH (ref 0.7–3.1)
Lymphs: 42 %
MCH: 31.4 pg (ref 26.6–33.0)
MCHC: 34.5 g/dL (ref 31.5–35.7)
MCV: 91 fL (ref 79–97)
Monocytes Absolute: 0.7 10*3/uL (ref 0.1–0.9)
Monocytes: 8 %
Neutrophils Absolute: 4 10*3/uL (ref 1.4–7.0)
Neutrophils: 43 %
Platelets: 297 10*3/uL (ref 150–450)
RBC: 4.4 x10E6/uL (ref 3.77–5.28)
RDW: 13.7 % (ref 11.7–15.4)
WBC: 9.2 10*3/uL (ref 3.4–10.8)

## 2020-12-29 NOTE — Telephone Encounter (Signed)
Received request from pharmacy for PA on Aimovig.   PA submitted.   Dx: R51.9- chronic daily headache  Received immediate determination.   PA [id number] approved 11/29/2020- 12/29/2021.

## 2020-12-30 ENCOUNTER — Ambulatory Visit (INDEPENDENT_AMBULATORY_CARE_PROVIDER_SITE_OTHER): Payer: BC Managed Care – PPO | Admitting: Neurology

## 2020-12-30 ENCOUNTER — Encounter: Payer: Self-pay | Admitting: Neurology

## 2020-12-30 VITALS — BP 115/86 | HR 89 | Ht 64.0 in | Wt 224.5 lb

## 2020-12-30 DIAGNOSIS — Z79899 Other long term (current) drug therapy: Secondary | ICD-10-CM

## 2020-12-30 DIAGNOSIS — R200 Anesthesia of skin: Secondary | ICD-10-CM

## 2020-12-30 DIAGNOSIS — R208 Other disturbances of skin sensation: Secondary | ICD-10-CM

## 2020-12-30 DIAGNOSIS — H814 Vertigo of central origin: Secondary | ICD-10-CM | POA: Diagnosis not present

## 2020-12-30 DIAGNOSIS — R5383 Other fatigue: Secondary | ICD-10-CM

## 2020-12-30 DIAGNOSIS — G35 Multiple sclerosis: Secondary | ICD-10-CM

## 2020-12-30 NOTE — Progress Notes (Signed)
[facility]  PATIENT: Virginia [name] DOB: December 03, 1980  REFERRING DOCTOR OR PCP:    Blair Heys SOURCE: patient, admit/discharge hospital notes, imaging reports, MRI's on PACS  _________________________________   HISTORICAL  CHIEF COMPLAINT:  Chief Complaint  Patient presents with  . Follow-up    RM 12, alone. Last seen 06/24/20. On Tysabri for MS. Last: 12/28/20. Next: 01/25/21.  JCV drawn 12/28/20 still pending.    HISTORY OF PRESENT ILLNESS:  Virginia [name] is a 40 y.o. woman with relapsing remitting multiple sclerosis who also has back pain.  Update 12/30/2020: She is on Tysabri 300 mg q4 weeks.  Last JCV Ab was 12/28/2020 and is pending.   MRI of the brain 01/20/2020 showed scattered T2/FLAIR hyperintense foci in the hemispheres and medulla in a pattern and configuration consistent with chronic demyelinating plaque associated with multiple sclerosis.   Compared to the MRI from 01/17/2019, there are no new lesions.  She has more headaches (daily), usually starting on the right but holocephalic with pulsating quality when it intensifies (2-3 times a week).   She was prescribed Aimovig and has taken twice.   It only helps a little bit.    Topiramate caused cognitive chages.   Sumatriptan (usually a second one 2 hours later often helps).   She has been on amitriptyline in the past   She is noting more fatigue.   She notes sleep is poor at night.    Mood is fine.     Gait is about the same with mildly reduced balance.   She stumbles but no recent falls.  She has burning in her legs, right > left helped some by gabapentin 600 mg po qid.   She is noting more muscle cramps in her legs, esp calves.     She has occasional vertigo spells, triggered by Movements.   She denies hearing loss.   Meclizine helps some but makes her sleepy.  Bladder function is fine.    She has low back pain, worse when she stands.  Sometimes she gets pain in the feet as well.  She has a disc  herniation at L5-S1.  She also has neck pain but not arm pain.    She had Covid-19 in January 2022.   She had one vaccine when it occurred and had to delay the second one.    MS History:   In mid May 2017, she was starting to experience occasional lightheadedness and a spinning vertigo. On 03/19/2016, she had more extreme vertigo and had an episode of syncope. She notices that when she was walking she would veer towards the right. Because of the syncope, she was taken to the emergency room. He had an MRI of the brain that showed several enhancing lesions, potentially worrisome for multiple sclerosis. Additional studies were performed. She received 3 days of IV steroids. The MRI's of the spine did not show any additional plaques. The lumbar puncture showed oligoclonal bands with normal IgG index, consistent with multiple sclerosis.   She started  Aubagio.   MRI in 2018 showed additional foci plus she had tolerability issues.   She has been on Tysabri since 12/26/2016    LABS/LP She underwent a lumbar puncture on 03/21/2016. 4 oligoclonal bands present in the CSF which were not present in the serum, consistent with multiple sclerosis. The IgG index was high normal at 0.6.   There were only 4 white blood cells but 280 red blood cells more consistent with a slightly traumatic tap.  [facility] Spotted Fever CSF IgG was negative but the IgM was positive at 1.41 (less than 0.9 normal).    ANA and ANCA were both negative.  IMAGING MRI images from 03/20/2016, 03/22/2016 and CT angiogram images from 03/23/2016 reviewed: The MRI of the brain shows several 2/fair hyperintense foci, some in the periventricular and juxtacortical white matter. Most of the foci enhanced after contrast administration. MRI of the cervical and thoracic spine did not show any spinal cord plaques and there was no significant degenerative change.   CT angiogram was essentially normal showing no significant stenosis.  MRI 11/28/2016  showed multiple periventricular, subcortical, pericallosal as well as craniovertebral junction white matter hyperintensities compatible with multiple sclerosis. Multiple enhancing lesions are noted in the periventricular regions with the largest one in the left posterior frontal subcortical white matter. Incidental large venous angioma is noted in the right parietal region. Compared with MRI scan dated 03/20/2016 there are several new enhancing as well as nonenhancing white matter lesions.  MRI 12/25/2016 of the cervical spine showed two T2 hyperintense foci within the left posterolateral spinal cord, one adjacent to C6-C7 and one adjacent to T1. There may have been subtle signal within the spinal cord adjacent to C6-C7 on the previous scan but the current focus is clearly larger. The focus adjacent to T1 was not evident on the previous MRI. Neither of these 2 foci enhanced with contrast.         There is mild spinal stenosis at C4-C5, C5-C6 and C6-C7, unchanged when compared to the previous MRI. There is mild right foraminal narrowing at C6-C7 but there is no nerve root compression.  MRI 01/10/2018 of the brain showed multiple T2/FLAIR hyperintense foci in the brainstem and in the periventricular, juxtacortical and deep white matter in a pattern and configuration consistent with chronic demyelinating plaque associated with multiple sclerosis.  Compared to the MRI dated 11/28/2016, there is an improved appearance in the lesions that were enhancing at that time no longer do so.  There are no new lesions.  MRI [facility] 01/27/2018 showed, at L4-L5, there is stable mild left facet hypertrophy and mild ligamentum flavum hypertrophy.  There is no nerve root compression.     At L5-S1, there is a stable small central disc herniation causing bilateral lateral recess narrowing.  This contacts both of the S1 nerve roots.  There is no definite change when compared to the previous MRI.  MRI brain 01/17/2019 showed multiple  T2/flair hyperintense foci in the hemispheres in a pattern and configuration consistent with chronic demyelinating plaque associated with multiple sclerosis.  Two foci that have been seen previously in the brainstem are not apparent on the current MRI.  There are no new lesions compared to the 2019 MRI.  None of the foci enhance.    There is a small developmental venous anomaly in the right parietal lobe that is unchanged in appearance.  Otherwise, the enhancement pattern is normal.  MRI of the brain 01/20/2020 showed scattered T2/FLAIR hyperintense foci in the hemispheres and medulla in a pattern and configuration consistent with chronic demyelinating plaque associated with multiple sclerosis.   Compared to the MRI from 01/17/2019, there are no new lesions.     REVIEW OF SYSTEMS: Constitutional: No fevers, chills, sweats, or change in appetite.   She has fatigue. Insomnia much better with temazepam Eyes: No visual changes, double vision, eye pain Ear, nose and throat: No hearing loss, ear pain, nasal congestion, sore throat Cardiovascular: No chest pain,  palpitations Respiratory: No shortness of breath at rest or with exertion.   No wheezes GastrointestinaI: No nausea, vomiting, diarrhea, abdominal pain, fecal incontinence Genitourinary: No dysuria, urinary retention.   She reports frequency.  No nocturia. Musculoskeletal: No neck pain, back pain Integumentary: No rash, pruritus, skin lesions Neurological: as above Psychiatric: No depression at this time.  No anxiety Endocrine: No palpitations, diaphoresis, change in appetite, change in weigh or increased thirst Hematologic/Lymphatic: No anemia, purpura, petechiae. Allergic/Immunologic: No itchy/runny eyes, nasal congestion, recent allergic reactions, rashes  ALLERGIES: Allergies  Allergen Reactions  . Codeine Other (See Comments)    Hallucinations   . Onion   . Topamax [Topiramate]     Was talking out of her head, walked into walls     HOME MEDICATIONS:  Current Outpatient Medications:  .  albuterol (VENTOLIN HFA) 108 (90 Base) MCG/ACT inhaler, Inhale 2 puffs into the lungs every 6 (six) hours as needed for wheezing or shortness of breath., Disp: 8 g, Rfl: 0 .  amitriptyline (ELAVIL) 50 MG tablet, TAKE 1 TABLET BY MOUTH AT BEDTIME AS NEEDED FOR SLEEP, Disp: 30 tablet, Rfl: 8 .  atenolol (TENORMIN) 50 MG tablet, TAKE 1 TABLET BY MOUTH EVERY DAY, Disp: 30 tablet, Rfl: 11 .  baclofen (LIORESAL) 10 MG tablet, Take one pill 3 to 5 times a day as needed for spasticity, Disp: 450 tablet, Rfl: 11 .  benzonatate (TESSALON) 100 MG capsule, Take 1 capsule (100 mg total) by mouth 2 (two) times daily as needed for cough., Disp: 20 capsule, Rfl: 0 .  clonazePAM (KLONOPIN) 1 MG tablet, TAKE 1 TABLET BY MOUTH TWICE A DAY AS NEEDED FOR ANXIETY, Disp: 20 tablet, Rfl: 1 .  Erenumab-aooe (AIMOVIG) 140 MG/ML SOAJ, Inject 140 mg into the skin every 30 (thirty) days., Disp: 1.12 mL, Rfl: 5 .  gabapentin (NEURONTIN) 600 MG tablet, TAKE 1 TABLET (600 MG TOTAL) BY MOUTH 4 (FOUR) TIMES DAILY., Disp: 360 tablet, Rfl: 3 .  meclizine (ANTIVERT) 25 MG tablet, TAKE 1 TABLET (25 MG TOTAL) BY MOUTH 3 (THREE) TIMES DAILY AS NEEDED FOR DIZZINESS., Disp: 30 tablet, Rfl: 0 .  metroNIDAZOLE (METROCREAM) 0.75 % cream, APPLY TO AFFECTED AREA TWICE A DAY, Disp: 45 g, Rfl: 0 .  natalizumab (TYSABRI) 300 MG/15ML injection, Inject 300 mg into the vein every 28 (twenty-eight) days., Disp: , Rfl:  .  pantoprazole (PROTONIX) 40 MG tablet, TAKE 1 TABLET BY MOUTH TWICE A DAY, Disp: 60 tablet, Rfl: 5 .  sodium chloride (OCEAN) 0.65 % SOLN nasal spray, Place 1 spray into both nostrils as needed for congestion., Disp: 88 mL, Rfl: 0 .  sucralfate (CARAFATE) 1 g tablet, TAKE 1 TABLET BY MOUTH 4 TIMES DAILY - WITH MEALS AND AT BEDTIME., Disp: 120 tablet, Rfl: 1 .  SUMAtriptan (IMITREX) 50 MG tablet, Take 1 tablet (50 mg total) by mouth every 2 (two) hours as needed for migraine.  May repeat in 2 hours if headache persists or recurs., Disp: 10 tablet, Rfl: 5 .  temazepam (RESTORIL) 30 MG capsule, TAKE 1 CAPSULE (30 MG TOTAL) BY MOUTH AT BEDTIME AS NEEDED FOR SLEEP (DO NOT TAKE WITH KLONOPIN)., Disp: 30 capsule, Rfl: 0  PAST MEDICAL HISTORY: Past Medical History:  Diagnosis Date  . Anxiety   . Headache   . Insomnia   . Multiple sclerosis (HCC)   . Neuromuscular disorder (HCC)    Phreesia 06/30/2020  . Nystagmus 02/15/2017  . Syncope and collapse     PAST SURGICAL HISTORY: History  reviewed. No pertinent surgical history.  FAMILY HISTORY: Family History  Problem Relation Age of Onset  . Emphysema Mother   . Osteoarthritis Mother   . Anxiety disorder Mother   . Other Father   . Suicidality Father   . ALS Maternal Grandmother   . Colon cancer Neg Hx   . Colon polyps Neg Hx   . Esophageal cancer Neg Hx   . Rectal cancer Neg Hx   . Stomach cancer Neg Hx     SOCIAL HISTORY:  Social History   Socioeconomic History  . Marital status: Married    Spouse name: Not on file  . Number of children: Not on file  . Years of education: Not on file  . Highest education level: Not on file  Occupational History  . Not on file  Tobacco Use  . Smoking status: Current Every Day Smoker    Types: E-cigarettes  . Smokeless tobacco: Never Used  Substance and Sexual Activity  . Alcohol use: Yes    Alcohol/week: 0.0 standard drinks    Comment: rarely  . Drug use: No  . Sexual activity: Not on file  Other Topics Concern  . Not on file  Social History Narrative  . Not on file   Social Determinants of Health   Financial Resource Strain: Not on file  Food Insecurity: Not on file  Transportation Needs: Not on file  Physical Activity: Not on file  Stress: Not on file  Social Connections: Not on file  Intimate Partner Violence: Not on file     PHYSICAL EXAM  Vitals:   12/30/20 1454  BP: 115/86  Pulse: 89  Weight: 224 lb 8 oz (101.8 kg)  Height: 5\' 4"   (1.626 m)    Body mass index is 38.54 kg/m.   General: The patient is well-developed and well-nourished and in no acute distress,.  Mild pedal edema on the left.   No occipital tenderness Neurologic Exam  Mental status: The patient is alert and oriented x 3 at the time of the examination. The patient has apparent normal recent and remote memory, with an apparently normal attention span and concentration ability.   Speech is normal.  Cranial nerves: Extraocular muscles are full.  Now no end gaze nystagmus to the left..  Facial strength and sensation is normal. Trapezius strength is normal. No obvious hearing deficits are noted.  Motor:  Muscle bulk is normal.  Strength is 5/5. Tone is mildly increased in the legs.  Sensory: She had intact sensation to touch and vibration in arms but reduced sensation to touch/temp and vibration in right leg.    Coordination: Cerebellar testing reveals good finger-nose-finger and heel-to-shin bilaterally.  Gait and station: Station is normal.   The gait is minimally wide.  Tandem gait is mildly wide  Romberg is negative.  Reflexes: Deep tendon reflexes are symmetric and normal in arms.  DTRs are brisk at knees with some spread but no ankle clonus.    DIAGNOSTIC DATA (LABS, IMAGING, TESTING) - I reviewed patient records, labs, notes, testing and imaging myself where available.  Lab Results  Component Value Date   WBC 9.2 12/28/2020   HGB 13.8 12/28/2020   HCT 40.0 12/28/2020   MCV 91 12/28/2020   PLT 297 12/28/2020      Component Value Date/Time   NA 140 08/24/2020 1643   K 3.8 08/24/2020 1643   CL 113 (H) 08/24/2020 1643   CO2 20 08/24/2020 1643   GLUCOSE 115 (H) 08/24/2020 1643  BUN 16 08/24/2020 1643   CREATININE 0.90 08/24/2020 1643   CALCIUM 8.9 08/24/2020 1643   PROT 6.2 08/24/2020 1643   PROT 6.1 12/04/2016 1131   ALBUMIN 3.7 12/04/2016 1131   AST 10 08/24/2020 1643   ALT 10 08/24/2020 1643   ALKPHOS 73 12/04/2016 1131    BILITOT 0.3 08/24/2020 1643   BILITOT <0.2 12/04/2016 1131   GFRNONAA 80 08/24/2020 1643   GFRAA 93 08/24/2020 1643    Lab Results  Component Value Date   TSH 1.010 01/01/2018       ASSESSMENT AND PLAN    1. Multiple sclerosis (HCC)   2. High risk medication use   3. Other fatigue   4. Vertigo of central origin   5. Numbness   6. Dysesthesia     1.    Continue Tysabri 300 mg every 4 weeks.  Check JCV antibody and CBC every 6 months.  Current labs are pending.    We will check MRI around time of next visit to determine if subclinical progression.   If this is occurring consider a switch to another DMT.   2.   Continue baclofen up to 5 times a day   3.   Continue gabapentin 600 mg 3-4 times a day -- I burning pain worsens, consider lamotrigine.    4.   For headaches, continue Aimovig.   Take amitriptyline 50 mg nightly.    5.   Return 5-6 months.  Call sooner if there are new or worsening neurologic symptoms.  Doratha Mcswain A. Epimenio Foot, MD, PhD 12/30/2020, 3:37 PM Certified in Neurology, Clinical Neurophysiology, Sleep Medicine, Pain Medicine and Neuroimaging  [facility] [address] [place], [place] [address] [phone]

## 2021-01-03 ENCOUNTER — Other Ambulatory Visit: Payer: Self-pay | Admitting: Family Medicine

## 2021-01-04 LAB — RFLX STRATIFY JCV (TM) AB INHIBITION: JCV Antibody by Inhibition: NEGATIVE

## 2021-01-04 LAB — STRATIFY JCV AB (W/ INDEX) W/ RFLX
Index Value: 0.25 — ABNORMAL HIGH
Stratify JCV (TM) Ab w/Reflex Inhibition: UNDETERMINED — AB

## 2021-01-05 ENCOUNTER — Other Ambulatory Visit: Payer: Self-pay | Admitting: Neurology

## 2021-01-06 ENCOUNTER — Other Ambulatory Visit: Payer: Self-pay

## 2021-01-06 ENCOUNTER — Ambulatory Visit (INDEPENDENT_AMBULATORY_CARE_PROVIDER_SITE_OTHER): Payer: BC Managed Care – PPO | Admitting: Family Medicine

## 2021-01-06 ENCOUNTER — Encounter: Payer: Self-pay | Admitting: Family Medicine

## 2021-01-06 VITALS — BP 118/68 | HR 84 | Temp 97.6°F | Resp 14 | Ht 64.0 in | Wt 222.0 lb

## 2021-01-06 DIAGNOSIS — F410 Panic disorder [episodic paroxysmal anxiety] without agoraphobia: Secondary | ICD-10-CM

## 2021-01-06 DIAGNOSIS — R55 Syncope and collapse: Secondary | ICD-10-CM | POA: Diagnosis not present

## 2021-01-06 DIAGNOSIS — F411 Generalized anxiety disorder: Secondary | ICD-10-CM

## 2021-01-06 MED ORDER — DULOXETINE HCL 60 MG PO CPEP: 60.0000 mg | ORAL_CAPSULE | Freq: Every day | ORAL | 3 refills | Status: DC

## 2021-01-06 NOTE — Telephone Encounter (Signed)
JCV ab drawn on 12/28/20 indeterminate, index: 0.25, inhibition assay: negative

## 2021-01-06 NOTE — Progress Notes (Signed)
Subjective:    Patient ID: Virginia [name], female    DOB: 24-Aug-1981, 40 y.o.   MRN: [id number]  HPI  03/07/18 Patient is urgently worked in today based on a message she sent me this morning.  Apparently she is having episodes of amnestic behavior.  For instance she recently woke up in bed covered and potato chips.  She has no recollection of going to bed or eating potato chips.  She also had an instance where she was playing a game on her phone.  When she woke up, she was still holding her phone.  She was standing.  However she had a pill in her mouth.  She has no recollection of taking the medication.  Her son states that yesterday, she sat down with him to watch a show on television.  She remembers sitting down to watch TV.  After that she has no memory.  Son states that she had her eyes open was staring blankly.  She seemed extremely lethargic.  She was mumbling incoherently.  He was unable to wake her up and bring her around.  These behaviors are lasting minutes to an hour.  They are associated with a period of altered consciousness, amnesia, unusual behavior, and lethargy after the associated event.  Episodes sound atypical for syncope.  I do not believe this is cardiac in nature despite her recent episode of ventricular tachycardia.  Because episodes that are witnessed, the patient has her eyes open, she is lethargic, however she is still physically interacting with her environment as demonstrated by the potato chips, taking the medication that she has no recollection of, etc.  Episodes sound like overmedication versus atypical seizure (absent seizure).  EKG is performed today and shows a normal QTc interval.  Therefore I do not believe medications are prolonging her QTc interval causing repeated episodes of ventricular tachycardia.  I do not believe beta-blockers will have this effect on an individual.  Reviewing her medication list, she is on 50 mg of imipramine, she takes 10 mg of baclofen 3 times  daily.  She takes 600 mg of gabapentin 4 times a day, she is taking temazepam 30 mg a day.  She has been on all of these medications for a prolonged period of time and has habituated to them up to this point.  At that time, my plan was: Discussed the situation with the patient.  Differential diagnosis includes overmedication versus absent (atypical) seizure's.  Other possibilities would be cardiogenic syncope, somatizations disorder.  I have recommended decreasing her medication temporarily to see if episodes persist.  I have recommended discontinuing imipramine that she takes for pain and insomnia.  I have recommended reducing the dose of baclofen to 5 mg twice daily due to its sedative effects.  I hesitate to decrease the dose of gabapentin given its antiepileptic effect in case this is in fact some type of seizure behavior.  I have also recommended decreasing her dose of temazepam by 50%.  I would like to recheck the patient on Monday to see if behavior continues.  I reached out to the patient's neurologist but he was not in the office.  His nurse states that the patient has had several EEG's and therefore they do not believe this is seizure behavior.  Therefore I was unable to schedule the patient a visit tomorrow with her neurologist.  I will however send him in a message to make him aware of the situation.  03/11/18 Patient is here today for follow-up.  Since I last saw her, she has had no further episodes of altered mental status.  She is here today with her partner.  He denies any episodes of confusion, absent staring, she denies any episodes of bizarre behavior without recollection or retrograde amnesia.  They both deny any seizure activity.  Virginia [name] is been very short since I last saw her but I am cautiously optimistic that this may be medication side effects.  Unfortunately since I last saw her, she has had several episodes of tachycardia.  Her heart will feel like it is pounding out of her chest.   Episodes can last 2 to 3 seconds.  This morning and episodes seem to go much longer.  She denies any syncope or lightheadedness.  She is currently taking atenolol 25 mg a day.  At that time, my plan was:  Patient would like to cut back on the gabapentin because of the way it makes her feel rather than the medications we held at her last visit.  I have recommended that since she has had no further episodes we make no further changes in her medications at the present time and simply monitor her over the next 1 to 2 weeks to see if these episodes continue to occur.  However, she should discuss with her neurologist cutting down on her medication if she decides this is in fact her best course of action.  Regarding her episode of tachycardia, I have recommended increasing the atenolol to 50 mg a day.  I believe that her previous dose was insufficient given her body weight.  If she continues to have episodes of tachycardia, I will contact her cardiologist to discuss further options.  05/13/18 Please see the patient's recent email.  She is here today to discuss this further.  She states 3-4 times a week, she will awaken startled, disoriented, and in a panic.  Her heart will race.  Symptoms can last minutes up to 30 minutes.  When I asked her to clarify, she admits that she is usually laying down on the couch to watch TV.  She will then pass out and awake and confused sometime later.  Sometimes she will be disoriented and talking in a confused manner.  However her husband states that she is not slurring her speech or appearing intoxicated.  Instead she is enunciating well and speaking normally but about confusing and incoherent subjects.  She is currently taking baclofen 10 mg 3 times a day for chronic low back pain in addition to gabapentin 600 mg p.o. 3 times daily for neuropathic pain.  MRI of the lumbar spine earlier this year revealed a mild small bulging disc at L5-S1 possibly touching the S1 nerve roots.   Apparently she tried an epidural steroid injection but she states that she "went crazy in the office and passed out".  At that time, my plan was: I believe that she is passing out due to medication.  I believe she then awakens confused and disoriented and has a panic attack causing her tachycardia or perceived tachycardia.  Therefore I recommended decreasing her sedating medication.  I have asked her to discontinue baclofen.  She will replace it with diclofenac 75 mg p.o. twice daily for low back pain.  We will start Lexapro 10 mg p.o. daily in an effort to try to prevent and control panic attacks and I will see the patient back in 4 weeks.  At that time I will try to decrease her dose of gabapentin.  I believe  that with less sedating medication, she will have less of these incidents.  However we will likely need to find better options for pain control possibly through a pain clinic.  06/10/18 Patient states the Lexapro was not helping at all.  She continues to awaken from sleeping in a panic with her heart racing however she is now not quite as disoriented.  Unfortunately the patient continues to use baclofen.  She is try to use less but she continues to have severe muscle spasms and she requires medication.  Her biggest concern is that she is not sleeping.  She states that she goes all night long without sleeping.  She will sometimes drift off during the day for 2 or 3 hours at a time then awaken startled in a panic slightly confused.  However she is not getting decent restorative sleep.  She goes to sleep with the TV on at night.  She needs background noise to fall asleep.  She will toss and turn for hours.  Then she gets up and watch TV or plays with her dog.  She drinks caffeine all throughout the day even up until bedtime.  She is not getting any regular aerobic exercise.  She is not doing any physical activity.  He is taking temazepam prior to bedtime and she has been taking this for 2 years but is lost  its effectiveness and now does not cause her to feel sleepy.  However she does take cat naps throughout the day due to just simply passing out from exhaustion coupled with her medication.  She states that she has to lay down during the day because her back hurts and she will easily fall asleep then.  At that time, my plan was: I am not certain if the patient is waking up with tachycardia due to a panic attack or if she may be having sleep apneic episodes.  She would be at risk for sleep apnea and I believe she needs a sleep study and to see a sleep specialist to evaluate this further.  Furthermore I believe a large component of her problem is her terrible sleep hygiene.  I recommended that she not fall asleep with the TV going in the background.  Instead I recommended that she replace this with white noise such as the sound of rain, a fan, etc.  I have recommended that if she is unable to fall asleep after 15 minutes, she gets out of the bed but does not watch TV or play with her pet.  Instead I recommended that she read in a quiet room something boring such as a magazine or a book and as soon as she starts to feel sleepy I recommended that she go back to bed.  I would supplement the temazepam at night with amitriptyline 50 mg p.o. daily because I do believe she is habituated to the temazepam.  During the daytime we have got to try to keep her awake and more active.  I recommended that she get 1 hour a day of aerobic exercise such as swimming at the [facility] and water aerobics to spin physical energy.  I recommended that she discontinue caffeine in the afternoon several hours prior to bedtime.  I would like to see the patient back in 1 month to see if these measures of help.  Discontinue Lexapro as it has been ineffective  01/06/21 Patient is having similar attacks where she feels like she is going to pass out.  These occur for no reason.  It does not seem to be brought on by activity.  It does not occur at  particular times of the day.  She states that she was to start to feel lightheaded like she is going to pass out.  She does not do anything to trigger it.  She also does not do anything to make it go away.  The symptoms are gradually fade.  She denies feeling her heart race or skip or flutter when this occurs.  She denies feeling any chest pain or shortness of breath however she does feel extremely anxious when this happens.  She denies any seizure-like activity or loss of bowel or bladder incontinence or any muscle jerking or muscle twitching. Past Medical History:  Diagnosis Date  . Anxiety   . Headache   . Insomnia   . Multiple sclerosis (HCC)   . Neuromuscular disorder (HCC)    Phreesia 06/30/2020  . Nystagmus 02/15/2017  . Syncope and collapse    History reviewed. No pertinent surgical history. Current Outpatient Medications on File Prior to Visit  Medication Sig Dispense Refill  . SUMAtriptan (IMITREX) 50 MG tablet TAKE 1 TABLET EVERY 2 HRS AS NEEDED FOR MIGRAINE. MAY REPEAT IN 2 HRS IF HEADACHE PERSISTS OR RECURS 10 tablet 5  . albuterol (VENTOLIN HFA) 108 (90 Base) MCG/ACT inhaler Inhale 2 puffs into the lungs every 6 (six) hours as needed for wheezing or shortness of breath. 8 g 0  . amitriptyline (ELAVIL) 50 MG tablet TAKE 1 TABLET BY MOUTH AT BEDTIME AS NEEDED FOR SLEEP 30 tablet 8  . atenolol (TENORMIN) 50 MG tablet TAKE 1 TABLET BY MOUTH EVERY DAY 30 tablet 11  . baclofen (LIORESAL) 10 MG tablet Take one pill 3 to 5 times a day as needed for spasticity 450 tablet 11  . benzonatate (TESSALON) 100 MG capsule Take 1 capsule (100 mg total) by mouth 2 (two) times daily as needed for cough. 20 capsule 0  . clonazePAM (KLONOPIN) 1 MG tablet TAKE 1 TABLET BY MOUTH TWICE A DAY AS NEEDED FOR ANXIETY 20 tablet 1  . Erenumab-aooe (AIMOVIG) 140 MG/ML SOAJ Inject 140 mg into the skin every 30 (thirty) days. 1.12 mL 5  . gabapentin (NEURONTIN) 600 MG tablet TAKE 1 TABLET (600 MG TOTAL) BY MOUTH 4  (FOUR) TIMES DAILY. 360 tablet 3  . meclizine (ANTIVERT) 25 MG tablet TAKE 1 TABLET (25 MG TOTAL) BY MOUTH 3 (THREE) TIMES DAILY AS NEEDED FOR DIZZINESS. 30 tablet 0  . metroNIDAZOLE (METROCREAM) 0.75 % cream APPLY TO AFFECTED AREA TWICE A DAY 45 g 0  . natalizumab (TYSABRI) 300 MG/15ML injection Inject 300 mg into the vein every 28 (twenty-eight) days.    . pantoprazole (PROTONIX) 40 MG tablet TAKE 1 TABLET BY MOUTH TWICE A DAY 60 tablet 5  . sucralfate (CARAFATE) 1 g tablet TAKE 1 TABLET BY MOUTH 4 TIMES DAILY - WITH MEALS AND AT BEDTIME. 120 tablet 1  . temazepam (RESTORIL) 30 MG capsule TAKE 1 CAPSULE (30 MG TOTAL) BY MOUTH AT BEDTIME AS NEEDED FOR SLEEP (DO NOT TAKE WITH KLONOPIN). 30 capsule 0   No current facility-administered medications on file prior to visit.   Allergies  Allergen Reactions  . Codeine Other (See Comments)    Hallucinations   . Onion   . Topamax [Topiramate]     Was talking out of her head, walked into walls   Social History   Socioeconomic History  . Marital status: Married    Spouse name: Not  on file  . Number of children: Not on file  . Years of education: Not on file  . Highest education level: Not on file  Occupational History  . Not on file  Tobacco Use  . Smoking status: Former Smoker    Types: E-cigarettes  . Smokeless tobacco: Never Used  Substance and Sexual Activity  . Alcohol use: Yes    Alcohol/week: 0.0 standard drinks    Comment: rarely  . Drug use: No  . Sexual activity: Yes  Other Topics Concern  . Not on file  Social History Narrative  . Not on file   Social Determinants of Health   Financial Resource Strain: Not on file  Food Insecurity: Not on file  Transportation Needs: Not on file  Physical Activity: Not on file  Stress: Not on file  Social Connections: Not on file  Intimate Partner Violence: Not on file      Review of Systems  All other systems reviewed and are negative.      Objective:   Physical  Exam Vitals reviewed.  Constitutional:      General: She is not in acute distress.    Appearance: She is well-developed. She is not diaphoretic.  HENT:     Head: Normocephalic and atraumatic.     Right Ear: External ear normal.     Left Ear: External ear normal.     Nose: Nose normal.     Mouth/Throat:     Pharynx: No oropharyngeal exudate.  Cardiovascular:     Rate and Rhythm: Normal rate and regular rhythm.     Heart sounds: Normal heart sounds. No murmur heard. No friction rub. No gallop.   Pulmonary:     Effort: Pulmonary effort is normal. No respiratory distress.     Breath sounds: Normal breath sounds. No stridor. No wheezing or rales.  Abdominal:     General: Bowel sounds are normal.     Palpations: Abdomen is soft.  Neurological:     Mental Status: She is alert and oriented to person, place, and time.     Cranial Nerves: No cranial nerve deficit.     Sensory: No sensory deficit.     Motor: No abnormal muscle tone.     Coordination: Coordination normal.     Deep Tendon Reflexes: Reflexes normal.  Psychiatric:        Behavior: Behavior normal.        Thought Content: Thought content normal.          Assessment & Plan:  GAD (generalized anxiety disorder)  Panic disorder  Near syncope  Spent the majority of the visit today in discussion with the patient.  I explained to her that the 2 most likely options would be either a cardiac arrhythmia causing near syncope versus a panic attack with vasovagal symptoms and lightheadedness.  Given her previous history and her history of anxiety I truly believe in the heart that she is having panic attacks and vasovagal symptoms.  Therefore focus again on trying to control her anxiety.  Recommended we try Cymbalta 60 mg a day and then recheck in 1 month.  We also discussed a referral back to Dr. Ladona Ridgel her cardiologist for another event monitor however she would like to try the Cymbalta first if she agrees that anxiety could be  playing a large role in this as she is always under stress

## 2021-01-10 ENCOUNTER — Other Ambulatory Visit: Payer: Self-pay | Admitting: Family Medicine

## 2021-01-10 DIAGNOSIS — F411 Generalized anxiety disorder: Secondary | ICD-10-CM

## 2021-01-26 DIAGNOSIS — G35 Multiple sclerosis: Secondary | ICD-10-CM | POA: Diagnosis not present

## 2021-01-31 ENCOUNTER — Ambulatory Visit (INDEPENDENT_AMBULATORY_CARE_PROVIDER_SITE_OTHER): Payer: BC Managed Care – PPO | Admitting: Nurse Practitioner

## 2021-01-31 ENCOUNTER — Encounter: Payer: Self-pay | Admitting: Family Medicine

## 2021-01-31 ENCOUNTER — Encounter: Payer: Self-pay | Admitting: Nurse Practitioner

## 2021-01-31 ENCOUNTER — Other Ambulatory Visit: Payer: Self-pay

## 2021-01-31 ENCOUNTER — Other Ambulatory Visit: Payer: Self-pay | Admitting: *Deleted

## 2021-01-31 VITALS — BP 116/74 | HR 86 | Temp 98.6°F | Wt 210.4 lb

## 2021-01-31 DIAGNOSIS — Z124 Encounter for screening for malignant neoplasm of cervix: Secondary | ICD-10-CM

## 2021-01-31 DIAGNOSIS — R197 Diarrhea, unspecified: Secondary | ICD-10-CM

## 2021-01-31 DIAGNOSIS — K649 Unspecified hemorrhoids: Secondary | ICD-10-CM

## 2021-01-31 DIAGNOSIS — R87810 Cervical high risk human papillomavirus (HPV) DNA test positive: Secondary | ICD-10-CM | POA: Diagnosis not present

## 2021-01-31 DIAGNOSIS — R87612 Low grade squamous intraepithelial lesion on cytologic smear of cervix (LGSIL): Secondary | ICD-10-CM | POA: Diagnosis not present

## 2021-01-31 MED ORDER — HYDROCORTISONE ACETATE 25 MG RE SUPP: 25.0000 mg | Freq: Two times a day (BID) | RECTAL | 0 refills | Status: DC

## 2021-01-31 NOTE — Progress Notes (Signed)
Subjective:    Patient ID: Mickel Fuchs, female    DOB: [birth year], 40 y.o.   MRN: [id number]  HPI: Korrie Hofbauer is a 40 y.o. female presenting for diarrhea.  Chief Complaint  Patient presents with  . Diarrhea    2 wks, using immodium for the sx, not eating much   ABDOMINAL ISSUES Duration: weeks Nature: diarrhea - water  Location:  diffuse Severity: severe Radiation: no Episode duration: minutes Frequency: at least daily Alleviating factors: nothing  Aggravating factors: nothing Treatments attempted: Immodium, cream, medicated wipes, ointment, gatorade  Constipation: no Diarrhea: yes Episodes of diarrhea/day: at least 6 Mucous in the stool: no Heartburn: no Bloating:yes Flatulence: no Nausea: no Vomiting: no Episodes of vomit/day: 0 Melena or hematochezia: yes; when wiping - has a hemorrhoid Rash: no Jaundice: no Fever: no Weight loss: yes  Dizziness: yes, worse than normal Weakness: yes  Of note, patient reports she is due for a pap smear, has not had one in >10 years.  Also reports her mother was diagnosed with uterine cancer in 30s-40s.  Allergies  Allergen Reactions  . Codeine Other (See Comments)    Hallucinations   . Onion   . Topamax [Topiramate]     Was talking out of her head, walked into walls    Outpatient Encounter Medications as of 01/31/2021  Medication Sig Note  . albuterol (VENTOLIN HFA) 108 (90 Base) MCG/ACT inhaler Inhale 2 puffs into the lungs every 6 (six) hours as needed for wheezing or shortness of breath.   Marland Kitchen amitriptyline (ELAVIL) 50 MG tablet TAKE 1 TABLET BY MOUTH AT BEDTIME AS NEEDED FOR SLEEP   . atenolol (TENORMIN) 50 MG tablet TAKE 1 TABLET BY MOUTH EVERY DAY   . baclofen (LIORESAL) 10 MG tablet Take one pill 3 to 5 times a day as needed for spasticity   . benzonatate (TESSALON) 100 MG capsule Take 1 capsule (100 mg total) by mouth 2 (two) times daily as needed for cough.   . clonazePAM (KLONOPIN) 1 MG tablet TAKE 1 TABLET  BY MOUTH TWICE A DAY AS NEEDED FOR ANXIETY   . DULoxetine (CYMBALTA) 60 MG capsule Take 1 capsule (60 mg total) by mouth daily.   Dorise Hiss (AIMOVIG) 140 MG/ML SOAJ Inject 140 mg into the skin every 30 (thirty) days.   Marland Kitchen gabapentin (NEURONTIN) 600 MG tablet TAKE 1 TABLET (600 MG TOTAL) BY MOUTH 4 (FOUR) TIMES DAILY.   . hydrocortisone (ANUSOL-HC) 25 MG suppository Place 1 suppository (25 mg total) rectally 2 (two) times daily.   . meclizine (ANTIVERT) 25 MG tablet TAKE 1 TABLET (25 MG TOTAL) BY MOUTH 3 (THREE) TIMES DAILY AS NEEDED FOR DIZZINESS.   Marland Kitchen metroNIDAZOLE (METROCREAM) 0.75 % cream APPLY TO AFFECTED AREA TWICE A DAY   . natalizumab (TYSABRI) 300 MG/15ML injection Inject 300 mg into the vein every 28 (twenty-eight) days. 06/28/2020: JCV ab drawn on 06/24/20 negative, index: 0.15  . pantoprazole (PROTONIX) 40 MG tablet TAKE 1 TABLET BY MOUTH TWICE A DAY   . sucralfate (CARAFATE) 1 g tablet TAKE 1 TABLET BY MOUTH 4 TIMES DAILY - WITH MEALS AND AT BEDTIME.   . SUMAtriptan (IMITREX) 50 MG tablet TAKE 1 TABLET EVERY 2 HRS AS NEEDED FOR MIGRAINE. MAY REPEAT IN 2 HRS IF HEADACHE PERSISTS OR RECURS   . temazepam (RESTORIL) 30 MG capsule TAKE 1 CAPSULE (30 MG TOTAL) BY MOUTH AT BEDTIME AS NEEDED FOR SLEEP (DO NOT TAKE WITH KLONOPIN).    No facility-administered encounter  medications on file as of 01/31/2021.    Patient Active Problem List   Diagnosis Date Noted  . Vertigo of central origin 06/24/2020  . Numbness 06/24/2020  . Panic disorder 11/11/2019  . Current mild episode of major depressive disorder without prior episode (HCC) 11/11/2019  . Snoring 06/20/2018  . Excessive daytime sleepiness 06/20/2018  . Neck pain 01/01/2018  . Palpitations 01/01/2018  . Myalgia 09/03/2017  . Low back pain 09/03/2017  . Right leg pain 07/26/2017  . Bilateral sciatica 07/12/2017  . Nystagmus 02/15/2017  . Spell of altered consciousness 07/19/2016  . Ataxic gait 05/31/2016  . Other fatigue  05/31/2016  . Dysesthesia 05/31/2016  . High risk medication use 03/31/2016  . Insomnia 03/31/2016  . Dizziness   . Syncope   . Multiple sclerosis (HCC) 03/21/2016  . MS (multiple sclerosis) (HCC)   . Vertigo   . White matter abnormality on MRI of brain 03/20/2016  . Faintness     Past Medical History:  Diagnosis Date  . Anxiety   . Headache   . Insomnia   . Multiple sclerosis (HCC)   . Neuromuscular disorder (HCC)    Phreesia 06/30/2020  . Nystagmus 02/15/2017  . Syncope and collapse     Relevant past medical, surgical, family and social history reviewed and updated as indicated. Interim medical history since our last visit reviewed.  Review of Systems Per HPI unless specifically indicated above     Objective:    BP 116/74   Pulse 86   Temp 98.6 F (37 C)   Wt 210 lb 6.4 oz (95.4 kg)   LMP 01/11/2021   SpO2 96%   BMI 36.12 kg/m   Wt Readings from Last 3 Encounters:  01/31/21 210 lb 6.4 oz (95.4 kg)  01/06/21 222 lb (100.7 kg)  12/30/20 224 lb 8 oz (101.8 kg)    Physical Exam Vitals and nursing note reviewed. Exam conducted with a chaperone present (AW).  Constitutional:      General: She is not in acute distress.    Appearance: Normal appearance. She is not toxic-appearing.  HENT:     Head: Normocephalic and atraumatic.  Eyes:     General: No scleral icterus.    Extraocular Movements: Extraocular movements intact.  Abdominal:     General: Abdomen is flat. There is no distension.     Palpations: Abdomen is soft. There is no mass.     Tenderness: There is no right CVA tenderness, left CVA tenderness or guarding.  Genitourinary:    General: Normal vulva.     Exam position: Lithotomy position.     Pubic Area: No rash.      Labia:        Right: No rash, tenderness, lesion or injury.        Left: No rash, tenderness, lesion or injury.      Vagina: No vaginal discharge or prolapsed vaginal walls.     Cervix: No cervical motion tenderness, discharge,  friability, erythema or cervical bleeding.     Uterus: Normal.      Adnexa: Right adnexa normal and left adnexa normal.     Rectum: External hemorrhoid present.  Lymphadenopathy:     Lower Body: No right inguinal adenopathy. No left inguinal adenopathy.  Skin:    General: Skin is warm and dry.     Capillary Refill: Capillary refill takes less than 2 seconds.     Coloration: Skin is not jaundiced or pale.     Findings: No  erythema.  Neurological:     Mental Status: She is alert and oriented to person, place, and time.     Motor: No weakness.     Gait: Gait normal.  Psychiatric:        Mood and Affect: Mood normal.        Behavior: Behavior normal.        Thought Content: Thought content normal.        Judgment: Judgment normal.       Assessment & Plan:  1. Diarrhea, unspecified type Acute x 2 weeks.  Given length of symptoms without relief from Immodium, consider infections etiology vs. Food allergy vs. inflammatory bowel disorder.  Will rule out infectious cause with stool culture, O&P, and C diff testing. Continue with plenty of hydration with water/gatorade in the meantime.  - C. difficile GDH and Toxin A/B; Future - Ova and parasite examination; Future - Stool culture; Future  2. Bleeding hemorrhoid Has tried wipes, creams, and ointments.  Will send in Anusol suppository to use.  With no improvement, refer to GI for further evaluation/management.   - Ambulatory referral to Gastroenterology - hydrocortisone (ANUSOL-HC) 25 MG suppository; Place 1 suppository (25 mg total) rectally 2 (two) times daily.  Dispense: 12 suppository; Refill: 0  3. Cervical cancer screening Pap smear done today.   - PAP,TP IMGw/HPV RNA,rflx [id number]/45     Follow up plan: No follow-ups on file.

## 2021-01-31 NOTE — Telephone Encounter (Signed)
Received fax requesting refill on Restoril.   Ok to refill??  Last office visit 01/31/2021.  Last refill 12/15/2019.

## 2021-02-01 ENCOUNTER — Other Ambulatory Visit: Payer: Self-pay

## 2021-02-01 ENCOUNTER — Other Ambulatory Visit: Payer: BC Managed Care – PPO

## 2021-02-01 DIAGNOSIS — R197 Diarrhea, unspecified: Secondary | ICD-10-CM | POA: Diagnosis not present

## 2021-02-01 MED ORDER — TEMAZEPAM 30 MG PO CAPS: 30.0000 mg | ORAL_CAPSULE | Freq: Every evening | ORAL | 0 refills | Status: DC | PRN

## 2021-02-02 ENCOUNTER — Telehealth: Payer: Self-pay | Admitting: Nurse Practitioner

## 2021-02-02 DIAGNOSIS — R87612 Low grade squamous intraepithelial lesion on cytologic smear of cervix (LGSIL): Secondary | ICD-10-CM

## 2021-02-02 DIAGNOSIS — Z8049 Family history of malignant neoplasm of other genital organs: Secondary | ICD-10-CM

## 2021-02-02 LAB — C. DIFFICILE GDH AND TOXIN A/B
GDH ANTIGEN: NOT DETECTED
MICRO NUMBER:: 11733079
SPECIMEN QUALITY:: ADEQUATE
TOXIN A AND B: NOT DETECTED

## 2021-02-02 LAB — PAP, TP IMAGING W/ HPV RNA, RFLX HPV TYPE 16,18/45: HPV DNA High Risk: DETECTED — AB

## 2021-02-02 NOTE — Telephone Encounter (Signed)
Called patient and discussed lab results.  So far, C diff stool test is negative.  Still waiting for stool culture and O&P.  Pap test came back abnormal - HPV + and LSIL.  Will place referral to OB/GYN for colposcopy per ASCCP guidelines.  Patient in agreement with plan and requests referral to OB/GYN in [place].

## 2021-02-03 ENCOUNTER — Encounter: Payer: Self-pay | Admitting: Nurse Practitioner

## 2021-02-03 LAB — SALMONELLA/SHIGELLA CULT, CAMPY EIA AND SHIGA TOXIN RFL ECOLI
MICRO NUMBER: 11732831
MICRO NUMBER:: 11732832
MICRO NUMBER:: 11732833
Result:: NOT DETECTED
SHIGA RESULT:: NOT DETECTED
SPECIMEN QUALITY: ADEQUATE
SPECIMEN QUALITY:: ADEQUATE
SPECIMEN QUALITY:: ADEQUATE

## 2021-02-07 LAB — OVA AND PARASITE EXAMINATION
CONCENTRATE RESULT:: NONE SEEN
MICRO NUMBER:: 11732829
SPECIMEN QUALITY:: ADEQUATE
TRICHROME RESULT:: NONE SEEN

## 2021-02-09 ENCOUNTER — Other Ambulatory Visit: Payer: Self-pay

## 2021-02-09 ENCOUNTER — Encounter: Payer: Self-pay | Admitting: Nurse Practitioner

## 2021-02-09 DIAGNOSIS — R197 Diarrhea, unspecified: Secondary | ICD-10-CM

## 2021-02-09 NOTE — Telephone Encounter (Signed)
Ok to request GI referral

## 2021-02-16 ENCOUNTER — Telehealth: Payer: Self-pay | Admitting: Family Medicine

## 2021-02-16 DIAGNOSIS — K649 Unspecified hemorrhoids: Secondary | ICD-10-CM

## 2021-02-16 NOTE — Telephone Encounter (Signed)
Received fax from [facility]. pharmacy requesting refill on hydrocortisone (ANUSOL-HC) 25 MG suppository  QTY: 12 ea

## 2021-02-18 MED ORDER — HYDROCORTISONE ACETATE 25 MG RE SUPP: 25.0000 mg | Freq: Two times a day (BID) | RECTAL | 0 refills | Status: DC

## 2021-02-24 ENCOUNTER — Other Ambulatory Visit: Payer: Self-pay | Admitting: *Deleted

## 2021-02-24 ENCOUNTER — Encounter: Payer: BC Managed Care – PPO | Admitting: Obstetrics & Gynecology

## 2021-02-24 DIAGNOSIS — R87612 Low grade squamous intraepithelial lesion on cytologic smear of cervix (LGSIL): Secondary | ICD-10-CM

## 2021-02-24 DIAGNOSIS — Z8049 Family history of malignant neoplasm of other genital organs: Secondary | ICD-10-CM

## 2021-02-28 ENCOUNTER — Other Ambulatory Visit: Payer: Self-pay

## 2021-02-28 ENCOUNTER — Encounter: Payer: Self-pay | Admitting: Obstetrics & Gynecology

## 2021-02-28 ENCOUNTER — Other Ambulatory Visit (HOSPITAL_COMMUNITY)
Admission: RE | Admit: 2021-02-28 | Discharge: 2021-02-28 | Disposition: A | Discharge status: Home / Self Care | Payer: BC Managed Care – PPO | Source: Ambulatory Visit | Attending: Obstetrics & Gynecology | Admitting: Obstetrics & Gynecology

## 2021-02-28 ENCOUNTER — Ambulatory Visit (INDEPENDENT_AMBULATORY_CARE_PROVIDER_SITE_OTHER): Payer: BC Managed Care – PPO | Admitting: Obstetrics & Gynecology

## 2021-02-28 VITALS — BP 120/80 | Ht 64.0 in | Wt 207.0 lb

## 2021-02-28 DIAGNOSIS — R87612 Low grade squamous intraepithelial lesion on cytologic smear of cervix (LGSIL): Secondary | ICD-10-CM | POA: Insufficient documentation

## 2021-02-28 DIAGNOSIS — N871 Moderate cervical dysplasia: Secondary | ICD-10-CM | POA: Diagnosis not present

## 2021-02-28 MED ORDER — JUNEL FE 24 1-20 MG-MCG(24) PO TABS: 1.0000 | ORAL_TABLET | Freq: Every day | ORAL | 3 refills | Status: DC

## 2021-02-28 NOTE — Progress Notes (Signed)
Referring Provider:  Cathlean Marseilles, NP  HPI:  Virginia [name] is a 40 y.o.  G1P1001  who presents today for evaluation and management of abnormal cervical cytology.    Dysplasia History:  LGSIL by recent PAP, w HPV POS    Prior PAP 14 years ago  ROS:  Pertinent items are noted in HPI.  Reports reg cycles (2-3 day length of menstrual flow) w pre- and post menstrual spotting No Birth Control hormones  OB History  Gravida Para Term Preterm AB Living  1 1 1     1   SAB IAB Ectopic Multiple Live Births               # Outcome Date GA Lbr Len/2nd Weight Sex Delivery Anes PTL Lv  1 Term 09/06/97     Vag-Spont       Past Medical History:  Diagnosis Date  . Anxiety   . Headache   . Insomnia   . Multiple sclerosis (HCC)   . Neuromuscular disorder (HCC)    Phreesia 06/30/2020  . Nystagmus 02/15/2017  . Syncope and collapse     History reviewed. No pertinent surgical history.  SOCIAL HISTORY: Social History   Substance and Sexual Activity  Alcohol Use Yes  . Alcohol/week: 0.0 standard drinks   Comment: rarely   Social History   Substance and Sexual Activity  Drug Use No     Family History  Problem Relation Age of Onset  . Emphysema Mother   . Osteoarthritis Mother   . Anxiety disorder Mother   . Uterine cancer Mother 45  . Other Father   . Suicidality Father   . ALS Maternal Grandmother   . Colon cancer Neg Hx   . Colon polyps Neg Hx   . Esophageal cancer Neg Hx   . Rectal cancer Neg Hx   . Stomach cancer Neg Hx     ALLERGIES:  Codeine, Onion, and Topamax [topiramate]  Current Outpatient Medications on File Prior to Visit  Medication Sig Dispense Refill  . albuterol (VENTOLIN HFA) 108 (90 Base) MCG/ACT inhaler Inhale 2 puffs into the lungs every 6 (six) hours as needed for wheezing or shortness of breath. 8 g 0  . amitriptyline (ELAVIL) 50 MG tablet TAKE 1 TABLET BY MOUTH AT BEDTIME AS NEEDED FOR SLEEP 30 tablet 8  . atenolol (TENORMIN) 50 MG tablet TAKE  1 TABLET BY MOUTH EVERY DAY 30 tablet 11  . baclofen (LIORESAL) 10 MG tablet Take one pill 3 to 5 times a day as needed for spasticity 450 tablet 11  . benzonatate (TESSALON) 100 MG capsule Take 1 capsule (100 mg total) by mouth 2 (two) times daily as needed for cough. 20 capsule 0  . clonazePAM (KLONOPIN) 1 MG tablet TAKE 1 TABLET BY MOUTH TWICE A DAY AS NEEDED FOR ANXIETY 20 tablet 1  . DULoxetine (CYMBALTA) 60 MG capsule Take 1 capsule (60 mg total) by mouth daily. 30 capsule 3  . Erenumab-aooe (AIMOVIG) 140 MG/ML SOAJ Inject 140 mg into the skin every 30 (thirty) days. 1.12 mL 5  . gabapentin (NEURONTIN) 600 MG tablet TAKE 1 TABLET (600 MG TOTAL) BY MOUTH 4 (FOUR) TIMES DAILY. 360 tablet 3  . hydrocortisone (ANUSOL-HC) 25 MG suppository Place 1 suppository (25 mg total) rectally 2 (two) times daily. 12 suppository 0  . meclizine (ANTIVERT) 25 MG tablet TAKE 1 TABLET (25 MG TOTAL) BY MOUTH 3 (THREE) TIMES DAILY AS NEEDED FOR DIZZINESS. 30 tablet 0  . metroNIDAZOLE (METROCREAM)  0.75 % cream APPLY TO AFFECTED AREA TWICE A DAY 45 g 0  . natalizumab (TYSABRI) 300 MG/15ML injection Inject 300 mg into the vein every 28 (twenty-eight) days.    . pantoprazole (PROTONIX) 40 MG tablet TAKE 1 TABLET BY MOUTH TWICE A DAY 60 tablet 5  . sucralfate (CARAFATE) 1 g tablet TAKE 1 TABLET BY MOUTH 4 TIMES DAILY - WITH MEALS AND AT BEDTIME. 120 tablet 1  . SUMAtriptan (IMITREX) 50 MG tablet TAKE 1 TABLET EVERY 2 HRS AS NEEDED FOR MIGRAINE. MAY REPEAT IN 2 HRS IF HEADACHE PERSISTS OR RECURS 10 tablet 5  . temazepam (RESTORIL) 30 MG capsule Take 1 capsule (30 mg total) by mouth at bedtime as needed for sleep (do not take with klonopin). 30 capsule 0   No current facility-administered medications on file prior to visit.    Physical Exam: -Vitals:  BP 120/80   Ht 5\' 4"  (1.626 m)   Wt 207 lb (93.9 kg)   LMP 02/17/2021   BMI 35.53 kg/m  GEN: WD, WN, NAD.  A+ O x 3, good mood and affect. ABD:  NT, ND.  Soft, no  masses.  No hernias noted.   Pelvic:   Vulva: Normal appearance.  No lesions.  Vagina: No lesions or abnormalities noted.  Support: Normal pelvic support.  Urethra No masses tenderness or scarring.  Meatus Normal size without lesions or prolapse.  Cervix: See below.  Anus: Normal exam.  No lesions.  Perineum: Normal exam.  No lesions.        Bimanual   Uterus: Normal size.  Non-tender.  Mobile.  AV.  Adnexae: No masses.  Non-tender to palpation.  Cul-de-sac: Negative for abnormality.   PROCEDURE: 1.  Urine Pregnancy Test:  not done 2.  Colposcopy performed with 4% acetic acid after verbal consent obtained                                         -Aceto-white Lesions Location(s): None. Polyp small at os              -Biopsy performed at 6, 12 o'clock               -ECC indicated and performed: Yes.   Polyp removed as well.     -Biopsy sites made hemostatic with pressure, AgNO3, and/or Monsel's solution   -Satisfactory colposcopy: Yes.      -Evidence of Invasive cervical CA :  NO  ASSESSMENT:  Virginia [name] is a 40 y.o. G1P1001 here for  1. LGSIL on Pap smear of cervix   .  PLAN: 1.  I discussed the grading system of pap smears and HPV high risk viral types.  We will discuss and base management after colpo results return. 2. Follow up PAP 6 months, vs intervention if high grade dysplasia identified 3.  Assess change in bleeding after removal of polyp as well as biopsies. 4.  Pt desires birth control. Counseled as to all options. Prefers OCPs  OCPs The risks /benefits of OCPs have been explained to the patient in detail.  Product literature has been given to her.  I have instructed her in the use of OCPs and have given her literature reinforcing this information.  I have explained to the patient that OCPs are not as effective for birth control during the first month of use, and that another form of contraception should  be used during this time.  Both first-day start and Sunday  start have been explained.  The risks and benefits of each was discussed.  She has been made aware of  the fact that other medications may affect the efficacy of OCPs.  I have answered all of her questions, and I believe that she has an understanding of the effectiveness and use of OCPs.      Annamarie Major, MD, [facility], [facility] 02/28/2021  1:36 PM

## 2021-02-28 NOTE — Patient Instructions (Signed)
Thank you for choosing [facility]. As part of our ongoing efforts to improve patient experience, we would appreciate your feedback. Please fill out the short survey that you will receive by mail or MyChart. Your opinion is important to [place]! -Dr Tiburcio Pea  Colposcopy, Care After This sheet gives you information about how to care for yourself after your procedure. Your health care provider may also give you more specific instructions. If you have problems or questions, contact your health care provider. What can I expect after the procedure? If you had a colposcopy without a biopsy, you can expect to feel fine right away after your procedure. However, you may have some spotting of blood for a few days. You can return to your normal activities. If you had a colposcopy with a biopsy, it is common after the procedure to have:  Soreness and mild pain. These may last for a few days.  Light-headedness.  Mild vaginal bleeding or discharge that is dark-colored and grainy. This may last for a few days. The discharge may be caused by a liquid (solution) that was used during the procedure. You may need to wear a sanitary pad during this time.  Spotting of blood for at least 48 hours after the procedure. Follow these instructions at home: Medicines  Take over-the-counter and prescription medicines only as told by your health care provider.  Talk with your health care provider about what type of over-the-counter pain medicine and prescription medicine you can start to take again. It is especially important to talk with your health care provider if you take blood thinners. Activity  Limit your physical activity for the first day after your procedure as told by your health care provider.  Avoid using douche products, using tampons, or having sex for at least 3 days after the procedure or for as long as told.  Return to your normal activities as told by your health care provider. Ask your health care  provider what activities are safe for you. General instructions  Drink enough fluid to keep your urine pale yellow.  Ask your health care provider if you may take baths, swim, or use a hot tub. You may take showers.  If you use birth control (contraception), continue to use it.  Keep all follow-up visits as told by your health care provider. This is important.   Contact a health care provider if:  You develop a skin rash. Get help right away if:  You bleed a lot from your vagina or pass blood clots. This includes using more than one sanitary pad each hour for 2 hours in a row.  You have a fever or chills.  You have vaginal discharge that is abnormal, is yellow in color, or smells bad. This could be a sign of infection.  You have severe pain or cramps in your lower abdomen that do not go away with medicine.  You faint. Summary  If you had a colposcopy without a biopsy, you can expect to feel fine right away, but you may have some spotting of blood for a few days. You can return to your normal activities.  If you had a colposcopy with a biopsy, it is common to have mild pain for a few days and spotting for 48 hours after the procedure.  Avoid using douche products, using tampons, and having sex for at least 3 days after the procedure or for as long as told by your health care provider.  Get help right away if you have  heavy bleeding, severe pain, or signs of infection. This information is not intended to replace advice given to you by your health care provider. Make sure you discuss any questions you have with your health care provider. Document Revised: 10/15/2019 Document Reviewed: 10/15/2019 Elsevier Patient Education  2021 [employer].

## 2021-03-01 ENCOUNTER — Other Ambulatory Visit: Payer: Self-pay | Admitting: *Deleted

## 2021-03-01 DIAGNOSIS — F411 Generalized anxiety disorder: Secondary | ICD-10-CM

## 2021-03-01 NOTE — Telephone Encounter (Signed)
Received fax requesting refill on Clonazepam.   Ok to refill??  Last office visit 02/16/2021.  Last refill 3/124/2022, #1 refills.

## 2021-03-02 LAB — SURGICAL PATHOLOGY

## 2021-03-03 ENCOUNTER — Telehealth: Payer: Self-pay

## 2021-03-03 ENCOUNTER — Telehealth: Payer: Self-pay | Admitting: Obstetrics & Gynecology

## 2021-03-03 DIAGNOSIS — G35 Multiple sclerosis: Secondary | ICD-10-CM | POA: Diagnosis not present

## 2021-03-03 MED ORDER — CLONAZEPAM 1 MG PO TABS
1.0000 mg | ORAL_TABLET | Freq: Two times a day (BID) | ORAL | 1 refills | Status: DC | PRN
Start: 2021-03-03 — End: 2021-04-14

## 2021-03-03 NOTE — Telephone Encounter (Signed)
Friday, 5/20 at 2:30 with RPH.  Pt aware of appt.

## 2021-03-03 NOTE — Progress Notes (Signed)
Sch CRYO to Cervix procedure w PH soon  D/w pt

## 2021-03-03 NOTE — Telephone Encounter (Signed)
-----   Message from [name], MD sent at 03/03/2021  1:29 PM EDT ----- Sch CRYO to Cervix procedure w PH soon  D/w pt

## 2021-03-03 NOTE — Telephone Encounter (Signed)
Called and left voicemail for patient to call back to be scheduled. 

## 2021-03-03 NOTE — Progress Notes (Signed)
LM for pt to call to discuss results  PAP LGSIL, Biopsy w CIN II Rec cryo  Annamarie Major, MD, [facility], [facility] 03/03/2021  8:17 AM

## 2021-03-03 NOTE — Telephone Encounter (Signed)
Pt. Returned call about lab work.

## 2021-03-12 ENCOUNTER — Other Ambulatory Visit: Payer: Self-pay | Admitting: Family Medicine

## 2021-03-14 ENCOUNTER — Other Ambulatory Visit: Payer: Self-pay | Admitting: Family Medicine

## 2021-03-14 DIAGNOSIS — K649 Unspecified hemorrhoids: Secondary | ICD-10-CM

## 2021-03-14 NOTE — Telephone Encounter (Signed)
Ok to refill??  Last office visit 01/06/2021.  Last refill 02/01/2021.  Ok to add refills to prescription?

## 2021-03-16 ENCOUNTER — Encounter: Payer: Self-pay | Admitting: Nurse Practitioner

## 2021-03-17 ENCOUNTER — Encounter: Payer: BC Managed Care – PPO | Admitting: Obstetrics and Gynecology

## 2021-03-18 ENCOUNTER — Other Ambulatory Visit: Payer: Self-pay

## 2021-03-18 ENCOUNTER — Encounter: Payer: Self-pay | Admitting: Obstetrics & Gynecology

## 2021-03-18 ENCOUNTER — Ambulatory Visit (INDEPENDENT_AMBULATORY_CARE_PROVIDER_SITE_OTHER): Payer: BC Managed Care – PPO | Admitting: Obstetrics & Gynecology

## 2021-03-18 VITALS — BP 120/80 | Ht 64.0 in | Wt 205.0 lb

## 2021-03-18 DIAGNOSIS — N871 Moderate cervical dysplasia: Secondary | ICD-10-CM | POA: Insufficient documentation

## 2021-03-18 NOTE — Progress Notes (Signed)
   GYNECOLOGY CLINIC PROCEDURE NOTE  Cryotherapy details  Indication: Pt has a history of CIN 2.  (From Colpo/Bx) Most recent PAP revealed low-grade squamous intraepithelial neoplasia (LGSIL - encompassing HPV,mild dysplasia,CIN I).  The indications for cryotherapy were reviewed with the patient in detail. She was counseled about that efficacy of this procedure, and possible need for excisional procedure in the future if her cervical dysplasia persists.  The risks of the procedure where explained in detail and patient was told to expect a copious amount of discharge in the next few weeks. All her questions were answered, and written informed consent was obtained.  The patient was placed in the dorsal lithotomy position and a vaginal speculum was placed. Her cervix was visualized and patient was noted to have had normal size transformation zone. The appropriate cryotherapy probes were picked and the more endocervical probe was then affixed to cryotherapy apparatus. Then, nitrogen gas was then activated, the probe was applied to the transformation zone of the cervix. This was kept in place for 2 minutes. The cryotherapy was then stopped and all instruments were removed from the patient's pelvis; a thawing period of 2 minutes was observed.  A second cycle of cryotherapy was then administered to the cervix with the more ectocervical probe for 2 minutes.  Then, the probe was removed.  The patient tolerated the procedure well without any complications. Routine post procedure instructions were given to the patient.  Will repeat pap smear in 6 months and manage accordingly.  Annamarie Major, MD, [facility], [facility] 03/18/2021  2:59 PM

## 2021-03-18 NOTE — Patient Instructions (Signed)
Cryoablation Cryoablation is a procedure used to remove abnormal growths or cancerous tissue. This is done by freezing the growth or tissue with either liquid nitrogen or argon gas. This procedure is also known as cryotherapy or cryosurgery. This procedure may be done to treat many conditions, including:  Skin tumors.  Non-cancerous (benign) knots of tissue called nodules.  A type of eye cancer (retinoblastoma).  Cancers of the prostate, liver, kidney, cervix, lung, and bone. Tell a health care provider about:  Any allergies you have.  All medicines you are taking, including vitamins, herbs, eye drops, creams, and over-the-counter medicines.  Any problems you or family members have had with anesthetic medicines.  Any blood disorders you have.  Any surgeries you have had.  Any medical conditions you have.  Whether you are pregnant or may be pregnant. What are the risks? Generally, this is a safe procedure. However, problems may occur, including:  Infection.  Bleeding.  Swelling.  Allergic reactions to medicines.  Damage to nearby structures or organs, such as damage to nerves, which may cause numbness. This is rare. What happens before the procedure? Staying hydrated Follow instructions from your health care provider about hydration, which may include:  Up to 2 hours before the procedure - you may continue to drink clear liquids, such as water, clear fruit juice, black coffee, and plain tea.   Eating and drinking restrictions Follow instructions from your health care provider about eating and drinking, which may include:  8 hours before the procedure - stop eating heavy meals or foods, such as meat, fried foods, or fatty foods.  6 hours before the procedure - stop eating light meals or foods, such as toast or cereal.  6 hours before the procedure - stop drinking milk or drinks that contain milk.  2 hours before the procedure - stop drinking clear  liquids. Medicines Ask your health care provider about:  Changing or stopping your regular medicines. This is especially important if you are taking diabetes medicines or blood thinners.  Taking medicines such as aspirin and ibuprofen. These medicines can thin your blood. Do not take these medicines unless your health care provider tells you to take them.  Taking over-the-counter medicines, vitamins, herbs, and supplements. Tests You may have tests done, including blood tests and imaging tests. General instructions  A medical history will be taken, and a physical exam will be done.  Do not use any products that contain nicotine or tobacco for at least 4 weeks before the procedure. These products include cigarettes, e-cigarettes, and chewing tobacco. If you need help quitting, ask your health care provider.  Ask your health care provider: ? How your surgery site will be marked. ? What steps will be taken to help prevent infection. These may include:  Removing hair at the surgery site.  Washing skin with a germ-killing soap.  Receiving antibiotic medicine.  Plan to have someone take you home from the hospital or clinic.  If you will be going home right after the procedure, plan to have someone with you for 24 hours. What happens during the procedure?  An IV will be inserted into one of your veins.  You will be given one or more of the following: ? A medicine to help you relax (sedative). ? A medicine to numb the area (local anesthetic). ? A medicine to make you fall asleep (general anesthetic). ? A medicine that is injected into your spine to numb the area below and slightly above the injection site (  spinal anesthetic). ? A medicine that is injected into an area of your body to numb everything below the injection site (regional anesthetic).  A device called a cryoprobe will be used to treat the growth by freezing it. The cryoprobe has liquid nitrogen or argon gas flowing  through it. Depending on how deep in the body the growth is found, the cryoprobe can be: ? Applied directly to the area, such as for skin cancers or nodules. ? Inserted through an incision into the area, such as the prostate. ? Passed through a thin, long tube (endoscope) to reach deeper areas of the body, such as the lungs or liver.  The cryoprobe may be guided using imaging, such as an ultrasound, a CT scan, or an MRI.  Liquid nitrogen or argon gas will be delivered through the cryoprobe to the growth until it is frozen and destroyed.  The process may be repeated on other areas depending on how many areas need treatment.  The cryoprobe will be removed, and pressure will be applied to stop any bleeding.  If an incision was made, it will be closed with stitches (sutures) and covered with a bandage (dressing). The procedure will vary depending on the location of the growth. The procedure may also vary among health care providers and hospitals. What happens after the procedure?  Your blood pressure, heart rate, breathing rate, and blood oxygen level will be monitored until you leave the hospital or clinic.  You will be given medicine to help with pain, nausea, and vomiting as needed.  If you were given a sedative during the procedure, it can affect you for several hours. Do not drive or operate machinery until your health care provider says that it is safe.   Summary  Cryoablation is a procedure used to remove abnormal growths or cancerous tissue. This is done by freezing the growth or tissue with either liquid nitrogen or argon gas.  If you will be going home right after the procedure, plan to have someone with you for 24 hours.  Your health care provider may use a device called a cryoprobe that has liquid nitrogen or argon gas flowing through it. Liquid nitrogen or argon gas will be delivered through the cryoprobe to the growth until it is frozen and destroyed.  The process may be  repeated on other areas depending on how many areas need treatment. This information is not intended to replace advice given to you by your health care provider. Make sure you discuss any questions you have with your health care provider. Document Revised: 07/24/2019 Document Reviewed: 07/24/2019 Elsevier Patient Education  2021 [employer] Inc[employer]  

## 2021-04-06 ENCOUNTER — Encounter: Payer: Self-pay | Admitting: Nurse Practitioner

## 2021-04-06 ENCOUNTER — Telehealth: Payer: Self-pay

## 2021-04-06 NOTE — Telephone Encounter (Signed)
Yes, please refer to GI dx. hemorrhoids

## 2021-04-08 NOTE — Telephone Encounter (Signed)
Referral was placed in April 2022. Can you F/U?

## 2021-04-12 ENCOUNTER — Other Ambulatory Visit: Payer: Self-pay | Admitting: Family Medicine

## 2021-04-12 DIAGNOSIS — F411 Generalized anxiety disorder: Secondary | ICD-10-CM

## 2021-04-12 DIAGNOSIS — G35 Multiple sclerosis: Secondary | ICD-10-CM | POA: Diagnosis not present

## 2021-04-13 NOTE — Telephone Encounter (Signed)
Ok to refill??  Last office visit 01/31/2021.  Last refill 03/03/2021.

## 2021-04-18 ENCOUNTER — Other Ambulatory Visit: Payer: Self-pay | Admitting: Family Medicine

## 2021-04-18 DIAGNOSIS — K649 Unspecified hemorrhoids: Secondary | ICD-10-CM

## 2021-04-19 ENCOUNTER — Ambulatory Visit (INDEPENDENT_AMBULATORY_CARE_PROVIDER_SITE_OTHER): Payer: BC Managed Care – PPO | Admitting: Family Medicine

## 2021-04-19 ENCOUNTER — Encounter: Payer: Self-pay | Admitting: Family Medicine

## 2021-04-19 ENCOUNTER — Other Ambulatory Visit: Payer: Self-pay

## 2021-04-19 VITALS — BP 132/82 | HR 100 | Temp 98.0°F | Resp 16 | Ht 64.0 in | Wt 213.0 lb

## 2021-04-19 DIAGNOSIS — M5412 Radiculopathy, cervical region: Secondary | ICD-10-CM

## 2021-04-19 MED ORDER — PREDNISONE 20 MG PO TABS: ORAL_TABLET | ORAL | 0 refills | Status: DC

## 2021-04-19 NOTE — Progress Notes (Signed)
Subjective:    Patient ID: Virginia [name], female    DOB: [birth year], 40 y.o.   MRN: [id number]  HPI   Patient is a 40 year old Caucasian female with history of multiple sclerosis.  She is already taking gabapentin 600 mg 3 times a day as well as baclofen 10 mg 3-5 times a day!  Over the last 2 weeks she has developed severe pain in the right side of her neck.  The pain radiates into her right shoulder blade area.  It also radiates down her right arm into her right hand.  She reports burning stinging pain in her right hand and her right arm but this is a chronic condition due to her MS.  However the pain has intensified.  Today she is tender to palpation all along the right side of the trapezius muscle both in the neck and in the thoracic paraspinal muscle areas as diagrammed.  This elicits the pain and she has palpable muscle spasms in that area.  However she also has a positive Spurling sign to the right.  She has normal reflexes checked at the biceps as well as the brachial radialis.  She has normal grip strength with no obvious muscle deficit.  She denies any falls or neck injury Past Medical History:  Diagnosis Date   Anxiety    Headache    Insomnia    Multiple sclerosis (HCC)    Neuromuscular disorder (HCC)    Phreesia 06/30/2020   Nystagmus 02/15/2017   Syncope and collapse    No past surgical history on file. Current Outpatient Medications on File Prior to Visit  Medication Sig Dispense Refill   albuterol (VENTOLIN HFA) 108 (90 Base) MCG/ACT inhaler Inhale 2 puffs into the lungs every 6 (six) hours as needed for wheezing or shortness of breath. 8 g 0   amitriptyline (ELAVIL) 50 MG tablet TAKE 1 TABLET BY MOUTH AT BEDTIME AS NEEDED FOR SLEEP 30 tablet 8   atenolol (TENORMIN) 50 MG tablet TAKE 1 TABLET BY MOUTH EVERY DAY 30 tablet 11   baclofen (LIORESAL) 10 MG tablet Take one pill 3 to 5 times a day as needed for spasticity 450 tablet 11   benzonatate (TESSALON) 100 MG capsule Take 1  capsule (100 mg total) by mouth 2 (two) times daily as needed for cough. 20 capsule 0   clonazePAM (KLONOPIN) 1 MG tablet TAKE 1 TABLET BY MOUTH 2 TIMES DAILY AS NEEDED FOR ANXIETY. 20 tablet 1   DULoxetine (CYMBALTA) 60 MG capsule Take 1 capsule (60 mg total) by mouth daily. 30 capsule 3   Erenumab-aooe (AIMOVIG) 140 MG/ML SOAJ Inject 140 mg into the skin every 30 (thirty) days. 1.12 mL 5   gabapentin (NEURONTIN) 600 MG tablet TAKE 1 TABLET (600 MG TOTAL) BY MOUTH 4 (FOUR) TIMES DAILY. 360 tablet 3   hydrocortisone (ANUSOL-HC) 25 MG suppository PLACE 1 SUPPOSITORY RECTALLY 2 TIMES DAILY. 12 suppository 0   meclizine (ANTIVERT) 25 MG tablet TAKE 1 TABLET (25 MG TOTAL) BY MOUTH 3 (THREE) TIMES DAILY AS NEEDED FOR DIZZINESS. 30 tablet 0   metroNIDAZOLE (METROCREAM) 0.75 % cream APPLY TO AFFECTED AREA TWICE A DAY 45 g 0   natalizumab (TYSABRI) 300 MG/15ML injection Inject 300 mg into the vein every 28 (twenty-eight) days.     Norethindrone Acetate-Ethinyl Estrad-FE (JUNEL FE 24) 1-20 MG-MCG(24) tablet Take 1 tablet by mouth daily. 84 tablet 3   pantoprazole (PROTONIX) 40 MG tablet TAKE 1 TABLET BY MOUTH TWICE A DAY 60 tablet  5   sucralfate (CARAFATE) 1 g tablet TAKE 1 TABLET BY MOUTH 4 TIMES DAILY - WITH MEALS AND AT BEDTIME. 120 tablet 1   SUMAtriptan (IMITREX) 50 MG tablet TAKE 1 TABLET EVERY 2 HRS AS NEEDED FOR MIGRAINE. MAY REPEAT IN 2 HRS IF HEADACHE PERSISTS OR RECURS 10 tablet 5   temazepam (RESTORIL) 30 MG capsule TAKE 1 CAPSULE (30 MG TOTAL) BY MOUTH AT BEDTIME AS NEEDED FOR SLEEP (DO NOT TAKE WITH KLONOPIN). 30 capsule 3   No current facility-administered medications on file prior to visit.   Allergies  Allergen Reactions   Codeine Other (See Comments)    Hallucinations    Onion    Topamax [Topiramate]     Was talking out of her head, walked into walls   Social History   Socioeconomic History   Marital status: Married    Spouse name: Not on file   Number of children: Not on file    Years of education: Not on file   Highest education level: Not on file  Occupational History   Not on file  Tobacco Use   Smoking status: Former    Pack years: 0.00    Types: E-cigarettes   Smokeless tobacco: Never  Substance and Sexual Activity   Alcohol use: Yes    Alcohol/week: 0.0 standard drinks    Comment: rarely   Drug use: No   Sexual activity: Yes  Other Topics Concern   Not on file  Social History Narrative   Not on file   Social Determinants of Health   Financial Resource Strain: Not on file  Food Insecurity: Not on file  Transportation Needs: Not on file  Physical Activity: Not on file  Stress: Not on file  Social Connections: Not on file  Intimate Partner Violence: Not on file      Review of Systems     Objective:   Physical Exam Constitutional:      Appearance: Normal appearance. She is obese.  Cardiovascular:     Rate and Rhythm: Normal rate and regular rhythm.     Heart sounds: Normal heart sounds.  Pulmonary:     Effort: Pulmonary effort is normal. No respiratory distress.     Breath sounds: Normal breath sounds. No stridor. No wheezing.  Abdominal:     General: Abdomen is flat. Bowel sounds are normal.     Palpations: Abdomen is soft.  Musculoskeletal:     Right shoulder: Tenderness present. No swelling, deformity, effusion, bony tenderness or crepitus. Decreased range of motion. Normal strength.     Cervical back: Spasms and tenderness present. No bony tenderness. Decreased range of motion.     Thoracic back: Spasms and tenderness present. No bony tenderness.       Back:  Neurological:     General: No focal deficit present.     Mental Status: She is alert and oriented to person, place, and time. Mental status is at baseline.     Cranial Nerves: No cranial nerve deficit.        Assessment & Plan:  Cervical radiculopathy Patient reports cervical radiculopathy as well as compensatory muscle spasms in the right trapezius muscle.   Begin prednisone taper pack in addition to her gabapentin baclofen.  Reassess in 1 week if no better or sooner if worsening.  Would likely need an MRI of the cervical spine if worsening

## 2021-04-25 ENCOUNTER — Encounter: Payer: Self-pay | Admitting: Family Medicine

## 2021-04-25 ENCOUNTER — Other Ambulatory Visit: Payer: Self-pay | Admitting: Nurse Practitioner

## 2021-04-25 DIAGNOSIS — M5412 Radiculopathy, cervical region: Secondary | ICD-10-CM

## 2021-04-25 MED ORDER — PREDNISONE 10 MG PO TABS: ORAL_TABLET | ORAL | 0 refills | Status: DC

## 2021-05-04 ENCOUNTER — Encounter: Payer: Self-pay | Admitting: Family Medicine

## 2021-05-04 MED ORDER — NIRMATRELVIR/RITONAVIR (PAXLOVID)TABLET: 3.0000 | ORAL_TABLET | Freq: Two times a day (BID) | ORAL | 0 refills | Status: AC

## 2021-05-04 NOTE — Telephone Encounter (Signed)
Call placed to patient.   Patient tested positive for COVID on 05/04/2021.   Sx include HA, cough, sore throat, fatigue, nausea.  Advised to continue symptom management with OTC medications: Tylenol/ Ibuprofen for fever/ body aches, Mucinex/ Delsym for cough/ chest congestion, Afrin/Sudafed/nasal saline for sinus pressure/ nasal congestion  GFR 80. Order for Paxlovid sent to pharmacy. Advised possible SE include nausea, diarrhea, loss of taste and hypertension.  If chest pain, shortness of breath, fever >104 that is unresponsive to antipyretics noted, advised to go to ER for evaluation.   Advised that the CDC recommends the following criteria prior to ending isolation in vaccinated persons at least 5 days since symptoms onset, then an additional 5 days masking.  AND 3 days fever free without antipyretics (Tylenol or Ibuprofen) AND improvement in respiratory symptoms.

## 2021-05-07 ENCOUNTER — Other Ambulatory Visit: Payer: Self-pay | Admitting: Family Medicine

## 2021-05-09 ENCOUNTER — Ambulatory Visit: Payer: BC Managed Care – PPO | Admitting: Physician Assistant

## 2021-05-13 ENCOUNTER — Other Ambulatory Visit: Payer: Self-pay

## 2021-05-13 ENCOUNTER — Ambulatory Visit (HOSPITAL_COMMUNITY)
Admission: RE | Admit: 2021-05-13 | Discharge: 2021-05-13 | Disposition: A | Discharge status: Home / Self Care | Payer: BC Managed Care – PPO | Source: Ambulatory Visit | Attending: Nurse Practitioner | Admitting: Nurse Practitioner

## 2021-05-13 ENCOUNTER — Other Ambulatory Visit: Payer: Self-pay | Admitting: Family Medicine

## 2021-05-13 DIAGNOSIS — M5412 Radiculopathy, cervical region: Secondary | ICD-10-CM | POA: Diagnosis not present

## 2021-05-13 DIAGNOSIS — M50323 Other cervical disc degeneration at C6-C7 level: Secondary | ICD-10-CM | POA: Diagnosis not present

## 2021-05-13 DIAGNOSIS — M50221 Other cervical disc displacement at C4-C5 level: Secondary | ICD-10-CM | POA: Diagnosis not present

## 2021-05-13 DIAGNOSIS — R200 Anesthesia of skin: Secondary | ICD-10-CM | POA: Diagnosis not present

## 2021-05-13 DIAGNOSIS — M4802 Spinal stenosis, cervical region: Secondary | ICD-10-CM | POA: Diagnosis not present

## 2021-05-13 MED ORDER — GADOBUTROL 1 MMOL/ML IV SOLN
9.5000 mL | Freq: Once | INTRAVENOUS | Status: AC | PRN
  Administered 2021-05-13: 9.5 mL via INTRAVENOUS

## 2021-05-16 ENCOUNTER — Other Ambulatory Visit: Payer: Self-pay

## 2021-05-16 DIAGNOSIS — M4802 Spinal stenosis, cervical region: Secondary | ICD-10-CM

## 2021-05-16 DIAGNOSIS — G35 Multiple sclerosis: Secondary | ICD-10-CM | POA: Diagnosis not present

## 2021-05-20 ENCOUNTER — Encounter: Payer: Self-pay | Admitting: Family Medicine

## 2021-05-20 MED ORDER — DULOXETINE HCL 60 MG PO CPEP: 60.0000 mg | ORAL_CAPSULE | Freq: Every day | ORAL | 3 refills | Status: DC

## 2021-06-08 ENCOUNTER — Encounter: Payer: Self-pay | Admitting: Physician Assistant

## 2021-06-08 ENCOUNTER — Ambulatory Visit (INDEPENDENT_AMBULATORY_CARE_PROVIDER_SITE_OTHER): Payer: BC Managed Care – PPO | Admitting: Physician Assistant

## 2021-06-08 VITALS — BP 124/82 | HR 91 | Ht 64.0 in | Wt 226.0 lb

## 2021-06-08 DIAGNOSIS — K625 Hemorrhage of anus and rectum: Secondary | ICD-10-CM | POA: Diagnosis not present

## 2021-06-08 DIAGNOSIS — K219 Gastro-esophageal reflux disease without esophagitis: Secondary | ICD-10-CM

## 2021-06-08 DIAGNOSIS — K602 Anal fissure, unspecified: Secondary | ICD-10-CM | POA: Diagnosis not present

## 2021-06-08 MED ORDER — DILTIAZEM GEL 2 %: 1.0000 "application " | Freq: Three times a day (TID) | CUTANEOUS | 1 refills | Status: DC

## 2021-06-08 NOTE — Progress Notes (Signed)
Chief Complaint: Blood in stool and GERD  HPI:    Virginia [name] is a 40 year old female, known to Dr. Myrtie Neither, with a past medical history of anxiety and others listed below, who was referred to me by [name], MD for a complaint of blood in stools.      07/14/2019 EGD for epigastric abdominal pain and nausea with granular mucosa in the esophagus and otherwise normal.  Biopsies normal.  Patient was then scheduled for a CT his abdomen pelvis.     07/30/2019 CT abdomen pelvis with contrast with small amount of contrast meeting the distal esophagus suggesting dysmotility or GERD.  No other specific cause for the patient's symptoms identified.  Mild impingement of L5-S1.  That time is suggested that possibly her epigastric pain and nausea were caused by Tysabri or there may be some anxiety component.  At that time suggested Zofran or IBgard or ginger chews.    Today, the patient presents to clinic and tells me that she has "bad hemorrhoids".  Tells me they have been bad for about 6 months with some pain when she defecates as well as bright red blood in the toilet when she has a stool and itching.  She has tried everything she can think of including gels, creams and most recently Hydrocortisone suppositories twice a day but nothing is really seeming to help.  Did have some diarrhea back in April but this has since cleared up.    Also discusses some breakthrough reflux symptoms regardless of her Pantoprazole 40 mg twice daily and Carafate for breakthrough.    Denies fever, chills, weight loss, abdominal pain, nausea, vomiting or change in bowel habits.  Past Medical History:  Diagnosis Date   Anxiety    Headache    Insomnia    Multiple sclerosis (HCC)    Neuromuscular disorder (HCC)    Phreesia 06/30/2020   Nystagmus 02/15/2017   Syncope and collapse     History reviewed. No pertinent surgical history.  Current Outpatient Medications  Medication Sig Dispense Refill   AIMOVIG 140 MG/ML SOAJ  INJECT 140 MG INTO THE SKIN EVERY 30 (THIRTY) DAYS. 1 mL 5   albuterol (VENTOLIN HFA) 108 (90 Base) MCG/ACT inhaler Inhale 2 puffs into the lungs every 6 (six) hours as needed for wheezing or shortness of breath. 8 g 0   amitriptyline (ELAVIL) 50 MG tablet TAKE 1 TABLET BY MOUTH AT BEDTIME AS NEEDED FOR SLEEP 30 tablet 8   atenolol (TENORMIN) 50 MG tablet TAKE 1 TABLET BY MOUTH EVERY DAY 30 tablet 11   baclofen (LIORESAL) 10 MG tablet Take one pill 3 to 5 times a day as needed for spasticity 450 tablet 11   benzonatate (TESSALON) 100 MG capsule Take 1 capsule (100 mg total) by mouth 2 (two) times daily as needed for cough. 20 capsule 0   clonazePAM (KLONOPIN) 1 MG tablet TAKE 1 TABLET BY MOUTH 2 TIMES DAILY AS NEEDED FOR ANXIETY. 20 tablet 1   DULoxetine (CYMBALTA) 60 MG capsule Take 1 capsule (60 mg total) by mouth daily. 30 capsule 3   gabapentin (NEURONTIN) 600 MG tablet TAKE 1 TABLET (600 MG TOTAL) BY MOUTH 4 (FOUR) TIMES DAILY. 360 tablet 3   hydrocortisone (ANUSOL-HC) 25 MG suppository PLACE 1 SUPPOSITORY RECTALLY 2 TIMES DAILY. 12 suppository 0   meclizine (ANTIVERT) 25 MG tablet TAKE 1 TABLET (25 MG TOTAL) BY MOUTH 3 (THREE) TIMES DAILY AS NEEDED FOR DIZZINESS. 30 tablet 0   metroNIDAZOLE (METROCREAM) 0.75 %  cream APPLY TO AFFECTED AREA TWICE A DAY 45 g 0   natalizumab (TYSABRI) 300 MG/15ML injection Inject 300 mg into the vein every 28 (twenty-eight) days.     Norethindrone Acetate-Ethinyl Estrad-FE (JUNEL FE 24) 1-20 MG-MCG(24) tablet Take 1 tablet by mouth daily. 84 tablet 3   pantoprazole (PROTONIX) 40 MG tablet TAKE 1 TABLET BY MOUTH TWICE A DAY 60 tablet 5   predniSONE (DELTASONE) 10 MG tablet Take 6 pills days 1-2, decrease by 1 pill every 2 days until gone. 42 tablet 0   sucralfate (CARAFATE) 1 g tablet TAKE 1 TABLET BY MOUTH 4 TIMES DAILY - WITH MEALS AND AT BEDTIME. 120 tablet 1   SUMAtriptan (IMITREX) 50 MG tablet TAKE 1 TABLET EVERY 2 HRS AS NEEDED FOR MIGRAINE. MAY REPEAT IN 2  HRS IF HEADACHE PERSISTS OR RECURS 10 tablet 5   temazepam (RESTORIL) 30 MG capsule TAKE 1 CAPSULE (30 MG TOTAL) BY MOUTH AT BEDTIME AS NEEDED FOR SLEEP (DO NOT TAKE WITH KLONOPIN). 30 capsule 3   No current facility-administered medications for this visit.    Allergies as of 06/08/2021 - Review Complete 06/08/2021  Allergen Reaction Noted   Codeine Other (See Comments) 10/25/2015   Onion  08/01/2019   Topamax [topiramate]  08/31/2020    Family History  Problem Relation Age of Onset   Emphysema Mother    Osteoarthritis Mother    Anxiety disorder Mother    Uterine cancer Mother 71   Other Father    Suicidality Father    ALS Maternal Grandmother    Colon cancer Neg Hx    Colon polyps Neg Hx    Esophageal cancer Neg Hx    Rectal cancer Neg Hx    Stomach cancer Neg Hx     Social History   Socioeconomic History   Marital status: Married    Spouse name: Not on file   Number of children: Not on file   Years of education: Not on file   Highest education level: Not on file  Occupational History   Not on file  Tobacco Use   Smoking status: Former    Types: E-cigarettes   Smokeless tobacco: Never  Substance and Sexual Activity   Alcohol use: Yes    Alcohol/week: 0.0 standard drinks    Comment: rarely   Drug use: No   Sexual activity: Yes  Other Topics Concern   Not on file  Social History Narrative   Not on file   Social Determinants of Health   Financial Resource Strain: Not on file  Food Insecurity: Not on file  Transportation Needs: Not on file  Physical Activity: Not on file  Stress: Not on file  Social Connections: Not on file  Intimate Partner Violence: Not on file    Review of Systems:    Constitutional: No weight loss, fever or chills Cardiovascular: No chest pain   Respiratory: No SOB Gastrointestinal: See HPI and otherwise negative   Physical Exam:  Vital signs: BP 124/82   Pulse 91   Ht 5\' 4"  (1.626 m)   Wt 226 lb (102.5 kg)   SpO2 98%    BMI 38.79 kg/m   Constitutional:   Pleasant overweight Caucasian female appears to be in NAD, Well developed, Well nourished, alert and cooperative Respiratory: Respirations even and unlabored. Lungs clear to auscultation bilaterally.   No wheezes, crackles, or rhonchi.  Cardiovascular: Normal S1, S2. No MRG. Regular rate and rhythm. No peripheral edema, cyanosis or pallor.  Gastrointestinal:  Soft, nondistended, nontender. No rebound or guarding. Normal bowel sounds. No appreciable masses or hepatomegaly. Rectal: External: Large tag with fissure/skin tear behind the tag very tender to palpation; internal: No mass Psychiatric: Demonstrates good judgement and reason without abnormal affect or behaviors.  No recent labs or imaging.  Assessment: 1.  Skin tear/fissure: Seen today on exam behind a large external skin tag, very tender 2.  Rectal bleeding: Likely with above 3.  GERD: Some breakthrough symptoms 3 days a week; consider functional with known anxiety+/- gastritis+/- diet  Plan: 1.  Did have Dr. Tomasa Rand visualize the rectal exam at time of appointment today.  Confirmed patient has a skin tear/fissure behind a large external skin tag. 2.  Prescribe Diltiazem to be applied 3 times daily x6-8 weeks 3.  Discussed RectiCare lidocaine as needed for pain 4.  Recommend the patient keep her stools soft solid with no straining 5.  Discussed gentle wiping with [place] wet wipes and dab dry 6.  Discussed with patient that if she does not have improvement in symptoms over the next 2 months then she may need to see the surgeons for further evaluation. 7.  Recommend the patient discontinue suppositories. 8.  Discussed that the patient should discontinue Carafate as it sounds like she is taking this incorrectly as far as timing, possibly prohibiting absorption of her Pantoprazole.  Recommend that she use Pepcid 20 mg as needed for breakthrough instead.  If this is not helping or she gets more  frequent reflux we may need to schedule Pepcid as well as Pantoprazole. 9.  Patient to follow in clinic with me in 2 months or sooner if necessary.  Hyacinth Meeker, PA-C [facility] 06/08/2021, 11:41 AM  Cc: [name], MD

## 2021-06-08 NOTE — Patient Instructions (Signed)
We have sent the following medications to your pharmacy for you to pick up at your convenience: Continue Pantoprazole 40 mg twice daily.   Please purchase the following medications over the counter and take as directed: Pepcid 20 mg as needed for breakthrough symptoms. Reticare w/Lidocaine as needed.  We have sent a prescription for Diltiazem 2% gel to [facility] for you. Using your index finger, you should apply a small amount of medication inside the rectum up to your first knuckle/joint three times daily x 6-8 weeks.  [facility]'s information is below: Address: [address] [place], [place] [address]  [phone]  *Please DO NOT go directly from our office to pick up this medication! Give the pharmacy 1 day to process the prescription as this is compounded and takes time to make.  If you are age 41 or older, your body mass index should be between 23-30. Your Body mass index is 38.79 kg/m. If this is out of the aforementioned range listed, please consider follow up with your Primary Care Provider.  If you are age 62 or younger, your body mass index should be between 19-25. Your Body mass index is 38.79 kg/m. If this is out of the aformentioned range listed, please consider follow up with your Primary Care Provider.   __________________________________________________________  The [facility] providers would like to encourage you to use [facility] to communicate with providers for non-urgent requests or questions.  Due to long hold times on the telephone, sending your provider a message by [facility] may be a faster and more efficient way to get a response.  Please allow 48 business hours for a response.  Please remember that this is for non-urgent requests.

## 2021-06-10 NOTE — Progress Notes (Signed)
____________________________________________________________  Attending physician addendum:  Thank you for sending this case to me. I have reviewed the entire note and agree with the plan.  At least a half-cap of miralax in water daily as well when there is an anal fissure.  This fissure sounds like it has been going on many months and may therefore take months to heal.  Amada Jupiter, MD  ____________________________________________________________

## 2021-06-13 DIAGNOSIS — G35 Multiple sclerosis: Secondary | ICD-10-CM | POA: Diagnosis not present

## 2021-06-16 DIAGNOSIS — M5412 Radiculopathy, cervical region: Secondary | ICD-10-CM | POA: Diagnosis not present

## 2021-06-16 DIAGNOSIS — M542 Cervicalgia: Secondary | ICD-10-CM | POA: Diagnosis not present

## 2021-07-07 ENCOUNTER — Ambulatory Visit (INDEPENDENT_AMBULATORY_CARE_PROVIDER_SITE_OTHER): Payer: BC Managed Care – PPO | Admitting: Family Medicine

## 2021-07-07 ENCOUNTER — Telehealth: Payer: Self-pay | Admitting: *Deleted

## 2021-07-07 ENCOUNTER — Encounter: Payer: Self-pay | Admitting: Family Medicine

## 2021-07-07 VITALS — BP 112/82 | HR 80 | Ht 64.0 in | Wt 228.0 lb

## 2021-07-07 DIAGNOSIS — Z79899 Other long term (current) drug therapy: Secondary | ICD-10-CM | POA: Diagnosis not present

## 2021-07-07 DIAGNOSIS — R208 Other disturbances of skin sensation: Secondary | ICD-10-CM | POA: Diagnosis not present

## 2021-07-07 DIAGNOSIS — R5383 Other fatigue: Secondary | ICD-10-CM | POA: Diagnosis not present

## 2021-07-07 DIAGNOSIS — G35 Multiple sclerosis: Secondary | ICD-10-CM

## 2021-07-07 DIAGNOSIS — G8929 Other chronic pain: Secondary | ICD-10-CM

## 2021-07-07 DIAGNOSIS — G4700 Insomnia, unspecified: Secondary | ICD-10-CM

## 2021-07-07 DIAGNOSIS — H814 Vertigo of central origin: Secondary | ICD-10-CM

## 2021-07-07 DIAGNOSIS — M545 Low back pain, unspecified: Secondary | ICD-10-CM

## 2021-07-07 MED ORDER — MECLIZINE HCL 25 MG PO TABS: 25.0000 mg | ORAL_TABLET | Freq: Three times a day (TID) | ORAL | 0 refills | Status: DC | PRN

## 2021-07-07 NOTE — Patient Instructions (Addendum)
Below is our plan:  We will continue current treatment plan. I will update labs, today. Please try to focus on regular activity and sleep hygiene. Consider regular exercise regimen. We will refill meclizine take 1/2 tablet to see if this helps. May take full tablet if needed.   Please make sure you are staying well hydrated. I recommend 50-60 ounces daily. Well balanced diet and regular exercise encouraged. Consistent sleep schedule with 6-8 hours recommended.   Please continue follow up with care team as directed.   Follow up with [name] in 6 months    You may receive a survey regarding today's visit. I encourage you to leave honest feed back as I do use this information to improve patient care. Thank you for seeing me today!

## 2021-07-07 NOTE — Progress Notes (Addendum)
Chief Complaint  Patient presents with   Follow-up    RM 6 alone here for 6 month f/u- Pt reports she has not been doing- dizziness has been an issue for her since her last visit. Reports 1 dizzy spell per week. N/V has been associated. Migraines have not been well controled. Reports 5 migraines per month lasting 1 -2 days. Reports she has not been using the aimovig consistently last injectuon the beginning of august missed a few dosages prior to that. Sumatriptan does provide some relief.  On Tysabri last infusion in August.      HISTORY OF PRESENT ILLNESS: 07/07/21 ALL:  Virginia [name] is a 40 y.o. female here today for follow up for RRMS. She continues Tysabri infusions, last infusion in August. Last JCV 0.25, negative assay. MRI brain 12/2019 stable.   Gait is stable. No falls. She walks her dog everyday for about 5-10 minutes. She feels that balance is stable. She reports that she is having dizzy spells. She reports this started 2 months ago. Occurs about once a week. She reports feeling that "everything is spinning". Has vomited twice while having an event. She has had similar events in the past that was treated with meclizine and that really helps her. No vision changes.   Pain is stable. She continues baclofen 10mg  3-5 times daily and gabapentin 600mg  TID  She was referred to NS by PCP following cervical MRI showing severe foraminal stenosis at C6-C7.   Headaches are worse. She admits that she has not used Amovig (started in 03/2021 by PCP) regularly but does feel it was helping. She is having 4-5 migraines per month. Sumatriptan does help abort migraine.   Sleep is decent. Sleep time varies. She usually goes to bed around 3am and wakes around 9-10am. She is taking temazepam and amitriptyline every night. Mood is stable on clonazepam and duloxetine. These medications managed by PCP.    HISTORY (copied from Dr Bonnita Hollow previous note)  Virginia [name] is a 40 y.o. woman with relapsing  remitting multiple sclerosis who also has back pain.   Update 12/30/2020: She is on Tysabri 300 mg q4 weeks.  Last JCV Ab was 12/28/2020 and is pending.   MRI of the brain 01/20/2020 showed scattered T2/FLAIR hyperintense foci in the hemispheres and medulla in a pattern and configuration consistent with chronic demyelinating plaque associated with multiple sclerosis.   Compared to the MRI from 01/17/2019, there are no new lesions.   She has more headaches (daily), usually starting on the right but holocephalic with pulsating quality when it intensifies (2-3 times a week).   She was prescribed Aimovig and has taken twice.   It only helps a little bit.    Topiramate caused cognitive chages.   Sumatriptan (usually a second one 2 hours later often helps).   She has been on amitriptyline in the past    She is noting more fatigue.   She notes sleep is poor at night.    Mood is fine.      Gait is about the same with mildly reduced balance.   She stumbles but no recent falls.  She has burning in her legs, right > left helped some by gabapentin 600 mg po qid.   She is noting more muscle cramps in her legs, esp calves.     She has occasional vertigo spells, triggered by Movements.   She denies hearing loss.   Meclizine helps some but makes her sleepy.  Bladder function is  fine.     She has low back pain, worse when she stands.  Sometimes she gets pain in the feet as well.  She has a disc herniation at L5-S1.  She also has neck pain but not arm pain.     She had Covid-19 in January 2022.   She had one vaccine when it occurred and had to delay the second one.     MS History:   In mid May 2017, she was starting to experience occasional lightheadedness and a spinning vertigo. On 03/19/2016, she had more extreme vertigo and had an episode of syncope. She notices that when she was walking she would veer towards the right. Because of the syncope, she was taken to the emergency room. He had an MRI of the brain that showed  several enhancing lesions, potentially worrisome for multiple sclerosis. Additional studies were performed. She received 3 days of IV steroids. The MRI's of the spine did not show any additional plaques. The lumbar puncture showed oligoclonal bands with normal IgG index, consistent with multiple sclerosis.   She started  Aubagio.   MRI in 2018 showed additional foci plus she had tolerability issues.   She has been on Tysabri since 12/26/2016   LABS/LP She underwent a lumbar puncture on 03/21/2016. 4 oligoclonal bands present in the CSF which were not present in the serum, consistent with multiple sclerosis. The IgG index was high normal at 0.6.   There were only 4 white blood cells but 280 red blood cells more consistent with a slightly traumatic tap.    [facility] Spotted Fever CSF IgG was negative but the IgM was positive at 1.41 (less than 0.9 normal).    ANA and ANCA were both negative.   IMAGING MRI images from 03/20/2016, 03/22/2016 and CT angiogram images from 03/23/2016 reviewed: The MRI of the brain shows several 2/fair hyperintense foci, some in the periventricular and juxtacortical white matter. Most of the foci enhanced after contrast administration. MRI of the cervical and thoracic spine did not show any spinal cord plaques and there was no significant degenerative change.   CT angiogram was essentially normal showing no significant stenosis.   MRI 11/28/2016 showed multiple periventricular, subcortical, pericallosal as well as craniovertebral junction white matter hyperintensities compatible with multiple sclerosis. Multiple enhancing lesions are noted in the periventricular regions with the largest one in the left posterior frontal subcortical white matter. Incidental large venous angioma is noted in the right parietal region. Compared with MRI scan dated 03/20/2016 there are several new enhancing as well as nonenhancing white matter lesions.   MRI 12/25/2016 of the cervical spine showed  two T2 hyperintense foci within the left posterolateral spinal cord, one adjacent to C6-C7 and one adjacent to T1. There may have been subtle signal within the spinal cord adjacent to C6-C7 on the previous scan but the current focus is clearly larger. The focus adjacent to T1 was not evident on the previous MRI. Neither of these 2 foci enhanced with contrast.         There is mild spinal stenosis at C4-C5, C5-C6 and C6-C7, unchanged when compared to the previous MRI. There is mild right foraminal narrowing at C6-C7 but there is no nerve root compression.   MRI 01/10/2018 of the brain showed multiple T2/FLAIR hyperintense foci in the brainstem and in the periventricular, juxtacortical and deep white matter in a pattern and configuration consistent with chronic demyelinating plaque associated with multiple sclerosis.  Compared to the MRI dated  11/28/2016, there is an improved appearance in the lesions that were enhancing at that time no longer do so.  There are no new lesions.   MRI [facility] 01/27/2018 showed, at L4-L5, there is stable mild left facet hypertrophy and mild ligamentum flavum hypertrophy.  There is no nerve root compression.     At L5-S1, there is a stable small central disc herniation causing bilateral lateral recess narrowing.  This contacts both of the S1 nerve roots.  There is no definite change when compared to the previous MRI.   MRI brain 01/17/2019 showed multiple T2/flair hyperintense foci in the hemispheres in a pattern and configuration consistent with chronic demyelinating plaque associated with multiple sclerosis.  Two foci that have been seen previously in the brainstem are not apparent on the current MRI.  There are no new lesions compared to the 2019 MRI.  None of the foci enhance.    There is a small developmental venous anomaly in the right parietal lobe that is unchanged in appearance.  Otherwise, the enhancement pattern is normal.   MRI of the brain 01/20/2020 showed scattered  T2/FLAIR hyperintense foci in the hemispheres and medulla in a pattern and configuration consistent with chronic demyelinating plaque associated with multiple sclerosis.   Compared to the MRI from 01/17/2019, there are no new lesions.   REVIEW OF SYSTEMS: Out of a complete 14 system review of symptoms, the patient complains only of the following symptoms, headaches, chronic pain, depression, muscle cramps, fatigue, insomnia, dizziness and all other reviewed systems are negative.   ALLERGIES: Allergies  Allergen Reactions   Codeine Other (See Comments)    Hallucinations    Onion    Topamax [Topiramate]     Was talking out of her head, walked into walls     HOME MEDICATIONS: Outpatient Medications Prior to Visit  Medication Sig Dispense Refill   AIMOVIG 140 MG/ML SOAJ INJECT 140 MG INTO THE SKIN EVERY 30 (THIRTY) DAYS. 1 mL 5   amitriptyline (ELAVIL) 50 MG tablet TAKE 1 TABLET BY MOUTH AT BEDTIME AS NEEDED FOR SLEEP 30 tablet 8   atenolol (TENORMIN) 50 MG tablet TAKE 1 TABLET BY MOUTH EVERY DAY 30 tablet 11   baclofen (LIORESAL) 10 MG tablet Take one pill 3 to 5 times a day as needed for spasticity 450 tablet 11   clonazePAM (KLONOPIN) 1 MG tablet TAKE 1 TABLET BY MOUTH 2 TIMES DAILY AS NEEDED FOR ANXIETY. 20 tablet 1   diltiazem 2 % GEL Apply 1 application topically 3 (three) times daily. 30 g 1   DULoxetine (CYMBALTA) 60 MG capsule Take 1 capsule (60 mg total) by mouth daily. 30 capsule 3   gabapentin (NEURONTIN) 600 MG tablet TAKE 1 TABLET (600 MG TOTAL) BY MOUTH 4 (FOUR) TIMES DAILY. 360 tablet 3   natalizumab (TYSABRI) 300 MG/15ML injection Inject 300 mg into the vein every 28 (twenty-eight) days.     Norethindrone Acetate-Ethinyl Estrad-FE (JUNEL FE 24) 1-20 MG-MCG(24) tablet Take 1 tablet by mouth daily. 84 tablet 3   pantoprazole (PROTONIX) 40 MG tablet TAKE 1 TABLET BY MOUTH TWICE A DAY 60 tablet 5   sucralfate (CARAFATE) 1 g tablet TAKE 1 TABLET BY MOUTH 4 TIMES DAILY - WITH  MEALS AND AT BEDTIME. 120 tablet 1   SUMAtriptan (IMITREX) 50 MG tablet TAKE 1 TABLET EVERY 2 HRS AS NEEDED FOR MIGRAINE. MAY REPEAT IN 2 HRS IF HEADACHE PERSISTS OR RECURS 10 tablet 5   temazepam (RESTORIL) 30 MG capsule TAKE 1  CAPSULE (30 MG TOTAL) BY MOUTH AT BEDTIME AS NEEDED FOR SLEEP (DO NOT TAKE WITH KLONOPIN). 30 capsule 3   meclizine (ANTIVERT) 25 MG tablet TAKE 1 TABLET (25 MG TOTAL) BY MOUTH 3 (THREE) TIMES DAILY AS NEEDED FOR DIZZINESS. 30 tablet 0   hydrocortisone (ANUSOL-HC) 25 MG suppository PLACE 1 SUPPOSITORY RECTALLY 2 TIMES DAILY. (Patient not taking: Reported on 07/07/2021) 12 suppository 0   No facility-administered medications prior to visit.     PAST MEDICAL HISTORY: Past Medical History:  Diagnosis Date   Anxiety    Headache    Insomnia    Multiple sclerosis (HCC)    Neuromuscular disorder (HCC)    Phreesia 06/30/2020   Nystagmus 02/15/2017   Syncope and collapse      PAST SURGICAL HISTORY: History reviewed. No pertinent surgical history.   FAMILY HISTORY: Family History  Problem Relation Age of Onset   Emphysema Mother    Osteoarthritis Mother    Anxiety disorder Mother    Uterine cancer Mother 108   Other Father    Suicidality Father    ALS Maternal Grandmother    Colon cancer Neg Hx    Colon polyps Neg Hx    Esophageal cancer Neg Hx    Rectal cancer Neg Hx    Stomach cancer Neg Hx      SOCIAL HISTORY: Social History   Socioeconomic History   Marital status: Married    Spouse name: Not on file   Number of children: Not on file   Years of education: Not on file   Highest education level: Not on file  Occupational History   Not on file  Tobacco Use   Smoking status: Former    Types: E-cigarettes   Smokeless tobacco: Never  Substance and Sexual Activity   Alcohol use: Yes    Alcohol/week: 0.0 standard drinks    Comment: rarely   Drug use: No   Sexual activity: Yes  Other Topics Concern   Not on file  Social History Narrative    Not on file   Social Determinants of Health   Financial Resource Strain: Not on file  Food Insecurity: Not on file  Transportation Needs: Not on file  Physical Activity: Not on file  Stress: Not on file  Social Connections: Not on file  Intimate Partner Violence: Not on file     PHYSICAL EXAM  Vitals:   07/07/21 1507  BP: 112/82  Pulse: 80  SpO2: 97%  Weight: 228 lb (103.4 kg)  Height: 5\' 4"  (1.626 m)   Body mass index is 39.14 kg/m.   Generalized: Well developed, in no acute distress  Cardiology: normal rate and rhythm, no murmur auscultated  Respiratory: clear to auscultation bilaterally    Neurological examination  Mentation: Alert oriented to time, place, history taking. Follows all commands speech and language fluent Cranial nerve II-XII: Pupils were equal round reactive to light. Extraocular movements were full, visual field were full on confrontational test. Facial sensation and strength were normal. Uvula tongue midline. Head turning and shoulder shrug  were normal and symmetric. Motor: The motor testing reveals 5 over 5 strength of all 4 extremities. Good symmetric motor tone is noted throughout.  Sensory: Sensory testing is intact to soft touch on all 4 extremities. No evidence of extinction is noted.  Coordination: Cerebellar testing reveals good finger-nose-finger and heel-to-shin bilaterally.  Gait and station: Gait is normal.  Reflexes: Deep tendon reflexes are symmetric and normal bilaterally.    DIAGNOSTIC DATA (LABS,  IMAGING, TESTING) - I reviewed patient records, labs, notes, testing and imaging myself where available.  Lab Results  Component Value Date   WBC 9.2 12/28/2020   HGB 13.8 12/28/2020   HCT 40.0 12/28/2020   MCV 91 12/28/2020   PLT 297 12/28/2020      Component Value Date/Time   NA 140 08/24/2020 1643   K 3.8 08/24/2020 1643   CL 113 (H) 08/24/2020 1643   CO2 20 08/24/2020 1643   GLUCOSE 115 (H) 08/24/2020 1643   BUN 16  08/24/2020 1643   CREATININE 0.90 08/24/2020 1643   CALCIUM 8.9 08/24/2020 1643   PROT 6.2 08/24/2020 1643   PROT 6.1 12/04/2016 1131   ALBUMIN 3.7 12/04/2016 1131   AST 10 08/24/2020 1643   ALT 10 08/24/2020 1643   ALKPHOS 73 12/04/2016 1131   BILITOT 0.3 08/24/2020 1643   BILITOT <0.2 12/04/2016 1131   GFRNONAA 80 08/24/2020 1643   GFRAA 93 08/24/2020 1643   No results found for: CHOL, HDL, LDLCALC, LDLDIRECT, TRIG, CHOLHDL No results found for: PJAS5K No results found for: VITAMINB12 Lab Results  Component Value Date   TSH 1.010 01/01/2018    No flowsheet data found.   No flowsheet data found.   ASSESSMENT AND PLAN  40 y.o. year old female  has a past medical history of Anxiety, Headache, Insomnia, Multiple sclerosis (HCC), Neuromuscular disorder (HCC), Nystagmus (02/15/2017), and Syncope and collapse. here with    Relapsing remitting multiple sclerosis (HCC) - Plan: CBC with Differential/Platelets, Stratify JCV Ab (w/ Index) w/ Rflx, CMP  High risk medication use - Plan: CBC with Differential/Platelets, Stratify JCV Ab (w/ Index) w/ Rflx, CMP  Dysesthesia  Other fatigue  Chronic low back pain without sciatica, unspecified back pain laterality  Vertigo of central origin  Insomnia, unspecified type  Estle is doing fairly well, today. We will continue Tysabri infusions every 4 weeks. I will update labs, today. I will have her continue baclofen, gabapentin and sumatriptan as prescribed. I have encouraged her to take Amovig injections every 30 days. May take 4-6 months to be fully effective. She may take meclizine as needed for vertigo. 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 Consider exercise regimen. She will follow up with [name] in 6 months.    Orders Placed This Encounter  Procedures   CBC with Differential/Platelets   Stratify JCV Ab (w/ Index) w/ Rflx   CMP      Meds ordered this encounter  Medications   meclizine (ANTIVERT) 25  MG tablet    Sig: Take 1 tablet (25 mg total) by mouth 3 (three) times daily as needed for dizziness.    Dispense:  30 tablet    Refill:  0    Order Specific Question:   Supervising Provider    Answer:   Anson Fret [[id number]]       ALP FXTKW, MSN, FNP-C 07/07/2021, 4:06 PM  [facility] [address] [place], [place] [address] [phone]   I have read the note, and I agree with the clinical assessment and plan.  Richard A. Epimenio Foot, MD, PhD, [facility] Certified in Neurology, Clinical Neurophysiology, Sleep Medicine, Pain Medicine and Neuroimaging  [facility] [address], [address] [place], [place] [address] [phone]

## 2021-07-07 NOTE — Telephone Encounter (Signed)
Placed JCV lab in [facility] lock box for routine lab pick up. Results pending. 

## 2021-07-08 LAB — CBC WITH DIFFERENTIAL/PLATELET
Basophils Absolute: 0.1 10*3/uL (ref 0.0–0.2)
Basos: 1 %
EOS (ABSOLUTE): 0.9 10*3/uL — ABNORMAL HIGH (ref 0.0–0.4)
Eos: 8 %
Hematocrit: 39.6 % (ref 34.0–46.6)
Hemoglobin: 13.6 g/dL (ref 11.1–15.9)
Immature Grans (Abs): 0 10*3/uL (ref 0.0–0.1)
Immature Granulocytes: 0 %
Lymphocytes Absolute: 3.8 10*3/uL — ABNORMAL HIGH (ref 0.7–3.1)
Lymphs: 35 %
MCH: 32.5 pg (ref 26.6–33.0)
MCHC: 34.3 g/dL (ref 31.5–35.7)
MCV: 95 fL (ref 79–97)
Monocytes Absolute: 0.9 10*3/uL (ref 0.1–0.9)
Monocytes: 8 %
NRBC: 1 % — ABNORMAL HIGH (ref 0–0)
Neutrophils Absolute: 5.1 10*3/uL (ref 1.4–7.0)
Neutrophils: 48 %
Platelets: 317 10*3/uL (ref 150–450)
RBC: 4.19 x10E6/uL (ref 3.77–5.28)
RDW: 12.6 % (ref 11.7–15.4)
WBC: 10.8 10*3/uL (ref 3.4–10.8)

## 2021-07-08 LAB — COMPREHENSIVE METABOLIC PANEL
ALT: 13 IU/L (ref 0–32)
AST: 9 IU/L (ref 0–40)
Albumin/Globulin Ratio: 1.7 (ref 1.2–2.2)
Albumin: 4.1 g/dL (ref 3.8–4.8)
Alkaline Phosphatase: 88 IU/L (ref 44–121)
BUN/Creatinine Ratio: 14 (ref 9–23)
BUN: 15 mg/dL (ref 6–24)
Bilirubin Total: <0.2 mg/dL (ref 0.0–1.2)
CO2: 22 mmol/L (ref 20–29)
Calcium: 9.5 mg/dL (ref 8.7–10.2)
Chloride: 103 mmol/L (ref 96–106)
Creatinine, Ser: 1.07 mg/dL — ABNORMAL HIGH (ref 0.57–1.00)
Globulin, Total: 2.4 g/dL (ref 1.5–4.5)
Glucose: 95 mg/dL (ref 65–99)
Potassium: 4.8 mmol/L (ref 3.5–5.2)
Sodium: 139 mmol/L (ref 134–144)
Total Protein: 6.5 g/dL (ref 6.0–8.5)
eGFR: 67 mL/min/{1.73_m2} (ref >59)

## 2021-07-10 ENCOUNTER — Other Ambulatory Visit: Payer: Self-pay | Admitting: Neurology

## 2021-07-11 ENCOUNTER — Other Ambulatory Visit: Payer: Self-pay | Admitting: Family Medicine

## 2021-07-11 DIAGNOSIS — F411 Generalized anxiety disorder: Secondary | ICD-10-CM

## 2021-07-12 NOTE — Telephone Encounter (Signed)
Ok to refill??  Last office visit 04/19/2021.  Last refill 04/14/2021, #1 refill.

## 2021-07-13 DIAGNOSIS — G35 Multiple sclerosis: Secondary | ICD-10-CM | POA: Diagnosis not present

## 2021-07-14 LAB — STRATIFY JCV AB (W/ INDEX) W/ RFLX
Index Value: 0.17
Stratify JCV (TM) Ab w/Reflex Inhibition: NEGATIVE

## 2021-07-15 ENCOUNTER — Other Ambulatory Visit: Payer: Self-pay | Admitting: Neurology

## 2021-07-18 DIAGNOSIS — M5412 Radiculopathy, cervical region: Secondary | ICD-10-CM | POA: Diagnosis not present

## 2021-07-19 ENCOUNTER — Encounter: Payer: Self-pay | Admitting: Family Medicine

## 2021-07-19 ENCOUNTER — Ambulatory Visit (INDEPENDENT_AMBULATORY_CARE_PROVIDER_SITE_OTHER): Payer: BC Managed Care – PPO | Admitting: Family Medicine

## 2021-07-19 ENCOUNTER — Other Ambulatory Visit: Payer: Self-pay

## 2021-07-19 VITALS — BP 118/74 | HR 88 | Temp 98.7°F | Resp 14 | Ht 64.0 in | Wt 227.0 lb

## 2021-07-19 DIAGNOSIS — R7989 Other specified abnormal findings of blood chemistry: Secondary | ICD-10-CM | POA: Diagnosis not present

## 2021-07-19 MED ORDER — PANTOPRAZOLE SODIUM 40 MG PO TBEC: 40.0000 mg | DELAYED_RELEASE_TABLET | Freq: Two times a day (BID) | ORAL | 5 refills | Status: DC

## 2021-07-19 NOTE — Progress Notes (Signed)
Subjective:    Patient ID: Virginia [name], female    DOB: 1981/03/28, 40 y.o.   MRN: [id number]  HPI  Patient comes in today for a follow-up of some lab work she recently had at the neurologist office.  They obtained a CMP which showed a creatinine level of 1.07.  Previously it had been around 0.9 in October of last year.  The patient states that she has been stable and not having any issues that she is aware of.  She has not been taking NSAIDs.  She states that she may not have been drinking enough water recently and might of been slightly dehydrated.  She denies any dysuria or urgency or frequency.  She denies any hematuria.   Past Medical History:  Diagnosis Date   Anxiety    Headache    Insomnia    Multiple sclerosis (HCC)    Neuromuscular disorder (HCC)    Phreesia 06/30/2020   Nystagmus 02/15/2017   Syncope and collapse    History reviewed. No pertinent surgical history. Current Outpatient Medications on File Prior to Visit  Medication Sig Dispense Refill   AIMOVIG 140 MG/ML SOAJ INJECT 140 MG INTO THE SKIN EVERY 30 (THIRTY) DAYS. 1 mL 5   amitriptyline (ELAVIL) 50 MG tablet TAKE 1 TABLET BY MOUTH AT BEDTIME AS NEEDED FOR SLEEP 30 tablet 8   atenolol (TENORMIN) 50 MG tablet TAKE 1 TABLET BY MOUTH EVERY DAY 30 tablet 11   baclofen (LIORESAL) 10 MG tablet Take one pill 3 to 5 times a day as needed for spasticity 450 tablet 11   clonazePAM (KLONOPIN) 1 MG tablet TAKE 1 TABLET BY MOUTH TWICE A DAY AS NEEDED FOR ANXIETY 20 tablet 1   diltiazem 2 % GEL Apply 1 application topically 3 (three) times daily. 30 g 1   DULoxetine (CYMBALTA) 60 MG capsule Take 1 capsule (60 mg total) by mouth daily. 30 capsule 3   gabapentin (NEURONTIN) 600 MG tablet TAKE 1 TABLET (600 MG TOTAL) BY MOUTH 4 (FOUR) TIMES DAILY. 120 tablet 11   meclizine (ANTIVERT) 25 MG tablet Take 1 tablet (25 mg total) by mouth 3 (three) times daily as needed for dizziness. 30 tablet 0   natalizumab (TYSABRI) 300 MG/15ML  injection Inject 300 mg into the vein every 28 (twenty-eight) days.     Norethindrone Acetate-Ethinyl Estrad-FE (JUNEL FE 24) 1-20 MG-MCG(24) tablet Take 1 tablet by mouth daily. 84 tablet 3   sucralfate (CARAFATE) 1 g tablet TAKE 1 TABLET BY MOUTH 4 TIMES DAILY - WITH MEALS AND AT BEDTIME. 120 tablet 1   SUMAtriptan (IMITREX) 50 MG tablet TAKE 1 TABLET EVERY 2 HRS AS NEEDED FOR MIGRAINE. MAY REPEAT IN 2 HRS IF HEADACHE PERSISTS OR RECURS 10 tablet 5   temazepam (RESTORIL) 30 MG capsule TAKE 1 CAPSULE (30 MG TOTAL) BY MOUTH AT BEDTIME AS NEEDED FOR SLEEP (DO NOT TAKE WITH KLONOPIN). 30 capsule 3   No current facility-administered medications on file prior to visit.   Allergies  Allergen Reactions   Codeine Other (See Comments)    Hallucinations    Onion    Topamax [Topiramate]     Was talking out of her head, walked into walls   Social History   Socioeconomic History   Marital status: Married    Spouse name: Not on file   Number of children: Not on file   Years of education: Not on file   Highest education level: Not on file  Occupational History  Not on file  Tobacco Use   Smoking status: Former    Types: E-cigarettes   Smokeless tobacco: Never  Substance and Sexual Activity   Alcohol use: Yes    Alcohol/week: 0.0 standard drinks    Comment: rarely   Drug use: No   Sexual activity: Yes  Other Topics Concern   Not on file  Social History Narrative   Not on file   Social Determinants of Health   Financial Resource Strain: Not on file  Food Insecurity: Not on file  Transportation Needs: Not on file  Physical Activity: Not on file  Stress: Not on file  Social Connections: Not on file  Intimate Partner Violence: Not on file      Review of Systems     Objective:   Physical Exam Constitutional:      Appearance: Normal appearance. She is obese.  Cardiovascular:     Rate and Rhythm: Normal rate and regular rhythm.     Heart sounds: Normal heart sounds.   Pulmonary:     Effort: Pulmonary effort is normal. No respiratory distress.     Breath sounds: Normal breath sounds. No stridor. No wheezing.  Abdominal:     General: Abdomen is flat. Bowel sounds are normal.     Palpations: Abdomen is soft.  Neurological:     General: No focal deficit present.     Mental Status: She is alert and oriented to person, place, and time. Mental status is at baseline.     Cranial Nerves: No cranial nerve deficit.        Assessment & Plan:  Elevated serum creatinine I recommended to drink plenty of water and hydrate normally over the next 2 days and then return to allow [place] to repeat the BMP.  At that time, I would also check a urinalysis and a spot urine protein to creatinine ratio.  If creatinine is elevated and/or protein creatinine ratio is greater than 30, I would recommend obtaining a 24-hour urine protein collection to evaluate for nephrotic range proteinuria.  I would also obtain a renal ultrasound to evaluate for any blockage/hydronephrosis or renal masses.

## 2021-07-22 ENCOUNTER — Encounter: Payer: Self-pay | Admitting: Family Medicine

## 2021-07-25 ENCOUNTER — Other Ambulatory Visit: Payer: Self-pay | Admitting: Family Medicine

## 2021-07-25 ENCOUNTER — Telehealth: Payer: Self-pay | Admitting: *Deleted

## 2021-07-25 MED ORDER — SAXENDA 18 MG/3ML ~~LOC~~ SOPN
0.6000 mg | PEN_INJECTOR | Freq: Every day | SUBCUTANEOUS | 5 refills | Status: DC
Start: 2021-07-25 — End: 2022-06-27

## 2021-07-25 MED ORDER — SAXENDA 18 MG/3ML ~~LOC~~ SOPN: 1.2000 mg | PEN_INJECTOR | Freq: Every day | SUBCUTANEOUS | 5 refills | Status: DC

## 2021-07-25 MED ORDER — INSULIN PEN NEEDLE 32G X 4 MM MISC: 1 refills | Status: DC

## 2021-07-25 NOTE — Telephone Encounter (Signed)
Received request from pharmacy for PA on   PA submitted.   Dx: E66.01- obesity.   Received immediate determination.   Case ID [id number] approved 06/25/2021- 11/22/2021.

## 2021-07-26 ENCOUNTER — Other Ambulatory Visit: Payer: Self-pay

## 2021-07-26 ENCOUNTER — Other Ambulatory Visit: Payer: BC Managed Care – PPO

## 2021-07-26 DIAGNOSIS — R7989 Other specified abnormal findings of blood chemistry: Secondary | ICD-10-CM | POA: Diagnosis not present

## 2021-07-27 LAB — MICROSCOPIC MESSAGE

## 2021-07-27 LAB — BASIC METABOLIC PANEL WITH GFR
BUN/Creatinine Ratio: 14 (calc) (ref 6–22)
BUN: 14 mg/dL (ref 7–25)
CO2: 27 mmol/L (ref 20–32)
Calcium: 9.3 mg/dL (ref 8.6–10.2)
Chloride: 103 mmol/L (ref 98–110)
Creat: 1.03 mg/dL — ABNORMAL HIGH (ref 0.50–0.99)
Glucose, Bld: 84 mg/dL (ref 65–99)
Potassium: 4.4 mmol/L (ref 3.5–5.3)
Sodium: 140 mmol/L (ref 135–146)
eGFR: 70 mL/min/{1.73_m2} (ref >=60)

## 2021-07-27 LAB — PROTEIN / CREATININE RATIO, URINE
Creatinine, Urine: 43 mg/dL (ref 20–275)
Protein/Creat Ratio: 93 mg/g creat (ref 24–184)
Protein/Creatinine Ratio: 0.093 mg/mg creat (ref 0.024–0.184)
Total Protein, Urine: 4 mg/dL — ABNORMAL LOW (ref 5–24)

## 2021-07-27 LAB — URINALYSIS, ROUTINE W REFLEX MICROSCOPIC
Bacteria, UA: NONE SEEN /HPF
Bilirubin Urine: NEGATIVE
Glucose, UA: NEGATIVE
Hgb urine dipstick: NEGATIVE
Hyaline Cast: NONE SEEN /LPF
Ketones, ur: NEGATIVE
Nitrite: NEGATIVE
Protein, ur: NEGATIVE
RBC / HPF: NONE SEEN /HPF (ref 0–2)
Specific Gravity, Urine: 1.007 (ref 1.001–1.035)
Squamous Epithelial / HPF: NONE SEEN /HPF (ref <=5)
WBC, UA: NONE SEEN /HPF (ref 0–5)
pH: 6.5 (ref 5.0–8.0)

## 2021-08-02 ENCOUNTER — Other Ambulatory Visit: Payer: Self-pay

## 2021-08-02 ENCOUNTER — Ambulatory Visit (INDEPENDENT_AMBULATORY_CARE_PROVIDER_SITE_OTHER): Payer: BC Managed Care – PPO | Admitting: Family Medicine

## 2021-08-02 VITALS — BP 128/78 | HR 100 | Temp 97.9°F | Resp 14 | Ht 64.0 in | Wt 229.0 lb

## 2021-08-02 DIAGNOSIS — J069 Acute upper respiratory infection, unspecified: Secondary | ICD-10-CM | POA: Diagnosis not present

## 2021-08-02 DIAGNOSIS — R062 Wheezing: Secondary | ICD-10-CM

## 2021-08-02 MED ORDER — HYDROCODONE BIT-HOMATROP MBR 5-1.5 MG/5ML PO SOLN: 5.0000 mL | Freq: Three times a day (TID) | ORAL | 0 refills | Status: DC | PRN

## 2021-08-02 MED ORDER — PREDNISONE 20 MG PO TABS: ORAL_TABLET | ORAL | 0 refills | Status: DC

## 2021-08-02 NOTE — Progress Notes (Signed)
Subjective:    Patient ID: Virginia [name], female    DOB: 14-Feb-1981, 40 y.o.   MRN: [id number]  HPI  Patient reports a 1 week history of a dry nonproductive cough and some mild shortness of breath.  She denies any fevers or chills.  Pulse oximetry is normal here today however she does have expiratory wheezing in all 4 lung fields and diminished airflow throughout.  She is been trying albuterol at home without any relief.  She does vape.  She denies any pleurisy or hemoptysis.  She does have some mild rhinorrhea and mild sore throat. Past Medical History:  Diagnosis Date   Anxiety    Headache    Insomnia    Multiple sclerosis (HCC)    Neuromuscular disorder (HCC)    Phreesia 06/30/2020   Nystagmus 02/15/2017   Syncope and collapse    No past surgical history on file. Current Outpatient Medications on File Prior to Visit  Medication Sig Dispense Refill   AIMOVIG 140 MG/ML SOAJ INJECT 140 MG INTO THE SKIN EVERY 30 (THIRTY) DAYS. 1 mL 5   amitriptyline (ELAVIL) 50 MG tablet TAKE 1 TABLET BY MOUTH AT BEDTIME AS NEEDED FOR SLEEP 30 tablet 8   atenolol (TENORMIN) 50 MG tablet TAKE 1 TABLET BY MOUTH EVERY DAY 30 tablet 11   baclofen (LIORESAL) 10 MG tablet Take one pill 3 to 5 times a day as needed for spasticity 450 tablet 11   clonazePAM (KLONOPIN) 1 MG tablet TAKE 1 TABLET BY MOUTH TWICE A DAY AS NEEDED FOR ANXIETY 20 tablet 1   diltiazem 2 % GEL Apply 1 application topically 3 (three) times daily. 30 g 1   DULoxetine (CYMBALTA) 60 MG capsule Take 1 capsule (60 mg total) by mouth daily. 30 capsule 3   gabapentin (NEURONTIN) 600 MG tablet TAKE 1 TABLET (600 MG TOTAL) BY MOUTH 4 (FOUR) TIMES DAILY. 120 tablet 11   Insulin Pen Needle 32G X 4 MM MISC USE AS DIRECTED TO INJECT SAXENDA SQ QD. 100 each 1   Liraglutide -Weight Management (SAXENDA) 18 MG/3ML SOPN Inject 0.6 mg into the skin daily. Increase dose by 06mg  SQ QW until max dose of 3mg  SQ QD is reached. 6 mL 5   meclizine (ANTIVERT) 25 MG  tablet Take 1 tablet (25 mg total) by mouth 3 (three) times daily as needed for dizziness. 30 tablet 0   natalizumab (TYSABRI) 300 MG/15ML injection Inject 300 mg into the vein every 28 (twenty-eight) days.     Norethindrone Acetate-Ethinyl Estrad-FE (JUNEL FE 24) 1-20 MG-MCG(24) tablet Take 1 tablet by mouth daily. 84 tablet 3   pantoprazole (PROTONIX) 40 MG tablet Take 1 tablet (40 mg total) by mouth 2 (two) times daily. 60 tablet 5   sucralfate (CARAFATE) 1 g tablet TAKE 1 TABLET BY MOUTH 4 TIMES DAILY - WITH MEALS AND AT BEDTIME. 120 tablet 1   SUMAtriptan (IMITREX) 50 MG tablet TAKE 1 TABLET EVERY 2 HRS AS NEEDED FOR MIGRAINE. MAY REPEAT IN 2 HRS IF HEADACHE PERSISTS OR RECURS 10 tablet 5   temazepam (RESTORIL) 30 MG capsule TAKE 1 CAPSULE (30 MG TOTAL) BY MOUTH AT BEDTIME AS NEEDED FOR SLEEP (DO NOT TAKE WITH KLONOPIN). 30 capsule 3   No current facility-administered medications on file prior to visit.   Allergies  Allergen Reactions   Codeine Other (See Comments)    Hallucinations    Onion    Topamax [Topiramate]     Was talking out of her head,  walked into walls   Social History   Socioeconomic History   Marital status: Married    Spouse name: Not on file   Number of children: Not on file   Years of education: Not on file   Highest education level: Not on file  Occupational History   Not on file  Tobacco Use   Smoking status: Former    Types: E-cigarettes   Smokeless tobacco: Never  Substance and Sexual Activity   Alcohol use: Yes    Alcohol/week: 0.0 standard drinks    Comment: rarely   Drug use: No   Sexual activity: Yes  Other Topics Concern   Not on file  Social History Narrative   Not on file   Social Determinants of Health   Financial Resource Strain: Not on file  Food Insecurity: Not on file  Transportation Needs: Not on file  Physical Activity: Not on file  Stress: Not on file  Social Connections: Not on file  Intimate Partner Violence: Not on file       Review of Systems     Objective:   Physical Exam Constitutional:      Appearance: Normal appearance. She is obese.  HENT:     Right Ear: Tympanic membrane and ear canal normal.     Left Ear: Tympanic membrane and ear canal normal.     Nose: Rhinorrhea present.     Mouth/Throat:     Mouth: Mucous membranes are moist.     Pharynx: No oropharyngeal exudate or posterior oropharyngeal erythema.  Eyes:     Conjunctiva/sclera: Conjunctivae normal.  Cardiovascular:     Rate and Rhythm: Normal rate and regular rhythm.     Heart sounds: Normal heart sounds.  Pulmonary:     Effort: Pulmonary effort is normal. No respiratory distress.     Breath sounds: No stridor. Wheezing present. No rhonchi or rales.  Neurological:     General: No focal deficit present.     Mental Status: She is alert and oriented to person, place, and time. Mental status is at baseline.     Cranial Nerves: No cranial nerve deficit.        Assessment & Plan:  Viral upper respiratory tract infection  Wheezing I believe the patient has a viral upper respiratory infection.  This should improve with tincture of time.  However I will start her on prednisone taper pack for bronchospasm since she is not tolerating the albuterol.  I will give her Hycodan 1 teaspoon every 6 hours as needed for cough.  Reassess in 48 hours if no better or sooner if worsening

## 2021-08-03 ENCOUNTER — Other Ambulatory Visit: Payer: Self-pay | Admitting: Family Medicine

## 2021-08-03 NOTE — Telephone Encounter (Signed)
Ok to refill??  Last office visit 08/02/2021.  Last refill 03/14/2021, #3 refills.

## 2021-08-08 ENCOUNTER — Encounter: Payer: Self-pay | Admitting: Family Medicine

## 2021-08-09 ENCOUNTER — Other Ambulatory Visit: Payer: Self-pay

## 2021-08-09 ENCOUNTER — Other Ambulatory Visit: Payer: Self-pay | Admitting: Family Medicine

## 2021-08-09 ENCOUNTER — Ambulatory Visit
Admission: RE | Admit: 2021-08-09 | Discharge: 2021-08-09 | Disposition: A | Discharge status: Home / Self Care | Payer: BC Managed Care – PPO | Source: Ambulatory Visit | Attending: Family Medicine | Admitting: Family Medicine

## 2021-08-09 DIAGNOSIS — R0789 Other chest pain: Secondary | ICD-10-CM

## 2021-08-09 DIAGNOSIS — R0602 Shortness of breath: Secondary | ICD-10-CM | POA: Diagnosis not present

## 2021-08-09 DIAGNOSIS — R079 Chest pain, unspecified: Secondary | ICD-10-CM | POA: Diagnosis not present

## 2021-08-09 DIAGNOSIS — R059 Cough, unspecified: Secondary | ICD-10-CM | POA: Diagnosis not present

## 2021-08-11 ENCOUNTER — Other Ambulatory Visit: Payer: Self-pay | Admitting: Family Medicine

## 2021-08-11 MED ORDER — HYDROCODONE BIT-HOMATROP MBR 5-1.5 MG/5ML PO SOLN: 5.0000 mL | Freq: Three times a day (TID) | ORAL | 0 refills | Status: DC | PRN

## 2021-08-13 ENCOUNTER — Other Ambulatory Visit: Payer: Self-pay | Admitting: Neurology

## 2021-08-22 ENCOUNTER — Encounter: Payer: Self-pay | Admitting: Family Medicine

## 2021-08-31 ENCOUNTER — Other Ambulatory Visit: Payer: Self-pay

## 2021-08-31 ENCOUNTER — Ambulatory Visit (INDEPENDENT_AMBULATORY_CARE_PROVIDER_SITE_OTHER): Payer: BC Managed Care – PPO | Admitting: Obstetrics & Gynecology

## 2021-08-31 ENCOUNTER — Other Ambulatory Visit (HOSPITAL_COMMUNITY)
Admission: RE | Admit: 2021-08-31 | Discharge: 2021-08-31 | Disposition: A | Discharge status: Home / Self Care | Payer: BC Managed Care – PPO | Source: Ambulatory Visit | Attending: Obstetrics & Gynecology | Admitting: Obstetrics & Gynecology

## 2021-08-31 ENCOUNTER — Encounter: Payer: Self-pay | Admitting: Obstetrics & Gynecology

## 2021-08-31 VITALS — BP 120/80 | Ht 64.0 in | Wt 237.0 lb

## 2021-08-31 DIAGNOSIS — N871 Moderate cervical dysplasia: Secondary | ICD-10-CM

## 2021-08-31 MED ORDER — MEDROXYPROGESTERONE ACETATE 150 MG/ML IM SUSP: 150.0000 mg | INTRAMUSCULAR | 3 refills | Status: DC

## 2021-08-31 NOTE — Patient Instructions (Signed)
Medroxyprogesterone Injection (Contraception) What is this medication? MEDROXYPROGESTERONE (me DROX ee proe JES te rone) prevents ovulation and pregnancy. It belongs to a group of medications called contraceptives. Thismedication is a progestin hormone. This medicine may be used for other purposes; ask your health care provider orpharmacist if you have questions. COMMON BRAND NAME(S): Depo-Provera, Depo-subQ Provera 104 What should I tell my care team before I take this medication? They need to know if you have any of these conditions: Asthma Blood clots Breast cancer or family history of breast cancer Depression Diabetes Eating disorder (anorexia nervosa) Heart attack High blood pressure HIV infection or AIDS If you often drink alcohol Kidney disease Liver disease Migraine headaches Osteoporosis, weak bones Seizures Stroke Tobacco smoker Vaginal bleeding An unusual or allergic reaction to medroxyprogesterone, other hormones, medications, foods, dyes, or preservatives Pregnant or trying to get pregnant Breast-feeding How should I use this medication? Depo-Provera CI contraceptive injection is given into a muscle. Depo-subQ Provera 104 injection is given under the skin. It is given in a hospital or clinic setting. The injection is usually given during the first 5 days afterthe start of a menstrual period or 6 weeks after delivery of a baby. A patient package insert for the product will be given with each prescription and refill. Be sure to read this information carefully each time. The sheet maychange often. Talk to your care team about the use of this medication in children. Special care may be needed. These injections have been used in female children who havestarted having menstrual periods. Overdosage: If you think you have taken too much of this medicine contact apoison control center or emergency room at once. NOTE: This medicine is only for you. Do not share this medicine with  others. What if I miss a dose? Keep appointments for follow-up doses. You must get an injection once every 3 months. It is important not to miss your dose. Call your care team if you areunable to keep an appointment. What may interact with this medication? Antibiotics or medications for infections, especially rifampin and griseofulvin Antivirals for HIV or hepatitis Aprepitant Armodafinil Bexarotene Bosentan Medications for seizures like carbamazepine, felbamate, oxcarbazepine, phenytoin, phenobarbital, primidone, topiramate Mitotane Modafinil St. John's wort This list may not describe all possible interactions. Give your health care provider a list of all the medicines, herbs, non-prescription drugs, or dietary supplements you use. Also tell them if you smoke, drink alcohol, or use illegaldrugs. Some items may interact with your medicine. What should I watch for while using this medication? This medication does not protect you against HIV infection (AIDS) or othersexually transmitted diseases. Use of this product may cause you to lose calcium from your bones. Loss of calcium may cause weak bones (osteoporosis). Only use this product for more than 2 years if other forms of birth control are not right for you. The longer you use this product for birth control the more likely you will be at risk forweak bones. Ask your care team how you can keep strong bones. You may have a change in bleeding pattern or irregular periods. Many femalesstop having periods while taking this medication. If you have received your injections on time, your chance of being pregnant is very low. If you think you may be pregnant, see your care team as soon aspossible. Tell your care team if you want to get pregnant within the next year. The effect of this medication may last a long time after you get your lastinjection. What side effects may I   notice from receiving this medication? Side effects that you should report to  your care team as soon as possible: Allergic reactions-skin rash, itching, hives, swelling of the face, lips, tongue, or throat Blood clot-pain, swelling, or warmth in the leg, shortness of breath, chest pain Gallbladder problems-severe stomach pain, nausea, vomiting, fever Increase in blood pressure Liver injury-right upper belly pain, loss of appetite, nausea, light-colored stool, dark yellow or brown urine, yellowing skin or eyes, unusual weakness or fatigue New or worsening migraines or headaches Seizures Stroke-sudden numbness or weakness of the face, arm, or leg, trouble speaking, confusion, trouble walking, loss of balance or coordination, dizziness, severe headache, change in vision Unusual vaginal discharge, itching, or odor Worsening mood, feelings of depression Side effects that usually do not require medical attention (report to your careteam if they continue or are bothersome): Breast pain or tenderness Dark patches of the skin on the face or other sun-exposed areas Irregular menstrual cycles or spotting Nausea Weight gain This list may not describe all possible side effects. Call your doctor for medical advice about side effects. You may report side effects to FDA at1-800-FDA-1088. Where should I keep my medication? This injection is only given by a care team. It will not be stored at home. NOTE: This sheet is a summary. It may not cover all possible information. If you have questions about this medicine, talk to your doctor, pharmacist, orhealth care provider.  2022 Elsevier/Gold Standard (2020-11-22 12:23:33)  

## 2021-08-31 NOTE — Progress Notes (Signed)
HPI:  Patient is a 40 y.o. G1P1001 presenting for follow up evaluation of abnormal PAP smear in the past.  Her last PAP was 7 months ago and was abnormal: LGSIL . She has had a prior colposcopy. Prior biopsies (if done) were  CIN II.  She then had Cryo treatment to cervix 6 mos ago .  Pt also reports irreg bleeding despite the last 6 mos taking OCPs.  She also relies on them for birth control.  PMHx: She  has a past medical history of Anxiety, Headache, Insomnia, Multiple sclerosis (HCC), Neuromuscular disorder (HCC), Nystagmus (02/15/2017), and Syncope and collapse. Also,  has no past surgical history on file., family history includes ALS in her maternal grandmother; Anxiety disorder in her mother; Emphysema in her mother; Osteoarthritis in her mother; Other in her father; Suicidality in her father; Uterine cancer (age of onset: 34) in her mother.,  reports that she has quit smoking. Her smoking use included e-cigarettes. She has never used smokeless tobacco. She reports current alcohol use. She reports that she does not use drugs.  She has a current medication list which includes the following prescription(s): aimovig, amitriptyline, atenolol, baclofen, clonazepam, diltiazem, duloxetine, gabapentin, hydrocodone bit-homatropine, insulin pen needle, saxenda, meclizine, medroxyprogesterone, natalizumab, pantoprazole, prednisone, sucralfate, sumatriptan, and temazepam. Also, is allergic to codeine, onion, and topamax [topiramate].  Review of Systems  All other systems reviewed and are negative.  Objective: BP 120/80   Ht 5\' 4"  (1.626 m)   Wt 237 lb (107.5 kg)   LMP 08/16/2021   BMI 40.68 kg/m  Filed Weights   08/31/21 1423  Weight: 237 lb (107.5 kg)   Body mass index is 40.68 kg/m.  Physical examination Physical Exam Constitutional:      General: She is not in acute distress.    Appearance: She is well-developed.  Genitourinary:     Right Labia: No rash or tenderness.    Left Labia:  No tenderness or rash.    No vaginal erythema or bleeding.      Right Adnexa: not tender and no mass present.    Left Adnexa: not tender and no mass present.    No cervical motion tenderness, discharge, polyp or nabothian cyst.     Uterus is not enlarged.     No uterine mass detected.    Pelvic exam was performed with patient in the lithotomy position.  HENT:     Head: Normocephalic and atraumatic.     Nose: Nose normal.  Abdominal:     General: There is no distension.     Palpations: Abdomen is soft.     Tenderness: There is no abdominal tenderness.  Musculoskeletal:        General: Normal range of motion.  Neurological:     Mental Status: She is alert and oriented to person, place, and time.     Cranial Nerves: No cranial nerve deficit.  Skin:    General: Skin is warm and dry.  Psychiatric:        Attention and Perception: Attention normal.        Mood and Affect: Mood and affect normal.        Speech: Speech normal.        Behavior: Behavior normal.        Thought Content: Thought content normal.        Judgment: Judgment normal.    ASSESSMENT:  History of Cervical Dysplasia CIN II S/p Cryo 6 mos ago  Plan:  1.  I discussed  the grading system of pap smears and HPV high risk viral types.   2. Follow up PAP 6 months, vs intervention if high grade dysplasia identified. 3. Treatment of persistantly abnormal PAP smears and cervical dysplasia, even mild, is discussed w pt today in detail, as well as the pros and cons of LEEP procedures. Will consider and discuss after results. 4. Also to change from OCP to Depo for contraception and bleeding control of periods.  A total of 21 minutes were spent face-to-face with the patient as well as preparation, review, communication, and documentation during this encounter.    Annamarie Major, MD, [facility], [facility] 08/31/2021  2:46 PM

## 2021-09-01 ENCOUNTER — Telehealth: Payer: Self-pay

## 2021-09-01 ENCOUNTER — Encounter: Payer: Self-pay | Admitting: Neurology

## 2021-09-01 ENCOUNTER — Ambulatory Visit (INDEPENDENT_AMBULATORY_CARE_PROVIDER_SITE_OTHER): Payer: BC Managed Care – PPO | Admitting: Neurology

## 2021-09-01 VITALS — BP 116/76 | HR 96 | Ht 64.0 in | Wt 237.5 lb

## 2021-09-01 DIAGNOSIS — M545 Low back pain, unspecified: Secondary | ICD-10-CM | POA: Diagnosis not present

## 2021-09-01 DIAGNOSIS — G35 Multiple sclerosis: Secondary | ICD-10-CM

## 2021-09-01 DIAGNOSIS — G43709 Chronic migraine without aura, not intractable, without status migrainosus: Secondary | ICD-10-CM

## 2021-09-01 DIAGNOSIS — Z79899 Other long term (current) drug therapy: Secondary | ICD-10-CM | POA: Diagnosis not present

## 2021-09-01 DIAGNOSIS — R208 Other disturbances of skin sensation: Secondary | ICD-10-CM

## 2021-09-01 DIAGNOSIS — G8929 Other chronic pain: Secondary | ICD-10-CM

## 2021-09-01 DIAGNOSIS — G4700 Insomnia, unspecified: Secondary | ICD-10-CM

## 2021-09-01 MED ORDER — BACLOFEN 10 MG PO TABS: ORAL_TABLET | ORAL | 3 refills | Status: DC

## 2021-09-01 NOTE — Telephone Encounter (Signed)
Received Kesimpta Start Form from Dr. Epimenio Foot. Patient had a TB test a few years ago. He is rechecking other labs and as long as these are ok, we can send the start form in next week.  I completed PA for kesimpta via [facility]. Sent to [employer]. Key: [id number]. Should have a determination within 3-5 business days.

## 2021-09-01 NOTE — Progress Notes (Signed)
[facility]  PATIENT: Virginia [name] DOB: 09-04-1981  REFERRING DOCTOR OR PCP:    Blair Heys SOURCE: patient, admit/discharge hospital notes, imaging reports, MRI's on PACS  _________________________________   HISTORICAL  CHIEF COMPLAINT:  Chief Complaint  Patient presents with   Follow-up    Rm 2, alone. Here for discuss other DMT for MS. Currently on Tysabri    HISTORY OF PRESENT ILLNESS:  Virginia [name] is a 40 y.o. woman with relapsing remitting multiple sclerosis who also has back pain.  Update 09/01/2021: She is on Tysabri 300 mg q4 weeks.  She had her last Tysabri infusion 07/12/2021.   Last JCV Ab was 12/28/2020 and is pending.   MRI of the brain 01/20/2020 showed scattered T2/FLAIR hyperintense foci in the hemispheres and medulla in a pattern and configuration consistent with chronic demyelinating plaque associated with multiple sclerosis.   Compared to the MRI from 01/17/2019, there are no new lesions.    Although she has done very well with the Tysabri, she lives more than 30 minutes away and it is difficult for her to make the monthly infusion visits.  Therefore she would like to consider a different disease modifying therapy.  We went over a few options and she is most interested in the symptoms.  She is noting more fatigue.   She notes sleep is poor at night.    Mood is fine.     Gait is about the same with mildly reduced balance.   She stumbles but no recent falls.  She has burning in her legs, right > left helped some by gabapentin 600 mg po qid.   She is noting more muscle cramps in her legs, esp calves.     She has occasional vertigo spells, triggered by changes in position.   She denies hearing loss.    Bladder function is fine.    She has low back pain, worse when she stands.  Sometimes she gets pain in the feet as well.  She has a disc herniation at L5-S1.  She also has neck pain but not arm pain.    She continues to get some headaches but these  are fairly stable.  Aimovig with amitriptyline has helped the most.  MS History:   In mid May 2017, she was starting to experience occasional lightheadedness and a spinning vertigo. On 03/19/2016, she had more extreme vertigo and had an episode of syncope. She notices that when she was walking she would veer towards the right. Because of the syncope, she was taken to the emergency room. He had an MRI of the brain that showed several enhancing lesions, potentially worrisome for multiple sclerosis. Additional studies were performed. She received 3 days of IV steroids. The MRI's of the spine did not show any additional plaques. The lumbar puncture showed oligoclonal bands with normal IgG index, consistent with multiple sclerosis.   She started  Aubagio.   MRI in 2018 showed additional foci plus she had tolerability issues.   She has been on Tysabri since 12/26/2016    LABS/LP She underwent a lumbar puncture on 03/21/2016. 4 oligoclonal bands present in the CSF which were not present in the serum, consistent with multiple sclerosis. The IgG index was high normal at 0.6.   There were only 4 white blood cells but 280 red blood cells more consistent with a slightly traumatic tap.    The [facility] Spotted Fever CSF IgG was negative but the IgM was positive at 1.41 (less than  0.9 normal).    ANA and ANCA were both negative.  IMAGING MRI images from 03/20/2016, 03/22/2016 and CT angiogram images from 03/23/2016 reviewed: The MRI of the brain shows several 2/fair hyperintense foci, some in the periventricular and juxtacortical white matter. Most of the foci enhanced after contrast administration. MRI of the cervical and thoracic spine did not show any spinal cord plaques and there was no significant degenerative change.   CT angiogram was essentially normal showing no significant stenosis.  MRI 11/28/2016 showed multiple periventricular, subcortical, pericallosal as well as craniovertebral junction white matter  hyperintensities compatible with multiple sclerosis. Multiple enhancing lesions are noted in the periventricular regions with the largest one in the left posterior frontal subcortical white matter. Incidental large venous angioma is noted in the right parietal region. Compared with MRI scan dated 03/20/2016 there are several new enhancing as well as nonenhancing white matter lesions.  MRI 12/25/2016 of the cervical spine showed two T2 hyperintense foci within the left posterolateral spinal cord, one adjacent to C6-C7 and one adjacent to T1. There may have been subtle signal within the spinal cord adjacent to C6-C7 on the previous scan but the current focus is clearly larger. The focus adjacent to T1 was not evident on the previous MRI. Neither of these 2 foci enhanced with contrast.         There is mild spinal stenosis at C4-C5, C5-C6 and C6-C7, unchanged when compared to the previous MRI. There is mild right foraminal narrowing at C6-C7 but there is no nerve root compression.  MRI 01/10/2018 of the brain showed multiple T2/FLAIR hyperintense foci in the brainstem and in the periventricular, juxtacortical and deep white matter in a pattern and configuration consistent with chronic demyelinating plaque associated with multiple sclerosis.  Compared to the MRI dated 11/28/2016, there is an improved appearance in the lesions that were enhancing at that time no longer do so.  There are no new lesions.  MRI [facility] 01/27/2018 showed, at L4-L5, there is stable mild left facet hypertrophy and mild ligamentum flavum hypertrophy.  There is no nerve root compression.     At L5-S1, there is a stable small central disc herniation causing bilateral lateral recess narrowing.  This contacts both of the S1 nerve roots.  There is no definite change when compared to the previous MRI.  MRI brain 01/17/2019 showed multiple T2/flair hyperintense foci in the hemispheres in a pattern and configuration consistent with chronic  demyelinating plaque associated with multiple sclerosis.  Two foci that have been seen previously in the brainstem are not apparent on the current MRI.  There are no new lesions compared to the 2019 MRI.  None of the foci enhance.    There is a small developmental venous anomaly in the right parietal lobe that is unchanged in appearance.  Otherwise, the enhancement pattern is normal.  MRI of the brain 01/20/2020 showed scattered T2/FLAIR hyperintense foci in the hemispheres and medulla in a pattern and configuration consistent with chronic demyelinating plaque associated with multiple sclerosis.   Compared to the MRI from 01/17/2019, there are no new lesions.     REVIEW OF SYSTEMS: Constitutional: No fevers, chills, sweats, or change in appetite.   She has fatigue. Insomnia much better with temazepam Eyes: No visual changes, double vision, eye pain Ear, nose and throat: No hearing loss, ear pain, nasal congestion, sore throat Cardiovascular: No chest pain, palpitations Respiratory:  No shortness of breath at rest or with exertion.   No wheezes GastrointestinaI: No  nausea, vomiting, diarrhea, abdominal pain, fecal incontinence Genitourinary:  No dysuria, urinary retention.   She reports frequency.  No nocturia. Musculoskeletal:  No neck pain, back pain Integumentary: No rash, pruritus, skin lesions Neurological: as above Psychiatric: No depression at this time.  No anxiety Endocrine: No palpitations, diaphoresis, change in appetite, change in weigh or increased thirst Hematologic/Lymphatic:  No anemia, purpura, petechiae. Allergic/Immunologic: No itchy/runny eyes, nasal congestion, recent allergic reactions, rashes  ALLERGIES: Allergies  Allergen Reactions   Codeine Other (See Comments)    Hallucinations    Onion    Topamax [Topiramate]     Was talking out of her head, walked into walls    HOME MEDICATIONS:  Current Outpatient Medications:    AIMOVIG 140 MG/ML SOAJ, INJECT 140 MG  INTO THE SKIN EVERY 30 (THIRTY) DAYS., Disp: 1 mL, Rfl: 5   amitriptyline (ELAVIL) 50 MG tablet, TAKE 1 TABLET BY MOUTH AT BEDTIME AS NEEDED FOR SLEEP, Disp: 30 tablet, Rfl: 8   atenolol (TENORMIN) 50 MG tablet, TAKE 1 TABLET BY MOUTH EVERY DAY, Disp: 30 tablet, Rfl: 11   clonazePAM (KLONOPIN) 1 MG tablet, TAKE 1 TABLET BY MOUTH TWICE A DAY AS NEEDED FOR ANXIETY, Disp: 20 tablet, Rfl: 1   diltiazem 2 % GEL, Apply 1 application topically 3 (three) times daily., Disp: 30 g, Rfl: 1   DULoxetine (CYMBALTA) 60 MG capsule, Take 1 capsule (60 mg total) by mouth daily., Disp: 30 capsule, Rfl: 3   gabapentin (NEURONTIN) 600 MG tablet, TAKE 1 TABLET (600 MG TOTAL) BY MOUTH 4 (FOUR) TIMES DAILY., Disp: 120 tablet, Rfl: 11   HYDROcodone bit-homatropine (HYCODAN) 5-1.5 MG/5ML syrup, Take 5 mLs by mouth every 8 (eight) hours as needed for cough., Disp: 120 mL, Rfl: 0   Insulin Pen Needle 32G X 4 MM MISC, USE AS DIRECTED TO INJECT SAXENDA SQ QD., Disp: 100 each, Rfl: 1   Liraglutide -Weight Management (SAXENDA) 18 MG/3ML SOPN, Inject 0.6 mg into the skin daily. Increase dose by 06mg  SQ QW until max dose of 3mg  SQ QD is reached., Disp: 6 mL, Rfl: 5   meclizine (ANTIVERT) 25 MG tablet, Take 1 tablet (25 mg total) by mouth 3 (three) times daily as needed for dizziness., Disp: 30 tablet, Rfl: 0   medroxyPROGESTERone (DEPO-PROVERA) 150 MG/ML injection, Inject 1 mL (150 mg total) into the muscle every 3 (three) months., Disp: 1 mL, Rfl: 3   natalizumab (TYSABRI) 300 MG/15ML injection, Inject 300 mg into the vein every 28 (twenty-eight) days., Disp: , Rfl:    pantoprazole (PROTONIX) 40 MG tablet, Take 1 tablet (40 mg total) by mouth 2 (two) times daily., Disp: 60 tablet, Rfl: 5   predniSONE (DELTASONE) 20 MG tablet, 3 tabs poqday 1-2, 2 tabs poqday 3-4, 1 tab poqday 5-6, Disp: 12 tablet, Rfl: 0   sucralfate (CARAFATE) 1 g tablet, TAKE 1 TABLET BY MOUTH 4 TIMES DAILY - WITH MEALS AND AT BEDTIME., Disp: 120 tablet, Rfl: 1    SUMAtriptan (IMITREX) 50 MG tablet, TAKE 1 TABLET EVERY 2 HRS AS NEEDED FOR MIGRAINE. MAY REPEAT IN 2 HRS IF HEADACHE PERSISTS OR RECURS, Disp: 10 tablet, Rfl: 5   temazepam (RESTORIL) 30 MG capsule, TAKE 1 CAPSULE (30 MG TOTAL) BY MOUTH AT BEDTIME AS NEEDED FOR SLEEP (DO NOT TAKE WITH KLONOPIN)., Disp: 30 capsule, Rfl: 3   baclofen (LIORESAL) 10 MG tablet, TAKE 2TABLETS BY MOUTH 3 TIMES A DAY FOR SPASTICITY, Disp: 540 tablet, Rfl: 3  PAST MEDICAL HISTORY: Past Medical  History:  Diagnosis Date   Anxiety    Headache    Insomnia    Multiple sclerosis (HCC)    Neuromuscular disorder (HCC)    Phreesia 06/30/2020   Nystagmus 02/15/2017   Syncope and collapse     PAST SURGICAL HISTORY: History reviewed. No pertinent surgical history.  FAMILY HISTORY: Family History  Problem Relation Age of Onset   Emphysema Mother    Osteoarthritis Mother    Anxiety disorder Mother    Uterine cancer Mother 46   Other Father    Suicidality Father    ALS Maternal Grandmother    Colon cancer Neg Hx    Colon polyps Neg Hx    Esophageal cancer Neg Hx    Rectal cancer Neg Hx    Stomach cancer Neg Hx     SOCIAL HISTORY:  Social History   Socioeconomic History   Marital status: Married    Spouse name: Not on file   Number of children: Not on file   Years of education: Not on file   Highest education level: Not on file  Occupational History   Not on file  Tobacco Use   Smoking status: Former    Types: E-cigarettes   Smokeless tobacco: Never  Substance and Sexual Activity   Alcohol use: Yes    Alcohol/week: 0.0 standard drinks    Comment: rarely   Drug use: No   Sexual activity: Yes  Other Topics Concern   Not on file  Social History Narrative   Not on file   Social Determinants of Health   Financial Resource Strain: Not on file  Food Insecurity: Not on file  Transportation Needs: Not on file  Physical Activity: Not on file  Stress: Not on file  Social Connections: Not on file   Intimate Partner Violence: Not on file     PHYSICAL EXAM  Vitals:   09/01/21 0906  BP: 116/76  Pulse: 96  Weight: 237 lb 8 oz (107.7 kg)  Height: 5\' 4"  (1.626 m)    Body mass index is 40.77 kg/m.   General: The patient is well-developed and well-nourished and in no acute distress,.  Mild pedal edema on the left.   No occipital tenderness Neurologic Exam  Mental status: The patient is alert and oriented x 3 at the time of the examination. The patient has apparent normal recent and remote memory, with an apparently normal attention span and concentration ability.   Speech is normal.  Cranial nerves: Extraocular muscles are full.  Now no end gaze nystagmus to the left..  Facial strength and sensation is normal. Trapezius strength is normal. No obvious hearing deficits are noted.  Motor:  Muscle bulk is normal.  Strength is 5/5. Tone is mildly increased in the legs.  Sensory: She had intact sensation to touch and vibration in arms but reduced sensation to touch/temp and vibration in right leg.    Coordination: Cerebellar testing reveals good finger-nose-finger and heel-to-shin bilaterally.  Gait and station: Station is normal.   The gait is minimally wide.  Tandem gait is mildly wide  Romberg is negative.  Reflexes: Deep tendon reflexes are symmetric and normal in arms.  DTRs are brisk at knees with some spread but no ankle clonus.    DIAGNOSTIC DATA (LABS, IMAGING, TESTING) - I reviewed patient records, labs, notes, testing and imaging myself where available.  Lab Results  Component Value Date   WBC 10.8 07/07/2021   HGB 13.6 07/07/2021   HCT 39.6 07/07/2021  MCV 95 07/07/2021   PLT 317 07/07/2021      Component Value Date/Time   NA 140 07/26/2021 1437   NA 139 07/07/2021 1544   K 4.4 07/26/2021 1437   CL 103 07/26/2021 1437   CO2 27 07/26/2021 1437   GLUCOSE 84 07/26/2021 1437   BUN 14 07/26/2021 1437   BUN 15 07/07/2021 1544   CREATININE 1.03 (H) 07/26/2021  1437   CALCIUM 9.3 07/26/2021 1437   PROT 6.5 07/07/2021 1544   ALBUMIN 4.1 07/07/2021 1544   AST 9 07/07/2021 1544   ALT 13 07/07/2021 1544   ALKPHOS 88 07/07/2021 1544   BILITOT <0.2 07/07/2021 1544   GFRNONAA 80 08/24/2020 1643   GFRAA 93 08/24/2020 1643    Lab Results  Component Value Date   TSH 1.010 01/01/2018       ASSESSMENT AND PLAN    1. High risk medication use   2. Relapsing remitting multiple sclerosis (HCC)   3. Chronic migraine w/o aura w/o status migrainosus, not intractable   4. Chronic low back pain without sciatica, unspecified back pain laterality   5. Dysesthesia   6. Insomnia, unspecified type      1.    She will change from Tysabri to [employer].  I had her get lab work today.   2.   Continue baclofen up to 60 mg in split dose a day   3.   Continue gabapentin 600 mg 3-4 times a day.  If dysesthesias worsen, consider lamotrigine.    4.   For headaches, continue Aimovig.   Take amitriptyline 50 mg nightly.    5.   Return 5-6 months.  Call sooner if there are new or worsening neurologic symptoms.  Shaasia Odle A. Epimenio Foot, MD, PhD 09/01/2021, 6:37 PM Certified in Neurology, Clinical Neurophysiology, Sleep Medicine, Pain Medicine and Neuroimaging  [facility] [address], [address] [place], [place] [address] [phone]

## 2021-09-05 ENCOUNTER — Encounter: Payer: Self-pay | Admitting: Neurology

## 2021-09-05 NOTE — Progress Notes (Signed)
Member [id number] Case number: [id number]  To Clinical Appeals Department  (Fax: [phone])  I am writing to appeal your decision not to cover Kesimpta (ofatumumab) for [employer].    She is a 40 year old woman who is followed at the MS clinic at [facility] for relapsing remitting multiple sclerosis.  She was diagnosed with MS in June 2017 after presenting with vertigo and gait disturbance and found to have multiple lesions on MRI, many that enhanced after contrast.  MS was confirmed with CSF analysis.  She was started on Aubagio (teriflunomide).  Unfortunately, she had extensive breakthrough activity on MRI 11/21/2016 and she was switched to Tysabri in February 2018.  Fortunately, she responded well to this higher efficacy medication and has remained relapse free and without new MRI activity.  However, due to working a full-time job, she is having difficulty traveling to the office for her infusions.  Therefore, she wishes to change therapy.  Because of her aggressive MS that broke through a lower efficacy disease modifying therapy, it is essential that she be placed on a highly efficacious disease modifying therapy such as Kesimpta.  We have received a notice that [name] is denied because you wish for her to be placed on a lowerefficacious medication, either glatiramer acetate or dimethyl fumarate.  Neither of these options is appropriate for a patient with aggressive multiple sclerosis and it is medically necessary that she be placed on a highly efficacious DMT such as Kesimpta.  She failed another low efficacy DMT (teriflunomide) in the past.  Failing to be placed on a highly efficacious DMT, such as Kesimpta, greatly increases the risk that she will have more relapses and a higher level of disability in the future.  Please reconsider your decision and approve coverage for Kesimpta for [name].  Feel let me know if I can provide any further information.Pearletha Furl.  Epimenio Foot, MD, PhD, FAAN Certified in Neurology, Clinical Neurophysiology, Sleep Medicine, Pain Medicine and Neuroimaging Director, Multiple Sclerosis Center at [facility]

## 2021-09-05 NOTE — Telephone Encounter (Signed)
Kesimpta denied.  "The requested drug is not authorized under the plan. The preferred medications are generic glatiramer injection and generic dimethyl fumarate delayed release capsules. Coverage cannot be authorized at this time."

## 2021-09-07 NOTE — Telephone Encounter (Signed)
Appeal letter signed and completed and faxed to the number provided. Will wait determination.  Start form has also been faxed to [employer].

## 2021-09-08 ENCOUNTER — Other Ambulatory Visit: Payer: Self-pay

## 2021-09-08 ENCOUNTER — Encounter: Payer: Self-pay | Admitting: Neurology

## 2021-09-08 ENCOUNTER — Other Ambulatory Visit: Payer: Self-pay | Admitting: Family Medicine

## 2021-09-08 DIAGNOSIS — F411 Generalized anxiety disorder: Secondary | ICD-10-CM

## 2021-09-08 LAB — CYTOLOGY - PAP: Diagnosis: HIGH — AB

## 2021-09-08 NOTE — Telephone Encounter (Signed)
LOV for anxiety 01/06/21 Last refill 07/12/21, #20, 1 refill Not to exceed 4 additional fills before 10/11/2021  Please review. Thanks!

## 2021-09-08 NOTE — Telephone Encounter (Signed)
error 

## 2021-09-10 LAB — CBC WITH DIFFERENTIAL/PLATELET
Basophils Absolute: 0.1 10*3/uL (ref 0.0–0.2)
Basos: 1 %
EOS (ABSOLUTE): 1 10*3/uL — ABNORMAL HIGH (ref 0.0–0.4)
Eos: 9 %
Hematocrit: 38.9 % (ref 34.0–46.6)
Hemoglobin: 13 g/dL (ref 11.1–15.9)
Immature Grans (Abs): 0 10*3/uL (ref 0.0–0.1)
Immature Granulocytes: 0 %
Lymphocytes Absolute: 4.1 10*3/uL — ABNORMAL HIGH (ref 0.7–3.1)
Lymphs: 35 %
MCH: 32.2 pg (ref 26.6–33.0)
MCHC: 33.4 g/dL (ref 31.5–35.7)
MCV: 96 fL (ref 79–97)
Monocytes Absolute: 1 10*3/uL — ABNORMAL HIGH (ref 0.1–0.9)
Monocytes: 9 %
Neutrophils Absolute: 5.6 10*3/uL (ref 1.4–7.0)
Neutrophils: 46 %
Platelets: 366 10*3/uL (ref 150–450)
RBC: 4.04 x10E6/uL (ref 3.77–5.28)
RDW: 13.9 % (ref 11.7–15.4)
WBC: 12 10*3/uL — ABNORMAL HIGH (ref 3.4–10.8)

## 2021-09-10 LAB — IGG, IGA, IGM
IgA/Immunoglobulin A, Serum: 128 mg/dL (ref 87–352)
IgG (Immunoglobin G), Serum: 898 mg/dL (ref 586–1602)
IgM (Immunoglobulin M), Srm: 28 mg/dL (ref 26–217)

## 2021-09-10 LAB — COMPREHENSIVE METABOLIC PANEL
ALT: 14 IU/L (ref 0–32)
AST: 10 IU/L (ref 0–40)
Albumin/Globulin Ratio: 1.6 (ref 1.2–2.2)
Albumin: 3.7 g/dL — ABNORMAL LOW (ref 3.8–4.8)
Alkaline Phosphatase: 85 IU/L (ref 44–121)
BUN/Creatinine Ratio: 16 (ref 9–23)
BUN: 14 mg/dL (ref 6–24)
Bilirubin Total: <0.2 mg/dL (ref 0.0–1.2)
CO2: 21 mmol/L (ref 20–29)
Calcium: 8.7 mg/dL (ref 8.7–10.2)
Chloride: 108 mmol/L — ABNORMAL HIGH (ref 96–106)
Creatinine, Ser: 0.89 mg/dL (ref 0.57–1.00)
Globulin, Total: 2.3 g/dL (ref 1.5–4.5)
Glucose: 76 mg/dL (ref 70–99)
Potassium: 4.3 mmol/L (ref 3.5–5.2)
Sodium: 141 mmol/L (ref 134–144)
Total Protein: 6 g/dL (ref 6.0–8.5)
eGFR: 83 mL/min/{1.73_m2} (ref >59)

## 2021-09-10 LAB — QUANTIFERON-TB GOLD PLUS
QuantiFERON Mitogen Value: >10 IU/mL
QuantiFERON Nil Value: 0.19 IU/mL
QuantiFERON TB1 Ag Value: 0.51 IU/mL
QuantiFERON TB2 Ag Value: 0.45 IU/mL
QuantiFERON-TB Gold Plus: NEGATIVE

## 2021-09-10 LAB — HIV ANTIBODY (ROUTINE TESTING W REFLEX): HIV Screen 4th Generation wRfx: NONREACTIVE

## 2021-09-10 LAB — HEPATITIS B SURFACE ANTIBODY,QUALITATIVE: Hep B Surface Ab, Qual: NONREACTIVE

## 2021-09-10 LAB — HEPATITIS B CORE ANTIBODY, TOTAL: Hep B Core Total Ab: NEGATIVE

## 2021-09-10 LAB — HEPATITIS C ANTIBODY: Hep C Virus Ab: <0.1 s/co ratio (ref 0.0–0.9)

## 2021-09-10 LAB — HEPATITIS B SURFACE ANTIGEN: Hepatitis B Surface Ag: NEGATIVE

## 2021-09-10 LAB — VARICELLA ZOSTER ANTIBODY, IGG: Varicella zoster IgG: 567 index (ref >165)

## 2021-09-12 ENCOUNTER — Encounter: Payer: Self-pay | Admitting: Family Medicine

## 2021-09-12 ENCOUNTER — Other Ambulatory Visit: Payer: Self-pay | Admitting: Obstetrics & Gynecology

## 2021-09-12 ENCOUNTER — Telehealth: Payer: Self-pay

## 2021-09-12 DIAGNOSIS — N871 Moderate cervical dysplasia: Secondary | ICD-10-CM

## 2021-09-12 NOTE — Telephone Encounter (Signed)
Called patient to schedule LEEp w Phylliss Blakes 11/22  H&P 11/17 @ 2:10   Pre-admit phone call appointment to be requested - date and time will be included on H&P paper work. Also all appointments will be updated on pt MyChart. Explained that this appointment has a call window. Based on the time scheduled will indicate if the call will be received within a 4 hour window before 1:00 or after.  Advised that pt may also receive calls from the hospital pharmacy and pre-service center.  Confirmed pt has [employer] as Editor, commissioning. No secondary insurance.

## 2021-09-12 NOTE — Telephone Encounter (Signed)
-----   Message from [name], MD sent at 09/09/2021  8:01 AM EST ----- Regarding: Surgery Surgery Booking Request Patient Full Name:  Virginia [name]  MRN: [id number]  DOB: [birth year]  Surgeon: Letitia Libra, MD  Requested Surgery Date and Time: any Primary Diagnosis AND Code: Cervical Dysplasia, high grade Secondary Diagnosis and Code:  Surgical Procedure: LEEP RNFA Requested?: No L&D Notification: No Admission Status: same day surgery Length of Surgery: 25 min Special Case Needs: No H&P: Yes Phone Interview???:  Yes Interpreter: No Medical Clearance:  No Special Scheduling Instructions: No Any known health/anesthesia issues, diabetes, sleep apnea, latex allergy, defibrillator/pacemaker?: No Acuity: P2   (P1 highest, P2 delay may cause harm, P3 low, elective gyn, P4 lowest) Post op follow up visits: yes, tele

## 2021-09-13 ENCOUNTER — Other Ambulatory Visit: Payer: Self-pay

## 2021-09-13 MED ORDER — MECLIZINE HCL 25 MG PO TABS: 25.0000 mg | ORAL_TABLET | Freq: Three times a day (TID) | ORAL | 11 refills | Status: AC | PRN

## 2021-09-14 NOTE — Telephone Encounter (Signed)
Received notice that the appeal for kesimpta was also denied. Case ID: [id number]. I have reached out to [employer] for further recommendations.

## 2021-09-14 NOTE — Telephone Encounter (Signed)
Kesimpta appeal has also been denied. Decision dated 09/09/2021. Case ID: [id number].  I have reached out to [employer]. They will refer her to the patient assistance program.

## 2021-09-15 ENCOUNTER — Encounter: Payer: Self-pay | Admitting: Obstetrics & Gynecology

## 2021-09-15 ENCOUNTER — Ambulatory Visit (INDEPENDENT_AMBULATORY_CARE_PROVIDER_SITE_OTHER): Payer: BC Managed Care – PPO | Admitting: Obstetrics & Gynecology

## 2021-09-15 ENCOUNTER — Other Ambulatory Visit: Payer: Self-pay

## 2021-09-15 VITALS — BP 122/80 | Ht 64.0 in | Wt 235.0 lb

## 2021-09-15 DIAGNOSIS — N871 Moderate cervical dysplasia: Secondary | ICD-10-CM

## 2021-09-15 NOTE — Patient Instructions (Signed)
Loop Electrosurgical Excision Procedure Loop electrosurgical excision procedure (LEEP) is the cutting and removal (excision) of tissue from the cervix. The cervix is the bottom part of the uterus that opens into the vagina. The tissue that is removed from the cervix is examined to see if there are cancer cells or cells that might turn into cancer (precancerous cells). LEEP may be done when: You have abnormal bleeding from your cervix. You have an abnormal Pap test result. Your health care provider finds abnormalities on your cervix during an exam. LEEP typically only takes a few minutes and is often done in the health care provider's office. The procedure is safe for women who are trying to get pregnant. The procedure is usually not done during a menstrual period or during pregnancy. Tell a health care provider about: Any allergies you have. All medicines you are taking, including vitamins, herbs, eye drops, creams, and over-the-counter medicines. Any problems you or family members have had with anesthetic medicines. Any bleeding problems you have. Any medical conditions you have or have had. This includes current or past vaginal infections, such as herpes or STIs (sexually transmitted infections). Whether you are pregnant or may be pregnant. If you are having vaginal bleeding on the day of the procedure. What are the risks? Generally, this is a safe procedure. However, problems may occur, including: Infection. Bleeding. Allergic reactions to medicines. Changes or scarring in the cervix. Damage to nearby structures or organs. Increased risk of early (preterm) labor in future pregnancies. What happens before the procedure? Ask your health care provider about: Changing or stopping your regular medicines. This is especially important if you are taking diabetes medicines or blood thinners. Taking medicines such as aspirin and ibuprofen. These medicines can thin your blood. Do not take these  medicines unless your health care provider tells you to take them. Taking over-the-counter medicines, vitamins, herbs, and supplements. Your health care provider may recommend that you take pain medicine before the procedure. Ask your health care provider if you should plan to have a responsible adult take you home after the procedure. What happens during the procedure?  An instrument called a speculum will be placed in your vagina. This will allow your health care provider to see your cervix. You will be given a medicine to numb the area (local anesthetic). The medicine will be injected into your cervix and the surrounding area. A solution will be applied to your cervix. This solution will help the health care provider find the abnormal cells that need to be removed. A thin wire loop will be passed through your vagina to your cervix. The wire will remove layers of abnormal cervical cells. The wire will burn (cauterize) the cervical tissue with an electrical current during cell removal. Open blood vessels will be cauterized to prevent bleeding. You might feel some pressure, aching, and cramping. If you feel like you will faint during the procedure, tell your health care provider right away. A paste may be applied to the cauterized area of your cervix to help control bleeding. The sample of cervical tissue will be sent to a lab and looked at under a microscope. The procedure may vary among health care providers and hospitals. What can I expect after the procedure? After the procedure, it is common to have: Mild abdominal cramps that may last for up to 1 week. A small amount of pink-tinged or bloody vaginal discharge, including light to moderate bleeding, for 1-2 weeks. A brown- or black-colored discharge coming from your   vagina, if a paste was used on the cervix to control bleeding. °It is up to you to get the results of your procedure. Ask your health care provider, or the department that is  doing the procedure, when your results will be ready. °Follow these instructions at home: °Take over-the-counter and prescription medicines only as told by your health care provider. °Return to your normal activities as told by your health care provider. Ask your health care provider what activities are safe for you. °Do not put anything in your vagina for 2 weeks after the procedure or until your health care provider says that it is okay. This includes tampons, creams, and douches. °Do not have sex until your health care provider approves. °Keep all follow-up visits. This is important. °Contact a health care provider if: °You have a fever or chills. °You feel very weak. °You have blood clots or bleeding that is heavier than a normal menstrual period. Bleeding that soaks a pad in less than 1 hour is considered heavy bleeding. °You develop a bad-smelling discharge from your vagina. °You have severe abdominal pain or cramping. °Summary °Loop electrosurgical excision procedure (LEEP) is the removal of tissue from the cervix. The removed tissue will be checked for precancerous cells or cancer cells. °LEEP typically only takes a few minutes and is often done in your health care provider's office. °Do not put anything in your vagina for 2 weeks after the procedure or until your health care provider says that it is okay. This includes tampons, creams, and douches. °Ask your health care provider, or the department that is doing the procedure, when your results will be ready. °This information is not intended to replace advice given to you by your health care provider. Make sure you discuss any questions you have with your health care provider. °Document Revised: 03/23/2021 Document Reviewed: 03/23/2021 °Elsevier Patient Education © 2022 [employer] ° °

## 2021-09-15 NOTE — H&P (View-Only) (Signed)
   PRE-OPERATIVE HISTORY AND PHYSICAL EXAM  HPI:  Virginia [name] is a 40 y.o. G1P1001 Patient's last menstrual period was 08/16/2021.; she is being admitted for surgery related to  cervical dysplasia; recent PAP abnormal again w [facility] .  Her prior PAP was 7 months ago and was abnormal: LGSIL . She has had a prior colposcopy. Prior biopsies (if done) were  CIN II.  She then had Cryo treatment to cervix 6 mos ago .  PMHx: Past Medical History:  Diagnosis Date   Anxiety    Headache    Insomnia    Multiple sclerosis (HCC)    Neuromuscular disorder (HCC)    Phreesia 06/30/2020   Nystagmus 02/15/2017   Syncope and collapse    History reviewed. No pertinent surgical history. Family History  Problem Relation Age of Onset   Emphysema Mother    Osteoarthritis Mother    Anxiety disorder Mother    Uterine cancer Mother 30   Other Father    Suicidality Father    ALS Maternal Grandmother    Colon cancer Neg Hx    Colon polyps Neg Hx    Esophageal cancer Neg Hx    Rectal cancer Neg Hx    Stomach cancer Neg Hx    Social History   Tobacco Use   Smoking status: Former    Types: E-cigarettes   Smokeless tobacco: Never  Substance Use Topics   Alcohol use: Yes    Alcohol/week: 0.0 standard drinks    Comment: rarely   Drug use: No    Current Outpatient Medications:    AIMOVIG 140 MG/ML SOAJ, INJECT 140 MG INTO THE SKIN EVERY 30 (THIRTY) DAYS., Disp: 1 mL, Rfl: 5   amitriptyline (ELAVIL) 50 MG tablet, TAKE 1 TABLET BY MOUTH EVERY DAY AT BEDTIME AS NEEDED FOR SLEEP, Disp: 30 tablet, Rfl: 8   atenolol (TENORMIN) 50 MG tablet, TAKE 1 TABLET BY MOUTH EVERY DAY, Disp: 30 tablet, Rfl: 11   baclofen (LIORESAL) 10 MG tablet, TAKE 2TABLETS BY MOUTH 3 TIMES A DAY FOR SPASTICITY, Disp: 540 tablet, Rfl: 3   clonazePAM (KLONOPIN) 1 MG tablet, TAKE 1 TABLET BY MOUTH TWICE A DAY AS NEEDED FOR ANXIETY, Disp: 20 tablet, Rfl: 1   diltiazem 2 % GEL, Apply 1 application topically 3 (three) times daily.,  Disp: 30 g, Rfl: 1   DULoxetine (CYMBALTA) 60 MG capsule, TAKE 1 CAPSULE BY MOUTH EVERY DAY, Disp: 30 capsule, Rfl: 3   gabapentin (NEURONTIN) 600 MG tablet, TAKE 1 TABLET (600 MG TOTAL) BY MOUTH 4 (FOUR) TIMES DAILY., Disp: 120 tablet, Rfl: 11   HAILEY 24 FE 1-20 MG-MCG(24) tablet, Take 1 tablet by mouth daily as needed., Disp: , Rfl:    HYDROcodone bit-homatropine (HYCODAN) 5-1.5 MG/5ML syrup, Take 5 mLs by mouth every 8 (eight) hours as needed for cough., Disp: 120 mL, Rfl: 0   Insulin Pen Needle 32G X 4 MM MISC, USE AS DIRECTED TO INJECT SAXENDA SQ QD., Disp: 100 each, Rfl: 1   Liraglutide -Weight Management (SAXENDA) 18 MG/3ML SOPN, Inject 0.6 mg into the skin daily. Increase dose by 06mg SQ QW until max dose of 3mg SQ QD is reached., Disp: 6 mL, Rfl: 5   meclizine (ANTIVERT) 25 MG tablet, Take 1 tablet (25 mg total) by mouth 3 (three) times daily as needed for dizziness., Disp: 30 tablet, Rfl: 11   medroxyPROGESTERone (DEPO-PROVERA) 150 MG/ML injection, Inject 1 mL (150 mg total) into the muscle every 3 (three) months., Disp:   1 mL, Rfl: 3   natalizumab (TYSABRI) 300 MG/15ML injection, Inject 300 mg into the vein every 28 (twenty-eight) days., Disp: , Rfl:    pantoprazole (PROTONIX) 40 MG tablet, Take 1 tablet (40 mg total) by mouth 2 (two) times daily., Disp: 60 tablet, Rfl: 5   predniSONE (DELTASONE) 20 MG tablet, 3 tabs poqday 1-2, 2 tabs poqday 3-4, 1 tab poqday 5-6, Disp: 12 tablet, Rfl: 0   sucralfate (CARAFATE) 1 g tablet, TAKE 1 TABLET BY MOUTH 4 TIMES DAILY - WITH MEALS AND AT BEDTIME., Disp: 120 tablet, Rfl: 1   SUMAtriptan (IMITREX) 50 MG tablet, TAKE 1 TABLET EVERY 2 HRS AS NEEDED FOR MIGRAINE. MAY REPEAT IN 2 HRS IF HEADACHE PERSISTS OR RECURS, Disp: 10 tablet, Rfl: 5   temazepam (RESTORIL) 30 MG capsule, TAKE 1 CAPSULE (30 MG TOTAL) BY MOUTH AT BEDTIME AS NEEDED FOR SLEEP (DO NOT TAKE WITH KLONOPIN)., Disp: 30 capsule, Rfl: 3 Allergies: Codeine, Onion, and Topamax  [topiramate]  Review of Systems  Constitutional:  Negative for chills, fever and malaise/fatigue.  HENT:  Negative for congestion, sinus pain and sore throat.   Eyes:  Negative for blurred vision and pain.  Respiratory:  Negative for cough and wheezing.   Cardiovascular:  Negative for chest pain and leg swelling.  Gastrointestinal:  Negative for abdominal pain, constipation, diarrhea, heartburn, nausea and vomiting.  Genitourinary:  Negative for dysuria, frequency, hematuria and urgency.  Musculoskeletal:  Negative for back pain, joint pain, myalgias and neck pain.  Skin:  Negative for itching and rash.  Neurological:  Negative for dizziness, tremors and weakness.  Endo/Heme/Allergies:  Does not bruise/bleed easily.  Psychiatric/Behavioral:  Negative for depression. The patient is not nervous/anxious and does not have insomnia.    Objective: BP 122/80   Ht 5' 4" (1.626 m)   Wt 235 lb (106.6 kg)   LMP 08/16/2021   BMI 40.34 kg/m   Filed Weights   09/15/21 1403  Weight: 235 lb (106.6 kg)   Physical Exam Constitutional:      General: She is not in acute distress.    Appearance: She is well-developed.  HENT:     Head: Normocephalic and atraumatic. No laceration.     Right Ear: Hearing normal.     Left Ear: Hearing normal.     Mouth/Throat:     Pharynx: Uvula midline.  Eyes:     Pupils: Pupils are equal, round, and reactive to light.  Neck:     Thyroid: No thyromegaly.  Cardiovascular:     Rate and Rhythm: Normal rate and regular rhythm.     Heart sounds: No murmur heard.   No friction rub. No gallop.  Pulmonary:     Effort: Pulmonary effort is normal. No respiratory distress.     Breath sounds: Normal breath sounds. No wheezing.  Abdominal:     General: Bowel sounds are normal. There is no distension.     Palpations: Abdomen is soft.     Tenderness: There is no abdominal tenderness. There is no rebound.  Musculoskeletal:        General: Normal range of motion.      Cervical back: Normal range of motion and neck supple.  Neurological:     Mental Status: She is alert and oriented to person, place, and time.     Cranial Nerves: No cranial nerve deficit.  Skin:    General: Skin is warm and dry.  Psychiatric:        Judgment: Judgment normal.    Vitals reviewed.    Assessment: 1. CIN II (cervical intraepithelial neoplasia II)   Refractory to Cryo therapy to cervix Plan LEEP  I have had a careful discussion with this patient about all the options available and the risk/benefits of each. I have fully informed this patient that surgery may subject her to a variety of discomforts and risks: She understands that most patients have surgery with little difficulty, but problems can happen ranging from minor to fatal. These include nausea, vomiting, pain, bleeding, infection, poor healing, hernia, or formation of adhesions. Unexpected reactions may occur from any drug or anesthetic given. Unintended injury may occur to other pelvic or abdominal structures such as Fallopian tubes, ovaries, bladder, ureter (tube from kidney to bladder), or bowel. Nerves going from the pelvis to the legs may be injured. Any such injury may require immediate or later additional surgery to correct the problem. Excessive blood loss requiring transfusion is very unlikely but possible. Dangerous blood clots may form in the legs or lungs. Physical and sexual activity will be restricted in varying degrees for an indeterminate period of time but most often 2-6 weeks.  Finally, she understands that it is impossible to list every possible undesirable effect and that the condition for which surgery is done is not always cured or significantly improved, and in rare cases may be even worse.Ample time was given to answer all questions.  Annamarie Major, MD, [facility], [facility] 09/15/2021  2:16 PM

## 2021-09-15 NOTE — Progress Notes (Signed)
PRE-OPERATIVE HISTORY AND PHYSICAL EXAM  HPI:  Virginia [name] is a 40 y.o. G1P1001 Patient's last menstrual period was 08/16/2021.; she is being admitted for surgery related to  cervical dysplasia; recent PAP abnormal again w [facility] .  Her prior PAP was 7 months ago and was abnormal: LGSIL . She has had a prior colposcopy. Prior biopsies (if done) were  CIN II.  She then had Cryo treatment to cervix 6 mos ago .  PMHx: Past Medical History:  Diagnosis Date   Anxiety    Headache    Insomnia    Multiple sclerosis ([place])    Neuromuscular disorder ([place])    Phreesia 06/30/2020   Nystagmus 02/15/2017   Syncope and collapse    History reviewed. No pertinent surgical history. Family History  Problem Relation Age of Onset   Emphysema Mother    Osteoarthritis Mother    Anxiety disorder Mother    Uterine cancer Mother 68   Other Father    Suicidality Father    ALS Maternal Grandmother    Colon cancer Neg Hx    Colon polyps Neg Hx    Esophageal cancer Neg Hx    Rectal cancer Neg Hx    Stomach cancer Neg Hx    Social History   Tobacco Use   Smoking status: Former    Types: E-cigarettes   Smokeless tobacco: Never  Substance Use Topics   Alcohol use: Yes    Alcohol/week: 0.0 standard drinks    Comment: rarely   Drug use: No    Current Outpatient Medications:    AIMOVIG 140 MG/ML SOAJ, INJECT 140 MG INTO THE SKIN EVERY 30 (THIRTY) DAYS., Disp: 1 mL, Rfl: 5   amitriptyline (ELAVIL) 50 MG tablet, TAKE 1 TABLET BY MOUTH EVERY DAY AT BEDTIME AS NEEDED FOR SLEEP, Disp: 30 tablet, Rfl: 8   atenolol (TENORMIN) 50 MG tablet, TAKE 1 TABLET BY MOUTH EVERY DAY, Disp: 30 tablet, Rfl: 11   baclofen (LIORESAL) 10 MG tablet, TAKE 2TABLETS BY MOUTH 3 TIMES A DAY FOR SPASTICITY, Disp: 540 tablet, Rfl: 3   clonazePAM (KLONOPIN) 1 MG tablet, TAKE 1 TABLET BY MOUTH TWICE A DAY AS NEEDED FOR ANXIETY, Disp: 20 tablet, Rfl: 1   diltiazem 2 % GEL, Apply 1 application topically 3 (three) times daily.,  Disp: 30 g, Rfl: 1   DULoxetine (CYMBALTA) 60 MG capsule, TAKE 1 CAPSULE BY MOUTH EVERY DAY, Disp: 30 capsule, Rfl: 3   gabapentin (NEURONTIN) 600 MG tablet, TAKE 1 TABLET (600 MG TOTAL) BY MOUTH 4 (FOUR) TIMES DAILY., Disp: 120 tablet, Rfl: 11   HAILEY 24 FE 1-20 MG-MCG(24) tablet, Take 1 tablet by mouth daily as needed., Disp: , Rfl:    HYDROcodone bit-homatropine (HYCODAN) 5-1.5 MG/5ML syrup, Take 5 mLs by mouth every 8 (eight) hours as needed for cough., Disp: 120 mL, Rfl: 0   Insulin Pen Needle 32G X 4 MM MISC, USE AS DIRECTED TO INJECT SAXENDA SQ QD., Disp: 100 each, Rfl: 1   Liraglutide -Weight Management (SAXENDA) 18 MG/3ML SOPN, Inject 0.6 mg into the skin daily. Increase dose by 06mg  SQ QW until max dose of 3mg  SQ QD is reached., Disp: 6 mL, Rfl: 5   meclizine (ANTIVERT) 25 MG tablet, Take 1 tablet (25 mg total) by mouth 3 (three) times daily as needed for dizziness., Disp: 30 tablet, Rfl: 11   medroxyPROGESTERone (DEPO-PROVERA) 150 MG/ML injection, Inject 1 mL (150 mg total) into the muscle every 3 (three) months., Disp:  1 mL, Rfl: 3   natalizumab (TYSABRI) 300 MG/15ML injection, Inject 300 mg into the vein every 28 (twenty-eight) days., Disp: , Rfl:    pantoprazole (PROTONIX) 40 MG tablet, Take 1 tablet (40 mg total) by mouth 2 (two) times daily., Disp: 60 tablet, Rfl: 5   predniSONE (DELTASONE) 20 MG tablet, 3 tabs poqday 1-2, 2 tabs poqday 3-4, 1 tab poqday 5-6, Disp: 12 tablet, Rfl: 0   sucralfate (CARAFATE) 1 g tablet, TAKE 1 TABLET BY MOUTH 4 TIMES DAILY - WITH MEALS AND AT BEDTIME., Disp: 120 tablet, Rfl: 1   SUMAtriptan (IMITREX) 50 MG tablet, TAKE 1 TABLET EVERY 2 HRS AS NEEDED FOR MIGRAINE. MAY REPEAT IN 2 HRS IF HEADACHE PERSISTS OR RECURS, Disp: 10 tablet, Rfl: 5   temazepam (RESTORIL) 30 MG capsule, TAKE 1 CAPSULE (30 MG TOTAL) BY MOUTH AT BEDTIME AS NEEDED FOR SLEEP (DO NOT TAKE WITH KLONOPIN)., Disp: 30 capsule, Rfl: 3 Allergies: Codeine, Onion, and Topamax  [topiramate]  Review of Systems  Constitutional:  Negative for chills, fever and malaise/fatigue.  HENT:  Negative for congestion, sinus pain and sore throat.   Eyes:  Negative for blurred vision and pain.  Respiratory:  Negative for cough and wheezing.   Cardiovascular:  Negative for chest pain and leg swelling.  Gastrointestinal:  Negative for abdominal pain, constipation, diarrhea, heartburn, nausea and vomiting.  Genitourinary:  Negative for dysuria, frequency, hematuria and urgency.  Musculoskeletal:  Negative for back pain, joint pain, myalgias and neck pain.  Skin:  Negative for itching and rash.  Neurological:  Negative for dizziness, tremors and weakness.  Endo/Heme/Allergies:  Does not bruise/bleed easily.  Psychiatric/Behavioral:  Negative for depression. The patient is not nervous/anxious and does not have insomnia.    Objective: BP 122/80   Ht 5\' 4"  (1.626 m)   Wt 235 lb (106.6 kg)   LMP 08/16/2021   BMI 40.34 kg/m   Filed Weights   09/15/21 1403  Weight: 235 lb (106.6 kg)   Physical Exam Constitutional:      General: She is not in acute distress.    Appearance: She is well-developed.  HENT:     Head: Normocephalic and atraumatic. No laceration.     Right Ear: Hearing normal.     Left Ear: Hearing normal.     Mouth/Throat:     Pharynx: Uvula midline.  Eyes:     Pupils: Pupils are equal, round, and reactive to light.  Neck:     Thyroid: No thyromegaly.  Cardiovascular:     Rate and Rhythm: Normal rate and regular rhythm.     Heart sounds: No murmur heard.   No friction rub. No gallop.  Pulmonary:     Effort: Pulmonary effort is normal. No respiratory distress.     Breath sounds: Normal breath sounds. No wheezing.  Abdominal:     General: Bowel sounds are normal. There is no distension.     Palpations: Abdomen is soft.     Tenderness: There is no abdominal tenderness. There is no rebound.  Musculoskeletal:        General: Normal range of motion.      Cervical back: Normal range of motion and neck supple.  Neurological:     Mental Status: She is alert and oriented to person, place, and time.     Cranial Nerves: No cranial nerve deficit.  Skin:    General: Skin is warm and dry.  Psychiatric:        Judgment: Judgment normal.  Vitals reviewed.    Assessment: 1. CIN II (cervical intraepithelial neoplasia II)   Refractory to Cryo therapy to cervix Plan LEEP  I have had a careful discussion with this patient about all the options available and the risk/benefits of each. I have fully informed this patient that surgery may subject her to a variety of discomforts and risks: She understands that most patients have surgery with little difficulty, but problems can happen ranging from minor to fatal. These include nausea, vomiting, pain, bleeding, infection, poor healing, hernia, or formation of adhesions. Unexpected reactions may occur from any drug or anesthetic given. Unintended injury may occur to other pelvic or abdominal structures such as Fallopian tubes, ovaries, bladder, ureter (tube from kidney to bladder), or bowel. Nerves going from the pelvis to the legs may be injured. Any such injury may require immediate or later additional surgery to correct the problem. Excessive blood loss requiring transfusion is very unlikely but possible. Dangerous blood clots may form in the legs or lungs. Physical and sexual activity will be restricted in varying degrees for an indeterminate period of time but most often 2-6 weeks.  Finally, she understands that it is impossible to list every possible undesirable effect and that the condition for which surgery is done is not always cured or significantly improved, and in rare cases may be even worse.Ample time was given to answer all questions.  Annamarie Major, MD, [facility], [facility] 09/15/2021  2:16 PM

## 2021-09-16 ENCOUNTER — Encounter
Admission: RE | Admit: 2021-09-16 | Discharge: 2021-09-16 | Disposition: A | Discharge status: Home / Self Care | Payer: BC Managed Care – PPO | Source: Ambulatory Visit | Attending: Obstetrics & Gynecology | Admitting: Obstetrics & Gynecology

## 2021-09-16 ENCOUNTER — Other Ambulatory Visit: Payer: Self-pay

## 2021-09-16 HISTORY — DX: Ventricular premature depolarization: I49.3

## 2021-09-16 HISTORY — DX: Tachycardia, unspecified: R00.0

## 2021-09-16 HISTORY — DX: Gastro-esophageal reflux disease without esophagitis: K21.9

## 2021-09-16 HISTORY — DX: Atrial premature depolarization: I49.1

## 2021-09-16 NOTE — Patient Instructions (Addendum)
Your procedure is scheduled on:09-20-21 Tuesday Report to the Registration Desk on the 1st floor of the [facility].Then proceed to the 2nd floor Surgery Desk in the Medical [facility] To find out your arrival time, please call [phone] between 1PM - 3PM on:09-19-21 Monday  REMEMBER: Instructions that are not followed completely may result in serious medical risk, up to and including death; or upon the discretion of your surgeon and anesthesiologist your surgery may need to be rescheduled.  Do not eat food after midnight the night before surgery.  No gum chewing, lozengers or hard candies.  You may however, drink CLEAR liquids up to 2 hours before you are scheduled to arrive for your surgery. Do not drink anything within 2 hours of your scheduled arrival time.  Clear liquids include: - water  - apple juice without pulp - gatorade (not RED, PURPLE, OR BLUE) - black coffee or tea (Do NOT add milk or creamers to the coffee or tea) Do NOT drink anything that is not on this list.  TAKE THESE MEDICATIONS THE MORNING OF SURGERY WITH A SIP OF WATER: -baclofen (LIORESAL) 10 MG tablet -gabapentin (NEURONTIN) 600 MG tablet -pantoprazole (PROTONIX) 40 MG tablet-take one the night before and one on the morning of surgery - helps to prevent nausea after surgery.) -You may take clonazePAM (KLONOPIN) 1 MG tablet the day of surgery if needed  One week prior to surgery: Stop Anti-inflammatories (NSAIDS) such as Advil, Aleve, Ibuprofen, Motrin, Naproxen, Naprosyn and Aspirin based products such as Excedrin, Goodys Powder, BC Powder.You may however, take Tylenol if needed for pain up until the day of surgery.  Stop ANY OVER THE COUNTER supplements/vitamins NOW (09-16-21) until after surgery  No Alcohol for 24 hours before or after surgery.  No Smoking including e-cigarettes for 24 hours prior to surgery.  No chewable tobacco products for at least 6 hours prior to surgery.  No nicotine patches on the  day of surgery.  Do not use any "recreational" drugs for at least a week prior to your surgery.  Please be advised that the combination of cocaine and anesthesia may have negative outcomes, up to and including death. If you test positive for cocaine, your surgery will be cancelled.  On the morning of surgery brush your teeth with toothpaste and water, you may rinse your mouth with mouthwash if you wish. Do not swallow any toothpaste or mouthwash.  Do not wear jewelry, make-up, hairpins, clips or nail polish.  Do not wear lotions, powders, or perfumes.   Do not shave body from the neck down 48 hours prior to surgery just in case you cut yourself which could leave a site for infection.  Also, freshly shaved skin may become irritated if using the CHG soap.  Contact lenses, hearing aids and dentures may not be worn into surgery.  Do not bring valuables to the hospital. [facility] is not responsible for any missing/lost belongings or valuables.   Notify your doctor if there is any change in your medical condition (cold, fever, infection).  Wear comfortable clothing (specific to your surgery type) to the hospital.  After surgery, you can help prevent lung complications by doing breathing exercises.  Take deep breaths and cough every 1-2 hours. Your doctor may order a device called an Incentive Spirometer to help you take deep breaths. When coughing or sneezing, hold a pillow firmly against your incision with both hands. This is called "splinting." Doing this helps protect your incision. It also decreases belly discomfort.  If you are being admitted to the hospital overnight, leave your suitcase in the car. After surgery it may be brought to your room.  If you are being discharged the day of surgery, you will not be allowed to drive home. You will need a responsible adult (18 years or older) to drive you home and stay with you that night.   If you are taking public transportation, you  will need to have a responsible adult (18 years or older) with you. Please confirm with your physician that it is acceptable to use public transportation.   Please call the Pre-admissions Testing Dept. at [phone] if you have any questions about these instructions.  Surgery Visitation Policy:  Patients undergoing a surgery or procedure may have one family member or support person with them as long as that person is not COVID-19 positive or experiencing its symptoms.  That person may remain in the waiting area during the procedure and may rotate out with other people.  Inpatient Visitation:    Visiting hours are 7 a.m. to 8 p.m. Up to two visitors ages 16+ are allowed at one time in a patient room. The visitors may rotate out with other people during the day. Visitors must check out when they leave, or other visitors will not be allowed. One designated support person may remain overnight. The visitor must pass COVID-19 screenings, use hand sanitizer when entering and exiting the patient's room and wear a mask at all times, including in the patient's room. Patients must also wear a mask when staff or their visitor are in the room. Masking is required regardless of vaccination status.

## 2021-09-17 ENCOUNTER — Other Ambulatory Visit: Payer: Self-pay | Admitting: Family Medicine

## 2021-09-19 ENCOUNTER — Other Ambulatory Visit: Payer: Self-pay

## 2021-09-19 ENCOUNTER — Encounter
Admission: RE | Admit: 2021-09-19 | Discharge: 2021-09-19 | Disposition: A | Discharge status: Home / Self Care | Payer: BC Managed Care – PPO | Source: Ambulatory Visit | Attending: Obstetrics & Gynecology | Admitting: Obstetrics & Gynecology

## 2021-09-19 DIAGNOSIS — N871 Moderate cervical dysplasia: Secondary | ICD-10-CM | POA: Insufficient documentation

## 2021-09-19 DIAGNOSIS — R000 Tachycardia, unspecified: Secondary | ICD-10-CM | POA: Insufficient documentation

## 2021-09-19 DIAGNOSIS — I493 Ventricular premature depolarization: Secondary | ICD-10-CM | POA: Insufficient documentation

## 2021-09-19 DIAGNOSIS — Z6841 Body Mass Index (BMI) 40.0 and over, adult: Secondary | ICD-10-CM | POA: Diagnosis not present

## 2021-09-19 DIAGNOSIS — Z01818 Encounter for other preprocedural examination: Secondary | ICD-10-CM | POA: Insufficient documentation

## 2021-09-19 DIAGNOSIS — D069 Carcinoma in situ of cervix, unspecified: Secondary | ICD-10-CM | POA: Diagnosis not present

## 2021-09-19 DIAGNOSIS — Z87891 Personal history of nicotine dependence: Secondary | ICD-10-CM | POA: Diagnosis not present

## 2021-09-19 LAB — TYPE AND SCREEN
ABO/RH(D): A NEG
Antibody Screen: NEGATIVE

## 2021-09-19 MED ORDER — LACTATED RINGERS IV SOLN: INTRAVENOUS | Status: DC

## 2021-09-19 MED ORDER — POVIDONE-IODINE 10 % EX SWAB
2.0000 "application " | Freq: Once | CUTANEOUS | Status: AC
  Administered 2021-09-20: 2 via TOPICAL

## 2021-09-19 MED ORDER — CHLORHEXIDINE GLUCONATE 0.12 % MT SOLN: 15.0000 mL | Freq: Once | OROMUCOSAL | Status: AC

## 2021-09-19 MED ORDER — ORAL CARE MOUTH RINSE: 15.0000 mL | Freq: Once | OROMUCOSAL | Status: AC

## 2021-09-20 ENCOUNTER — Other Ambulatory Visit: Payer: Self-pay

## 2021-09-20 ENCOUNTER — Ambulatory Visit: Payer: BC Managed Care – PPO | Admitting: Certified Registered Nurse Anesthetist

## 2021-09-20 ENCOUNTER — Encounter
Admission: RE | Disposition: A | Discharge status: Home / Self Care | Payer: Self-pay | Source: Home / Self Care | Attending: Obstetrics & Gynecology

## 2021-09-20 ENCOUNTER — Ambulatory Visit
Admission: RE | Admit: 2021-09-20 | Discharge: 2021-09-20 | Disposition: A | Discharge status: Home / Self Care | Payer: BC Managed Care – PPO | Attending: Obstetrics & Gynecology | Admitting: Obstetrics & Gynecology

## 2021-09-20 ENCOUNTER — Encounter: Payer: Self-pay | Admitting: Obstetrics & Gynecology

## 2021-09-20 DIAGNOSIS — Z87891 Personal history of nicotine dependence: Secondary | ICD-10-CM | POA: Insufficient documentation

## 2021-09-20 DIAGNOSIS — N871 Moderate cervical dysplasia: Secondary | ICD-10-CM

## 2021-09-20 DIAGNOSIS — R000 Tachycardia, unspecified: Secondary | ICD-10-CM | POA: Insufficient documentation

## 2021-09-20 DIAGNOSIS — D069 Carcinoma in situ of cervix, unspecified: Secondary | ICD-10-CM | POA: Diagnosis not present

## 2021-09-20 DIAGNOSIS — Z6841 Body Mass Index (BMI) 40.0 and over, adult: Secondary | ICD-10-CM | POA: Insufficient documentation

## 2021-09-20 DIAGNOSIS — R0683 Snoring: Secondary | ICD-10-CM | POA: Diagnosis not present

## 2021-09-20 DIAGNOSIS — I493 Ventricular premature depolarization: Secondary | ICD-10-CM | POA: Insufficient documentation

## 2021-09-20 DIAGNOSIS — N879 Dysplasia of cervix uteri, unspecified: Secondary | ICD-10-CM | POA: Diagnosis not present

## 2021-09-20 HISTORY — PX: LEEP: SHX91

## 2021-09-20 LAB — POCT PREGNANCY, URINE: Preg Test, Ur: NEGATIVE

## 2021-09-20 SURGERY — LEEP (LOOP ELECTROSURGICAL EXCISION PROCEDURE)
Anesthesia: General

## 2021-09-20 MED ORDER — OXYCODONE-ACETAMINOPHEN 5-325 MG PO TABS: 1.0000 | ORAL_TABLET | ORAL | Status: DC | PRN

## 2021-09-20 MED ORDER — 0.9 % SODIUM CHLORIDE (POUR BTL) OPTIME
TOPICAL | Status: DC | PRN
  Administered 2021-09-20: 50 mL

## 2021-09-20 MED ORDER — FENTANYL CITRATE (PF) 100 MCG/2ML IJ SOLN
INTRAMUSCULAR | Status: AC
  Filled 2021-09-20: qty 2

## 2021-09-20 MED ORDER — LIDOCAINE-EPINEPHRINE 1 %-1:100000 IJ SOLN
INTRAMUSCULAR | Status: DC | PRN
  Administered 2021-09-20: 20 mL

## 2021-09-20 MED ORDER — LACTATED RINGERS IV SOLN: INTRAVENOUS | Status: DC

## 2021-09-20 MED ORDER — ACETAMINOPHEN 325 MG PO TABS: 650.0000 mg | ORAL_TABLET | ORAL | Status: DC | PRN

## 2021-09-20 MED ORDER — PROPOFOL 10 MG/ML IV BOLUS
INTRAVENOUS | Status: AC
  Filled 2021-09-20: qty 20

## 2021-09-20 MED ORDER — FERRIC SUBSULFATE 259 MG/GM EX SOLN
CUTANEOUS | Status: AC
  Filled 2021-09-20: qty 8

## 2021-09-20 MED ORDER — ONDANSETRON HCL 4 MG/2ML IJ SOLN: 4.0000 mg | Freq: Once | INTRAMUSCULAR | Status: DC | PRN

## 2021-09-20 MED ORDER — MIDAZOLAM HCL 2 MG/2ML IJ SOLN
INTRAMUSCULAR | Status: AC
  Filled 2021-09-20: qty 2

## 2021-09-20 MED ORDER — MORPHINE SULFATE (PF) 2 MG/ML IV SOLN: 1.0000 mg | INTRAVENOUS | Status: DC | PRN

## 2021-09-20 MED ORDER — ONDANSETRON HCL 4 MG/2ML IJ SOLN
INTRAMUSCULAR | Status: AC
  Filled 2021-09-20: qty 2

## 2021-09-20 MED ORDER — CHLORHEXIDINE GLUCONATE 0.12 % MT SOLN
OROMUCOSAL | Status: AC
  Administered 2021-09-20: 15 mL via OROMUCOSAL
  Filled 2021-09-20: qty 15

## 2021-09-20 MED ORDER — MIDAZOLAM HCL 2 MG/2ML IJ SOLN
INTRAMUSCULAR | Status: DC | PRN
Start: 2021-09-20 — End: 2021-09-20
  Administered 2021-09-20: 2 mg via INTRAVENOUS

## 2021-09-20 MED ORDER — LIDOCAINE HCL (PF) 2 % IJ SOLN
INTRAMUSCULAR | Status: AC
  Filled 2021-09-20: qty 5

## 2021-09-20 MED ORDER — IODINE STRONG (LUGOLS) 5 % PO SOLN
ORAL | Status: AC
  Filled 2021-09-20: qty 1

## 2021-09-20 MED ORDER — ACETAMINOPHEN 650 MG RE SUPP
650.0000 mg | RECTAL | Status: DC | PRN
  Filled 2021-09-20: qty 1

## 2021-09-20 MED ORDER — LIDOCAINE-EPINEPHRINE 1 %-1:100000 IJ SOLN
INTRAMUSCULAR | Status: AC
  Filled 2021-09-20: qty 1

## 2021-09-20 MED ORDER — DEXAMETHASONE SODIUM PHOSPHATE 10 MG/ML IJ SOLN
INTRAMUSCULAR | Status: AC
  Filled 2021-09-20: qty 1

## 2021-09-20 MED ORDER — PROPOFOL 10 MG/ML IV BOLUS
INTRAVENOUS | Status: DC | PRN
  Administered 2021-09-20: 170 mg via INTRAVENOUS

## 2021-09-20 MED ORDER — LIDOCAINE HCL (CARDIAC) PF 100 MG/5ML IV SOSY
PREFILLED_SYRINGE | INTRAVENOUS | Status: DC | PRN
  Administered 2021-09-20: 100 mg via INTRAVENOUS

## 2021-09-20 MED ORDER — ACETAMINOPHEN 10 MG/ML IV SOLN: 1000.0000 mg | Freq: Once | INTRAVENOUS | Status: DC | PRN

## 2021-09-20 MED ORDER — ONDANSETRON HCL 4 MG/2ML IJ SOLN
INTRAMUSCULAR | Status: DC | PRN
  Administered 2021-09-20: 4 mg via INTRAVENOUS

## 2021-09-20 MED ORDER — FENTANYL CITRATE (PF) 100 MCG/2ML IJ SOLN
INTRAMUSCULAR | Status: DC | PRN
  Administered 2021-09-20 (×2): 50 ug via INTRAVENOUS

## 2021-09-20 MED ORDER — GLYCOPYRROLATE 0.2 MG/ML IJ SOLN
INTRAMUSCULAR | Status: AC
  Filled 2021-09-20: qty 1

## 2021-09-20 MED ORDER — OXYCODONE-ACETAMINOPHEN 5-325 MG PO TABS: 1.0000 | ORAL_TABLET | ORAL | 0 refills | Status: DC | PRN

## 2021-09-20 MED ORDER — DEXAMETHASONE SODIUM PHOSPHATE 10 MG/ML IJ SOLN
INTRAMUSCULAR | Status: DC | PRN
  Administered 2021-09-20: 10 mg via INTRAVENOUS

## 2021-09-20 MED ORDER — FENTANYL CITRATE (PF) 100 MCG/2ML IJ SOLN: 25.0000 ug | INTRAMUSCULAR | Status: DC | PRN

## 2021-09-20 SURGICAL SUPPLY — 39 items
APL SWBSTK 6 STRL LF DISP (MISCELLANEOUS) ×1
APPLICATOR COTTON TIP 6 STRL (MISCELLANEOUS) ×1 IMPLANT
APPLICATOR COTTON TIP 6IN STRL (MISCELLANEOUS) ×2
BAG COUNTER SPONGE SURGICOUNT (BAG) ×2 IMPLANT
BAG SPNG CNTER NS LX DISP (BAG) ×1
CNTNR SPEC 2.5X3XGRAD LEK (MISCELLANEOUS) ×2
CONT SPEC 4OZ STER OR WHT (MISCELLANEOUS) ×2
CONT SPEC 4OZ STRL OR WHT (MISCELLANEOUS) ×2
CONTAINER SPEC 2.5X3XGRAD LEK (MISCELLANEOUS) ×2 IMPLANT
DRSG TELFA 4X3 1S NADH ST (GAUZE/BANDAGES/DRESSINGS) ×1 IMPLANT
ELECT LEEP BALL 5MM 12CM (MISCELLANEOUS) ×2
ELECT LEEP LOOP 20X10 R2010 (MISCELLANEOUS) ×2
ELECT LOOP 1.0X1.0CM R1010 (MISCELLANEOUS) ×2
ELECT REM PT RETURN 9FT ADLT (ELECTROSURGICAL) ×2
ELECTRODE LEEP BALL 5MM 12CM (MISCELLANEOUS) ×1 IMPLANT
ELECTRODE LEP LOOP 20X10 R2010 (MISCELLANEOUS) ×1 IMPLANT
ELECTRODE LOOP 1.0X1.0CM R1010 (MISCELLANEOUS) ×1 IMPLANT
ELECTRODE REM PT RTRN 9FT ADLT (ELECTROSURGICAL) ×1 IMPLANT
GAUZE 4X4 16PLY ~~LOC~~+RFID DBL (SPONGE) ×2 IMPLANT
GLOVE SURG ENC MOIS LTX SZ8 (GLOVE) ×2 IMPLANT
GOWN STRL REUS W/ TWL LRG LVL3 (GOWN DISPOSABLE) ×1 IMPLANT
GOWN STRL REUS W/ TWL XL LVL3 (GOWN DISPOSABLE) ×1 IMPLANT
GOWN STRL REUS W/TWL LRG LVL3 (GOWN DISPOSABLE) ×2
GOWN STRL REUS W/TWL XL LVL3 (GOWN DISPOSABLE) ×2
HANDLE YANKAUER SUCT BULB TIP (MISCELLANEOUS) ×1 IMPLANT
MANIFOLD NEPTUNE II (INSTRUMENTS) ×2 IMPLANT
NDL SPNL 22GX3.5 QUINCKE BK (NEEDLE) ×1 IMPLANT
NEEDLE SPNL 22GX3.5 QUINCKE BK (NEEDLE) ×2 IMPLANT
NS IRRIG 500ML POUR BTL (IV SOLUTION) ×2 IMPLANT
PACK DNC HYST (MISCELLANEOUS) ×2 IMPLANT
PAD OB MATERNITY 4.3X12.25 (PERSONAL CARE ITEMS) ×2 IMPLANT
PAD PREP 24X41 OB/GYN DISP (PERSONAL CARE ITEMS) ×2 IMPLANT
PENCIL ELECTRO HAND CTR (MISCELLANEOUS) ×2 IMPLANT
SCRUB EXIDINE 4% CHG 4OZ (MISCELLANEOUS) ×2 IMPLANT
STRAP SAFETY 5IN WIDE (MISCELLANEOUS) ×2 IMPLANT
STRAW SMOKE EVAC LEEP 6150 NON (MISCELLANEOUS) ×2 IMPLANT
SYR CONTROL 10ML LL (SYRINGE) ×2 IMPLANT
TUBING CONNECTING 10 (TUBING) ×2 IMPLANT
WATER STERILE IRR 500ML POUR (IV SOLUTION) ×2 IMPLANT

## 2021-09-20 NOTE — Anesthesia Procedure Notes (Signed)
Procedure Name: LMA Insertion Date/Time: 09/20/2021 7:34 AM Performed by: Joanette Gula, Paublo Warshawsky, CRNA Pre-anesthesia Checklist: Patient identified, Emergency Drugs available, Suction available and Patient being monitored Patient Re-evaluated:Patient Re-evaluated prior to induction Oxygen Delivery Method: Circle system utilized Preoxygenation: Pre-oxygenation with 100% oxygen Induction Type: IV induction Ventilation: Mask ventilation without difficulty LMA: LMA inserted LMA Size: 4.0 Number of attempts: 1 Placement Confirmation: positive ETCO2 and breath sounds checked- equal and bilateral Tube secured with: Tape Dental Injury: Teeth and Oropharynx as per pre-operative assessment

## 2021-09-20 NOTE — Transfer of Care (Signed)
Immediate Anesthesia Transfer of Care Note  Patient: Virginia [name]  Procedure(s) Performed: LOOP ELECTROSURGICAL EXCISION PROCEDURE (LEEP)  Patient Location: PACU  Anesthesia Type:General  Level of Consciousness: drowsy  Airway & Oxygen Therapy: Patient Spontanous Breathing and Patient connected to face mask oxygen  Post-op Assessment: Report given to RN and Post -op Vital signs reviewed and stable  Post vital signs: Reviewed and stable  Last Vitals:  Vitals Value Taken Time  BP 136/72   Temp    Pulse 111   Resp 16   SpO2 100     Last Pain:  Vitals:   09/20/21 0613  TempSrc: Oral         Complications: No notable events documented.

## 2021-09-20 NOTE — Op Note (Signed)
Operative Note   SURGERY DATE: 09/20/2021  PRE-OP DIAGNOSIS: Cervical Dysplasia CIN II  POST-OP DIAGNOSIS: same  PROCEDURE: Exam under anesthesia, loop electricosurgical excisional procedure, endocervical curettage  SURGEON: Annamarie Major, MD, FACOG  ANESTHESIA: Choice   ESTIMATED BLOOD LOSS: Minimal   SPECIMENS: ECC, Endocervical LEEP, Ectocervical LEEP tagged 12 o'clock  COMPLICATIONS: None  INDICATIONS: CIN II, refractory to cryo therapy  FINDINGS: EGBUS and vagina within normal limits. Cervix grossly showed no lesions.    PROCEDURE IN DETAIL: After informed consent was obtained, the patient was taken to the operating room where general anesthesia was administered without difficulty. The patient was positioned in the dorsal lithotomy position in Grant stirrups. Time-out was performed. The patient was examined under anesthesia, and the above findings were noted. She was prepped and draped in normal sterile fashion. The patient's bladder was cathaterized with an in and out catheter.  A weighted speculum and deaver were placed inside the patient's vagina. The above mentioned findings were noted. A sound was then used to trace the canal and it's width.  An endocervical curettage was obtained w a kevorkian curette. Using the LEEP device and loops for ecto- and endo- cervical specimens,  the conization was performed, making sure to encompass the entire lesion and transformation zone and to take a deeper second specimen. The cone bed was then cauterized to achieve hemostasis. Speculum was removed and patient awoken from anesthesia.  The patient tolerated the procedure well. Sponge, lap, needle, and instrument counts were correct x 2. The patient was transferred to the recovery room awake, alert and breathing independently in stable condition.  Annamarie Major, MD, [facility], [facility] 09/20/2021  7:59 AM

## 2021-09-20 NOTE — Anesthesia Postprocedure Evaluation (Signed)
Anesthesia Post Note  Patient: Virginia [name]  Procedure(s) Performed: LOOP ELECTROSURGICAL EXCISION PROCEDURE (LEEP)  Patient location during evaluation: PACU Anesthesia Type: General Level of consciousness: awake and alert, oriented and patient cooperative Pain management: pain level controlled Vital Signs Assessment: post-procedure vital signs reviewed and stable Respiratory status: spontaneous breathing, nonlabored ventilation and respiratory function stable Cardiovascular status: blood pressure returned to baseline and stable Postop Assessment: adequate PO intake Anesthetic complications: no   No notable events documented.   Last Vitals:  Vitals:   09/20/21 0844 09/20/21 0853  BP: (!) 144/90 (!) 162/91  Pulse: 93 98  Resp: 13 16  Temp: 36.7 C (!) 36.1 C  SpO2: 97% 98%    Last Pain:  Vitals:   09/20/21 0853  TempSrc: Temporal  PainSc:                  Reed Breech

## 2021-09-20 NOTE — Discharge Instructions (Signed)

## 2021-09-20 NOTE — Anesthesia Preprocedure Evaluation (Addendum)
Anesthesia Evaluation  Patient identified by MRN, date of birth, ID band Patient awake    Reviewed: Allergy & Precautions, NPO status , Patient's Chart, lab work & pertinent test results  History of Anesthesia Complications Negative for: history of anesthetic complications  Airway Mallampati: IV   Neck ROM: Full    Dental no notable dental hx.    Pulmonary Patient abstained from smoking., former smoker (quit 2015),    Pulmonary exam normal breath sounds clear to auscultation       Cardiovascular Exercise Tolerance: Good Normal cardiovascular exam Rhythm:Regular Rate:Normal  Hx tachycardia, PVCs, PACs  ECG 09/19/21: normal   Neuro/Psych  Headaches, PSYCHIATRIC DISORDERS Anxiety  Neuromuscular disease (multiple sclerosis)    GI/Hepatic GERD  ,  Endo/Other  Class 3 obesity  Renal/GU negative Renal ROS     Musculoskeletal   Abdominal   Peds  Hematology negative hematology ROS (+)   Anesthesia Other Findings   Reproductive/Obstetrics CIN II                           Anesthesia Physical Anesthesia Plan  ASA: 3  Anesthesia Plan: General   Post-op Pain Management:    Induction: Intravenous  PONV Risk Score and Plan: 3 and Ondansetron, Dexamethasone and Treatment may vary due to age or medical condition  Airway Management Planned: LMA  Additional Equipment:   Intra-op Plan:   Post-operative Plan: Extubation in OR  Informed Consent: I have reviewed the patients History and Physical, chart, labs and discussed the procedure including the risks, benefits and alternatives for the proposed anesthesia with the patient or authorized representative who has indicated his/her understanding and acceptance.     Dental advisory given  Plan Discussed with: CRNA  Anesthesia Plan Comments: (Anesthetic considerations for multiple sclerosis: closely monitor body temperature to avoid increase  above baseline, avoid succinylcholine, pt may have altered sensitivity to NDMRs.  Patient consented for risks of anesthesia including but not limited to:  - adverse reactions to medications - damage to eyes, teeth, lips or other oral mucosa - nerve damage due to positioning  - sore throat or hoarseness - damage to heart, brain, nerves, lungs, other parts of body or loss of life  Informed patient about role of CRNA in peri- and intra-operative care.  Patient voiced understanding.)      Anesthesia Quick Evaluation

## 2021-09-20 NOTE — Interval H&P Note (Signed)
History and Physical Interval Note:  09/20/2021 7:14 AM  Virginia [name]  has presented today for surgery, with the diagnosis of Cervical Dysplasia, high grade.  The various methods of treatment have been discussed with the patient and family. After consideration of risks, benefits and other options for treatment, the patient has consented to  Procedure(s): LOOP ELECTROSURGICAL EXCISION PROCEDURE (LEEP) (N/A) as a surgical intervention.  The patient's history has been reviewed, patient examined, no change in status, stable for surgery.  I have reviewed the patient's chart and labs.  Questions were answered to the patient's satisfaction.     Letitia Libra

## 2021-09-23 LAB — SURGICAL PATHOLOGY

## 2021-09-27 ENCOUNTER — Encounter (HOSPITAL_COMMUNITY): Payer: Self-pay | Admitting: *Deleted

## 2021-09-27 ENCOUNTER — Encounter: Payer: Self-pay | Admitting: Obstetrics & Gynecology

## 2021-09-27 ENCOUNTER — Emergency Department (HOSPITAL_COMMUNITY)
Admission: EM | Admit: 2021-09-27 | Discharge: 2021-09-28 | Disposition: A | Discharge status: Home / Self Care | Payer: BC Managed Care – PPO | Attending: Emergency Medicine | Admitting: Emergency Medicine

## 2021-09-27 ENCOUNTER — Emergency Department (HOSPITAL_COMMUNITY): Admission: EM | Admit: 2021-09-27 | Payer: BC Managed Care – PPO | Source: Home / Self Care

## 2021-09-27 DIAGNOSIS — K219 Gastro-esophageal reflux disease without esophagitis: Secondary | ICD-10-CM | POA: Insufficient documentation

## 2021-09-27 DIAGNOSIS — Z87891 Personal history of nicotine dependence: Secondary | ICD-10-CM | POA: Diagnosis not present

## 2021-09-27 DIAGNOSIS — N390 Urinary tract infection, site not specified: Secondary | ICD-10-CM | POA: Diagnosis not present

## 2021-09-27 DIAGNOSIS — D72829 Elevated white blood cell count, unspecified: Secondary | ICD-10-CM | POA: Diagnosis not present

## 2021-09-27 DIAGNOSIS — R109 Unspecified abdominal pain: Secondary | ICD-10-CM | POA: Diagnosis not present

## 2021-09-27 DIAGNOSIS — R630 Anorexia: Secondary | ICD-10-CM | POA: Insufficient documentation

## 2021-09-27 DIAGNOSIS — R1031 Right lower quadrant pain: Secondary | ICD-10-CM | POA: Diagnosis not present

## 2021-09-27 LAB — CBC WITH DIFFERENTIAL/PLATELET
Abs Immature Granulocytes: 0.06 10*3/uL (ref 0.00–0.07)
Basophils Absolute: 0.1 10*3/uL (ref 0.0–0.1)
Basophils Relative: 1 %
Eosinophils Absolute: 0.9 10*3/uL — ABNORMAL HIGH (ref 0.0–0.5)
Eosinophils Relative: 7 %
HCT: 42.6 % (ref 36.0–46.0)
Hemoglobin: 14.1 g/dL (ref 12.0–15.0)
Immature Granulocytes: 1 %
Lymphocytes Relative: 18 %
Lymphs Abs: 2.3 10*3/uL (ref 0.7–4.0)
MCH: 32.4 pg (ref 26.0–34.0)
MCHC: 33.1 g/dL (ref 30.0–36.0)
MCV: 97.9 fL (ref 80.0–100.0)
Monocytes Absolute: 0.6 10*3/uL (ref 0.1–1.0)
Monocytes Relative: 5 %
Neutro Abs: 8.6 10*3/uL — ABNORMAL HIGH (ref 1.7–7.7)
Neutrophils Relative %: 68 %
Platelets: 344 10*3/uL (ref 150–400)
RBC: 4.35 MIL/uL (ref 3.87–5.11)
RDW: 14 % (ref 11.5–15.5)
WBC: 12.5 10*3/uL — ABNORMAL HIGH (ref 4.0–10.5)
nRBC: 0 % (ref 0.0–0.2)

## 2021-09-27 LAB — COMPREHENSIVE METABOLIC PANEL
ALT: 20 U/L (ref 0–44)
AST: 12 U/L — ABNORMAL LOW (ref 15–41)
Albumin: 3.6 g/dL (ref 3.5–5.0)
Alkaline Phosphatase: 83 U/L (ref 38–126)
Anion gap: 8 (ref 5–15)
BUN: 14 mg/dL (ref 6–20)
CO2: 24 mmol/L (ref 22–32)
Calcium: 8.6 mg/dL — ABNORMAL LOW (ref 8.9–10.3)
Chloride: 104 mmol/L (ref 98–111)
Creatinine, Ser: 0.92 mg/dL (ref 0.44–1.00)
GFR, Estimated: >60 mL/min (ref >60)
Glucose, Bld: 105 mg/dL — ABNORMAL HIGH (ref 70–99)
Potassium: 4.1 mmol/L (ref 3.5–5.1)
Sodium: 136 mmol/L (ref 135–145)
Total Bilirubin: 0.3 mg/dL (ref 0.3–1.2)
Total Protein: 7 g/dL (ref 6.5–8.1)

## 2021-09-27 LAB — URINALYSIS, ROUTINE W REFLEX MICROSCOPIC
Glucose, UA: NEGATIVE mg/dL
Ketones, ur: NEGATIVE mg/dL
Nitrite: NEGATIVE
Protein, ur: 100 mg/dL — AB
Specific Gravity, Urine: >1.03 — ABNORMAL HIGH (ref 1.005–1.030)
pH: 5 (ref 5.0–8.0)

## 2021-09-27 LAB — PREGNANCY, URINE: Preg Test, Ur: NEGATIVE

## 2021-09-27 LAB — URINALYSIS, MICROSCOPIC (REFLEX): WBC, UA: >50 WBC/hpf (ref 0–5)

## 2021-09-27 LAB — LIPASE, BLOOD: Lipase: 61 U/L — ABNORMAL HIGH (ref 11–51)

## 2021-09-27 MED ORDER — CEPHALEXIN 500 MG PO CAPS
500.0000 mg | ORAL_CAPSULE | Freq: Four times a day (QID) | ORAL | 0 refills | Status: AC
Start: 2021-09-27 — End: 2021-10-04

## 2021-09-27 MED ORDER — FENTANYL CITRATE PF 50 MCG/ML IJ SOSY
50.0000 ug | PREFILLED_SYRINGE | Freq: Once | INTRAMUSCULAR | Status: AC
  Administered 2021-09-27: 50 ug via INTRAVENOUS

## 2021-09-27 NOTE — ED Notes (Signed)
Pt seen upon arrival to [facility] ED, RN was not able to document due to charting issues.  Pt is alert and oriented, no complaints at this time.

## 2021-09-27 NOTE — ED Triage Notes (Signed)
Right lower quadrant pain for 2 days 

## 2021-09-27 NOTE — ED Notes (Signed)
IV secured for transport °

## 2021-09-27 NOTE — ED Provider Notes (Signed)
[facility] EMERGENCY DEPARTMENT Provider Note   CSN: [id number] Arrival date & time: 09/27/21  1536     History Chief Complaint  Patient presents with   Abdominal Pain    Virginia [name] is a 40 y.o. female.  Presents to ER with concern for abdominal pain.  Pain ongoing for about 2 to 3 days.  Pain has been relatively constant, steadily getting worse.  Currently moderate, aching sensation.  No pelvic cramping.  Reports that she had a LEEP procedure 1 week ago and had a small amount of vaginal spotting after the procedure.  No change today.  No nausea or vomiting or diarrhea.  Has had decreased appetite.  No fevers or chills.  No burning with urination.  HPI     Past Medical History:  Diagnosis Date   Anxiety    GERD (gastroesophageal reflux disease)    Headache    Insomnia    Multiple sclerosis ([place])    dx 2017   Neuromuscular disorder ([place])    Phreesia 06/30/2020   Nystagmus 02/15/2017   PAC (premature atrial contraction)    PVC (premature ventricular contraction)    Syncope and collapse    Tachycardia     Patient Active Problem List   Diagnosis Date Noted   Chronic migraine w/o aura w/o status migrainosus, not intractable 09/01/2021   CIN II (cervical intraepithelial neoplasia II) 03/18/2021   LGSIL on Pap smear of cervix 02/28/2021   Vertigo of central origin 06/24/2020   Numbness 06/24/2020   Panic disorder 11/11/2019   Current mild episode of major depressive disorder without prior episode ([place]) 11/11/2019   Snoring 06/20/2018   Excessive daytime sleepiness 06/20/2018   Neck pain 01/01/2018   Palpitations 01/01/2018   Myalgia 09/03/2017   Chronic low back pain without sciatica 09/03/2017   Right leg pain 07/26/2017   Bilateral sciatica 07/12/2017   Nystagmus 02/15/2017   Spell of altered consciousness 07/19/2016   Ataxic gait 05/31/2016   Other fatigue 05/31/2016   Dysesthesia 05/31/2016   High risk medication use 03/31/2016   Insomnia 03/31/2016    Dizziness    Syncope    Multiple sclerosis ([place]) 03/21/2016   Relapsing remitting multiple sclerosis (HCC)    Vertigo    White matter abnormality on MRI of brain 03/20/2016   Faintness     Past Surgical History:  Procedure Laterality Date   LEEP N/A 09/20/2021   Procedure: LOOP ELECTROSURGICAL EXCISION PROCEDURE (LEEP);  Surgeon: Gae Dry, MD;  Location: [facility] ORS;  Service: Gynecology;  Laterality: N/A;   NO PAST SURGERIES       OB History     Gravida  1   Para  1   Term  1   Preterm      AB      Living  1      SAB      IAB      Ectopic      Multiple      Live Births              Family History  Problem Relation Age of Onset   Emphysema Mother    Osteoarthritis Mother    Anxiety disorder Mother    Uterine cancer Mother 27   Other Father    Suicidality Father    ALS Maternal Grandmother    Colon cancer Neg Hx    Colon polyps Neg Hx    Esophageal cancer Neg Hx    Rectal cancer Neg Hx  Stomach cancer Neg Hx     Social History   Tobacco Use   Smoking status: Former    Packs/day: 0.50    Years: 10.00    Pack years: 5.00    Types: Cigarettes    Quit date: 2015    Years since quitting: 7.9   Smokeless tobacco: Never  Vaping Use   Vaping Use: Every day   Substances: Nicotine, Flavoring  Substance Use Topics   Alcohol use: Yes    Alcohol/week: 0.0 standard drinks    Comment: rarely   Drug use: No    Home Medications Prior to Admission medications   Medication Sig Start Date End Date Taking? Authorizing Provider  cephALEXin (KEFLEX) 500 MG capsule Take 1 capsule (500 mg total) by mouth 4 (four) times daily for 7 days. 09/27/21 10/04/21 Yes Kashawn Dirr, Ellwood Dense, MD  AIMOVIG 140 MG/ML SOAJ INJECT 140 MG INTO THE SKIN EVERY 30 (THIRTY) DAYS. 05/13/21   Susy Frizzle, MD  amitriptyline (ELAVIL) 50 MG tablet TAKE 1 TABLET BY MOUTH EVERY DAY AT BEDTIME AS NEEDED FOR SLEEP 09/08/21   Susy Frizzle, MD  atenolol (TENORMIN) 50 MG  tablet TAKE 1 TABLET BY MOUTH EVERY DAY Patient taking differently: Take 50 mg by mouth at bedtime. 08/11/21   Susy Frizzle, MD  baclofen (LIORESAL) 10 MG tablet TAKE 2TABLETS BY MOUTH 3 TIMES A DAY FOR SPASTICITY 09/01/21   Sater, [name], MD  BD PEN NEEDLE NANO 2ND GEN 32G X 4 MM MISC USE AS DIRECTED TO INJECT SAXENDA SUBCUTANEOUSLY EVERY DAY 09/19/21   Susy Frizzle, MD  clonazePAM (KLONOPIN) 1 MG tablet TAKE 1 TABLET BY MOUTH TWICE A DAY AS NEEDED FOR ANXIETY Patient taking differently: Take 1 mg by mouth 2 (two) times daily as needed for anxiety. 09/09/21   Susy Frizzle, MD  diltiazem 2 % GEL Apply 1 application topically 3 (three) times daily. Patient not taking: Reported on 09/16/2021 06/08/21   Levin Erp, PA  DULoxetine (CYMBALTA) 60 MG capsule TAKE 1 CAPSULE BY MOUTH EVERY DAY Patient taking differently: Take 60 mg by mouth at bedtime. 09/08/21   Susy Frizzle, MD  gabapentin (NEURONTIN) 600 MG tablet TAKE 1 TABLET (600 MG TOTAL) BY MOUTH 4 (FOUR) TIMES DAILY. 07/18/21   Sater, [name], MD  HAILEY 24 FE 1-20 MG-MCG(24) tablet Take 1 tablet by mouth daily. 09/07/21   [provider]  HYDROcodone bit-homatropine (HYCODAN) 5-1.5 MG/5ML syrup Take 5 mLs by mouth every 8 (eight) hours as needed for cough. Patient not taking: Reported on 09/16/2021 08/11/21   Susy Frizzle, MD  Liraglutide -Weight Management (SAXENDA) 18 MG/3ML SOPN Inject 0.6 mg into the skin daily. Increase dose by 06mg  SQ QW until max dose of 3mg  SQ QD is reached. 07/25/21   Susy Frizzle, MD  meclizine (ANTIVERT) 25 MG tablet Take 1 tablet (25 mg total) by mouth 3 (three) times daily as needed for dizziness. 09/13/21   Sater, [name], MD  medroxyPROGESTERone (DEPO-PROVERA) 150 MG/ML injection Inject 1 mL (150 mg total) into the muscle every 3 (three) months. Patient not taking: Reported on 09/16/2021 08/31/21   Gae Dry, MD  natalizumab (TYSABRI) 300 MG/15ML injection  Inject 300 mg into the vein every 28 (twenty-eight) days.    [provider]  oxyCODONE-acetaminophen (PERCOCET/ROXICET) 5-325 MG tablet Take 1 tablet by mouth every 4 (four) hours as needed for moderate pain. 09/20/21   Gae Dry, MD  pantoprazole (  PROTONIX) 40 MG tablet Take 1 tablet (40 mg total) by mouth 2 (two) times daily. Patient taking differently: Take 40 mg by mouth as needed. 07/19/21   Susy Frizzle, MD  predniSONE (DELTASONE) 20 MG tablet 3 tabs poqday 1-2, 2 tabs poqday 3-4, 1 tab poqday 5-6 Patient not taking: Reported on 09/16/2021 08/02/21   Susy Frizzle, MD  sucralfate (CARAFATE) 1 g tablet TAKE 1 TABLET BY MOUTH 4 TIMES DAILY - WITH MEALS AND AT BEDTIME. Patient taking differently: as needed. 09/27/20   Susy Frizzle, MD  SUMAtriptan (IMITREX) 50 MG tablet TAKE 1 TABLET EVERY 2 HRS AS NEEDED FOR MIGRAINE. MAY REPEAT IN 2 HRS IF HEADACHE PERSISTS OR RECURS Patient taking differently: Take 50 mg by mouth every 2 (two) hours as needed for migraine. 07/11/21   Sater, [name], MD  temazepam (RESTORIL) 30 MG capsule TAKE 1 CAPSULE (30 MG TOTAL) BY MOUTH AT BEDTIME AS NEEDED FOR SLEEP (DO NOT TAKE WITH KLONOPIN). Patient taking differently: Take 30 mg by mouth at bedtime. 08/04/21   Susy Frizzle, MD    Allergies    Codeine, Onion, and Topamax [topiramate]  Review of Systems   Review of Systems  Constitutional:  Negative for chills and fever.  HENT:  Negative for ear pain and sore throat.   Eyes:  Negative for pain and visual disturbance.  Respiratory:  Negative for cough and shortness of breath.   Cardiovascular:  Negative for chest pain and palpitations.  Gastrointestinal:  Positive for abdominal pain. Negative for vomiting.  Genitourinary:  Negative for dysuria and hematuria.  Musculoskeletal:  Negative for arthralgias and back pain.  Skin:  Negative for color change and rash.  Neurological:  Negative for seizures and syncope.  All other  systems reviewed and are negative.  Physical Exam Updated Vital Signs BP (!) 105/57   Pulse 74   Temp 97.7 F (36.5 C) (Oral)   Resp 18   Wt 107.5 kg   LMP 09/12/2021 (Exact Date)   SpO2 100%   BMI 40.68 kg/m   Physical Exam Vitals and nursing note reviewed.  Constitutional:      General: She is not in acute distress.    Appearance: She is well-developed.  HENT:     Head: Normocephalic and atraumatic.  Eyes:     Conjunctiva/sclera: Conjunctivae normal.  Cardiovascular:     Rate and Rhythm: Normal rate and regular rhythm.     Heart sounds: No murmur heard. Pulmonary:     Effort: Pulmonary effort is normal. No respiratory distress.     Breath sounds: Normal breath sounds.  Abdominal:     Palpations: Abdomen is soft.     Tenderness: There is abdominal tenderness in the right lower quadrant. There is no guarding or rebound.  Musculoskeletal:        General: No swelling.     Cervical back: Neck supple.  Skin:    General: Skin is warm and dry.     Capillary Refill: Capillary refill takes less than 2 seconds.  Neurological:     Mental Status: She is alert.  Psychiatric:        Mood and Affect: Mood normal.    ED Results / Procedures / Treatments   Labs (all labs ordered are listed, but only abnormal results are displayed) Labs Reviewed  CBC WITH DIFFERENTIAL/PLATELET - Abnormal; Notable for the following components:      Result Value   WBC 12.5 (*)    Neutro Abs  8.6 (*)    Eosinophils Absolute 0.9 (*)    All other components within normal limits  COMPREHENSIVE METABOLIC PANEL - Abnormal; Notable for the following components:   Glucose, Bld 105 (*)    Calcium 8.6 (*)    AST 12 (*)    All other components within normal limits  LIPASE, BLOOD - Abnormal; Notable for the following components:   Lipase 61 (*)    All other components within normal limits  URINALYSIS, ROUTINE W REFLEX MICROSCOPIC - Abnormal; Notable for the following components:   Color, Urine STRAW  (*)    APPearance CLOUDY (*)    Specific Gravity, Urine >1.030 (*)    Hgb urine dipstick LARGE (*)    Bilirubin Urine SMALL (*)    Protein, ur 100 (*)    Leukocytes,Ua TRACE (*)    All other components within normal limits  URINALYSIS, MICROSCOPIC (REFLEX) - Abnormal; Notable for the following components:   Bacteria, UA MANY (*)    All other components within normal limits  PREGNANCY, URINE    EKG None  Radiology No results found.  Procedures Procedures   Medications Ordered in ED Medications  fentaNYL (SUBLIMAZE) injection 50 mcg (50 mcg Intravenous Given 09/27/21 1706)    ED Course  I have reviewed the triage vital signs and the nursing notes.  Pertinent labs & imaging results that were available during my care of the patient were reviewed by me and considered in my medical decision making (see chart for details).    MDM Rules/Calculators/A&P                           40 year old lady presents to ER with concern for right lower quadrant pain.  On exam she appears well in no distress with stable vital signs.  Has focal tenderness in the right lower quadrant but no rebound or guarding.  Basic labs noted for leukocytosis.  UA with likely UTI.  Believe patient needs CT scan to rule out appendicitis and evaluate for any obstructing kidney stones.  CT scanner at [facility] not currently operating.  I discussed the case with Dr. Jacqulyn Bath at [facility].  He has accepted the patient here to ER.  At [facility], will need CT abdomen pelvis and reassessment.  If negative for acute pathology, anticipate likely discharge on oral antibiotics for UTI.  Pt has a ride and prefers to go POV, given her stability throughout ER stay, feel going POV is appropriate. RN to call report to [facility].   Final Clinical Impression(s) / ED Diagnoses Final diagnoses:  RLQ abdominal pain  Urinary tract infection without hematuria, site unspecified  Leukocytosis, unspecified type    Rx / DC Orders ED  Discharge Orders          Ordered    cephALEXin (KEFLEX) 500 MG capsule  4 times daily        09/27/21 1836             Milagros Loll, MD 09/27/21 1840

## 2021-09-27 NOTE — ED Notes (Signed)
Report given to [facility] ER charge RN - Asher Muir

## 2021-09-28 ENCOUNTER — Encounter: Payer: Self-pay | Admitting: Family Medicine

## 2021-09-28 ENCOUNTER — Emergency Department (HOSPITAL_COMMUNITY): Payer: BC Managed Care – PPO

## 2021-10-03 ENCOUNTER — Other Ambulatory Visit: Payer: Self-pay

## 2021-10-03 ENCOUNTER — Encounter: Payer: Self-pay | Admitting: Family Medicine

## 2021-10-03 ENCOUNTER — Ambulatory Visit (INDEPENDENT_AMBULATORY_CARE_PROVIDER_SITE_OTHER): Payer: BC Managed Care – PPO | Admitting: Family Medicine

## 2021-10-03 VITALS — BP 112/78 | HR 97 | Temp 97.3°F | Resp 18 | Ht 64.0 in | Wt 240.0 lb

## 2021-10-03 DIAGNOSIS — R1031 Right lower quadrant pain: Secondary | ICD-10-CM | POA: Diagnosis not present

## 2021-10-03 DIAGNOSIS — R102 Pelvic and perineal pain: Secondary | ICD-10-CM | POA: Diagnosis not present

## 2021-10-03 LAB — WET PREP FOR TRICH, YEAST, CLUE

## 2021-10-03 MED ORDER — METRONIDAZOLE 500 MG PO TABS: 500.0000 mg | ORAL_TABLET | Freq: Two times a day (BID) | ORAL | 0 refills | Status: AC

## 2021-10-03 NOTE — Progress Notes (Signed)
Subjective:    Patient ID: Virginia [name], female    DOB: [birth year], 40 y.o.   MRN: [id number]  HPI  Patient was admitted for LEEP on November 22.  Shortly thereafter she developed right lower quadrant abdominal pain.  She is tender to palpation in the right lower quadrant.  There is no nausea vomiting or diarrhea, or constipation.  She is been having vaginal bleeding and vaginal discharge.  She was informed that this would be expected.  Ultimately she went to the emergency room on the 29th.  There she had mild leukocytosis with white blood cell count of 12.  Lipase was 61.  Urinalysis showed pyuria.  She was started on Keflex.  They recommended a CT scan given her right lower quadrant pain however the CT scanner at any 10 was malfunctioning so she was transferred to [facility] where he left after 8 hours.  She continues to have right lower quadrant pain.  Food does not irritate pain.  She is just constantly tender in the right lower quadrant near the right adnexa.  Therefore I performed a pelvic exam today with chaperone present.  On speculum examination, the cervix appears friable and irritated that is healing from her previous procedure.  There is yellow bloody brown discharge coming from the cervix.  However on bimanual examination, there is no cervical motion tenderness.  Palpation of the cervix or the right adnexa does not elicit the pain that the patient was describing. Past Medical History:  Diagnosis Date   Anxiety    GERD (gastroesophageal reflux disease)    Headache    Insomnia    Multiple sclerosis (HCC)    dx 2017   Neuromuscular disorder (HCC)    Phreesia 06/30/2020   Nystagmus 02/15/2017   PAC (premature atrial contraction)    PVC (premature ventricular contraction)    Syncope and collapse    Tachycardia    Past Surgical History:  Procedure Laterality Date   LEEP N/A 09/20/2021   Procedure: LOOP ELECTROSURGICAL EXCISION PROCEDURE (LEEP);  Surgeon: [name], MD;   Location: [facility] ORS;  Service: Gynecology;  Laterality: N/A;   NO PAST SURGERIES     Current Outpatient Medications on File Prior to Visit  Medication Sig Dispense Refill   AIMOVIG 140 MG/ML SOAJ INJECT 140 MG INTO THE SKIN EVERY 30 (THIRTY) DAYS. 1 mL 5   amitriptyline (ELAVIL) 50 MG tablet TAKE 1 TABLET BY MOUTH EVERY DAY AT BEDTIME AS NEEDED FOR SLEEP (Patient taking differently: Take 50 mg by mouth at bedtime.) 30 tablet 8   atenolol (TENORMIN) 50 MG tablet TAKE 1 TABLET BY MOUTH EVERY DAY (Patient taking differently: Take 50 mg by mouth at bedtime.) 30 tablet 11   baclofen (LIORESAL) 10 MG tablet TAKE 2TABLETS BY MOUTH 3 TIMES A DAY FOR SPASTICITY 540 tablet 3   cephALEXin (KEFLEX) 500 MG capsule Take 1 capsule (500 mg total) by mouth 4 (four) times daily for 7 days. 28 capsule 0   clonazePAM (KLONOPIN) 1 MG tablet TAKE 1 TABLET BY MOUTH TWICE A DAY AS NEEDED FOR ANXIETY (Patient taking differently: Take 1 mg by mouth 2 (two) times daily as needed for anxiety.) 20 tablet 1   diltiazem 2 % GEL Apply 1 application topically 3 (three) times daily. 30 g 1   DULoxetine (CYMBALTA) 60 MG capsule TAKE 1 CAPSULE BY MOUTH EVERY DAY (Patient taking differently: Take 60 mg by mouth at bedtime.) 30 capsule 3   gabapentin (NEURONTIN) 600 MG tablet  TAKE 1 TABLET (600 MG TOTAL) BY MOUTH 4 (FOUR) TIMES DAILY. 120 tablet 11   HAILEY 24 FE 1-20 MG-MCG(24) tablet Take 1 tablet by mouth daily.     Liraglutide -Weight Management (SAXENDA) 18 MG/3ML SOPN Inject 0.6 mg into the skin daily. Increase dose by 06mg  SQ QW until max dose of 3mg  SQ QD is reached. 6 mL 5   meclizine (ANTIVERT) 25 MG tablet Take 1 tablet (25 mg total) by mouth 3 (three) times daily as needed for dizziness. 30 tablet 11   medroxyPROGESTERone (DEPO-PROVERA) 150 MG/ML injection Inject 1 mL (150 mg total) into the muscle every 3 (three) months. 1 mL 3   Ofatumumab (KESIMPTA) 20 MG/0.4ML SOAJ Inject 20 mg into the skin every 30 (thirty) days. 20mg   at week 0, 1, 2. 20mg  once monthly starting at week 4.     pantoprazole (PROTONIX) 40 MG tablet Take 1 tablet (40 mg total) by mouth 2 (two) times daily. (Patient taking differently: Take 40 mg by mouth as needed.) 60 tablet 5   sucralfate (CARAFATE) 1 g tablet TAKE 1 TABLET BY MOUTH 4 TIMES DAILY - WITH MEALS AND AT BEDTIME. (Patient taking differently: as needed.) 120 tablet 1   SUMAtriptan (IMITREX) 50 MG tablet TAKE 1 TABLET EVERY 2 HRS AS NEEDED FOR MIGRAINE. MAY REPEAT IN 2 HRS IF HEADACHE PERSISTS OR RECURS (Patient taking differently: Take 50 mg by mouth every 2 (two) hours as needed for migraine.) 10 tablet 5   temazepam (RESTORIL) 30 MG capsule TAKE 1 CAPSULE (30 MG TOTAL) BY MOUTH AT BEDTIME AS NEEDED FOR SLEEP (DO NOT TAKE WITH KLONOPIN). (Patient taking differently: Take 30 mg by mouth at bedtime.) 30 capsule 3   BD PEN NEEDLE NANO 2ND GEN 32G X 4 MM MISC USE AS DIRECTED TO INJECT SAXENDA SUBCUTANEOUSLY EVERY DAY 100 each 1   No current facility-administered medications on file prior to visit.   Allergies  Allergen Reactions   Codeine Other (See Comments)    Hallucinations    Onion     swelling   Topamax [Topiramate]     Was talking out of her head, walked into walls   Social History   Socioeconomic History   Marital status: Married    Spouse name: Not on file   Number of children: Not on file   Years of education: Not on file   Highest education level: Not on file  Occupational History   Not on file  Tobacco Use   Smoking status: Former    Packs/day: 0.50    Years: 10.00    Pack years: 5.00    Types: Cigarettes    Quit date: 2015    Years since quitting: 7.9   Smokeless tobacco: Never  Vaping Use   Vaping Use: Every day   Substances: Nicotine, Flavoring  Substance and Sexual Activity   Alcohol use: Yes    Alcohol/week: 0.0 standard drinks    Comment: rarely   Drug use: No   Sexual activity: Yes  Other Topics Concern   Not on file  Social History  Narrative   Not on file   Social Determinants of Health   Financial Resource Strain: Not on file  Food Insecurity: Not on file  Transportation Needs: Not on file  Physical Activity: Not on file  Stress: Not on file  Social Connections: Not on file  Intimate Partner Violence: Not on file      Review of Systems     Objective:  Physical Exam Exam conducted with a chaperone present.  Constitutional:      Appearance: Normal appearance. She is obese.  Cardiovascular:     Rate and Rhythm: Normal rate and regular rhythm.     Heart sounds: Normal heart sounds.  Pulmonary:     Effort: Pulmonary effort is normal. No respiratory distress.     Breath sounds: Normal breath sounds. No stridor. No wheezing.  Abdominal:     General: Abdomen is flat. Bowel sounds are normal. There is no distension.     Palpations: Abdomen is soft. There is no mass.     Tenderness: There is abdominal tenderness in the right lower quadrant.    Genitourinary:    General: Normal vulva.     Exam position: Lithotomy position.     Pubic Area: No rash.      Labia:        Right: No rash.        Left: No rash.      Vagina: Bleeding present. No vaginal discharge or erythema.     Cervix: Discharge and cervical bleeding present. No cervical motion tenderness.     Uterus: Normal.      Adnexa: Right adnexa normal and left adnexa normal.       Right: No mass or tenderness.         Left: No mass or tenderness.       Lymphadenopathy:     Lower Body: No right inguinal adenopathy. No left inguinal adenopathy.  Neurological:     General: No focal deficit present.     Mental Status: She is alert and oriented to person, place, and time. Mental status is at baseline.     Cranial Nerves: No cranial nerve deficit.        Assessment & Plan:  Pelvic pain - Plan: WET PREP FOR TRICH, YEAST, CLUE  RLQ abdominal pain - Plan: WET PREP FOR TRICH, YEAST, CLUE, CBC with Differential/Platelet, COMPLETE METABOLIC PANEL  WITH GFR Physical exam today does not show an explanation for the patient's pain.  History does not pinpoint a cause for the pain either.  Antibiotics for urinary tract infection have not helped the pain.  There is friability of the cervix but I believe that this is irritation due to previous cervical procedure.  I am unable to reproduce the pain by palpation of the cervix.  There is no cervical motion tenderness.  There is no adnexal tenderness to suggest PID.  Wet prep was performed but I will recommend a CT scan of the abdomen pelvis to rule out any intra-abdominal fluid collection given the persistence of the pain and a leukocytosis.  Patient does not appear toxic.  Therefore I feel that is safe to pursue this as an outpatient as my pretest probability for pelvic inflammatory disease or appendicitis is very low.  Wet prep does show clue cells suggesting bacterial vaginosis but I would not expect them to cause this level of pain.  I will treat this with Flagyl 500 mg twice daily for 7 days and await the results of the CT scan.

## 2021-10-04 LAB — CBC WITH DIFFERENTIAL/PLATELET
Absolute Monocytes: 524 cells/uL (ref 200–950)
Basophils Absolute: 41 cells/uL (ref 0–200)
Basophils Relative: 0.6 %
Eosinophils Absolute: 1194 cells/uL — ABNORMAL HIGH (ref 15–500)
Eosinophils Relative: 17.3 %
HCT: 40.3 % (ref 35.0–45.0)
Hemoglobin: 13.9 g/dL (ref 11.7–15.5)
Lymphs Abs: 1118 cells/uL (ref 850–3900)
MCH: 32.6 pg (ref 27.0–33.0)
MCHC: 34.5 g/dL (ref 32.0–36.0)
MCV: 94.6 fL (ref 80.0–100.0)
MPV: 10 fL (ref 7.5–12.5)
Monocytes Relative: 7.6 %
Neutro Abs: 4023 cells/uL (ref 1500–7800)
Neutrophils Relative %: 58.3 %
Platelets: 306 10*3/uL (ref 140–400)
RBC: 4.26 10*6/uL (ref 3.80–5.10)
RDW: 13 % (ref 11.0–15.0)
Total Lymphocyte: 16.2 %
WBC: 6.9 10*3/uL (ref 3.8–10.8)

## 2021-10-04 LAB — COMPLETE METABOLIC PANEL WITH GFR
AG Ratio: 1.4 (calc) (ref 1.0–2.5)
ALT: 14 U/L (ref 6–29)
AST: 13 U/L (ref 10–30)
Albumin: 3.6 g/dL (ref 3.6–5.1)
Alkaline phosphatase (APISO): 77 U/L (ref 31–125)
BUN: 12 mg/dL (ref 7–25)
CO2: 23 mmol/L (ref 20–32)
Calcium: 8.6 mg/dL (ref 8.6–10.2)
Chloride: 108 mmol/L (ref 98–110)
Creat: 0.91 mg/dL (ref 0.50–0.99)
Globulin: 2.6 g/dL (calc) (ref 1.9–3.7)
Glucose, Bld: 120 mg/dL — ABNORMAL HIGH (ref 65–99)
Potassium: 3.7 mmol/L (ref 3.5–5.3)
Sodium: 141 mmol/L (ref 135–146)
Total Bilirubin: 0.3 mg/dL (ref 0.2–1.2)
Total Protein: 6.2 g/dL (ref 6.1–8.1)
eGFR: 81 mL/min/{1.73_m2} (ref >=60)

## 2021-10-05 ENCOUNTER — Encounter: Payer: Self-pay | Admitting: Family Medicine

## 2021-10-17 ENCOUNTER — Other Ambulatory Visit: Payer: Self-pay | Admitting: Family Medicine

## 2021-10-17 DIAGNOSIS — F411 Generalized anxiety disorder: Secondary | ICD-10-CM

## 2021-10-26 ENCOUNTER — Encounter: Payer: Self-pay | Admitting: Family Medicine

## 2021-10-26 NOTE — Progress Notes (Signed)
(  Key: [id number]) Saxenda 18MG /3ML pen-injectors   Form [employer] Electronic PA Form (2017 NCPDP) Created 3 days ago Sent to Plan 2 minutes ago Plan Response 1 minute ago Submit Clinical Questions less than a minute ago Determination Favorable less than a minute ago Your prior authorization for 08-19-2005 has been approved! MORE INFO For eligible patients, copay assistance may be available. To learn more and be redirected to the [employer] website, click on the "More Info" button to the right. Please also note that you may need to schedule a follow-up visit with your patient prior to the expiration of this prior authorization, as updated patient weight may be required for reauthorization.  Message from plan: CaseId:[id number];Status:Approved;Review Type:Prior Auth;Coverage Start Date:09/26/2021;Coverage End Date:10/26/2022;

## 2021-10-27 ENCOUNTER — Ambulatory Visit
Admission: RE | Admit: 2021-10-27 | Discharge: 2021-10-27 | Disposition: A | Discharge status: Home / Self Care | Payer: BC Managed Care – PPO | Source: Ambulatory Visit | Attending: Family Medicine | Admitting: Family Medicine

## 2021-10-27 DIAGNOSIS — N281 Cyst of kidney, acquired: Secondary | ICD-10-CM | POA: Diagnosis not present

## 2021-10-27 DIAGNOSIS — R1031 Right lower quadrant pain: Secondary | ICD-10-CM

## 2021-10-27 MED ORDER — IOPAMIDOL (ISOVUE-M 300) INJECTION 61%
15.0000 mL | Freq: Once | INTRAMUSCULAR | Status: AC | PRN
  Administered 2021-10-27: 16:00:00 15 mL via INTRATHECAL

## 2021-11-01 ENCOUNTER — Ambulatory Visit: Payer: BC Managed Care – PPO | Admitting: Family Medicine

## 2021-11-12 ENCOUNTER — Other Ambulatory Visit: Payer: Self-pay | Admitting: Family Medicine

## 2021-11-14 NOTE — Telephone Encounter (Signed)
Rx for Amimovig has not been filled since 05/13/21 and had 5 refills. Based on this, pt has not been taking this medication as directed.  Please advise, thanks!

## 2021-12-06 ENCOUNTER — Other Ambulatory Visit: Payer: Self-pay | Admitting: Family Medicine

## 2021-12-15 ENCOUNTER — Telehealth: Payer: Self-pay | Admitting: Neurology

## 2021-12-15 NOTE — Telephone Encounter (Signed)
[name] is asking if clinical team can reach out to pt to have her to call them for updated insurance information for the medication K[employer]im[employer].

## 2021-12-27 ENCOUNTER — Other Ambulatory Visit: Payer: Self-pay | Admitting: Obstetrics & Gynecology

## 2021-12-28 ENCOUNTER — Ambulatory Visit: Payer: BC Managed Care – PPO | Admitting: Obstetrics & Gynecology

## 2021-12-28 ENCOUNTER — Other Ambulatory Visit: Payer: Self-pay | Admitting: Family Medicine

## 2021-12-28 DIAGNOSIS — F411 Generalized anxiety disorder: Secondary | ICD-10-CM

## 2021-12-28 NOTE — Telephone Encounter (Signed)
LOV 10/03/21 ?Last refill ?Clonazepam  10/17/21, #20, 1 refill ?Temazepam  08/04/21, #30, 3 refills ? ?Please review, thanks! ?

## 2022-01-05 ENCOUNTER — Telehealth: Payer: Self-pay | Admitting: Neurology

## 2022-01-05 NOTE — Telephone Encounter (Signed)
PA completed on [employer]/ [employer] ?Key: [id number] ?Will await determination ?

## 2022-01-07 ENCOUNTER — Other Ambulatory Visit: Payer: Self-pay | Admitting: Family Medicine

## 2022-01-12 ENCOUNTER — Ambulatory Visit: Payer: BC Managed Care – PPO | Admitting: Neurology

## 2022-01-12 DIAGNOSIS — M5412 Radiculopathy, cervical region: Secondary | ICD-10-CM | POA: Diagnosis not present

## 2022-01-16 NOTE — Telephone Encounter (Signed)
PA approved 01/11/22-01/12/23. PA# [employer] [id number] ?

## 2022-01-18 ENCOUNTER — Encounter: Payer: Self-pay | Admitting: Obstetrics & Gynecology

## 2022-01-18 ENCOUNTER — Ambulatory Visit (INDEPENDENT_AMBULATORY_CARE_PROVIDER_SITE_OTHER): Payer: BC Managed Care – PPO | Admitting: Obstetrics & Gynecology

## 2022-01-18 ENCOUNTER — Other Ambulatory Visit (HOSPITAL_COMMUNITY)
Admission: RE | Admit: 2022-01-18 | Discharge: 2022-01-18 | Disposition: A | Discharge status: Home / Self Care | Payer: BC Managed Care – PPO | Source: Ambulatory Visit | Attending: Obstetrics & Gynecology | Admitting: Obstetrics & Gynecology

## 2022-01-18 ENCOUNTER — Other Ambulatory Visit: Payer: Self-pay

## 2022-01-18 VITALS — BP 120/80 | Ht 64.0 in | Wt 245.0 lb

## 2022-01-18 DIAGNOSIS — N871 Moderate cervical dysplasia: Secondary | ICD-10-CM | POA: Insufficient documentation

## 2022-01-18 NOTE — Progress Notes (Signed)
?HPI:  Patient is a 41 y.o. G1P1001 presenting for follow up evaluation of abnormal PAP smear in the past.  Her last PAP was   4 mos  ago and was abnormal: ASC-H . She has had a prior colposcopy (CIN II in 02/2021). Prior biopsies (if done) were  abnormal.  She had Cryo to cervix 02/2021.  She had LEEP 08/2021 after PAP found to still be abnormal : ? ?DIAGNOSIS:  ?A. ENDOCERVIX; CURETTAGE:  ?- DETACHED FRAGMENTS OF HIGH-GRADE SQUAMOUS INTRAEPITHELIAL LESION (CIN  ?2-3, MODERATE-SEVERE DYSPLASIA).  ?- POLYPOID FRAGMENTS OF BENIGN ENDOCERVICAL GLANDULAR TISSUE.  ?- NO DEFINITE EVIDENCE OF MALIGNANCY.  ? ?B. ENDOCERVIX; LOOP ELECTROSURGICAL EXCISION PROCEDURE:  ?- BENIGN ENDOCERVICAL GLANDULAR TISSUE.  ?- NEGATIVE FOR DYSPLASIA AND MALIGNANCY.  ? ?C. UTERINE CERVIX; LOOP ELECTROSURGICAL EXCISION PROCEDURE:  ?- HIGH-GRADE SQUAMOUS INTRAEPITHELIAL LESION (CIN-3, SEVERE DYSPLASIA)  ?WITH ENDOCERVICAL GLANDULAR INVOLVEMENT.  ?- ENDO- AND ECTOCERVICAL MARGINS APPEAR FREE OF DYSPLASIA.  ?- NO EVIDENCE OF MALIGNANCY. ? ?PMHx: ?She  has a past medical history of Anxiety, GERD (gastroesophageal reflux disease), Headache, Insomnia, Multiple sclerosis (HCC), Neuromuscular disorder (HCC), Nystagmus (02/15/2017), PAC (premature atrial contraction), PVC (premature ventricular contraction), Syncope and collapse, and Tachycardia. Also,  has a past surgical history that includes No past surgeries and LEEP (N/A, 09/20/2021)., family history includes ALS in her maternal grandmother; Anxiety disorder in her mother; Emphysema in her mother; Osteoarthritis in her mother; Other in her father; Suicidality in her father; Uterine cancer (age of onset: 65) in her mother.,  reports that she quit smoking about 8 years ago. Her smoking use included cigarettes. She has a 5.00 pack-year smoking history. She has never used smokeless tobacco. She reports current alcohol use. She reports that she does not use drugs. ? ?She has a current medication list  which includes the following prescription(s): aimovig, amitriptyline, atenolol, baclofen, bd pen needle nano 2nd gen, clonazepam, duloxetine, gabapentin, hailey 24 fe, saxenda, meclizine, kesimpta, pantoprazole, sucralfate, sumatriptan, temazepam, and diltiazem. Also, is allergic to codeine, onion, and topamax [topiramate]. ? ?Review of Systems  ?All other systems reviewed and are negative. ? ?Objective: ?BP 120/80   Ht 5\' 4"  (1.626 m)   Wt 245 lb (111.1 kg)   LMP 12/14/2021   BMI 42.05 kg/m?  ?Filed Weights  ? 01/18/22 1353  ?Weight: 245 lb (111.1 kg)  ? ?Body mass index is 42.05 kg/m?. ? ?Physical examination ?Physical Exam ?Constitutional:   ?   General: She is not in acute distress. ?   Appearance: She is well-developed.  ?Genitourinary:  ?   Right Labia: No rash or tenderness. ?   Left Labia: No tenderness or rash. ?   No vaginal erythema or bleeding.  ? ?   Right Adnexa: not tender and no mass present. ?   Left Adnexa: not tender and no mass present. ?   No cervical motion tenderness, discharge, polyp or nabothian cyst.  ?   Uterus is not enlarged.  ?   No uterine mass detected. ?   Pelvic exam was performed with patient in the lithotomy position.  ?HENT:  ?   Head: Normocephalic and atraumatic.  ?   Nose: Nose normal.  ?Abdominal:  ?   General: There is no distension.  ?   Palpations: Abdomen is soft.  ?   Tenderness: There is no abdominal tenderness.  ?Musculoskeletal:     ?   General: Normal range of motion.  ?Neurological:  ?   Mental Status: She is alert and  oriented to person, place, and time.  ?   Cranial Nerves: No cranial nerve deficit.  ?Skin: ?   General: Skin is warm and dry.  ?Psychiatric:     ?   Attention and Perception: Attention normal.     ?   Mood and Affect: Mood and affect normal.     ?   Speech: Speech normal.     ?   Behavior: Behavior normal.     ?   Thought Content: Thought content normal.     ?   Judgment: Judgment normal.  ? ? ?ASSESSMENT:  History of Cervical  Dysplasia ? ?Plan: ? ?1.  I discussed the grading system of pap smears and HPV high risk viral types.   ?2. Follow up PAP 6 months, vs intervention if high grade dysplasia identified. ?3. Treatment of persistantly abnormal PAP smears and cervical dysplasia, even mild, is discussed w pt today in detail, as well as the pros and cons of repeat LEEP procedures. Will consider and discuss after results. ? ?A total of 21 minutes were spent face-to-face with the patient as well as preparation, review, communication, and documentation during this encounter.  ? ? ?Annamarie Major, MD, FACOG ?[facility], [facility] ?01/18/2022  1:57 PM ? ?

## 2022-01-23 ENCOUNTER — Ambulatory Visit: Payer: BC Managed Care – PPO | Admitting: Family Medicine

## 2022-01-23 LAB — CYTOLOGY - PAP
Adequacy: ABSENT
Diagnosis: NEGATIVE

## 2022-02-03 ENCOUNTER — Other Ambulatory Visit: Payer: Self-pay | Admitting: Family Medicine

## 2022-03-01 ENCOUNTER — Telehealth: Payer: Self-pay | Admitting: Neurology

## 2022-03-01 NOTE — Telephone Encounter (Signed)
Rescheduled 5/15 appt with pt over the phone- Dr. Sater out. ?

## 2022-03-07 ENCOUNTER — Ambulatory Visit: Payer: BC Managed Care – PPO | Admitting: Neurology

## 2022-03-09 ENCOUNTER — Other Ambulatory Visit: Payer: Self-pay | Admitting: Neurology

## 2022-03-09 ENCOUNTER — Other Ambulatory Visit: Payer: Self-pay | Admitting: Family Medicine

## 2022-03-09 DIAGNOSIS — F411 Generalized anxiety disorder: Secondary | ICD-10-CM

## 2022-03-09 NOTE — Telephone Encounter (Signed)
LOV 10/03/21 ?Last refill 12/28/21, #20, 1 refills ? ?Please review, thanks! ? ?

## 2022-03-09 NOTE — Telephone Encounter (Signed)
Requested medication (s) are due for refill today: yes ? ?Requested medication (s) are on the active medication list: yes   ? ?Last refill: 12/28/21  #20  1 refill ? ?Future visit scheduled no ? ?Notes to clinic:Not delegated, please review. Thank you. ? ?Requested Prescriptions  ?Pending Prescriptions Disp Refills  ? clonazePAM (KLONOPIN) 1 MG tablet [Pharmacy Med Name: CLONAZEPAM 1 MG TABLET] 20 tablet 1  ?  Sig: TAKE 1 TABLET BY MOUTH TWICE A DAY AS NEEDED FOR ANXIETY  ?  ? Not Delegated - Psychiatry: Anxiolytics/Hypnotics 2 Failed - 03/09/2022 12:31 PM  ?  ?  Failed - This refill cannot be delegated  ?  ?  Failed - Urine Drug Screen completed in last 360 days  ?  ?  Passed - Patient is not pregnant  ?  ?  Passed - Valid encounter within last 6 months  ?  Recent Outpatient Visits   ? ?      ? 5 months ago Pelvic pain  ? [facility] Pickard, Priscille Heidelberg, MD  ? 7 months ago Viral upper respiratory tract infection  ? [facility] Pickard, Priscille Heidelberg, MD  ? 7 months ago Elevated serum creatinine  ? [facility] Pickard, Priscille Heidelberg, MD  ? 10 months ago Cervical radiculopathy  ? [facility] Pickard, Priscille Heidelberg, MD  ? 1 year ago Diarrhea, unspecified type  ? [facility] Valentino Nose, NP  ? ?  ?  ? ? ?  ?  ?  ? ? ? ? ?

## 2022-03-13 ENCOUNTER — Ambulatory Visit: Payer: BC Managed Care – PPO | Admitting: Neurology

## 2022-03-16 ENCOUNTER — Other Ambulatory Visit: Payer: Self-pay

## 2022-03-16 ENCOUNTER — Telehealth: Payer: Self-pay

## 2022-03-16 MED ORDER — HAILEY 24 FE 1-20 MG-MCG(24) PO TABS: 1.0000 | ORAL_TABLET | Freq: Every day | ORAL | 0 refills | Status: DC

## 2022-03-16 NOTE — Telephone Encounter (Signed)
Triage voicemail: Requesting refill of Birth Control. UY#[phone].

## 2022-03-22 ENCOUNTER — Encounter: Payer: Self-pay | Admitting: Neurology

## 2022-03-22 ENCOUNTER — Ambulatory Visit
Admission: RE | Admit: 2022-03-22 | Discharge: 2022-03-22 | Disposition: A | Discharge status: Home / Self Care | Payer: BC Managed Care – PPO | Source: Ambulatory Visit | Attending: Neurology | Admitting: Neurology

## 2022-03-22 ENCOUNTER — Ambulatory Visit (INDEPENDENT_AMBULATORY_CARE_PROVIDER_SITE_OTHER): Payer: BC Managed Care – PPO | Admitting: Neurology

## 2022-03-22 ENCOUNTER — Ambulatory Visit: Payer: BC Managed Care – PPO | Admitting: Neurology

## 2022-03-22 VITALS — BP 125/82 | HR 80 | Ht 64.0 in | Wt 236.0 lb

## 2022-03-22 DIAGNOSIS — G8929 Other chronic pain: Secondary | ICD-10-CM | POA: Diagnosis not present

## 2022-03-22 DIAGNOSIS — Z79899 Other long term (current) drug therapy: Secondary | ICD-10-CM

## 2022-03-22 DIAGNOSIS — M25562 Pain in left knee: Secondary | ICD-10-CM

## 2022-03-22 DIAGNOSIS — R208 Other disturbances of skin sensation: Secondary | ICD-10-CM

## 2022-03-22 DIAGNOSIS — G43709 Chronic migraine without aura, not intractable, without status migrainosus: Secondary | ICD-10-CM

## 2022-03-22 DIAGNOSIS — G4700 Insomnia, unspecified: Secondary | ICD-10-CM

## 2022-03-22 DIAGNOSIS — M25561 Pain in right knee: Secondary | ICD-10-CM | POA: Diagnosis not present

## 2022-03-22 DIAGNOSIS — G35 Multiple sclerosis: Secondary | ICD-10-CM

## 2022-03-22 MED ORDER — ETODOLAC 400 MG PO TABS: 400.0000 mg | ORAL_TABLET | Freq: Two times a day (BID) | ORAL | 5 refills | Status: DC

## 2022-03-22 NOTE — Telephone Encounter (Signed)
Called pt. Offered work in appt today at [employer] with Dr. Epimenio Foot. She accepted. I scheduled and she will check in at 1245pm. She will make sure to bring updated med list and insurance cards with her.

## 2022-03-22 NOTE — Progress Notes (Addendum)
[facility]  PATIENT: Virginia [name] DOB: 12-09-1980  REFERRING DOCTOR OR PCP:    Blair Heys SOURCE: patient, admit/discharge hospital notes, imaging reports, MRI's on PACS  _________________________________   HISTORICAL  CHIEF COMPLAINT:  Chief Complaint  Patient presents with   Follow-up    Rm 1, alone. Here for 6 month MS f/u, on Kesimpta. Having leg and arm cramps. R shoulder pn.     HISTORY OF PRESENT ILLNESS:  Virginia [name] is a 41 y.o. woman with relapsing remitting multiple sclerosis who also has back pain.  Update  03/22/2022: She converted to Kesimpta from Tysabri and is tolerating it well.   She denies any exacerbation.  MRI of the brain 01/20/2020 showed scattered T2/FLAIR hyperintense foci in the hemispheres and medulla in a pattern and configuration consistent with chronic demyelinating plaque associated with multiple sclerosis.   Compared to the MRI from 01/17/2019, there are no new lesions.    Gait is about the same with mildly reduced balance.   She stumbles but no recent falls.  She can walk 1/2 mile without a break.  She has burning in her legs, right > left helped some by gabapentin 600 mg po qid and amitriptyline 50 mg nightly.    She has occasional vertigo spells, triggered by changes in position.   She denies hearing loss.    Bladder function is fine.    She is noting more fatigue.   She notes sleep is poor at night even with amitriptlne and gabapentin.      Mood is fine.     She notes more pain in her knees bilaterally.    Pain is worse when she gets up from a seated position.   She also has leg cramps that improve with movements and increase with prolonged rest.   Aleve has not helped much.   Baclofen helps her spasms some.   She has low back pain, worse when she stands.  Sometimes she gets pain in the feet as well.  She has a disc herniation at L5-S1.  She also has neck pain but not arm pain.    Her chronic migraine is doing worse.  The  Aimovig was discontinued as her co-pay was going to be very high.  She is still on amitriptyline.  When she was on both Aimovig and amitriptyline the migraines were doing very well.  She has gained some weight (40 pounds in last 4 years)  MS History:   In mid May 2017, she was starting to experience occasional lightheadedness and a spinning vertigo. On 03/19/2016, she had more extreme vertigo and had an episode of syncope. She notices that when she was walking she would veer towards the right. Because of the syncope, she was taken to the emergency room. He had an MRI of the brain that showed several enhancing lesions, potentially worrisome for multiple sclerosis. Additional studies were performed. She received 3 days of IV steroids. The MRI's of the spine did not show any additional plaques. The lumbar puncture showed oligoclonal bands with normal IgG index, consistent with multiple sclerosis.   She started  Aubagio.   MRI in 2018 showed additional foci plus she had tolerability issues.   She has been on Tysabri since 12/26/2016.  She switched to [facility] 07/2021    LABS/LP She underwent a lumbar puncture on 03/21/2016. 4 oligoclonal bands present in the CSF which were not present in the serum, consistent with multiple sclerosis. The IgG index was high normal at  0.6.   There were only 4 white blood cells but 280 red blood cells more consistent with a slightly traumatic tap.    The [facility] Spotted Fever CSF IgG was negative but the IgM was positive at 1.41 (less than 0.9 normal).    ANA and ANCA were both negative.  IMAGING MRI images from 03/20/2016, 03/22/2016 and CT angiogram images from 03/23/2016 reviewed: The MRI of the brain shows several 2/fair hyperintense foci, some in the periventricular and juxtacortical white matter. Most of the foci enhanced after contrast administration. MRI of the cervical and thoracic spine did not show any spinal cord plaques and there was no significant  degenerative change.   CT angiogram was essentially normal showing no significant stenosis.  MRI 11/28/2016 showed multiple periventricular, subcortical, pericallosal as well as craniovertebral junction white matter hyperintensities compatible with multiple sclerosis. Multiple enhancing lesions are noted in the periventricular regions with the largest one in the left posterior frontal subcortical white matter. Incidental large venous angioma is noted in the right parietal region. Compared with MRI scan dated 03/20/2016 there are several new enhancing as well as nonenhancing white matter lesions.  MRI 12/25/2016 of the cervical spine showed two T2 hyperintense foci within the left posterolateral spinal cord, one adjacent to C6-C7 and one adjacent to T1. There may have been subtle signal within the spinal cord adjacent to C6-C7 on the previous scan but the current focus is clearly larger. The focus adjacent to T1 was not evident on the previous MRI. Neither of these 2 foci enhanced with contrast.         There is mild spinal stenosis at C4-C5, C5-C6 and C6-C7, unchanged when compared to the previous MRI. There is mild right foraminal narrowing at C6-C7 but there is no nerve root compression.  MRI 01/10/2018 of the brain showed multiple T2/FLAIR hyperintense foci in the brainstem and in the periventricular, juxtacortical and deep white matter in a pattern and configuration consistent with chronic demyelinating plaque associated with multiple sclerosis.  Compared to the MRI dated 11/28/2016, there is an improved appearance in the lesions that were enhancing at that time no longer do so.  There are no new lesions.  MRI [facility] 01/27/2018 showed, at L4-L5, there is stable mild left facet hypertrophy and mild ligamentum flavum hypertrophy.  There is no nerve root compression.     At L5-S1, there is a stable small central disc herniation causing bilateral lateral recess narrowing.  This contacts both of the S1 nerve roots.   There is no definite change when compared to the previous MRI.  MRI brain 01/17/2019 showed multiple T2/flair hyperintense foci in the hemispheres in a pattern and configuration consistent with chronic demyelinating plaque associated with multiple sclerosis.  Two foci that have been seen previously in the brainstem are not apparent on the current MRI.  There are no new lesions compared to the 2019 MRI.  None of the foci enhance.    There is a small developmental venous anomaly in the right parietal lobe that is unchanged in appearance.  Otherwise, the enhancement pattern is normal.  MRI of the brain 01/20/2020 showed scattered T2/FLAIR hyperintense foci in the hemispheres and medulla in a pattern and configuration consistent with chronic demyelinating plaque associated with multiple sclerosis.   Compared to the MRI from 01/17/2019, there are no new lesions.     REVIEW OF SYSTEMS: Constitutional: No fevers, chills, sweats, or change in appetite.   She has fatigue. Insomnia much better with temazepam  Eyes: No visual changes, double vision, eye pain Ear, nose and throat: No hearing loss, ear pain, nasal congestion, sore throat Cardiovascular: No chest pain, palpitations Respiratory:  No shortness of breath at rest or with exertion.   No wheezes GastrointestinaI: No nausea, vomiting, diarrhea, abdominal pain, fecal incontinence Genitourinary:  No dysuria, urinary retention.   She reports frequency.  No nocturia. Musculoskeletal:  No neck pain, back pain Integumentary: No rash, pruritus, skin lesions Neurological: as above Psychiatric: No depression at this time.  No anxiety Endocrine: No palpitations, diaphoresis, change in appetite, change in weigh or increased thirst Hematologic/Lymphatic:  No anemia, purpura, petechiae. Allergic/Immunologic: No itchy/runny eyes, nasal congestion, recent allergic reactions, rashes  ALLERGIES: Allergies  Allergen Reactions   Codeine Other (See Comments)     Hallucinations    Onion     swelling   Topamax [Topiramate]     Was talking out of her head, walked into walls    HOME MEDICATIONS:  Current Outpatient Medications:    AIMOVIG 140 MG/ML SOAJ, INJECT 140 MG INTO THE SKIN EVERY 30 (THIRTY) DAYS, Disp: 1 mL, Rfl: 5   amitriptyline (ELAVIL) 50 MG tablet, TAKE 1 TABLET BY MOUTH EVERY DAY AT BEDTIME AS NEEDED FOR SLEEP (Patient taking differently: Take 50 mg by mouth at bedtime.), Disp: 30 tablet, Rfl: 8   atenolol (TENORMIN) 50 MG tablet, TAKE 1 TABLET BY MOUTH EVERY DAY (Patient taking differently: Take 50 mg by mouth at bedtime.), Disp: 30 tablet, Rfl: 11   baclofen (LIORESAL) 10 MG tablet, TAKE 2TABLETS BY MOUTH 3 TIMES A DAY FOR SPASTICITY, Disp: 540 tablet, Rfl: 3   BD PEN NEEDLE NANO 2ND GEN 32G X 4 MM MISC, USE AS DIRECTED TO INJECT SAXENDA SUBCUTANEOUSLY EVERY DAY, Disp: 100 each, Rfl: 1   clonazePAM (KLONOPIN) 1 MG tablet, TAKE 1 TABLET BY MOUTH TWICE A DAY AS NEEDED FOR ANXIETY, Disp: 20 tablet, Rfl: 1   diltiazem 2 % GEL, Apply 1 application topically 3 (three) times daily., Disp: 30 g, Rfl: 1   DULoxetine (CYMBALTA) 60 MG capsule, TAKE 1 CAPSULE BY MOUTH EVERY DAY, Disp: 90 capsule, Rfl: 2   etodolac (LODINE) 400 MG tablet, Take 1 tablet (400 mg total) by mouth 2 (two) times daily., Disp: 60 tablet, Rfl: 5   gabapentin (NEURONTIN) 600 MG tablet, TAKE 1 TABLET (600 MG TOTAL) BY MOUTH 4 (FOUR) TIMES DAILY., Disp: 120 tablet, Rfl: 11   Liraglutide -Weight Management (SAXENDA) 18 MG/3ML SOPN, Inject 0.6 mg into the skin daily. Increase dose by 06mg  SQ QW until max dose of 3mg  SQ QD is reached., Disp: 6 mL, Rfl: 5   meclizine (ANTIVERT) 25 MG tablet, Take 1 tablet (25 mg total) by mouth 3 (three) times daily as needed for dizziness., Disp: 30 tablet, Rfl: 11   Norethindrone Acetate-Ethinyl Estrad-FE (HAILEY 24 FE) 1-20 MG-MCG(24) tablet, Take 1 tablet by mouth daily., Disp: 84 tablet, Rfl: 0   Ofatumumab (KESIMPTA) 20 MG/0.4ML SOAJ, Inject  20 mg into the skin every 30 (thirty) days. 20mg  at week 0, 1, 2. 20mg  once monthly starting at week 4., Disp: , Rfl:    pantoprazole (PROTONIX) 40 MG tablet, TAKE 1 TABLET BY MOUTH TWICE A DAY, Disp: 180 tablet, Rfl: 1   sucralfate (CARAFATE) 1 g tablet, TAKE 1 TABLET BY MOUTH 4 TIMES DAILY - WITH MEALS AND AT BEDTIME. (Patient taking differently: as needed.), Disp: 120 tablet, Rfl: 1   SUMAtriptan (IMITREX) 50 MG tablet, TAKE 1 TABLET EVERY 2 HRS  AS NEEDED FOR MIGRAINE. MAY REPEAT IN 2 HRS IF HEADACHE PERSISTS OR RECURS, Disp: 10 tablet, Rfl: 5   temazepam (RESTORIL) 30 MG capsule, TAKE 1 CAPSULE (30 MG TOTAL) BY MOUTH AT BEDTIME AS NEEDED FOR SLEEP (DO NOT TAKE WITH KLONOPIN)., Disp: 30 capsule, Rfl: 3  PAST MEDICAL HISTORY: Past Medical History:  Diagnosis Date   Anxiety    GERD (gastroesophageal reflux disease)    Headache    Insomnia    Multiple sclerosis (HCC)    dx 2017   Neuromuscular disorder (HCC)    Phreesia 06/30/2020   Nystagmus 02/15/2017   PAC (premature atrial contraction)    PVC (premature ventricular contraction)    Syncope and collapse    Tachycardia     PAST SURGICAL HISTORY: Past Surgical History:  Procedure Laterality Date   LEEP N/A 09/20/2021   Procedure: LOOP ELECTROSURGICAL EXCISION PROCEDURE (LEEP);  Surgeon: [name], MD;  Location: [facility] ORS;  Service: Gynecology;  Laterality: N/A;   NO PAST SURGERIES      FAMILY HISTORY: Family History  Problem Relation Age of Onset   Emphysema Mother    Osteoarthritis Mother    Anxiety disorder Mother    Uterine cancer Mother 63   Other Father    Suicidality Father    ALS Maternal Grandmother    Colon cancer Neg Hx    Colon polyps Neg Hx    Esophageal cancer Neg Hx    Rectal cancer Neg Hx    Stomach cancer Neg Hx     SOCIAL HISTORY:  Social History   Socioeconomic History   Marital status: Married    Spouse name: Not on file   Number of children: Not on file   Years of education: Not on  file   Highest education level: Not on file  Occupational History   Not on file  Tobacco Use   Smoking status: Former    Packs/day: 0.50    Years: 10.00    Pack years: 5.00    Types: Cigarettes    Quit date: 2015    Years since quitting: 8.3   Smokeless tobacco: Never  Vaping Use   Vaping Use: Every day   Substances: Nicotine, Flavoring  Substance and Sexual Activity   Alcohol use: Yes    Alcohol/week: 0.0 standard drinks    Comment: rarely   Drug use: No   Sexual activity: Yes  Other Topics Concern   Not on file  Social History Narrative   Not on file   Social Determinants of Health   Financial Resource Strain: Not on file  Food Insecurity: Not on file  Transportation Needs: Not on file  Physical Activity: Not on file  Stress: Not on file  Social Connections: Not on file  Intimate Partner Violence: Not on file     PHYSICAL EXAM  Vitals:   03/22/22 1306  BP: 125/82  Pulse: 80  Weight: 236 lb (107 kg)  Height: 5\' 4"  (1.626 m)    Body mass index is 40.51 kg/m.   General: The patient is well-developed and well-nourished and in no acute distress,.  She has mild pedal edema.  The knees are not erythematous or hot with normal range of motion  Neurologic Exam  Mental status: The patient is alert and oriented x 3 at the time of the examination. The patient has apparent normal recent and remote memory, with an apparently normal attention span and concentration ability.   Speech is normal.  Cranial nerves: Extraocular  muscles are full.  Now no end gaze nystagmus to the left..  Facial strength and sensation is normal. Trapezius strength is normal. No obvious hearing deficits are noted.  Motor:  Muscle bulk is normal.  Strength is 5/5. Tone is mildly increased in the legs.  Sensory: She had intact sensation to touch and vibration in arms but reduced sensation to touch/temp and vibration in right leg.    Coordination: Cerebellar testing reveals good  finger-nose-finger and heel-to-shin bilaterally.  Gait and station: Station is normal.   The gait is arthritic and minimally wide.  Tandem gait is mildly wide  Romberg is negative.  Reflexes: Deep tendon reflexes are symmetric and normal in arms.  DTRs are brisk at knees with some spread but no ankle clonus.    DIAGNOSTIC DATA (LABS, IMAGING, TESTING) - I reviewed patient records, labs, notes, testing and imaging myself where available.  Lab Results  Component Value Date   WBC 6.9 10/03/2021   HGB 13.9 10/03/2021   HCT 40.3 10/03/2021   MCV 94.6 10/03/2021   PLT 306 10/03/2021      Component Value Date/Time   NA 141 10/03/2021 1645   NA 141 09/01/2021 0957   K 3.7 10/03/2021 1645   CL 108 10/03/2021 1645   CO2 23 10/03/2021 1645   GLUCOSE 120 (H) 10/03/2021 1645   BUN 12 10/03/2021 1645   BUN 14 09/01/2021 0957   CREATININE 0.91 10/03/2021 1645   CALCIUM 8.6 10/03/2021 1645   PROT 6.2 10/03/2021 1645   PROT 6.0 09/01/2021 0957   ALBUMIN 3.6 09/27/2021 1656   ALBUMIN 3.7 (L) 09/01/2021 0957   AST 13 10/03/2021 1645   ALT 14 10/03/2021 1645   ALKPHOS 83 09/27/2021 1656   BILITOT 0.3 10/03/2021 1645   BILITOT <0.2 09/01/2021 0957   GFRNONAA >60 09/27/2021 1656   GFRNONAA 80 08/24/2020 1643   GFRAA 93 08/24/2020 1643    Lab Results  Component Value Date   TSH 1.010 01/01/2018       ASSESSMENT AND PLAN    1. Relapsing remitting multiple sclerosis (HCC)   2. High risk medication use   3. Dysesthesia   4. Chronic migraine w/o aura w/o status migrainosus, not intractable   5. Insomnia, unspecified type   6. Chronic pain of both knees       1.   Continue Kesimpta.  Check labs 2.   Continue baclofen up to 60 mg in split dose a day   3.   Continue gabapentin 600 mg 3-4 times a day.  If dysesthesias worsen, consider lamotrigine.    4.   For headaches, continue Aimovig if copay is affordable (I gave  card)- otherwise switch to [place].   Take amitriptyline 50  mg nightly.    5.   Due to multiple joint pain, will check RF and ANA and uric acid.   Also start etodolac.   Check knee xrays. 6.   Try to exercise and lose weight. Return 5-6 months.  Call sooner if there are new or worsening neurologic symptoms.  40-minute office visit with the majority of the time spent face-to-face for history and physical, discussion/counseling and decision-making.  Additional time with record review and documentation.   Reon Hunley A. Epimenio Foot, MD, PhD 03/22/2022, 1:42 PM Certified in Neurology, Clinical Neurophysiology, Sleep Medicine, Pain Medicine and Neuroimaging  [facility] [address] [place], [place] [address] [phone]

## 2022-03-24 LAB — CBC WITH DIFFERENTIAL/PLATELET
Basophils Absolute: 0.1 10*3/uL (ref 0.0–0.2)
Basos: 1 %
EOS (ABSOLUTE): 0.7 10*3/uL — ABNORMAL HIGH (ref 0.0–0.4)
Eos: 7 %
Hematocrit: 41.8 % (ref 34.0–46.6)
Hemoglobin: 14.1 g/dL (ref 11.1–15.9)
Immature Grans (Abs): 0 10*3/uL (ref 0.0–0.1)
Immature Granulocytes: 0 %
Lymphocytes Absolute: 1.6 10*3/uL (ref 0.7–3.1)
Lymphs: 18 %
MCH: 31.8 pg (ref 26.6–33.0)
MCHC: 33.7 g/dL (ref 31.5–35.7)
MCV: 94 fL (ref 79–97)
Monocytes Absolute: 0.6 10*3/uL (ref 0.1–0.9)
Monocytes: 6 %
Neutrophils Absolute: 6.2 10*3/uL (ref 1.4–7.0)
Neutrophils: 68 %
Platelets: 348 10*3/uL (ref 150–450)
RBC: 4.43 x10E6/uL (ref 3.77–5.28)
RDW: 12.3 % (ref 11.7–15.4)
WBC: 9.2 10*3/uL (ref 3.4–10.8)

## 2022-03-24 LAB — IGG, IGA, IGM
IgA/Immunoglobulin A, Serum: 131 mg/dL (ref 87–352)
IgG (Immunoglobin G), Serum: 941 mg/dL (ref 586–1602)
IgM (Immunoglobulin M), Srm: 26 mg/dL (ref 26–217)

## 2022-03-24 LAB — HEPATIC FUNCTION PANEL
ALT: 16 IU/L (ref 0–32)
AST: 10 IU/L (ref 0–40)
Albumin: 3.8 g/dL (ref 3.8–4.8)
Alkaline Phosphatase: 96 IU/L (ref 44–121)
Bilirubin Total: <0.2 mg/dL (ref 0.0–1.2)
Bilirubin, Direct: <0.1 mg/dL (ref 0.00–0.40)
Total Protein: 6.6 g/dL (ref 6.0–8.5)

## 2022-03-24 LAB — ANA W/REFLEX: Anti Nuclear Antibody (ANA): NEGATIVE

## 2022-03-24 LAB — URIC ACID: Uric Acid: 5.6 mg/dL (ref 2.6–6.2)

## 2022-03-24 LAB — RHEUMATOID FACTOR: Rheumatoid fact SerPl-aCnc: <10 IU/mL (ref <14.0)

## 2022-03-24 NOTE — Telephone Encounter (Signed)
Refill request processed on 03/16/22.

## 2022-03-27 ENCOUNTER — Telehealth: Payer: Self-pay | Admitting: Neurology

## 2022-03-27 NOTE — Telephone Encounter (Signed)
[employer] Berkley Harvey: [phone] exp. 03/27/22-04/25/22 sent to GI they will call the patient to schedule

## 2022-03-30 ENCOUNTER — Telehealth: Payer: Self-pay | Admitting: *Deleted

## 2022-03-30 NOTE — Telephone Encounter (Signed)
Faxed completed/signed PA Kesimpta back to [employer] at [phone]. Received fax confirmation, waiting on determination.

## 2022-04-04 NOTE — Telephone Encounter (Signed)
Received fax from [employer] that PA approved 03/30/22-03/31/23. PA# [employer] [phone]

## 2022-04-09 ENCOUNTER — Ambulatory Visit
Admission: RE | Admit: 2022-04-09 | Discharge: 2022-04-09 | Disposition: A | Discharge status: Home / Self Care | Payer: BC Managed Care – PPO | Source: Ambulatory Visit | Attending: Neurology | Admitting: Neurology

## 2022-04-09 DIAGNOSIS — R208 Other disturbances of skin sensation: Secondary | ICD-10-CM

## 2022-04-09 DIAGNOSIS — G35 Multiple sclerosis: Secondary | ICD-10-CM

## 2022-05-10 ENCOUNTER — Encounter: Payer: Self-pay | Admitting: Family Medicine

## 2022-05-25 ENCOUNTER — Ambulatory Visit (INDEPENDENT_AMBULATORY_CARE_PROVIDER_SITE_OTHER): Payer: BC Managed Care – PPO | Admitting: Family Medicine

## 2022-05-25 ENCOUNTER — Other Ambulatory Visit: Payer: Self-pay | Admitting: Obstetrics and Gynecology

## 2022-05-25 VITALS — BP 115/78 | HR 84 | Temp 98.0°F | Ht 64.0 in | Wt 231.0 lb

## 2022-05-25 DIAGNOSIS — N6314 Unspecified lump in the right breast, lower inner quadrant: Secondary | ICD-10-CM | POA: Diagnosis not present

## 2022-05-25 NOTE — Progress Notes (Signed)
Subjective:    Patient ID: Virginia [name], female    DOB: February 06, 1981, 41 y.o.   MRN: [id number]  HPI   Patient states that about 2 months ago she noticed a lump in her right breast.  She states that is located roughly around 4:00 or 5:00 from the nipple.  She denies any skin changes.  She denies any erythema or warmth.  She denies any nipple discharge.  However she has a very strong family history of breast cancer with multiple aunts on her mother side being diagnosed with breast cancer in her 22s.  Breast exam was performed today with a chaperone present.  I am unable to locate a firm mass in the right breast in the area of concern.  Patient does have fibrocystic changes in the breast but I do not appreciate a mass. Past Medical History:  Diagnosis Date   Anxiety    GERD (gastroesophageal reflux disease)    Headache    Insomnia    Multiple sclerosis (HCC)    dx 2017   Neuromuscular disorder (HCC)    Phreesia 06/30/2020   Nystagmus 02/15/2017   PAC (premature atrial contraction)    PVC (premature ventricular contraction)    Syncope and collapse    Tachycardia    Past Surgical History:  Procedure Laterality Date   LEEP N/A 09/20/2021   Procedure: LOOP ELECTROSURGICAL EXCISION PROCEDURE (LEEP);  Surgeon: [name], MD;  Location: [facility] ORS;  Service: Gynecology;  Laterality: N/A;   NO PAST SURGERIES     Current Outpatient Medications on File Prior to Visit  Medication Sig Dispense Refill   AIMOVIG 140 MG/ML SOAJ INJECT 140 MG INTO THE SKIN EVERY 30 (THIRTY) DAYS 1 mL 5   amitriptyline (ELAVIL) 50 MG tablet TAKE 1 TABLET BY MOUTH EVERY DAY AT BEDTIME AS NEEDED FOR SLEEP (Patient taking differently: Take 50 mg by mouth at bedtime.) 30 tablet 8   atenolol (TENORMIN) 50 MG tablet TAKE 1 TABLET BY MOUTH EVERY DAY (Patient taking differently: Take 50 mg by mouth at bedtime.) 30 tablet 11   baclofen (LIORESAL) 10 MG tablet TAKE 2TABLETS BY MOUTH 3 TIMES A DAY FOR SPASTICITY 540 tablet  3   BD PEN NEEDLE NANO 2ND GEN 32G X 4 MM MISC USE AS DIRECTED TO INJECT SAXENDA SUBCUTANEOUSLY EVERY DAY 100 each 1   clonazePAM (KLONOPIN) 1 MG tablet TAKE 1 TABLET BY MOUTH TWICE A DAY AS NEEDED FOR ANXIETY 20 tablet 1   diltiazem 2 % GEL Apply 1 application topically 3 (three) times daily. 30 g 1   DULoxetine (CYMBALTA) 60 MG capsule TAKE 1 CAPSULE BY MOUTH EVERY DAY 90 capsule 2   etodolac (LODINE) 400 MG tablet Take 1 tablet (400 mg total) by mouth 2 (two) times daily. 60 tablet 5   gabapentin (NEURONTIN) 600 MG tablet TAKE 1 TABLET (600 MG TOTAL) BY MOUTH 4 (FOUR) TIMES DAILY. 120 tablet 11   Liraglutide -Weight Management (SAXENDA) 18 MG/3ML SOPN Inject 0.6 mg into the skin daily. Increase dose by 06mg  SQ QW until max dose of 3mg  SQ QD is reached. 6 mL 5   meclizine (ANTIVERT) 25 MG tablet Take 1 tablet (25 mg total) by mouth 3 (three) times daily as needed for dizziness. 30 tablet 11   Norethindrone Acetate-Ethinyl Estrad-FE (HAILEY 24 FE) 1-20 MG-MCG(24) tablet Take 1 tablet by mouth daily. 84 tablet 0   Ofatumumab (KESIMPTA) 20 MG/0.4ML SOAJ Inject 20 mg into the skin every 30 (thirty) days. 20mg   at week 0, 1, 2. 20mg  once monthly starting at week 4.     pantoprazole (PROTONIX) 40 MG tablet TAKE 1 TABLET BY MOUTH TWICE A DAY 180 tablet 1   sucralfate (CARAFATE) 1 g tablet TAKE 1 TABLET BY MOUTH 4 TIMES DAILY - WITH MEALS AND AT BEDTIME. (Patient taking differently: as needed.) 120 tablet 1   SUMAtriptan (IMITREX) 50 MG tablet TAKE 1 TABLET EVERY 2 HRS AS NEEDED FOR MIGRAINE. MAY REPEAT IN 2 HRS IF HEADACHE PERSISTS OR RECURS 10 tablet 5   temazepam (RESTORIL) 30 MG capsule TAKE 1 CAPSULE (30 MG TOTAL) BY MOUTH AT BEDTIME AS NEEDED FOR SLEEP (DO NOT TAKE WITH KLONOPIN). 30 capsule 3   No current facility-administered medications on file prior to visit.   Allergies  Allergen Reactions   Codeine Other (See Comments)    Hallucinations    Onion     swelling   Topamax [Topiramate]      Was talking out of her head, walked into walls   Social History   Socioeconomic History   Marital status: Married    Spouse name: Not on file   Number of children: Not on file   Years of education: Not on file   Highest education level: Not on file  Occupational History   Not on file  Tobacco Use   Smoking status: Former    Packs/day: 0.50    Years: 10.00    Total pack years: 5.00    Types: Cigarettes    Quit date: 2015    Years since quitting: 8.5   Smokeless tobacco: Never  Vaping Use   Vaping Use: Every day   Substances: Nicotine, Flavoring  Substance and Sexual Activity   Alcohol use: Yes    Alcohol/week: 0.0 standard drinks of alcohol    Comment: rarely   Drug use: No   Sexual activity: Yes  Other Topics Concern   Not on file  Social History Narrative   Not on file   Social Determinants of Health   Financial Resource Strain: Not on file  Food Insecurity: Not on file  Transportation Needs: Not on file  Physical Activity: Not on file  Stress: Not on file  Social Connections: Not on file  Intimate Partner Violence: Not on file      Review of Systems     Objective:   Physical Exam Constitutional:      Appearance: Normal appearance. She is obese.  Cardiovascular:     Rate and Rhythm: Normal rate and regular rhythm.     Heart sounds: Normal heart sounds. No murmur heard. Pulmonary:     Effort: Pulmonary effort is normal. No respiratory distress.     Breath sounds: Normal breath sounds. No stridor. No wheezing, rhonchi or rales.  Chest:    Neurological:     General: No focal deficit present.     Mental Status: She is alert and oriented to person, place, and time. Mental status is at baseline.     Cranial Nerves: No cranial nerve deficit.         Assessment & Plan:  Breast lump on right side at 4 o'clock position - Plan: MM Digital Diagnostic Bilat On exam today, I do not appreciate a mass.  However I will schedule the patient for a diagnostic  mammogram especially given her family history.  If they want to do an ultrasound I will be glad to approve that as well.  However I reassured the patient today that I  do not appreciate a mass.

## 2022-05-26 ENCOUNTER — Other Ambulatory Visit: Payer: Self-pay | Admitting: Family Medicine

## 2022-05-26 DIAGNOSIS — N6314 Unspecified lump in the right breast, lower inner quadrant: Secondary | ICD-10-CM

## 2022-05-27 ENCOUNTER — Other Ambulatory Visit: Payer: Self-pay | Admitting: Family Medicine

## 2022-05-27 DIAGNOSIS — F411 Generalized anxiety disorder: Secondary | ICD-10-CM

## 2022-05-29 NOTE — Telephone Encounter (Signed)
Requested medication (s) are due for refill today: yes  Requested medication (s) are on the active medication list: yes  Last refill:  03/09/22  Future visit scheduled: no  Notes to clinic:  Unable to refill per protocol, cannot delegate clonazepam. Routing for approval.      Requested Prescriptions  Pending Prescriptions Disp Refills   clonazePAM (KLONOPIN) 1 MG tablet [Pharmacy Med Name: CLONAZEPAM 1 MG TABLET] 20 tablet 1    Sig: TAKE 1 TABLET BY MOUTH TWICE A DAY AS NEEDED FOR ANXIETY     Not Delegated - Psychiatry: Anxiolytics/Hypnotics 2 Failed - 05/27/2022  2:42 PM      Failed - This refill cannot be delegated      Failed - Urine Drug Screen completed in last 360 days      Failed - Valid encounter within last 6 months    Recent Outpatient Visits           7 months ago Pelvic pain   [facility] [name], MD   10 months ago Viral upper respiratory tract infection   [facility] [name], MD   10 months ago Elevated serum creatinine   [facility] Pickard, Priscille Heidelberg, MD   1 year ago Cervical radiculopathy   [facility] Tanya Nones Priscille Heidelberg, MD   1 year ago Diarrhea, unspecified type   [facility] Valentino Nose, NP              Passed - Patient is not pregnant

## 2022-06-01 ENCOUNTER — Telehealth: Payer: Self-pay | Admitting: Neurology

## 2022-06-01 NOTE — Telephone Encounter (Signed)
[facility] ([place]) verify Ofatumumab (KESIMPTA) 20 MG/0.4ML SOAJ and if there has been a change in dosage  and frequently. Would like a call from the nurse.

## 2022-06-01 NOTE — Telephone Encounter (Signed)
Called the pharmacy back and spoke with Maryjean Ka. She states the PA is 1 per 28 days. She states the script sent was written for a 3 month supply and they needed another script for 1 per 28 days. I advised since the script is already written for 3 month can't they just divide them into 1 month increments after being placed on a hold for 15 min she came back and states they have it resolved and the patient will get a shipment on 06/07/22.

## 2022-06-05 ENCOUNTER — Ambulatory Visit: Payer: BC Managed Care – PPO

## 2022-06-05 ENCOUNTER — Ambulatory Visit
Admission: RE | Admit: 2022-06-05 | Discharge: 2022-06-05 | Disposition: A | Discharge status: Home / Self Care | Payer: BC Managed Care – PPO | Source: Ambulatory Visit | Attending: Family Medicine | Admitting: Family Medicine

## 2022-06-05 DIAGNOSIS — R922 Inconclusive mammogram: Secondary | ICD-10-CM | POA: Diagnosis not present

## 2022-06-05 DIAGNOSIS — N6314 Unspecified lump in the right breast, lower inner quadrant: Secondary | ICD-10-CM

## 2022-06-06 ENCOUNTER — Other Ambulatory Visit: Payer: Self-pay | Admitting: *Deleted

## 2022-06-06 DIAGNOSIS — G35 Multiple sclerosis: Secondary | ICD-10-CM

## 2022-06-06 MED ORDER — KESIMPTA 20 MG/0.4ML ~~LOC~~ SOAJ: 0.4000 mL | SUBCUTANEOUS | 3 refills | Status: DC

## 2022-06-10 ENCOUNTER — Other Ambulatory Visit: Payer: Self-pay | Admitting: Obstetrics and Gynecology

## 2022-06-19 ENCOUNTER — Other Ambulatory Visit: Payer: Self-pay | Admitting: Family Medicine

## 2022-06-26 ENCOUNTER — Encounter: Payer: Self-pay | Admitting: Neurology

## 2022-06-27 ENCOUNTER — Ambulatory Visit: Payer: BC Managed Care – PPO | Admitting: Neurology

## 2022-06-27 ENCOUNTER — Telehealth: Payer: Self-pay | Admitting: *Deleted

## 2022-06-27 ENCOUNTER — Encounter: Payer: Self-pay | Admitting: Neurology

## 2022-06-27 ENCOUNTER — Ambulatory Visit (INDEPENDENT_AMBULATORY_CARE_PROVIDER_SITE_OTHER): Payer: BC Managed Care – PPO | Admitting: Neurology

## 2022-06-27 VITALS — BP 132/81 | HR 79 | Ht 64.0 in | Wt 219.0 lb

## 2022-06-27 DIAGNOSIS — G35 Multiple sclerosis: Secondary | ICD-10-CM | POA: Diagnosis not present

## 2022-06-27 DIAGNOSIS — G43709 Chronic migraine without aura, not intractable, without status migrainosus: Secondary | ICD-10-CM

## 2022-06-27 DIAGNOSIS — Z79899 Other long term (current) drug therapy: Secondary | ICD-10-CM | POA: Diagnosis not present

## 2022-06-27 DIAGNOSIS — R208 Other disturbances of skin sensation: Secondary | ICD-10-CM

## 2022-06-27 DIAGNOSIS — G4700 Insomnia, unspecified: Secondary | ICD-10-CM | POA: Diagnosis not present

## 2022-06-27 MED ORDER — GABAPENTIN 600 MG PO TABS: 600.0000 mg | ORAL_TABLET | Freq: Four times a day (QID) | ORAL | 11 refills | Status: DC

## 2022-06-27 MED ORDER — BACLOFEN 10 MG PO TABS
ORAL_TABLET | ORAL | 3 refills | Status: DC
Start: 2022-06-27 — End: 2023-07-03

## 2022-06-27 MED ORDER — ETODOLAC 400 MG PO TABS: 400.0000 mg | ORAL_TABLET | Freq: Two times a day (BID) | ORAL | 5 refills | Status: DC

## 2022-06-27 NOTE — Telephone Encounter (Signed)
Placed JCV lab in [facility] lock box for routine lab pick up. Results pending. 

## 2022-06-27 NOTE — Progress Notes (Signed)
[facility]  PATIENT: Virginia [name] DOB: 1980-12-19  REFERRING DOCTOR OR PCP:    Blair Heys SOURCE: patient, admit/discharge hospital notes, imaging reports, MRI's on PACS  _________________________________   HISTORICAL  CHIEF COMPLAINT:  Chief Complaint  Patient presents with   Follow-up    RM . Last seen 03/22/22. She wants to get back on Tysabri.     HISTORY OF PRESENT ILLNESS:  Virginia [name] is a 41 y.o. woman with relapsing remitting multiple sclerosis who also has back pain.  Update  06/27/2022: She converted to Kesimpta from Tysabri but felt she tolerated thee Tysabri much better and would like to switch back fine either way.  Just wait for lab work.  If we can still get them home either way is unchanged.   She denies any exacerbation.   Her last Kesimpta was June 10, 2022.   Besides feeling more tired, she also felt more dizzy.  Meclizine helped the vertigo some.    MRI of the brain 6/112023 showed  T2/FLAIR hyperintense foci in the cerebral hemispheres and the left medulla in a pattern consistent with chronic demyelinating plaque associated with multiple sclerosis.  None of the foci appear to be acute.  Compared to the MRI dated 01/20/2020, there are no new lesions.  Gait is about the same with mildly reduced balance.   She stumbles but no recent falls.  She uses the bannister going downstairs   She can walk > 1/2 without a break but does not keep up well with others.  .  She has burning in her legs, right > left helped some by gabapentin 600 mg po qid and amitriptyline 50 mg nightly.       She denies hearing loss.    Bladder function is fine.    Vision is fine.  She never had ON but had nystagmus in past, none recently  She is noting more fatigue.   She notes sleep is poor at night even with amitriptlne and gabapentin.      Mood is fine.     She has low back pain, worse when she stands.  Sometimes she gets pain in the feet as well.  She has a disc  herniation at L5-S1.  She also has neck pain but not arm pain.    Her chronic migraine is doing worse.  The Aimovig was discontinued as her co-pay was going to be very high.  She is still on amitriptyline.  When she was on both Aimovig and amitriptyline the migraines were doing very well.   She gets a few a month still and takes sumatriptan.  She has gained some weight over last 4 years but has lost some weight 21 ponds the past year.    MS History:   In mid May 2017, she was starting to experience occasional lightheadedness and a spinning vertigo. On 03/19/2016, she had more extreme vertigo and had an episode of syncope. She notices that when she was walking she would veer towards the right. Because of the syncope, she was taken to the emergency room. He had an MRI of the brain that showed several enhancing lesions, potentially worrisome for multiple sclerosis. Additional studies were performed. She received 3 days of IV steroids. The MRI's of the spine did not show any additional plaques. The lumbar puncture showed oligoclonal bands with normal IgG index, consistent with multiple sclerosis.   She started  Aubagio.   MRI in 2018 showed additional foci plus she had tolerability issues.  She has been on Tysabri since 12/26/2016.  She switched to [facility] 07/2021    LABS/LP She underwent a lumbar puncture on 03/21/2016. 4 oligoclonal bands present in the CSF which were not present in the serum, consistent with multiple sclerosis. The IgG index was high normal at 0.6.   There were only 4 white blood cells but 280 red blood cells more consistent with a slightly traumatic tap.    [facility] Spotted Fever CSF IgG was negative but the IgM was positive at 1.41 (less than 0.9 normal).    ANA and ANCA were both negative.  IMAGING MRI images from 03/20/2016, 03/22/2016 and CT angiogram images from 03/23/2016 reviewed: The MRI of the brain shows several 2/fair hyperintense foci, some in the periventricular  and juxtacortical white matter. Most of the foci enhanced after contrast administration. MRI of the cervical and thoracic spine did not show any spinal cord plaques and there was no significant degenerative change.   CT angiogram was essentially normal showing no significant stenosis.  MRI 11/28/2016 showed multiple periventricular, subcortical, pericallosal as well as craniovertebral junction white matter hyperintensities compatible with multiple sclerosis. Multiple enhancing lesions are noted in the periventricular regions with the largest one in the left posterior frontal subcortical white matter. Incidental large venous angioma is noted in the right parietal region. Compared with MRI scan dated 03/20/2016 there are several new enhancing as well as nonenhancing white matter lesions.  MRI 12/25/2016 of the cervical spine showed two T2 hyperintense foci within the left posterolateral spinal cord, one adjacent to C6-C7 and one adjacent to T1. There may have been subtle signal within the spinal cord adjacent to C6-C7 on the previous scan but the current focus is clearly larger. The focus adjacent to T1 was not evident on the previous MRI. Neither of these 2 foci enhanced with contrast.         There is mild spinal stenosis at C4-C5, C5-C6 and C6-C7, unchanged when compared to the previous MRI. There is mild right foraminal narrowing at C6-C7 but there is no nerve root compression.  MRI 01/10/2018 of the brain showed multiple T2/FLAIR hyperintense foci in the brainstem and in the periventricular, juxtacortical and deep white matter in a pattern and configuration consistent with chronic demyelinating plaque associated with multiple sclerosis.  Compared to the MRI dated 11/28/2016, there is an improved appearance in the lesions that were enhancing at that time no longer do so.  There are no new lesions.  MRI [facility] 01/27/2018 showed, at L4-L5, there is stable mild left facet hypertrophy and mild ligamentum flavum  hypertrophy.  There is no nerve root compression.     At L5-S1, there is a stable small central disc herniation causing bilateral lateral recess narrowing.  This contacts both of the S1 nerve roots.  There is no definite change when compared to the previous MRI.  MRI brain 01/17/2019 showed multiple T2/flair hyperintense foci in the hemispheres in a pattern and configuration consistent with chronic demyelinating plaque associated with multiple sclerosis.  Two foci that have been seen previously in the brainstem are not apparent on the current MRI.  There are no new lesions compared to the 2019 MRI.  None of the foci enhance.    There is a small developmental venous anomaly in the right parietal lobe that is unchanged in appearance.  Otherwise, the enhancement pattern is normal.  MRI of the brain 01/20/2020 showed scattered T2/FLAIR hyperintense foci in the hemispheres and medulla in a pattern and  configuration consistent with chronic demyelinating plaque associated with multiple sclerosis.   Compared to the MRI from 01/17/2019, there are no new lesions.  MRI of the brain 6/112023 showed  T2/FLAIR hyperintense foci in the cerebral hemispheres and the left medulla in a pattern consistent with chronic demyelinating plaque associated with multiple sclerosis.  None of the foci appear to be acute.  Compared to the MRI dated 01/20/2020, there are no new lesions.     REVIEW OF SYSTEMS: Constitutional: No fevers, chills, sweats, or change in appetite.   She has fatigue. Insomnia much better with temazepam Eyes: No visual changes, double vision, eye pain Ear, nose and throat: No hearing loss, ear pain, nasal congestion, sore throat Cardiovascular: No chest pain, palpitations Respiratory:  No shortness of breath at rest or with exertion.   No wheezes GastrointestinaI: No nausea, vomiting, diarrhea, abdominal pain, fecal incontinence Genitourinary:  No dysuria, urinary retention.   She reports frequency.  No  nocturia. Musculoskeletal:  No neck pain, back pain Integumentary: No rash, pruritus, skin lesions Neurological: as above Psychiatric: No depression at this time.  No anxiety Endocrine: No palpitations, diaphoresis, change in appetite, change in weigh or increased thirst Hematologic/Lymphatic:  No anemia, purpura, petechiae. Allergic/Immunologic: No itchy/runny eyes, nasal congestion, recent allergic reactions, rashes  ALLERGIES: Allergies  Allergen Reactions   Codeine Other (See Comments)    Hallucinations    Onion     swelling   Topamax [Topiramate]     Was talking out of her head, walked into walls    HOME MEDICATIONS:  Current Outpatient Medications:    AIMOVIG 140 MG/ML SOAJ, INJECT 140 MG INTO THE SKIN EVERY 30 (THIRTY) DAYS, Disp: 1 mL, Rfl: 5   amitriptyline (ELAVIL) 50 MG tablet, TAKE 1 TABLET BY MOUTH AT BEDTIME AS NEEDED FOR SLEEP, Disp: 30 tablet, Rfl: 8   atenolol (TENORMIN) 50 MG tablet, TAKE 1 TABLET BY MOUTH EVERY DAY (Patient taking differently: Take 50 mg by mouth at bedtime.), Disp: 30 tablet, Rfl: 11   clonazePAM (KLONOPIN) 1 MG tablet, TAKE 1 TABLET BY MOUTH TWICE A DAY AS NEEDED FOR ANXIETY, Disp: 20 tablet, Rfl: 1   diltiazem 2 % GEL, Apply 1 application topically 3 (three) times daily., Disp: 30 g, Rfl: 1   DULoxetine (CYMBALTA) 60 MG capsule, TAKE 1 CAPSULE BY MOUTH EVERY DAY, Disp: 90 capsule, Rfl: 2   HAILEY 24 FE 1-20 MG-MCG(24) tablet, TAKE 1 TABLET BY MOUTH EVERY DAY, Disp: 84 tablet, Rfl: 0   meclizine (ANTIVERT) 25 MG tablet, Take 1 tablet (25 mg total) by mouth 3 (three) times daily as needed for dizziness., Disp: 30 tablet, Rfl: 11   Ofatumumab (KESIMPTA) 20 MG/0.4ML SOAJ, Inject 0.4 mLs into the skin every 30 (thirty) days. 20mg  at week 0, 1, 2. 20mg  once monthly starting at week 4., Disp: 1.2 mL, Rfl: 3   pantoprazole (PROTONIX) 40 MG tablet, TAKE 1 TABLET BY MOUTH TWICE A DAY, Disp: 180 tablet, Rfl: 1   sucralfate (CARAFATE) 1 g tablet, TAKE 1  TABLET BY MOUTH 4 TIMES DAILY - WITH MEALS AND AT BEDTIME. (Patient taking differently: as needed.), Disp: 120 tablet, Rfl: 1   SUMAtriptan (IMITREX) 50 MG tablet, TAKE 1 TABLET EVERY 2 HRS AS NEEDED FOR MIGRAINE. MAY REPEAT IN 2 HRS IF HEADACHE PERSISTS OR RECURS, Disp: 10 tablet, Rfl: 5   temazepam (RESTORIL) 30 MG capsule, TAKE 1 CAPSULE (30 MG TOTAL) BY MOUTH AT BEDTIME AS NEEDED FOR SLEEP (DO NOT TAKE WITH KLONOPIN).,  Disp: 30 capsule, Rfl: 3   baclofen (LIORESAL) 10 MG tablet, TAKE 2TABLETS BY MOUTH 3 TIMES A DAY FOR SPASTICITY, Disp: 540 tablet, Rfl: 3   etodolac (LODINE) 400 MG tablet, Take 1 tablet (400 mg total) by mouth 2 (two) times daily., Disp: 60 tablet, Rfl: 5   gabapentin (NEURONTIN) 600 MG tablet, Take 1 tablet (600 mg total) by mouth 4 (four) times daily., Disp: 120 tablet, Rfl: 11  PAST MEDICAL HISTORY: Past Medical History:  Diagnosis Date   Anxiety    GERD (gastroesophageal reflux disease)    Headache    Insomnia    Multiple sclerosis (HCC)    dx 2017   Neuromuscular disorder (HCC)    Phreesia 06/30/2020   Nystagmus 02/15/2017   PAC (premature atrial contraction)    PVC (premature ventricular contraction)    Syncope and collapse    Tachycardia     PAST SURGICAL HISTORY: Past Surgical History:  Procedure Laterality Date   LEEP N/A 09/20/2021   Procedure: LOOP ELECTROSURGICAL EXCISION PROCEDURE (LEEP);  Surgeon: [name], MD;  Location: [facility] ORS;  Service: Gynecology;  Laterality: N/A;   NO PAST SURGERIES      FAMILY HISTORY: Family History  Problem Relation Age of Onset   Emphysema Mother    Osteoarthritis Mother    Anxiety disorder Mother    Uterine cancer Mother 47   Other Father    Suicidality Father    ALS Maternal Grandmother    Colon cancer Neg Hx    Colon polyps Neg Hx    Esophageal cancer Neg Hx    Rectal cancer Neg Hx    Stomach cancer Neg Hx     SOCIAL HISTORY:  Social History   Socioeconomic History   Marital status:  Married    Spouse name: Not on file   Number of children: Not on file   Years of education: Not on file   Highest education level: Not on file  Occupational History   Not on file  Tobacco Use   Smoking status: Former    Packs/day: 0.50    Years: 10.00    Total pack years: 5.00    Types: Cigarettes    Quit date: 2015    Years since quitting: 8.6   Smokeless tobacco: Never  Vaping Use   Vaping Use: Every day   Substances: Nicotine, Flavoring  Substance and Sexual Activity   Alcohol use: Yes    Alcohol/week: 0.0 standard drinks of alcohol    Comment: rarely   Drug use: No   Sexual activity: Yes  Other Topics Concern   Not on file  Social History Narrative   Not on file   Social Determinants of Health   Financial Resource Strain: Not on file  Food Insecurity: Not on file  Transportation Needs: Not on file  Physical Activity: Not on file  Stress: Not on file  Social Connections: Not on file  Intimate Partner Violence: Not on file     PHYSICAL EXAM  Vitals:   06/27/22 1549  BP: 132/81  Pulse: 79  Weight: 219 lb (99.3 kg)  Height: 5\' 4"  (1.626 m)    Body mass index is 37.59 kg/m.   General: The patient is well-developed and well-nourished and in no acute distress,.  She has mild pedal edema.  The knees are not erythematous or hot with normal range of motion  Neurologic Exam  Mental status: The patient is alert and oriented x 3 at the time of the  examination. The patient has apparent normal recent and remote memory, with an apparently normal attention span and concentration ability.   Speech is normal.  Cranial nerves: Extraocular muscles are full.  No nystagmus today..  Facial strength and sensation is normal. Trapezius strength is normal. No obvious hearing deficits are noted.  Motor:  Muscle bulk is normal.  Strength is 5/5. Tone is mildly increased in the legs.  Sensory: She had intact sensation to touch and vibration in arms but reduced sensation to  touch/temp and vibration in right leg.    Coordination: Cerebellar testing reveals good finger-nose-finger and heel-to-shin bilaterally.  Gait and station: Station is normal.   Her gait is arthritic.  Tandem gait is mildly wide  Romberg is negative.  Reflexes: Deep tendon reflexes are symmetric and normal in arms.  DTRs are brisk at knees with some spread but no ankle clonus.    DIAGNOSTIC DATA (LABS, IMAGING, TESTING) - I reviewed patient records, labs, notes, testing and imaging myself where available.  Lab Results  Component Value Date   WBC 9.2 03/22/2022   HGB 14.1 03/22/2022   HCT 41.8 03/22/2022   MCV 94 03/22/2022   PLT 348 03/22/2022      Component Value Date/Time   NA 141 10/03/2021 1645   NA 141 09/01/2021 0957   K 3.7 10/03/2021 1645   CL 108 10/03/2021 1645   CO2 23 10/03/2021 1645   GLUCOSE 120 (H) 10/03/2021 1645   BUN 12 10/03/2021 1645   BUN 14 09/01/2021 0957   CREATININE 0.91 10/03/2021 1645   CALCIUM 8.6 10/03/2021 1645   PROT 6.6 03/22/2022 1347   ALBUMIN 3.8 03/22/2022 1347   AST 10 03/22/2022 1347   ALT 16 03/22/2022 1347   ALKPHOS 96 03/22/2022 1347   BILITOT <0.2 03/22/2022 1347   GFRNONAA >60 09/27/2021 1656   GFRNONAA 80 08/24/2020 1643   GFRAA 93 08/24/2020 1643    Lab Results  Component Value Date   TSH 1.010 01/01/2018       ASSESSMENT AND PLAN    1. Multiple sclerosis (HCC)   2. High risk medication use   3. Relapsing remitting multiple sclerosis (HCC)   4. Dysesthesia   5. Insomnia, unspecified type   6. Chronic migraine w/o aura w/o status migrainosus, not intractable      1.   We will switch back to Tysabri.  Check CBC with differential and JCV antibody.  I would want her to wait about 3 months after her last Kesimpta (which would be early November) before having her first Tysabri infusion 2.   Continue baclofen up to 60 mg in split dose a day   3.   Continue gabapentin 600 mg 3-4 times a day.  If dysesthesias worsen,  consider lamotrigine.    4.   For headaches, continue amitriptyline 50 mg nightly.   Sumatriptan for breakthrough. 5.   Try to exercise and lose weight. Return 5-6 months.  Call sooner if there are new or worsening neurologic symptoms.  Milia Warth A. Epimenio Foot, MD, PhD 06/27/2022, 5:29 PM Certified in Neurology, Clinical Neurophysiology, Sleep Medicine, Pain Medicine and Neuroimaging  [facility] [address], [address] [place], [place] [address] [phone]

## 2022-06-28 LAB — CBC WITH DIFFERENTIAL/PLATELET
Basophils Absolute: 0.1 x10E3/uL (ref 0.0–0.2)
Basos: 1 %
EOS (ABSOLUTE): 0.1 x10E3/uL (ref 0.0–0.4)
Eos: 2 %
Hematocrit: 43.2 % (ref 34.0–46.6)
Hemoglobin: 14.7 g/dL (ref 11.1–15.9)
Immature Grans (Abs): 0 x10E3/uL (ref 0.0–0.1)
Immature Granulocytes: 0 %
Lymphocytes Absolute: 1.3 x10E3/uL (ref 0.7–3.1)
Lymphs: 18 %
MCH: 32.4 pg (ref 26.6–33.0)
MCHC: 34 g/dL (ref 31.5–35.7)
MCV: 95 fL (ref 79–97)
Monocytes Absolute: 0.5 x10E3/uL (ref 0.1–0.9)
Monocytes: 7 %
Neutrophils Absolute: 5 x10E3/uL (ref 1.4–7.0)
Neutrophils: 72 %
Platelets: 345 x10E3/uL (ref 150–450)
RBC: 4.54 x10E6/uL (ref 3.77–5.28)
RDW: 12.5 % (ref 11.7–15.4)
WBC: 6.9 x10E3/uL (ref 3.4–10.8)

## 2022-07-04 ENCOUNTER — Ambulatory Visit (INDEPENDENT_AMBULATORY_CARE_PROVIDER_SITE_OTHER): Payer: BC Managed Care – PPO | Admitting: Family Medicine

## 2022-07-04 VITALS — BP 122/70 | HR 79 | Ht 64.0 in | Wt 216.6 lb

## 2022-07-04 DIAGNOSIS — K6289 Other specified diseases of anus and rectum: Secondary | ICD-10-CM

## 2022-07-04 MED ORDER — TEMAZEPAM 30 MG PO CAPS: 30.0000 mg | ORAL_CAPSULE | Freq: Every evening | ORAL | 3 refills | Status: DC | PRN

## 2022-07-04 MED ORDER — HYDROCORTISONE 2.5 % EX CREA
TOPICAL_CREAM | Freq: Two times a day (BID) | CUTANEOUS | 0 refills | Status: DC
Start: 2022-07-04 — End: 2023-01-04

## 2022-07-04 NOTE — Progress Notes (Signed)
Subjective:    Patient ID: Virginia [name], female    DOB: 1980-11-22, 41 y.o.   MRN: [id number]  Patient is a 41 year old Caucasian female here today to answer some questions regarding her medications.  First she is on temazepam 30 mg p.o. nightly for insomnia.  Apparently the pharmacy is only been giving her 15 tablets/month.  She is written to take it every night.  She is taking less Klonopin.  I am willing to give her 30 tablets of temazepam that she takes only at bedtime.  Therefore I sent a prescription for this to her pharmacy.  Second she reports rectal pain.  In the past she was treated for rectal fissure by gastroenterology with diltiazem gel.  She states that she is having pain with defecation around her rectum.  However the patient politely refuses to allow me to do a rectal exam out of embarrassment.  Therefore was impossible to determine exactly what the cause of her pain is.  She believes she has a hemorrhoid.  She states she feels like there is a lump around her rectum that hurts every time she defecates.  Again she will not allow me to perform a rectal exam.  Past Medical History:  Diagnosis Date   Anxiety    GERD (gastroesophageal reflux disease)    Headache    Insomnia    Multiple sclerosis (HCC)    dx 2017   Neuromuscular disorder (HCC)    Phreesia 06/30/2020   Nystagmus 02/15/2017   PAC (premature atrial contraction)    PVC (premature ventricular contraction)    Syncope and collapse    Tachycardia    Past Surgical History:  Procedure Laterality Date   LEEP N/A 09/20/2021   Procedure: LOOP ELECTROSURGICAL EXCISION PROCEDURE (LEEP);  Surgeon: [name], MD;  Location: [facility] ORS;  Service: Gynecology;  Laterality: N/A;   NO PAST SURGERIES     Current Outpatient Medications on File Prior to Visit  Medication Sig Dispense Refill   AIMOVIG 140 MG/ML SOAJ INJECT 140 MG INTO THE SKIN EVERY 30 (THIRTY) DAYS 1 mL 5   amitriptyline (ELAVIL) 50 MG tablet TAKE 1 TABLET BY  MOUTH AT BEDTIME AS NEEDED FOR SLEEP 30 tablet 8   atenolol (TENORMIN) 50 MG tablet TAKE 1 TABLET BY MOUTH EVERY DAY (Patient taking differently: Take 50 mg by mouth at bedtime.) 30 tablet 11   baclofen (LIORESAL) 10 MG tablet TAKE 2TABLETS BY MOUTH 3 TIMES A DAY FOR SPASTICITY 540 tablet 3   clonazePAM (KLONOPIN) 1 MG tablet TAKE 1 TABLET BY MOUTH TWICE A DAY AS NEEDED FOR ANXIETY 20 tablet 1   diltiazem 2 % GEL Apply 1 application topically 3 (three) times daily. 30 g 1   DULoxetine (CYMBALTA) 60 MG capsule TAKE 1 CAPSULE BY MOUTH EVERY DAY 90 capsule 2   etodolac (LODINE) 400 MG tablet Take 1 tablet (400 mg total) by mouth 2 (two) times daily. 60 tablet 5   gabapentin (NEURONTIN) 600 MG tablet Take 1 tablet (600 mg total) by mouth 4 (four) times daily. 120 tablet 11   HAILEY 24 FE 1-20 MG-MCG(24) tablet TAKE 1 TABLET BY MOUTH EVERY DAY 84 tablet 0   meclizine (ANTIVERT) 25 MG tablet Take 1 tablet (25 mg total) by mouth 3 (three) times daily as needed for dizziness. 30 tablet 11   Ofatumumab (KESIMPTA) 20 MG/0.4ML SOAJ Inject 0.4 mLs into the skin every 30 (thirty) days. 20mg  at week 0, 1, 2. 20mg  once monthly starting at  week 4. 1.2 mL 3   pantoprazole (PROTONIX) 40 MG tablet TAKE 1 TABLET BY MOUTH TWICE A DAY 180 tablet 1   sucralfate (CARAFATE) 1 g tablet TAKE 1 TABLET BY MOUTH 4 TIMES DAILY - WITH MEALS AND AT BEDTIME. (Patient taking differently: as needed.) 120 tablet 1   SUMAtriptan (IMITREX) 50 MG tablet TAKE 1 TABLET EVERY 2 HRS AS NEEDED FOR MIGRAINE. MAY REPEAT IN 2 HRS IF HEADACHE PERSISTS OR RECURS 10 tablet 5   No current facility-administered medications on file prior to visit.   Allergies  Allergen Reactions   Codeine Other (See Comments)    Hallucinations    Onion     swelling   Topamax [Topiramate]     Was talking out of her head, walked into walls   Social History   Socioeconomic History   Marital status: Married    Spouse name: Not on file   Number of children:  Not on file   Years of education: Not on file   Highest education level: Not on file  Occupational History   Not on file  Tobacco Use   Smoking status: Former    Packs/day: 0.50    Years: 10.00    Total pack years: 5.00    Types: Cigarettes    Quit date: 2015    Years since quitting: 8.6   Smokeless tobacco: Never  Vaping Use   Vaping Use: Every day   Substances: Nicotine, Flavoring  Substance and Sexual Activity   Alcohol use: Yes    Alcohol/week: 0.0 standard drinks of alcohol    Comment: rarely   Drug use: No   Sexual activity: Yes  Other Topics Concern   Not on file  Social History Narrative   Not on file   Social Determinants of Health   Financial Resource Strain: Not on file  Food Insecurity: Not on file  Transportation Needs: Not on file  Physical Activity: Not on file  Stress: Not on file  Social Connections: Not on file  Intimate Partner Violence: Not on file      Review of Systems     Objective:   Physical Exam Constitutional:      Appearance: Normal appearance. She is obese.  Cardiovascular:     Rate and Rhythm: Normal rate and regular rhythm.     Heart sounds: Normal heart sounds. No murmur heard. Pulmonary:     Effort: Pulmonary effort is normal. No respiratory distress.     Breath sounds: Normal breath sounds. No stridor. No wheezing, rhonchi or rales.  Neurological:     General: No focal deficit present.     Mental Status: She is alert and oriented to person, place, and time. Mental status is at baseline.     Cranial Nerves: No cranial nerve deficit.         Assessment & Plan:  Rectal pain Is impossible to determine what the cause of her rectal pain is without the exam however the patient feels confident she has a hemorrhoid.  I will give her hydrocortisone 2.5% cream to apply twice a daily to the affected area.  If not better after 7 to 10 days, I would need to do a rectal exam to determine what the cause of the pain is.  I did refill  her temazepam for 30 capsules at night she is to take 30 mg p.o. nightly as needed insomnia. Her legs are

## 2022-07-05 NOTE — Telephone Encounter (Signed)
JCV antibody was negative and the index value was 0.17. will place in Dr Bonnita Hollow review bin.

## 2022-07-10 ENCOUNTER — Other Ambulatory Visit: Payer: Self-pay | Admitting: Family Medicine

## 2022-07-15 ENCOUNTER — Other Ambulatory Visit: Payer: Self-pay | Admitting: Family Medicine

## 2022-07-17 ENCOUNTER — Other Ambulatory Visit: Payer: Self-pay | Admitting: Family Medicine

## 2022-07-17 DIAGNOSIS — F411 Generalized anxiety disorder: Secondary | ICD-10-CM

## 2022-07-17 NOTE — Telephone Encounter (Signed)
Requested medication (s) are due for refill today: Yes  Requested medication (s) are on the active medication list: Yes  Last refill:  06/19/22  Future visit scheduled: No  Notes to clinic:  Pharmacy requesting 90 day supply.    Requested Prescriptions  Pending Prescriptions Disp Refills   amitriptyline (ELAVIL) 50 MG tablet [Pharmacy Med Name: AMITRIPTYLINE HCL 50 MG TAB] 90 tablet 3    Sig: TAKE 1 TABLET BY MOUTH EVERY DAY AT BEDTIME AS NEEDED FOR SLEEP     Psychiatry:  Antidepressants - Heterocyclics (TCAs) Failed - 07/15/2022 10:32 AM      Failed - Valid encounter within last 6 months    Recent Outpatient Visits           9 months ago Pelvic pain   [place] Susy Frizzle, MD   11 months ago Viral upper respiratory tract infection   [place] Susy Frizzle, MD   12 months ago Elevated serum creatinine   [place] Pickard, Cammie Mcgee, MD   1 year ago Cervical radiculopathy   [place] Dennard Schaumann Cammie Mcgee, MD   1 year ago Diarrhea, unspecified type   [place], NP              Passed - Completed PHQ-2 or PHQ-9 in the last 360 days

## 2022-07-18 ENCOUNTER — Telehealth: Payer: Self-pay | Admitting: Family Medicine

## 2022-07-18 NOTE — Telephone Encounter (Signed)
Requested medication (s) are due for refill today:   Provider to review  Requested medication (s) are on the active medication list:   Yes  Future visit scheduled:   Yes 9/22   Last ordered: 05/29/2022 #20, 1 refill  Non delegated refill   Requested Prescriptions  Pending Prescriptions Disp Refills   clonazePAM (KLONOPIN) 1 MG tablet [Pharmacy Med Name: CLONAZEPAM 1 MG TABLET] 20 tablet 1    Sig: TAKE 1 TABLET BY MOUTH TWICE A DAY AS NEEDED FOR ANXIETY     Not Delegated - Psychiatry: Anxiolytics/Hypnotics 2 Failed - 07/17/2022 10:15 PM      Failed - This refill cannot be delegated      Failed - Urine Drug Screen completed in last 360 days      Failed - Valid encounter within last 6 months    Recent Outpatient Visits           9 months ago Pelvic pain   [place] Susy Frizzle, MD   11 months ago Viral upper respiratory tract infection   [place] Susy Frizzle, MD   12 months ago Elevated serum creatinine   [place] Pickard, Cammie Mcgee, MD   1 year ago Cervical radiculopathy   [place] Dennard Schaumann Cammie Mcgee, MD   1 year ago Diarrhea, unspecified type   [place] Eulogio Bear, NP       Future Appointments             In 3 days Pickard, Cammie Mcgee, MD [place], [place] - Patient is not pregnant

## 2022-07-18 NOTE — Telephone Encounter (Signed)
Received eFax from pharmacy for   temazepam (RESTORIL) 30 MG capsule [[id number]]   Pharmacy requesting alternative.  Pharmacy:  [id number] - [place], [address] [facility]  [place], [place] [address]  Phone:  [phone]  Fax:  [phone]  DEA #:  [id number]  LOV: 07/04/2022  Please advise pharmacist at [phone].

## 2022-07-19 ENCOUNTER — Encounter: Payer: Self-pay | Admitting: Family Medicine

## 2022-07-20 ENCOUNTER — Other Ambulatory Visit: Payer: Self-pay | Admitting: Family Medicine

## 2022-07-21 ENCOUNTER — Ambulatory Visit (INDEPENDENT_AMBULATORY_CARE_PROVIDER_SITE_OTHER): Payer: BC Managed Care – PPO | Admitting: Family Medicine

## 2022-07-21 VITALS — BP 128/72 | HR 77 | Temp 97.8°F | Ht 64.0 in | Wt 206.4 lb

## 2022-07-21 DIAGNOSIS — K909 Intestinal malabsorption, unspecified: Secondary | ICD-10-CM

## 2022-07-21 DIAGNOSIS — K602 Anal fissure, unspecified: Secondary | ICD-10-CM

## 2022-07-21 DIAGNOSIS — R197 Diarrhea, unspecified: Secondary | ICD-10-CM

## 2022-07-21 MED ORDER — DILTIAZEM GEL 2 %: 1.0000 | Freq: Three times a day (TID) | CUTANEOUS | 1 refills | Status: DC

## 2022-07-21 MED ORDER — PRAMOXINE-HC 1-2.5 % EX CREA: TOPICAL_CREAM | Freq: Three times a day (TID) | CUTANEOUS | 0 refills | Status: DC

## 2022-07-21 NOTE — Progress Notes (Signed)
Subjective:    Patient ID: Virginia [name], female    DOB: 1980-11-21, 41 y.o.   MRN: [id number] 07/04/22 Patient is a 41 year old Caucasian female here today to answer some questions regarding her medications.  First she is on temazepam 30 mg p.o. nightly for insomnia.  Apparently the pharmacy is only been giving her 15 tablets/month.  She is written to take it every night.  She is taking less Klonopin.  I am willing to give her 30 tablets of temazepam that she takes only at bedtime.  Therefore I sent a prescription for this to her pharmacy.  Second she reports rectal pain.  In the past she was treated for rectal fissure by gastroenterology with diltiazem gel.  She states that she is having pain with defecation around her rectum.  However the patient politely refuses to allow me to do a rectal exam out of embarrassment.  Therefore was impossible to determine exactly what the cause of her pain is.  She believes she has a hemorrhoid.  She states she feels like there is a lump around her rectum that hurts every time she defecates.  Again she will not allow me to perform a rectal exam. At that time, my plan was: Is impossible to determine what the cause of her rectal pain is without the exam however the patient feels confident she has a hemorrhoid.  I will give her hydrocortisone 2.5% cream to apply twice a daily to the affected area.  If not better after 7 to 10 days, I would need to do a rectal exam to determine what the cause of the pain is.  I did refill her temazepam for 30 capsules at night she is to take 30 mg p.o. nightly as needed insomnia.  07/21/22 Patient states that the hemorrhoids are getting worse.  She reports rectal bleeding.  She states that every time she puts hydrocortisone cream on the hemorrhoids, they burn.  She consents today to a rectal exam.  With a chaperone present, I performed rectal exam.  She has 2 skin tags from previous hemorrhoids but there is no active inflammation.  However she  has 2, 2 mm anal fissures located at 12:00 and also at 10:00.  I believe this is the area from the bleeding and the pain.  She states that for the last 4 weeks she has been dealing with severe diarrhea.  She has had no international travel.  She states that she has gone 8 times already this morning to the bathroom.  She states that soon as she eats something it comes right through her within a few minutes.  When she evaluates the diarrhea there are bits of undigested food in the toilet bowl.  She is also seeing blood but she attributes this to the "hemorrhoids".  She reports feeling nauseated and tired.  She is under more stress that she is currently separated from her husband Past Medical History:  Diagnosis Date   Anxiety    GERD (gastroesophageal reflux disease)    Headache    Insomnia    Multiple sclerosis (HCC)    dx 2017   Neuromuscular disorder (HCC)    Phreesia 06/30/2020   Nystagmus 02/15/2017   PAC (premature atrial contraction)    PVC (premature ventricular contraction)    Syncope and collapse    Tachycardia    Past Surgical History:  Procedure Laterality Date   LEEP N/A 09/20/2021   Procedure: LOOP ELECTROSURGICAL EXCISION PROCEDURE (LEEP);  Surgeon: [name], MD;  Location: [facility] OR[facility];  Service: Gynecology;  Laterality: N/A;   NO PAST SURGERIES     Current Outpatient Medications on File Prior to Visit  Medication Sig Dispense Refill   AIMOVIG 140 MG/ML SOAJ INJECT 140 MG INTO THE SKIN EVERY 30 (THIRTY) DAYS 1 mL 5   amitriptyline (ELAVIL) 50 MG tablet TAKE 1 TABLET BY MOUTH EVERY DAY AT BEDTIME AS NEEDED FOR SLEEP 90 tablet 3   atenolol (TENORMIN) 50 MG tablet TAKE 1 TABLET BY MOUTH EVERY DAY (Patient taking differently: Take 50 mg by mouth at bedtime.) 30 tablet 11   baclofen (LIORESAL) 10 MG tablet TAKE 2TABLETS BY MOUTH 3 TIMES A DAY FOR SPASTICITY 540 tablet 3   clonazePAM (KLONOPIN) 1 MG tablet TAKE 1 TABLET BY MOUTH TWICE A DAY AS NEEDED FOR ANXIETY 20 tablet  1   DULoxetine (CYMBALTA) 60 MG capsule TAKE 1 CAPSULE BY MOUTH EVERY DAY 90 capsule 2   etodolac (LODINE) 400 MG tablet Take 1 tablet (400 mg total) by mouth 2 (two) times daily. 60 tablet 5   gabapentin (NEURONTIN) 600 MG tablet Take 1 tablet (600 mg total) by mouth 4 (four) times daily. 120 tablet 11   HAILEY 24 FE 1-20 MG-MCG(24) tablet TAKE 1 TABLET BY MOUTH EVERY DAY 84 tablet 0   hydrocortisone 2.5 % cream Apply topically 2 (two) times daily. 30 g 0   meclizine (ANTIVERT) 25 MG tablet Take 1 tablet (25 mg total) by mouth 3 (three) times daily as needed for dizziness. 30 tablet 11   Ofatumumab (KESIMPTA) 20 MG/0.4ML SOAJ Inject 0.4 mLs into the skin every 30 (thirty) days. 20mg  at week 0, 1, 2. 20mg  once monthly starting at week 4. 1.2 mL 3   pantoprazole (PROTONIX) 40 MG tablet TAKE 1 TABLET BY MOUTH TWICE A DAY 180 tablet 1   sucralfate (CARAFATE) 1 g tablet TAKE 1 TABLET BY MOUTH 4 TIMES DAILY - WITH MEALS AND AT BEDTIME. (Patient taking differently: as needed.) 120 tablet 1   SUMAtriptan (IMITREX) 50 MG tablet TAKE 1 TABLET EVERY 2 HRS AS NEEDED FOR MIGRAINE. MAY REPEAT IN 2 HRS IF HEADACHE PERSISTS OR RECURS 10 tablet 5   temazepam (RESTORIL) 30 MG capsule TAKE 1 CAPSULE (30 MG TOTAL) BY MOUTH AT BEDTIME AS NEEDED FOR SLEEP (DO NOT TAKE WITH KLONOPIN). 30 capsule 1   No current facility-administered medications on file prior to visit.   Allergies  Allergen Reactions   Codeine Other (See Comments)    Hallucinations    Onion     swelling   Topamax [Topiramate]     Was talking out of her head, walked into walls   Social History   Socioeconomic History   Marital status: Married    Spouse name: Not on file   Number of children: Not on file   Years of education: Not on file   Highest education level: Not on file  Occupational History   Not on file  Tobacco Use   Smoking status: Former    Packs/day: 0.50    Years: 10.00    Total pack years: 5.00    Types: Cigarettes     Quit date: 2015    Years since quitting: 8.7   Smokeless tobacco: Never  Vaping Use   Vaping Use: Every day   Substances: Nicotine, Flavoring  Substance and Sexual Activity   Alcohol use: Yes    Alcohol/week: 0.0 standard drinks of alcohol    Comment: rarely   Drug use: No  Sexual activity: Yes  Other Topics Concern   Not on file  Social History Narrative   Not on file   Social Determinants of Health   Financial Resource Strain: Not on file  Food Insecurity: Not on file  Transportation Needs: Not on file  Physical Activity: Not on file  Stress: Not on file  Social Connections: Not on file  Intimate Partner Violence: Not on file      Review of Systems  All other systems reviewed and are negative.      Objective:   Physical Exam Exam conducted with a chaperone present.  Constitutional:      Appearance: Normal appearance. She is obese.  Cardiovascular:     Rate and Rhythm: Normal rate and regular rhythm.     Heart sounds: Normal heart sounds. No murmur heard. Pulmonary:     Effort: Pulmonary effort is normal. No respiratory distress.     Breath sounds: Normal breath sounds. No stridor. No wheezing, rhonchi or rales.  Genitourinary:    Rectum: Tenderness, anal fissure and external hemorrhoid present.  Neurological:     General: No focal deficit present.     Mental Status: She is alert and oriented to person, place, and time. Mental status is at baseline.     Cranial Nerves: No cranial nerve deficit.         Assessment & Plan:  Diarrhea due to malabsorption - Plan: GI pathogen panel by PCR, stool  Anal fissure Patient has 2 large skin tags from previous hemorrhoids however there is no active external hemorrhoid other than the skin tags.  There is no evidence of inflammation or thrombosis in the skin tags.  However the patient has 2 anal fissures that appear quite painful.  I believe this is the cause of her rectal pain.  I recommended diltiazem 2% gel be  applied 3 times daily for the next 4 weeks to allow these to heal.  The patient states that she has tried this in the past without any success however I explained to the patient that to allow anal fissures to heal you have to relax the rectal sphincter to prevent tearing and that is the goal of the diltiazem gel.  Also gave her per month seen with hydrocortisone primarily as a topical anesthetic to help with the pain.  I am concerned by the diarrhea.  I will to rule out infectious diarrhea first with a GI pathogen panel.  If the GI pathogen panel is negative, I will consult GI if she would definitely require a colonoscopy to evaluate for inflammatory bowel disease.  If there is no evidence of inflammatory bowel disease, this could be potentially IBS due to the stress that she is currently enduring.

## 2022-07-24 ENCOUNTER — Other Ambulatory Visit: Payer: BC Managed Care – PPO

## 2022-07-24 DIAGNOSIS — K909 Intestinal malabsorption, unspecified: Secondary | ICD-10-CM | POA: Diagnosis not present

## 2022-07-24 DIAGNOSIS — R197 Diarrhea, unspecified: Secondary | ICD-10-CM | POA: Diagnosis not present

## 2022-07-24 NOTE — Progress Notes (Unsigned)
PCP:  [name], MD   No chief complaint on file.    HPI:      [name] is a 41 y.o. G1P1001 whose LMP was No LMP recorded., presents today for her annual examination.  Her menses are {norm/abn:715}, lasting {number: 22536} days.  Dysmenorrhea {dysmen:716}. She {does:18564} have intermenstrual bleeding.  Sex activity: {sex active: 315163}.  Last Pap: 01/18/22  Results were: no abnormalities  Hx of STDs: {STD hx:14358} Her last PAP was   4 mos  ago and was abnormal: ASC-H . She has had a prior colposcopy (CIN II in 02/2021). Prior biopsies (if done) were  abnormal.  She had Cryo to cervix 02/2021.  She had LEEP with CIN 3 08/2021 after PAP found to still be abnormal   Last mammogram: 06/05/22 Results were: normal--routine follow-up in 12 months There is no FH of breast cancer. There is no FH of ovarian cancer. The patient {does:18564} do self-breast exams.  Tobacco use: {tob:20664} Alcohol use: {Alcohol:11675} No drug use.  Exercise: {exercise:31265}  She {does:18564} get adequate calcium and Vitamin D in her diet.  Patient Active Problem List   Diagnosis Date Noted   Chronic migraine w/o aura w/o status migrainosus, not intractable 09/01/2021   CIN II (cervical intraepithelial neoplasia II) 03/18/2021   LGSIL on Pap smear of cervix 02/28/2021   Vertigo of central origin 06/24/2020   Numbness 06/24/2020   Panic disorder 11/11/2019   Current mild episode of major depressive disorder without prior episode (HCC) 11/11/2019   Snoring 06/20/2018   Excessive daytime sleepiness 06/20/2018   Neck pain 01/01/2018   Palpitations 01/01/2018   Myalgia 09/03/2017   Chronic low back pain without sciatica 09/03/2017   Right leg pain 07/26/2017   Bilateral sciatica 07/12/2017   Nystagmus 02/15/2017   Spell of altered consciousness 07/19/2016   Ataxic gait 05/31/2016   Other fatigue 05/31/2016   Dysesthesia 05/31/2016   High risk medication use 03/31/2016   Insomnia  03/31/2016   Dizziness    Syncope    Multiple sclerosis (HCC) 03/21/2016   Relapsing remitting multiple sclerosis (HCC)    Vertigo    White matter abnormality on MRI of brain 03/20/2016   Faintness     Past Surgical History:  Procedure Laterality Date   LEEP N/A 09/20/2021   Procedure: LOOP ELECTROSURGICAL EXCISION PROCEDURE (LEEP);  Surgeon: [name], MD;  Location: [facility] ORS;  Service: Gynecology;  Laterality: N/A;   NO PAST SURGERIES      Family History  Problem Relation Age of Onset   Emphysema Mother    Osteoarthritis Mother    Anxiety disorder Mother    Uterine cancer Mother 35   Other Father    Suicidality Father    ALS Maternal Grandmother    Colon cancer Neg Hx    Colon polyps Neg Hx    Esophageal cancer Neg Hx    Rectal cancer Neg Hx    Stomach cancer Neg Hx     Social History   Socioeconomic History   Marital status: Married    Spouse name: Not on file   Number of children: Not on file   Years of education: Not on file   Highest education level: Not on file  Occupational History   Not on file  Tobacco Use   Smoking status: Former    Packs/day: 0.50    Years: 10.00    Total pack years: 5.00    Types: Cigarettes    Quit  date: 2015    Years since quitting: 8.7   Smokeless tobacco: Never  Vaping Use   Vaping Use: Every day   Substances: Nicotine, Flavoring  Substance and Sexual Activity   Alcohol use: Yes    Alcohol/week: 0.0 standard drinks of alcohol    Comment: rarely   Drug use: No   Sexual activity: Yes  Other Topics Concern   Not on file  Social History Narrative   Not on file   Social Determinants of Health   Financial Resource Strain: Not on file  Food Insecurity: Not on file  Transportation Needs: Not on file  Physical Activity: Not on file  Stress: Not on file  Social Connections: Not on file  Intimate Partner Violence: Not on file     Current Outpatient Medications:    AIMOVIG 140 MG/ML SOAJ, INJECT 140 MG INTO  THE SKIN EVERY 30 (THIRTY) DAYS, Disp: 1 mL, Rfl: 5   amitriptyline (ELAVIL) 50 MG tablet, TAKE 1 TABLET BY MOUTH EVERY DAY AT BEDTIME AS NEEDED FOR SLEEP, Disp: 90 tablet, Rfl: 3   atenolol (TENORMIN) 50 MG tablet, TAKE 1 TABLET BY MOUTH EVERY DAY (Patient taking differently: Take 50 mg by mouth at bedtime.), Disp: 30 tablet, Rfl: 11   baclofen (LIORESAL) 10 MG tablet, TAKE 2TABLETS BY MOUTH 3 TIMES A DAY FOR SPASTICITY, Disp: 540 tablet, Rfl: 3   clonazePAM (KLONOPIN) 1 MG tablet, TAKE 1 TABLET BY MOUTH TWICE A DAY AS NEEDED FOR ANXIETY, Disp: 20 tablet, Rfl: 1   diltiazem 2 % GEL, Apply 1 Application topically 3 (three) times daily., Disp: 60 g, Rfl: 1   DULoxetine (CYMBALTA) 60 MG capsule, TAKE 1 CAPSULE BY MOUTH EVERY DAY, Disp: 90 capsule, Rfl: 2   etodolac (LODINE) 400 MG tablet, Take 1 tablet (400 mg total) by mouth 2 (two) times daily., Disp: 60 tablet, Rfl: 5   gabapentin (NEURONTIN) 600 MG tablet, Take 1 tablet (600 mg total) by mouth 4 (four) times daily., Disp: 120 tablet, Rfl: 11   HAILEY 24 FE 1-20 MG-MCG(24) tablet, TAKE 1 TABLET BY MOUTH EVERY DAY, Disp: 84 tablet, Rfl: 0   hydrocortisone 2.5 % cream, Apply topically 2 (two) times daily., Disp: 30 g, Rfl: 0   meclizine (ANTIVERT) 25 MG tablet, Take 1 tablet (25 mg total) by mouth 3 (three) times daily as needed for dizziness., Disp: 30 tablet, Rfl: 11   Ofatumumab (KESIMPTA) 20 MG/0.4ML SOAJ, Inject 0.4 mLs into the skin every 30 (thirty) days. 20mg  at week 0, 1, 2. 20mg  once monthly starting at week 4., Disp: 1.2 mL, Rfl: 3   pantoprazole (PROTONIX) 40 MG tablet, TAKE 1 TABLET BY MOUTH TWICE A DAY, Disp: 180 tablet, Rfl: 1   pramoxine-hydrocortisone cream, Apply topically 3 (three) times daily., Disp: 57 g, Rfl: 0   sucralfate (CARAFATE) 1 g tablet, TAKE 1 TABLET BY MOUTH 4 TIMES DAILY - WITH MEALS AND AT BEDTIME. (Patient taking differently: as needed.), Disp: 120 tablet, Rfl: 1   SUMAtriptan (IMITREX) 50 MG tablet, TAKE 1 TABLET  EVERY 2 HRS AS NEEDED FOR MIGRAINE. MAY REPEAT IN 2 HRS IF HEADACHE PERSISTS OR RECURS, Disp: 10 tablet, Rfl: 5   temazepam (RESTORIL) 30 MG capsule, TAKE 1 CAPSULE (30 MG TOTAL) BY MOUTH AT BEDTIME AS NEEDED FOR SLEEP (DO NOT TAKE WITH KLONOPIN)., Disp: 30 capsule, Rfl: 1     ROS:  Review of Systems BREAST: No symptoms   Objective: There were no vitals taken for this visit.  OBGyn Exam  Results: No results found for this or any previous visit (from the past 24 hour(s)).  Assessment/Plan: No diagnosis found.  No orders of the defined types were placed in this encounter.            GYN counsel {counseling: 16159}     F/U  No follow-ups on file.  Jakeya Gherardi B. Zyon Rosser, PA-C 07/24/2022 4:55 PM

## 2022-07-25 ENCOUNTER — Other Ambulatory Visit (HOSPITAL_COMMUNITY)
Admission: RE | Admit: 2022-07-25 | Discharge: 2022-07-25 | Disposition: A | Discharge status: Home / Self Care | Payer: BC Managed Care – PPO | Source: Ambulatory Visit | Attending: Obstetrics and Gynecology | Admitting: Obstetrics and Gynecology

## 2022-07-25 ENCOUNTER — Ambulatory Visit (INDEPENDENT_AMBULATORY_CARE_PROVIDER_SITE_OTHER): Payer: BC Managed Care – PPO | Admitting: Obstetrics and Gynecology

## 2022-07-25 ENCOUNTER — Encounter: Payer: Self-pay | Admitting: Obstetrics and Gynecology

## 2022-07-25 VITALS — BP 100/70 | Ht 64.0 in | Wt 212.0 lb

## 2022-07-25 DIAGNOSIS — Z1151 Encounter for screening for human papillomavirus (HPV): Secondary | ICD-10-CM

## 2022-07-25 DIAGNOSIS — N871 Moderate cervical dysplasia: Secondary | ICD-10-CM | POA: Insufficient documentation

## 2022-07-25 DIAGNOSIS — Z131 Encounter for screening for diabetes mellitus: Secondary | ICD-10-CM

## 2022-07-25 DIAGNOSIS — Z124 Encounter for screening for malignant neoplasm of cervix: Secondary | ICD-10-CM | POA: Insufficient documentation

## 2022-07-25 DIAGNOSIS — Z1329 Encounter for screening for other suspected endocrine disorder: Secondary | ICD-10-CM

## 2022-07-25 DIAGNOSIS — N939 Abnormal uterine and vaginal bleeding, unspecified: Secondary | ICD-10-CM

## 2022-07-25 DIAGNOSIS — N9089 Other specified noninflammatory disorders of vulva and perineum: Secondary | ICD-10-CM | POA: Diagnosis not present

## 2022-07-25 DIAGNOSIS — L918 Other hypertrophic disorders of the skin: Secondary | ICD-10-CM | POA: Diagnosis not present

## 2022-07-25 DIAGNOSIS — Z01419 Encounter for gynecological examination (general) (routine) without abnormal findings: Secondary | ICD-10-CM | POA: Diagnosis not present

## 2022-07-25 DIAGNOSIS — N921 Excessive and frequent menstruation with irregular cycle: Secondary | ICD-10-CM

## 2022-07-25 DIAGNOSIS — Z1231 Encounter for screening mammogram for malignant neoplasm of breast: Secondary | ICD-10-CM

## 2022-07-25 DIAGNOSIS — Z0000 Encounter for general adult medical examination without abnormal findings: Secondary | ICD-10-CM

## 2022-07-25 DIAGNOSIS — Z6836 Body mass index (BMI) 36.0-36.9, adult: Secondary | ICD-10-CM | POA: Diagnosis not present

## 2022-07-25 DIAGNOSIS — Z803 Family history of malignant neoplasm of breast: Secondary | ICD-10-CM | POA: Insufficient documentation

## 2022-07-25 DIAGNOSIS — Z13220 Encounter for screening for lipoid disorders: Secondary | ICD-10-CM

## 2022-07-25 DIAGNOSIS — Z3041 Encounter for surveillance of contraceptive pills: Secondary | ICD-10-CM

## 2022-07-25 NOTE — Patient Instructions (Signed)
I value your feedback and you entrusting us with your care. If you get a [facility] patient survey, I would appreciate you taking the time to let us know about your experience today. Thank you! ? ? ?

## 2022-07-26 LAB — TIQ-NTM

## 2022-07-26 LAB — GASTROINTESTINAL PATHOGEN PNL
CampyloBacter Group: NOT DETECTED
Norovirus GI/GII: NOT DETECTED
Rotavirus A: NOT DETECTED
Salmonella species: NOT DETECTED
Shiga Toxin 1: NOT DETECTED
Shiga Toxin 2: NOT DETECTED
Shigella Species: NOT DETECTED
Vibrio Group: NOT DETECTED
Yersinia enterocolitica: NOT DETECTED

## 2022-07-26 LAB — COMPREHENSIVE METABOLIC PANEL
ALT: 34 IU/L — ABNORMAL HIGH (ref 0–32)
AST: 15 IU/L (ref 0–40)
Albumin/Globulin Ratio: 1.9 (ref 1.2–2.2)
Albumin: 3.7 g/dL — ABNORMAL LOW (ref 3.9–4.9)
Alkaline Phosphatase: 77 IU/L (ref 44–121)
BUN/Creatinine Ratio: 13 (ref 9–23)
BUN: 12 mg/dL (ref 6–24)
Bilirubin Total: <0.2 mg/dL (ref 0.0–1.2)
CO2: 21 mmol/L (ref 20–29)
Calcium: 8.9 mg/dL (ref 8.7–10.2)
Chloride: 104 mmol/L (ref 96–106)
Creatinine, Ser: 0.89 mg/dL (ref 0.57–1.00)
Globulin, Total: 2 g/dL (ref 1.5–4.5)
Glucose: 104 mg/dL — ABNORMAL HIGH (ref 70–99)
Potassium: 3.7 mmol/L (ref 3.5–5.2)
Sodium: 140 mmol/L (ref 134–144)
Total Protein: 5.7 g/dL — ABNORMAL LOW (ref 6.0–8.5)
eGFR: 83 mL/min/{1.73_m2} (ref >59)

## 2022-07-26 LAB — HEMOGLOBIN A1C
Est. average glucose Bld gHb Est-mCnc: 105 mg/dL
Hgb A1c MFr Bld: 5.3 % (ref 4.8–5.6)

## 2022-07-26 LAB — LIPID PANEL
Chol/HDL Ratio: 3.1 ratio (ref 0.0–4.4)
Cholesterol, Total: 109 mg/dL (ref 100–199)
HDL: 35 mg/dL — ABNORMAL LOW (ref >39)
LDL Chol Calc (NIH): 58 mg/dL (ref 0–99)
Triglycerides: 76 mg/dL (ref 0–149)
VLDL Cholesterol Cal: 16 mg/dL (ref 5–40)

## 2022-07-26 LAB — T4, FREE: Free T4: 1.04 ng/dL (ref 0.82–1.77)

## 2022-07-26 LAB — TSH: TSH: 1.28 u[IU]/mL (ref 0.450–4.500)

## 2022-07-27 LAB — SURGICAL PATHOLOGY

## 2022-07-31 ENCOUNTER — Other Ambulatory Visit: Payer: Self-pay

## 2022-07-31 DIAGNOSIS — R197 Diarrhea, unspecified: Secondary | ICD-10-CM

## 2022-07-31 DIAGNOSIS — K6289 Other specified diseases of anus and rectum: Secondary | ICD-10-CM

## 2022-08-01 LAB — CYTOLOGY - PAP
Comment: NEGATIVE
Diagnosis: NEGATIVE
High risk HPV: NEGATIVE

## 2022-08-10 ENCOUNTER — Other Ambulatory Visit: Payer: BC Managed Care – PPO

## 2022-08-10 ENCOUNTER — Encounter: Payer: Self-pay | Admitting: Family Medicine

## 2022-08-10 ENCOUNTER — Ambulatory Visit: Payer: BC Managed Care – PPO

## 2022-08-10 DIAGNOSIS — N921 Excessive and frequent menstruation with irregular cycle: Secondary | ICD-10-CM

## 2022-08-13 ENCOUNTER — Other Ambulatory Visit: Payer: Self-pay

## 2022-08-15 ENCOUNTER — Telehealth: Payer: Self-pay

## 2022-08-15 NOTE — Telephone Encounter (Signed)
Tysabri start form signed by Dr. Felecia Shelling. Faxed to [facility]. Received a receipt of confirmation.  Tysabri order signed by Dr. Felecia Shelling. Given to infusion suite for processing. Patient should not start until November.

## 2022-08-23 ENCOUNTER — Other Ambulatory Visit: Payer: Self-pay | Admitting: Family Medicine

## 2022-08-23 DIAGNOSIS — F411 Generalized anxiety disorder: Secondary | ICD-10-CM

## 2022-08-24 ENCOUNTER — Ambulatory Visit (INDEPENDENT_AMBULATORY_CARE_PROVIDER_SITE_OTHER): Payer: BC Managed Care – PPO

## 2022-08-24 ENCOUNTER — Telehealth: Payer: Self-pay

## 2022-08-24 ENCOUNTER — Other Ambulatory Visit: Payer: Self-pay

## 2022-08-24 DIAGNOSIS — N921 Excessive and frequent menstruation with irregular cycle: Secondary | ICD-10-CM | POA: Diagnosis not present

## 2022-08-24 DIAGNOSIS — I10 Essential (primary) hypertension: Secondary | ICD-10-CM

## 2022-08-24 MED ORDER — ATENOLOL 50 MG PO TABS: 50.0000 mg | ORAL_TABLET | Freq: Every day | ORAL | 11 refills | Status: DC

## 2022-08-24 NOTE — Telephone Encounter (Signed)
Pharmacy faxed a refill or prescription renewal for: atenolol (TENORMIN) 50 MG tablet [[id number]]    Order Details Dose, Route, Frequency: As Directed  Dispense Quantity: 30 tablet Refills: 11        Sig: TAKE 1 TABLET BY MOUTH EVERY DAY  Patient taking differently: Take 50 mg by mouth at bedtime.       Start Date: 08/11/21 End Date: --  Written Date: 08/11/21 Expiration Date: 08/11/22      LOV: 01/23/22    PHARMACY: [id number] - [place], [place] - [address]

## 2022-08-28 ENCOUNTER — Other Ambulatory Visit: Payer: Self-pay | Admitting: Obstetrics and Gynecology

## 2022-09-01 ENCOUNTER — Other Ambulatory Visit: Payer: Self-pay | Admitting: Family Medicine

## 2022-09-01 NOTE — Telephone Encounter (Signed)
Requested medication (s) are due for refill today: unclear looks like  could have one more fill  Requested medication (s) are on the active medication list: yes  Last refill:  02/06/22 #90 with 2 RF  Future visit scheduled: no, has been seen multiple times for different things that are not related to this med.  Notes to clinic:  please assess.      Requested Prescriptions  Pending Prescriptions Disp Refills   DULoxetine (CYMBALTA) 60 MG capsule [Pharmacy Med Name: DULOXETINE HCL DR 60 MG CAP] 90 capsule 2    Sig: TAKE 1 CAPSULE BY MOUTH EVERY DAY     Psychiatry: Antidepressants - SNRI - duloxetine Failed - 09/01/2022  1:53 AM      Failed - Valid encounter within last 6 months    Recent Outpatient Visits           11 months ago Pelvic pain   [facility] Pickard, Warren T, MD   1 year ago Viral upper respiratory tract infection   [facility] Pickard, Warren T, MD   1 year ago Elevated serum creatinine   [facility] Pickard, Warren T, MD   1 year ago Cervical radiculopathy   [facility] Pickard, Warren T, MD   1 year ago Diarrhea, unspecified type   [facility] Martinez, Jessica A, NP              Passed - Cr in normal range and within 360 days    Creat  Date Value Ref Range Status  10/03/2021 0.91 0.50 - 0.99 mg/dL Final   Creatinine, Ser  Date Value Ref Range Status  07/25/2022 0.89 0.57 - 1.00 mg/dL Final   Creatinine, Urine  Date Value Ref Range Status  07/26/2021 43 20 - 275 mg/dL Final         Passed - eGFR is 30 or above and within 360 days    GFR, Est African American  Date Value Ref Range Status  08/24/2020 93 > OR = 60 mL/min/1.73m2 Final   GFR, Est Non African American  Date Value Ref Range Status  08/24/2020 80 > OR = 60 mL/min/1.73m2 Final   GFR, Estimated  Date Value Ref Range Status  09/27/2021 >60 >60 mL/min Final    Comment:    (NOTE) Calculated using  the CKD-EPI Creatinine Equation (2021)    eGFR  Date Value Ref Range Status  07/25/2022 83 >59 mL/min/1.73 Final         Passed - Completed PHQ-2 or PHQ-9 in the last 360 days      Passed - Last BP in normal range    BP Readings from Last 1 Encounters:  07/25/22 100/70           

## 2022-09-05 DIAGNOSIS — G35 Multiple sclerosis: Secondary | ICD-10-CM | POA: Diagnosis not present

## 2022-09-07 ENCOUNTER — Other Ambulatory Visit: Payer: Self-pay | Admitting: Neurology

## 2022-09-11 NOTE — Telephone Encounter (Signed)
Patient's tysabri infusion was 09/05/2022.

## 2022-09-23 ENCOUNTER — Other Ambulatory Visit: Payer: Self-pay | Admitting: Family Medicine

## 2022-09-23 DIAGNOSIS — F411 Generalized anxiety disorder: Secondary | ICD-10-CM

## 2022-09-28 ENCOUNTER — Ambulatory Visit: Payer: BC Managed Care – PPO | Admitting: Family Medicine

## 2022-10-03 ENCOUNTER — Telehealth: Payer: Self-pay | Admitting: Family Medicine

## 2022-10-10 ENCOUNTER — Encounter: Payer: Self-pay | Admitting: Gastroenterology

## 2022-10-10 ENCOUNTER — Other Ambulatory Visit: Payer: Self-pay

## 2022-10-10 ENCOUNTER — Ambulatory Visit (INDEPENDENT_AMBULATORY_CARE_PROVIDER_SITE_OTHER): Payer: BC Managed Care – PPO | Admitting: Gastroenterology

## 2022-10-10 VITALS — BP 112/82 | HR 85 | Ht 64.0 in | Wt 189.0 lb

## 2022-10-10 DIAGNOSIS — K529 Noninfective gastroenteritis and colitis, unspecified: Secondary | ICD-10-CM

## 2022-10-10 DIAGNOSIS — K625 Hemorrhage of anus and rectum: Secondary | ICD-10-CM | POA: Diagnosis not present

## 2022-10-10 DIAGNOSIS — K219 Gastro-esophageal reflux disease without esophagitis: Secondary | ICD-10-CM

## 2022-10-10 DIAGNOSIS — R634 Abnormal weight loss: Secondary | ICD-10-CM

## 2022-10-10 DIAGNOSIS — K602 Anal fissure, unspecified: Secondary | ICD-10-CM

## 2022-10-10 MED ORDER — HYOSCYAMINE SULFATE 0.125 MG SL SUBL: 0.1250 mg | SUBLINGUAL_TABLET | Freq: Four times a day (QID) | SUBLINGUAL | 2 refills | Status: DC | PRN

## 2022-10-10 NOTE — Progress Notes (Addendum)
[facility] Consult Note:  History: Virginia [name] 10/10/2022  Referring provider: [name], MD  Reason for consult/chief complaint: Diarrhea (Pt is here because of diarrhea due to malabsorption)   Subjective  HPI: Virginia [name] was seen here in September 2020 for epigastric pain and nausea, upper endoscopy performed.  Normal study, gastric biopsies normal, soft show biopsies with mild increase in eosinophils. Subsequent CTAP showing nonspecific finding of some retained contrast in distal esophagus. Seen here August 2022 for months of symptoms (pain and bleeding) from a posterior anal fissure.  Primary care note from September reviewed, patient was evaluated for persistent anorectal pain and bleeding as well as diarrhea, reportedly seeing undigested food in loose stools.  Dr. Tanya Nones noted patient's reported increased stress due to domestic issues.  He had concerns about nonhealing fissure, possible IBD, stool studies ordered and referred here for consideration of colonoscopy.  (Stool studies negative for multiple bacteria and viruses, though no check for C. difficile)  Virginia [name] has been troubled by diarrhea since about July, and she recalls them coming on gradually.  She has little abdominal pain, but sometimes feels bloating and cramps before BM.  She will have a BM within 10 to 15 minutes after eating, and sees undigested food particles that she feels may be what she just ate.  She has thus decreased her food intake, and has lost 40 to 50 pounds in the last several months.  She also has pain and bleeding with bowel movements that has been attributed to fissures, no relief with nitroglycerin ointment.  No known family history of Crohn's, UC or celiac. No travel, antibiotic use or recollection of sick contacts prior to onset of the symptoms.  ROS:  Review of Systems  Constitutional:  Negative for appetite change and unexpected weight change.  HENT:  Negative for  mouth sores and voice change.   Eyes:  Negative for pain and redness.  Respiratory:  Negative for cough and shortness of breath.   Cardiovascular:  Negative for chest pain and palpitations.  Genitourinary:  Negative for dysuria and hematuria.  Musculoskeletal:  Negative for arthralgias and myalgias.  Skin:  Negative for pallor and rash.  Neurological:  Positive for headaches. Negative for weakness.  Hematological:  Negative for adenopathy.  Psychiatric/Behavioral:         Anxiety   He does admit to having been under significant stress the second half of this year because of a separation from her husband.   Past Medical History: Past Medical History:  Diagnosis Date   Anxiety    GERD (gastroesophageal reflux disease)    Headache    Insomnia    Multiple sclerosis (HCC)    dx 2017   Neuromuscular disorder (HCC)    Phreesia 06/30/2020   Nystagmus 02/15/2017   PAC (premature atrial contraction)    PVC (premature ventricular contraction)    Syncope and collapse    Tachycardia      Past Surgical History: Past Surgical History:  Procedure Laterality Date   LEEP N/A 09/20/2021   Procedure: LOOP ELECTROSURGICAL EXCISION PROCEDURE (LEEP);  Surgeon: [name], MD;  Location: [facility] ORS;  Service: Gynecology;  Laterality: N/A;     Family History: Family History  Problem Relation Age of Onset   Emphysema Mother    Osteoarthritis Mother    Anxiety disorder Mother    Uterine cancer Mother 44   Other Father    Suicidality Father    Breast cancer Maternal Aunt    Breast  cancer Maternal Aunt    Breast cancer Maternal Aunt    ALS Maternal Grandmother    Colon cancer Neg Hx    Colon polyps Neg Hx    Esophageal cancer Neg Hx    Rectal cancer Neg Hx    Stomach cancer Neg Hx     Social History: Social History   Socioeconomic History   Marital status: Married    Spouse name: Not on file   Number of children: Not on file   Years of education: Not on file   Highest  education level: Not on file  Occupational History   Not on file  Tobacco Use   Smoking status: Former    Packs/day: 0.50    Years: 10.00    Total pack years: 5.00    Types: Cigarettes    Quit date: 2015    Years since quitting: 8.9   Smokeless tobacco: Never  Vaping Use   Vaping Use: Every day   Substances: Nicotine, Flavoring  Substance and Sexual Activity   Alcohol use: Yes    Alcohol/week: 0.0 standard drinks of alcohol    Comment: rarely   Drug use: No   Sexual activity: Not Currently    Birth control/protection: Pill  Other Topics Concern   Not on file  Social History Narrative   Not on file   Social Determinants of Health   Financial Resource Strain: Not on file  Food Insecurity: Not on file  Transportation Needs: Not on file  Physical Activity: Not on file  Stress: Not on file  Social Connections: Not on file    Allergies: Allergies  Allergen Reactions   Codeine Other (See Comments)    Hallucinations    Onion     swelling   Topamax [Topiramate]     Was talking out of her head, walked into walls    Outpatient Meds: Current Outpatient Medications  Medication Sig Dispense Refill   AIMOVIG 140 MG/ML SOAJ INJECT 140 MG INTO THE SKIN EVERY 30 (THIRTY) DAYS 1 mL 5   amitriptyline (ELAVIL) 50 MG tablet TAKE 1 TABLET BY MOUTH EVERY DAY AT BEDTIME AS NEEDED FOR SLEEP 90 tablet 3   atenolol (TENORMIN) 50 MG tablet Take 1 tablet (50 mg total) by mouth daily. 30 tablet 11   baclofen (LIORESAL) 10 MG tablet TAKE 2TABLETS BY MOUTH 3 TIMES A DAY FOR SPASTICITY 540 tablet 3   clonazePAM (KLONOPIN) 1 MG tablet TAKE 1 TABLET BY MOUTH TWICE A DAY AS NEEDED FOR ANXIETY 20 tablet 1   diltiazem 2 % GEL Apply 1 Application topically 3 (three) times daily. 60 g 1   DULoxetine (CYMBALTA) 60 MG capsule TAKE 1 CAPSULE BY MOUTH EVERY DAY 90 capsule 2   etodolac (LODINE) 400 MG tablet Take 1 tablet (400 mg total) by mouth 2 (two) times daily. 60 tablet 5   gabapentin (NEURONTIN)  600 MG tablet Take 1 tablet (600 mg total) by mouth 4 (four) times daily. 120 tablet 11   HAILEY 24 FE 1-20 MG-MCG(24) tablet TAKE 1 TABLET BY MOUTH EVERY DAY 84 tablet 3   hydrocortisone 2.5 % cream Apply topically 2 (two) times daily. 30 g 0   hyoscyamine (LEVSIN SL) 0.125 MG SL tablet Place 1 tablet (0.125 mg total) under the tongue every 6 (six) hours as needed. 30 tablet 2   meclizine (ANTIVERT) 25 MG tablet Take 1 tablet (25 mg total) by mouth 3 (three) times daily as needed for dizziness. 30 tablet 11  Ofatumumab (KESIMPTA) 20 MG/0.4ML SOAJ Inject 0.4 mLs into the skin every 30 (thirty) days. 20mg  at week 0, 1, 2. 20mg  once monthly starting at week 4. 1.2 mL 3   pantoprazole (PROTONIX) 40 MG tablet TAKE 1 TABLET BY MOUTH TWICE A DAY 180 tablet 1   pramoxine-hydrocortisone cream Apply topically 3 (three) times daily. 57 g 0   sucralfate (CARAFATE) 1 g tablet TAKE 1 TABLET BY MOUTH 4 TIMES DAILY - WITH MEALS AND AT BEDTIME. (Patient taking differently: as needed.) 120 tablet 1   SUMAtriptan (IMITREX) 50 MG tablet TAKE 1 TABLET EVERY 2 HRS AS NEEDED FOR MIGRAINE. MAY REPEAT IN 2 HRS IF HEADACHE PERSISTS OR RECURS 10 tablet 5   temazepam (RESTORIL) 30 MG capsule TAKE 1 CAPSULE AT BEDTIME AS NEEDED FOR SLEEP (DO NOT TAKE WITH KLONOPIN). 15 capsule 3   No current facility-administered medications for this visit.      ___________________________________________________________________ Objective   Exam:  BP 112/82   Pulse 85   Ht 5\' 4"  (1.626 m)   Wt 189 lb (85.7 kg)   BMI 32.44 kg/m  Wt Readings from Last 3 Encounters:  10/10/22 189 lb (85.7 kg)  07/25/22 212 lb (96.2 kg)  07/21/22 206 lb 6.4 oz (93.6 kg)   She was 231 pounds at a PCP visit in July Exam chaperoned by 07/27/22, MA General: Well-appearing, no muscle wasting Eyes: sclera anicteric, no redness ENT: oral mucosa moist without lesions, no cervical or supraclavicular lymphadenopathy CV: Regular without murmur, no JVD,  no peripheral edema Resp: clear to auscultation bilaterally, normal RR and effort noted GI: soft, no tenderness, with active bowel sounds. No guarding or palpable organomegaly noted.  No bruit Skin; warm and dry, no rash or jaundice noted Neuro: awake, alert and oriented x 3. Normal gross motor function and fluent speech Perianal exam with skin tags, there is a very small superficial posterior skin tear between skin tags.  DRE without palpable fissure or tenderness or appreciable internal lesion.  No blood on glove. Labs:  Lab Results  Component Value Date   TSH 1.280 07/25/2022      Latest Ref Rng & Units 06/27/2022    4:23 PM 03/22/2022    1:47 PM 10/03/2021    4:45 PM  CBC  WBC 3.4 - 10.8 x10E3/uL 6.9  9.2  6.9   Hemoglobin 11.1 - 15.9 g/dL 06/29/2022  03/24/2022  14/02/2021   Hematocrit 34.0 - 46.6 % 43.2  41.8  40.3   Platelets 150 - 450 x10E3/uL 345  348  306       Latest Ref Rng & Units 07/25/2022    3:58 PM 03/22/2022    1:47 PM 10/03/2021    4:45 PM  CMP  Glucose 70 - 99 mg/dL 07/27/2022   03/24/2022   BUN 6 - 24 mg/dL 12   12   Creatinine 14/02/2021 - 1.00 mg/dL 092   330   Sodium 0.76 - 144 mmol/L 140   141   Potassium 3.5 - 5.2 mmol/L 3.7   3.7   Chloride 96 - 106 mmol/L 104   108   CO2 20 - 29 mmol/L 21   23   Calcium 8.7 - 10.2 mg/dL 8.9   8.6   Total Protein 6.0 - 8.5 g/dL 5.7  6.6  6.2   Total Bilirubin 0.0 - 1.2 mg/dL 2.26  3.33  0.3   Alkaline Phos 44 - 121 IU/L 77  96    AST 0 -  40 IU/L 15  10  13    ALT 0 - 32 IU/L 34  16  14    No celiac labs  Radiologic Studies: No abdominal imaging  Assessment: Encounter Diagnoses  Name Primary?   Chronic diarrhea Yes   Weight loss    Rectal bleeding     Chronic diarrhea with weight loss, raises concern for malabsorptive condition.  Consider celiac sprue, EPI (though no apparent risk factors other than prior smoking), IBD. Weight loss is significant and certainly worrisome, more than 1 would expect from food avoidance due to chronic diarrhea.   Smoking history, though no chronic cough, dyspnea or hemoptysis.  She has been treated for fissures, the does not appear to have that on exam today. Perhaps symptomatic hemorrhoids exacerbated by diarrhea.  Plan: C diff swab collected and sent at today's visit  EGD/colonoscopy for evaluation and extensive biopsies.  She was agreeable after discussion of procedure and risks.  The benefits and risks of the planned procedure were described in detail with the patient or (when appropriate) their health care proxy.  Risks were outlined as including, but not limited to, bleeding, infection, perforation, adverse medication reaction leading to cardiac or pulmonary decompensation, pancreatitis (if ERCP).  The limitation of incomplete mucosal visualization was also discussed.  No guarantees or warranties were given.    CT chest/Abd/pelvis with contrast  Levsin as needed for intestinal cramps or diarrhea  Thank you for the courtesy of this consult.  Please call me with any questions or concerns.  Charlie Pitter III  CC: Referring provider noted above

## 2022-10-10 NOTE — Patient Instructions (Signed)
_______________________________________________________  If you are age 42 or older, your body mass index should be between 23-30. Your Body mass index is 32.44 kg/m. If this is out of the aforementioned range listed, please consider follow up with your Primary Care Provider.  If you are age 32 or younger, your body mass index should be between 19-25. Your Body mass index is 32.44 kg/m. If this is out of the aformentioned range listed, please consider follow up with your Primary Care Provider.   ________________________________________________________  The [facility] providers would like to encourage you to use [facility] to communicate with providers for non-urgent requests or questions.  Due to long hold times on the telephone, sending your provider a message by [facility] may be a faster and more efficient way to get a response.  Please allow 48 business hours for a response.  Please remember that this is for non-urgent requests.  _______________________________________________________  Your provider has ordered "Diatherix" stool testing for you. You have received a kit from our office today containing all necessary supplies to complete this test. Please carefully read the stool collection instructions provided in the kit before opening the accompanying materials. In addition, be sure to place the label from the top left corner of the laboratory request sheet onto the "puritan opti-swab" tube that is supplied in the kit. This label should include your full name and date of birth. After completing the test, you should secure the purtian tube into the specimen biohazard bag. The laboratory request information sheet (including date and time of specimen collection) should be placed into the outside pocket of the specimen biohazard bag and returned to the [facility] lab with 2 days of collection.     You have been scheduled for a CT scan of the abdomen and pelvis at [facility], 1st floor Radiology.  You are scheduled on 10-19-2022 at [place] should arrive 2 hours prior to your appointment time for registration.  We are giving you 2 bottles of contrast today that you will need to drink before arriving for the exam. The solution may taste better if refrigerated so put them in the refrigerator when you get home, but do NOT add ice or any other liquid to this solution as that would dilute it. Shake well before drinking.   Please follow the written instructions below on the day of your exam:   1) Do not eat anything after 1pm (4 hours prior to your test)   You may take any medications as prescribed with a small amount of water, if necessary. If you take any of the following medications: METFORMIN, GLUCOPHAGE, GLUCOVANCE, AVANDAMET, RIOMET, FORTAMET, [place] MET, JANUMET, GLUMETZA or METAGLIP, you MAY be asked to HOLD this medication 48 hours AFTER the exam.   The purpose of you drinking the oral contrast is to aid in the visualization of your intestinal tract. The contrast solution may cause some diarrhea. Depending on your individual set of symptoms, you may also receive an intravenous injection of x-ray contrast/dye. Plan on being at [facility] for 45 minutes or longer, depending on the type of exam you are having performed.   If you have any questions regarding your exam or if you need to reschedule, you may call [facility] at [phone] between the hours of 8:00 am and 5:00 pm, Monday-Friday.   It was a pleasure to see you today!  Thank you for trusting me with your gastrointestinal care!

## 2022-10-11 ENCOUNTER — Other Ambulatory Visit: Payer: Self-pay | Admitting: Family Medicine

## 2022-10-11 DIAGNOSIS — G35 Multiple sclerosis: Secondary | ICD-10-CM | POA: Diagnosis not present

## 2022-10-11 NOTE — Telephone Encounter (Signed)
Rx was sent to same pharmacy on 09/01/22 # 90/2  Requested Prescriptions  Pending Prescriptions Disp Refills   DULoxetine (CYMBALTA) 60 MG capsule      Sig: TAKE 1 CAPSULE BY MOUTH EVERY DAY     Psychiatry: Antidepressants - SNRI - duloxetine Failed - 10/11/2022  5:08 PM      Failed - Valid encounter within last 6 months    Recent Outpatient Visits           1 year ago Pelvic pain   [facility] Susy Frizzle, MD   1 year ago Viral upper respiratory tract infection   [facility] Susy Frizzle, MD   1 year ago Elevated serum creatinine   [place] Dennard Schaumann, Cammie Mcgee, MD   1 year ago Cervical radiculopathy   [place] Susy Frizzle, MD   1 year ago Diarrhea, unspecified type   [place] Eulogio Bear, NP              Passed - Cr in normal range and within 360 days    Creat  Date Value Ref Range Status  10/03/2021 0.91 0.50 - 0.99 mg/dL Final   Creatinine, Ser  Date Value Ref Range Status  07/25/2022 0.89 0.57 - 1.00 mg/dL Final   Creatinine, Urine  Date Value Ref Range Status  07/26/2021 43 20 - 275 mg/dL Final         Passed - eGFR is 30 or above and within 360 days    GFR, Est African American  Date Value Ref Range Status  08/24/2020 93 > OR = 60 mL/min/1.7m Final   GFR, Est Non African American  Date Value Ref Range Status  08/24/2020 80 > OR = 60 mL/min/1.730mFinal   GFR, Estimated  Date Value Ref Range Status  09/27/2021 >60 >60 mL/min Final    Comment:    (NOTE) Calculated using the CKD-EPI Creatinine Equation (2021)    eGFR  Date Value Ref Range Status  07/25/2022 83 >59 mL/min/1.73 Final         Passed - Completed PHQ-2 or PHQ-9 in the last 360 days      Passed - Last BP in normal range    BP Readings from Last 1 Encounters:  10/10/22 112/82

## 2022-10-11 NOTE — Telephone Encounter (Signed)
Prescription Request  10/11/2022  Is this a "Controlled Substance" medicine? No  LOV: 07/21/2022  What is the name of the medication or equipment? Duloxetine hcl dr 60 mg cap 90 day supply  Have you contacted your pharmacy to request a refill? Yes   Which pharmacy would you like this sent to?  [id number] [place] [place] - [address] [facility] [address] [place] [address] Phone: [phone] Fax: [phone]    Patient notified that their request is being sent to the clinical staff for review and that they should receive a response within 2 business days.   Please advise at [facility] [phone]

## 2022-10-12 ENCOUNTER — Encounter: Payer: Self-pay | Admitting: Gastroenterology

## 2022-10-19 ENCOUNTER — Ambulatory Visit (HOSPITAL_COMMUNITY)
Admission: RE | Admit: 2022-10-19 | Discharge: 2022-10-19 | Disposition: A | Discharge status: Home / Self Care | Payer: BC Managed Care – PPO | Source: Ambulatory Visit | Attending: Gastroenterology | Admitting: Gastroenterology

## 2022-10-19 DIAGNOSIS — K625 Hemorrhage of anus and rectum: Secondary | ICD-10-CM | POA: Diagnosis not present

## 2022-10-19 DIAGNOSIS — R634 Abnormal weight loss: Secondary | ICD-10-CM | POA: Insufficient documentation

## 2022-10-19 DIAGNOSIS — K529 Noninfective gastroenteritis and colitis, unspecified: Secondary | ICD-10-CM | POA: Insufficient documentation

## 2022-10-19 DIAGNOSIS — N281 Cyst of kidney, acquired: Secondary | ICD-10-CM | POA: Diagnosis not present

## 2022-10-19 MED ORDER — IOHEXOL 300 MG/ML  SOLN
100.0000 mL | Freq: Once | INTRAMUSCULAR | Status: AC | PRN
  Administered 2022-10-19: 100 mL via INTRAVENOUS

## 2022-11-05 ENCOUNTER — Other Ambulatory Visit: Payer: Self-pay | Admitting: Family Medicine

## 2022-11-05 DIAGNOSIS — F411 Generalized anxiety disorder: Secondary | ICD-10-CM

## 2022-11-08 ENCOUNTER — Encounter: Payer: Self-pay | Admitting: Gastroenterology

## 2022-11-13 ENCOUNTER — Encounter: Payer: Self-pay | Admitting: Gastroenterology

## 2022-11-13 MED ORDER — NA SULFATE-K SULFATE-MG SULF 17.5-3.13-1.6 GM/177ML PO SOLN: 1.0000 | Freq: Once | ORAL | 0 refills | Status: AC

## 2022-11-14 ENCOUNTER — Encounter: Payer: Self-pay | Admitting: Certified Registered Nurse Anesthetist

## 2022-11-15 ENCOUNTER — Ambulatory Visit (AMBULATORY_SURGERY_CENTER): Payer: BC Managed Care – PPO | Admitting: Gastroenterology

## 2022-11-15 ENCOUNTER — Encounter: Payer: Self-pay | Admitting: Gastroenterology

## 2022-11-15 VITALS — BP 116/79 | HR 66 | Temp 98.2°F | Resp 12 | Ht 64.0 in | Wt 189.0 lb

## 2022-11-15 DIAGNOSIS — R197 Diarrhea, unspecified: Secondary | ICD-10-CM

## 2022-11-15 DIAGNOSIS — R634 Abnormal weight loss: Secondary | ICD-10-CM

## 2022-11-15 DIAGNOSIS — K529 Noninfective gastroenteritis and colitis, unspecified: Secondary | ICD-10-CM | POA: Diagnosis not present

## 2022-11-15 HISTORY — PX: COLONOSCOPY WITH ESOPHAGOGASTRODUODENOSCOPY (EGD): SHX5779

## 2022-11-15 MED ORDER — SODIUM CHLORIDE 0.9 % IV SOLN: 500.0000 mL | Freq: Once | INTRAVENOUS | Status: DC

## 2022-11-15 NOTE — Op Note (Signed)
[facility] Patient Name: Virginia [name] Procedure Date: 11/15/2022 3:09 PM MRN: [id number] Endoscopist: [place]. Loletha Carrow , MD, [id number] Age: 42 Referring MD:  Date of Birth: 11/10/1980 Gender: Female Account #: [id number] Procedure:                Upper GI endoscopy Indications:              Diarrhea, Weight loss Medicines:                Monitored Anesthesia Care Procedure:                Pre-Anesthesia Assessment:                           - Prior to the procedure, a History and Physical                            was performed, and patient medications and                            allergies were reviewed. The patient's tolerance of                            previous anesthesia was also reviewed. The risks                            and benefits of the procedure and the sedation                            options and risks were discussed with the patient.                            All questions were answered, and informed consent                            was obtained. Prior Anticoagulants: The patient has                            taken no anticoagulant or antiplatelet agents. ASA                            Grade Assessment: II - A patient with mild systemic                            disease. After reviewing the risks and benefits,                            the patient was deemed in satisfactory condition to                            undergo the procedure.                           After obtaining informed consent, the endoscope was  passed under direct vision. Throughout the                            procedure, the patient's blood pressure, pulse, and                            oxygen saturations were monitored continuously. The                            GIF [id number] #[id number] was introduced through the                            mouth, and advanced to the second part of duodenum.                            The upper GI endoscopy was  accomplished without                            difficulty. The patient tolerated the procedure                            well. Scope In: Scope Out: Findings:                 The larynx was normal.                           The esophagus was normal.                           The stomach was normal.                           The cardia and gastric fundus were normal on                            retroflexion.                           Normal mucosa was found in the entire duodenum.                            Biopsies for histology were taken with a cold                            forceps for evaluation of celiac disease. Complications:            No immediate complications. Estimated Blood Loss:     Estimated blood loss was minimal. Impression:               - Normal larynx.                           - Normal esophagus.                           - Normal stomach.                           -  Normal mucosa was found in the entire examined                            duodenum. Biopsied. Recommendation:           - Patient has a contact number available for                            emergencies. The signs and symptoms of potential                            delayed complications were discussed with the                            patient. Return to normal activities tomorrow.                            Written discharge instructions were provided to the                            patient.                           - Resume previous diet.                           - Continue present medications.                           - Await pathology results.                           - See the other procedure note for documentation of                            additional recommendations.                           - Return to my office at appointment to be                            scheduled. Emely Fahy L. Loletha Carrow, MD 11/15/2022 3:48:50 PM This report has been signed electronically.

## 2022-11-15 NOTE — Progress Notes (Signed)
Report given to PACU, vss 

## 2022-11-15 NOTE — Progress Notes (Signed)
History and Physical:  This patient presents for endoscopic testing for: Encounter Diagnoses  Name Primary?   Chronic diarrhea Yes   Weight loss     Clinical details in 10/10/22 office consult note  Subsequent normal CT Chest,A/P  Patient is otherwise without complaints or active issues today.   Past Medical History: Past Medical History:  Diagnosis Date   Anxiety    GERD (gastroesophageal reflux disease)    Headache    Insomnia    Multiple sclerosis ([place])    dx 2017   Neuromuscular disorder ([place])    Phreesia 06/30/2020   Nystagmus 02/15/2017   PAC (premature atrial contraction)    PVC (premature ventricular contraction)    Syncope and collapse    Tachycardia      Past Surgical History: Past Surgical History:  Procedure Laterality Date   LEEP N/A 09/20/2021   Procedure: LOOP ELECTROSURGICAL EXCISION PROCEDURE (LEEP);  Surgeon: Gae Dry, MD;  Location: [facility] ORS;  Service: Gynecology;  Laterality: N/A;    Allergies: Allergies  Allergen Reactions   Codeine Other (See Comments)    Hallucinations    Onion     swelling   Topamax [Topiramate]     Was talking out of her head, walked into walls    Outpatient Meds: Current Outpatient Medications  Medication Sig Dispense Refill   amitriptyline (ELAVIL) 50 MG tablet TAKE 1 TABLET BY MOUTH EVERY DAY AT BEDTIME AS NEEDED FOR SLEEP 90 tablet 3   atenolol (TENORMIN) 50 MG tablet Take 1 tablet (50 mg total) by mouth daily. 30 tablet 11   baclofen (LIORESAL) 10 MG tablet TAKE 2TABLETS BY MOUTH 3 TIMES A DAY FOR SPASTICITY 540 tablet 3   clonazePAM (KLONOPIN) 1 MG tablet TAKE 1 TABLET BY MOUTH TWICE A DAY AS NEEDED FOR ANXIETY 20 tablet 1   DULoxetine (CYMBALTA) 60 MG capsule TAKE 1 CAPSULE BY MOUTH EVERY DAY 90 capsule 2   etodolac (LODINE) 400 MG tablet Take 1 tablet (400 mg total) by mouth 2 (two) times daily. 60 tablet 5   gabapentin (NEURONTIN) 600 MG tablet Take 1 tablet (600 mg total) by mouth 4 (four) times  daily. 120 tablet 11   HAILEY 24 FE 1-20 MG-MCG(24) tablet TAKE 1 TABLET BY MOUTH EVERY DAY 84 tablet 3   hyoscyamine (LEVSIN SL) 0.125 MG SL tablet Place 1 tablet (0.125 mg total) under the tongue every 6 (six) hours as needed. 30 tablet 2   pantoprazole (PROTONIX) 40 MG tablet TAKE 1 TABLET BY MOUTH TWICE A DAY 180 tablet 1   SUMAtriptan (IMITREX) 50 MG tablet TAKE 1 TABLET EVERY 2 HRS AS NEEDED FOR MIGRAINE. MAY REPEAT IN 2 HRS IF HEADACHE PERSISTS OR RECURS 10 tablet 5   temazepam (RESTORIL) 30 MG capsule TAKE 1 CAPSULE AT BEDTIME AS NEEDED FOR SLEEP (DO NOT TAKE WITH KLONOPIN). 15 capsule 3   AIMOVIG 140 MG/ML SOAJ INJECT 140 MG INTO THE SKIN EVERY 30 (THIRTY) DAYS 1 mL 5   diltiazem 2 % GEL Apply 1 Application topically 3 (three) times daily. 60 g 1   hydrocortisone 2.5 % cream Apply topically 2 (two) times daily. 30 g 0   meclizine (ANTIVERT) 25 MG tablet Take 1 tablet (25 mg total) by mouth 3 (three) times daily as needed for dizziness. 30 tablet 11   Ofatumumab (KESIMPTA) 20 MG/0.4ML SOAJ Inject 0.4 mLs into the skin every 30 (thirty) days. 20mg  at week 0, 1, 2. 20mg  once monthly starting at week 4. (Patient  not taking: Reported on 11/15/2022) 1.2 mL 3   pramoxine-hydrocortisone cream Apply topically 3 (three) times daily. 57 g 0   sucralfate (CARAFATE) 1 g tablet TAKE 1 TABLET BY MOUTH 4 TIMES DAILY - WITH MEALS AND AT BEDTIME. (Patient taking differently: as needed.) 120 tablet 1   Current Facility-Administered Medications  Medication Dose Route Frequency Provider Last Rate Last Admin   0.9 %  sodium chloride infusion  500 mL Intravenous Once Danis, Kirke Corin, MD          ___________________________________________________________________ Objective   Exam:  BP (!) 106/54   Pulse 79   Temp 98.2 F (36.8 C) (Temporal)   Ht 5\' 4"  (1.626 m)   Wt 189 lb (85.7 kg)   SpO2 100%   BMI 32.44 kg/m   CV: regular , S1/S2 Resp: clear to auscultation bilaterally, normal RR and  effort noted GI: soft, no tenderness, with active bowel sounds.   Assessment: Encounter Diagnoses  Name Primary?   Chronic diarrhea Yes   Weight loss      Plan: Colonoscopy EGD   The patient is appropriate for an endoscopic procedure in the ambulatory setting.   - Wilfrid Lund, MD

## 2022-11-15 NOTE — Patient Instructions (Signed)
Please read handouts provided. Continue present medications. Await pathology results. Repeat colonoscopy in 10 years for screening.   YOU HAD AN ENDOSCOPIC PROCEDURE TODAY AT [facility]:   Refer to the procedure report that was given to you for any specific questions about what was found during the examination.  If the procedure report does not answer your questions, please call your gastroenterologist to clarify.  If you requested that your care partner not be given the details of your procedure findings, then the procedure report has been included in a sealed envelope for you to review at your convenience later.  YOU SHOULD EXPECT: Some feelings of bloating in the abdomen. Passage of more gas than usual.  Walking can help get rid of the air that was put into your GI tract during the procedure and reduce the bloating. If you had a lower endoscopy (such as a colonoscopy or flexible sigmoidoscopy) you may notice spotting of blood in your stool or on the toilet paper. If you underwent a bowel prep for your procedure, you may not have a normal bowel movement for a few days.  Please Note:  You might notice some irritation and congestion in your nose or some drainage.  This is from the oxygen used during your procedure.  There is no need for concern and it should clear up in a day or so.  SYMPTOMS TO REPORT IMMEDIATELY:  Following lower endoscopy (colonoscopy or flexible sigmoidoscopy):  Excessive amounts of blood in the stool  Significant tenderness or worsening of abdominal pains  Swelling of the abdomen that is new, acute  Fever of 100F or higher  Following upper endoscopy (EGD)  Vomiting of blood or coffee ground material  New chest pain or pain under the shoulder blades  Painful or persistently difficult swallowing  New shortness of breath  Fever of 100F or higher  Black, tarry-looking stools  For urgent or emergent issues, a gastroenterologist can be reached at any  hour by calling [phone]. Do not use MyChart messaging for urgent concerns.    DIET:  We do recommend a small meal at first, but then you may proceed to your regular diet.  Drink plenty of fluids but you should avoid alcoholic beverages for 24 hours.  ACTIVITY:  You should plan to take it easy for the rest of today and you should NOT DRIVE or use heavy machinery until tomorrow (because of the sedation medicines used during the test).    FOLLOW UP: Our staff will call the number listed on your records the next business day following your procedure.  We will call around 7:15- 8:00 am to check on you and address any questions or concerns that you may have regarding the information given to you following your procedure. If we do not reach you, we will leave a message.     If any biopsies were taken you will be contacted by phone or by letter within the next 1-3 weeks.  Please call us at [phone] if you have not heard about the biopsies in 3 weeks.    SIGNATURES/CONFIDENTIALITY: You and/or your care partner have signed paperwork which will be entered into your electronic medical record.  These signatures attest to the fact that that the information above on your After Visit Summary has been reviewed and is understood.  Full responsibility of the confidentiality of this discharge information lies with you and/or your care-partner.

## 2022-11-15 NOTE — Progress Notes (Signed)
Called to room to assist during endoscopic procedure.  Patient ID and intended procedure confirmed with present staff. Received instructions for my participation in the procedure from the performing physician.  

## 2022-11-15 NOTE — Op Note (Signed)
[place] Patient Name: Virginia [name] Procedure Date: 11/15/2022 3:10 PM MRN: [id number] Endoscopist: Mallie Mussel L. Loletha Carrow , MD, [id number] Age: 42 Referring MD:  Date of Birth: 1981-03-29 Gender: Female Account #: [id number] Procedure:                Colonoscopy Indications:              Chronic diarrhea, Weight loss                           see Dec 2023 office consult note for details on                            clinical course and workup                           subsequent CT chest/A/P without source of symptoms                           little improvement on levsin Medicines:                Monitored Anesthesia Care Procedure:                Pre-Anesthesia Assessment:                           - Prior to the procedure, a History and Physical                            was performed, and patient medications and                            allergies were reviewed. The patient's tolerance of                            previous anesthesia was also reviewed. The risks                            and benefits of the procedure and the sedation                            options and risks were discussed with the patient.                            All questions were answered, and informed consent                            was obtained. Prior Anticoagulants: The patient has                            taken no anticoagulant or antiplatelet agents. ASA                            Grade Assessment: II - A patient with mild systemic  disease. After reviewing the risks and benefits,                            the patient was deemed in satisfactory condition to                            undergo the procedure.                           After obtaining informed consent, the colonoscope                            was passed under direct vision. Throughout the                            procedure, the patient's blood pressure, pulse, and                             oxygen saturations were monitored continuously. The                            CF [id number] #[id number] was introduced through the anus                            and advanced to the the terminal ileum, with                            identification of the appendiceal orifice and IC                            valve. The colonoscopy was performed without                            difficulty. The patient tolerated the procedure                            well. The quality of the bowel preparation was                            good. The terminal ileum, ileocecal valve,                            appendiceal orifice, and rectum were photographed. Scope In: 3:17:39 PM Scope Out: 3:31:29 PM Scope Withdrawal Time: 0 hours 10 minutes 50 seconds  Total Procedure Duration: 0 hours 13 minutes 50 seconds  Findings:                 The perianal and digital rectal examinations were                            normal.                           The terminal ileum appeared normal.  Normal mucosa was found in the entire colon.                            Biopsies for histology were taken with a cold                            forceps from the ascending colon, transverse colon                            and sigmoid colon for evaluation of microscopic                            colitis.                           Repeat examination of right colon under NBI                            performed.                           There is no endoscopic evidence of polyps in the                            entire colon.                           Internal hemorrhoids were found. The hemorrhoids                            were small.                           The exam was otherwise without abnormality on                            direct and retroflexion views. Complications:            No immediate complications. Estimated Blood Loss:     Estimated blood loss was minimal. Impression:               -  The examined portion of the ileum was normal.                           - Normal mucosa in the entire examined colon.                            Biopsied.                           - Internal hemorrhoids.                           - The examination was otherwise normal on direct                            and retroflexion views. Recommendation:           -  Patient has a contact number available for                            emergencies. The signs and symptoms of potential                            delayed complications were discussed with the                            patient. Return to normal activities tomorrow.                            Written discharge instructions were provided to the                            patient.                           - Resume previous diet.                           - Continue present medications.                           - Await pathology results.                           - Repeat colonoscopy in 10 years for screening                            purposes.                           - See the other procedure note for documentation of                            additional recommendations. Nellie Pester L. Myrtie Neither, MD 11/15/2022 3:46:31 PM This report has been signed electronically.

## 2022-11-15 NOTE — Progress Notes (Signed)
VS completed by DT.  Pt's states no medical or surgical changes since previsit or office visit.  

## 2022-11-15 NOTE — Telephone Encounter (Signed)
Erroneous encounter. Please disregard.

## 2022-11-15 NOTE — Progress Notes (Signed)
1511 Robinul 0.1 mg IV given due large amount of secretions upon assessment.  MD made aware, vss  

## 2022-11-16 ENCOUNTER — Telehealth: Payer: Self-pay

## 2022-11-16 NOTE — Telephone Encounter (Signed)
Per 11/15/22 procedure report - Return to my office at appt to be scheduled  Patient has been scheduled for a follow up appt with Dr. Loletha Carrow on Monday, 12/18/22 at 2 pm. Appt information mailed and sent to patient via [place].

## 2022-11-16 NOTE — Telephone Encounter (Signed)
  Follow up Call-     11/15/2022    2:22 PM  Call back number  Post procedure Call Back phone  # [phone]  Permission to leave phone message Yes     Follow up call made.  NALM

## 2022-11-22 ENCOUNTER — Other Ambulatory Visit: Payer: Self-pay

## 2022-11-22 ENCOUNTER — Other Ambulatory Visit: Payer: Self-pay | Admitting: Family Medicine

## 2022-11-22 DIAGNOSIS — R634 Abnormal weight loss: Secondary | ICD-10-CM

## 2022-11-22 DIAGNOSIS — K529 Noninfective gastroenteritis and colitis, unspecified: Secondary | ICD-10-CM

## 2022-11-22 MED ORDER — RIFAXIMIN 550 MG PO TABS: 550.0000 mg | ORAL_TABLET | Freq: Three times a day (TID) | ORAL | 0 refills | Status: AC

## 2022-11-23 ENCOUNTER — Encounter: Payer: Self-pay | Admitting: *Deleted

## 2022-11-23 NOTE — Telephone Encounter (Signed)
Requested medication (s) are due for refill today: yes  Requested medication (s) are on the active medication list: yes  Last refill:  09/25/22 #15 3 refills  Future visit scheduled: no  Notes to clinic:  not delegated per protocol. Last OV 07/21/22 do you want to refill Rx?     Requested Prescriptions  Pending Prescriptions Disp Refills   temazepam (RESTORIL) 30 MG capsule [Pharmacy Med Name: TEMAZEPAM 30 MG CAPSULE] 15 capsule 3    Sig: TAKE 1 CAPSULE AT BEDTIME AS NEEDED FOR SLEEP (DO NOT TAKE WITH KLONOPIN).     Not Delegated - Psychiatry: Anxiolytics/Hypnotics 2 Failed - 11/23/2022 10:25 AM      Failed - This refill cannot be delegated      Failed - Urine Drug Screen completed in last 360 days      Failed - Valid encounter within last 6 months    Recent Outpatient Visits           1 year ago Pelvic pain   [place] [facility]enn[facility] Schaumann, Cammie Mcgee, MD   1 year ago Viral upper respiratory tract infection   [place] Dennard Schaumann Cammie Mcgee, MD   1 year ago Elevated serum creatinine   [place] Denn[facility] Schaumann, Cammie Mcgee, MD   1 year ago Cervical radiculopathy   [place] Dennard Schaumann Cammie Mcgee, MD   1 year ago Diarrhea, unspecified type   [place], [place], NP              Passed - Patient is not pregnant

## 2022-11-30 ENCOUNTER — Other Ambulatory Visit: Payer: Self-pay | Admitting: Family Medicine

## 2022-11-30 DIAGNOSIS — I10 Essential (primary) hypertension: Secondary | ICD-10-CM

## 2022-12-09 ENCOUNTER — Other Ambulatory Visit: Payer: Self-pay | Admitting: Family Medicine

## 2022-12-09 DIAGNOSIS — F411 Generalized anxiety disorder: Secondary | ICD-10-CM

## 2022-12-18 ENCOUNTER — Ambulatory Visit (INDEPENDENT_AMBULATORY_CARE_PROVIDER_SITE_OTHER): Payer: BC Managed Care – PPO | Admitting: Gastroenterology

## 2022-12-18 ENCOUNTER — Encounter: Payer: Self-pay | Admitting: Gastroenterology

## 2022-12-18 ENCOUNTER — Telehealth: Payer: Self-pay

## 2022-12-18 VITALS — BP 120/78 | HR 77 | Ht 64.0 in | Wt 199.0 lb

## 2022-12-18 DIAGNOSIS — K529 Noninfective gastroenteritis and colitis, unspecified: Secondary | ICD-10-CM

## 2022-12-18 DIAGNOSIS — K644 Residual hemorrhoidal skin tags: Secondary | ICD-10-CM

## 2022-12-18 DIAGNOSIS — R140 Abdominal distension (gaseous): Secondary | ICD-10-CM

## 2022-12-18 DIAGNOSIS — K629 Disease of anus and rectum, unspecified: Secondary | ICD-10-CM | POA: Diagnosis not present

## 2022-12-18 MED ORDER — METRONIDAZOLE 250 MG PO TABS: 250.0000 mg | ORAL_TABLET | Freq: Three times a day (TID) | ORAL | 0 refills | Status: DC

## 2022-12-18 NOTE — Patient Instructions (Addendum)
_______________________________________________________  If your blood pressure at your visit was 140/90 or greater, please contact your primary care physician to follow up on this.  _______________________________________________________  If you are age 43 or older, your body mass index should be between 23-30. Your Body mass index is 34.16 kg/m. If this is out of the aforementioned range listed, please consider follow up with your Primary Care Provider.  If you are age 54 or younger, your body mass index should be between 19-25. Your Body mass index is 34.16 kg/m. If this is out of the aformentioned range listed, please consider follow up with your Primary Care Provider.   ________________________________________________________  The [facility] providers would like to encourage you to use [facility] to communicate with providers for non-urgent requests or questions.  Due to long hold times on the telephone, sending your provider a message by [facility] may be a faster and more efficient way to get a response.  Please allow 48 business hours for a response.  Please remember that this is for non-urgent requests.  _______________________________________________________  Dennis Bast have sent your records to [facility]. They will reach out to you to schedule.  [facility] is located at [address]. Should you need to reschedule your appointment, please contact them at [phone].   Your provider has requested that you go to the basement level for lab work before leaving today. Press "B" on the elevator. The lab is located at the first door on the left as you exit the elevator.  Due to recent changes in healthcare laws, you may see the results of your imaging and laboratory studies on MyChart before your provider has had a chance to review them.  We understand that in some cases there may be results that are confusing or concerning to you. Not all laboratory  results come back in the same time frame and the provider may be waiting for multiple results in order to interpret others.  Please give [place] 48 hours in order for your provider to thoroughly review all the results before contacting the office for clarification of your results.   It was a pleasure to see you today!  Thank you for trusting me with your gastrointestinal care!

## 2022-12-18 NOTE — Progress Notes (Signed)
[facility] Progress Note  Chief Complaint:  Chief Complaint  Patient presents with   Diarrhea    Patient states she continues to have diarrhea at least two times a day.     Subjective  History: Virginia [name] was seen here in September 2020 for epigastric pain and nausea, upper endoscopy performed.  Normal study, gastric biopsies normal, soft show biopsies with mild increase in eosinophils. Subsequent CTAP showing nonspecific finding of some retained contrast in distal esophagus. Seen here August 2022 for months of symptoms (pain and bleeding) from a posterior anal fissure.  Patient was last seen by me on 10/10/22 for persistent diarrhea since 04/2022 with accompanying decreased appetite and secondary 40-50lb weight loss. She also had pain and bleeding with bowel movements that has been attributed to fissures. She had little improvement on Hyoscyamine. She has since had an upper endoscopy and colonoscopy on 11/15/22. Small intestine and colon biopsies were normal. She was started on Rifaximin 522m TID for 14 days at that time. Fecal elastase ordered as well - no results.  Today, she states that she continues to have minimal improvement. She continues to have loose stools most days. She is not using any antidiarrheals. She has gained some weight. She has some blood with bowel movements. She reports needing to use flushable wipes due to the pain she experiences with bowel movements.  The perianal lesions are bothering her and she hopes to have them addressed. She was unable to start Rifaximin due to the cost.  (Over thousand dollars with insurance) She continues to have persistent issues with diarrhea, on average at least 2 loose stools per day with some urgency.  Her personal life stressors have improved somewhat.   ROS: Review of Systems  Constitutional:  Negative for appetite change, fever and unexpected weight change.  HENT:  Negative for trouble swallowing.   Respiratory:   Negative for cough and shortness of breath.   Cardiovascular:  Negative for chest pain.  Gastrointestinal:  Positive for anal bleeding, diarrhea and rectal pain. Negative for abdominal distention, abdominal pain, blood in stool, constipation, nausea and vomiting.  Genitourinary:  Negative for dysuria.  Musculoskeletal:  Negative for back pain.  Skin:  Negative for rash.  Neurological:  Negative for weakness.  All other systems reviewed and are negative.   The patient's Past Medical, Family and Social History were reviewed and are on file in the EMR. Past Medical History:  Diagnosis Date   Anxiety    GERD (gastroesophageal reflux disease)    Headache    Insomnia    [place]    dx 2017   Ne[place]romuscular disorder ([place]    [employer] 06/30/2020   Nystagmus 02/15/2017   PAC (premature atrial contraction)    PVC (premature ventricular contraction)    Syncope and collapse    Tachycardia     Objective:  Med list reviewed  Current Outpatient Medications:    AIMOVIG 140 MG/ML SOAJ, INJECT 140 MG INTO THE SKIN EVERY 30 (THIRTY) DAYS, Disp: 1 mL, Rfl: 5   amitriptyline (ELAVIL) 50 MG tablet, TAKE 1 TABLET BY MOUTH EVERY DAY AT BEDTIME AS NEEDED FOR SLEEP, Disp: 90 tablet, Rfl: 3   atenolol (TENORMIN) 50 MG tablet, TAKE 1 TABLET BY MOUTH EVERY DAY, Disp: 60 tablet, Rfl: 0   baclofen (LIORESAL) 10 MG tablet, TAKE 2TABLETS BY MOUTH 3 TIMES A DAY FOR SPASTICITY, Disp: 540 tablet, Rfl: 3   clonazePAM (KLONOPIN) 1 MG tablet, TAKE 1 TABLET BY MOUTH  TWICE A DAY AS NEEDED FOR ANXIETY, Disp: 20 tablet, Rfl: 1   diltiazem 2 % GEL, Apply 1 Application topically 3 (three) times daily., Disp: 60 g, Rfl: 1   DULoxetine (CYMBALTA) 60 MG capsule, TAKE 1 CAPSULE BY MOUTH EVERY DAY, Disp: 90 capsule, Rfl: 2   etodolac (LODINE) 400 MG tablet, Take 1 tablet (400 mg total) by mouth 2 (two) times daily., Disp: 60 tablet, Rfl: 5   gabapentin (NEURONTIN) 600 MG tablet, Take 1 tablet (600 mg total) by  mouth 4 (four) times daily., Disp: 120 tablet, Rfl: 11   HAILEY 24 FE 1-20 MG-MCG(24) tablet, TAKE 1 TABLET BY MOUTH EVERY DAY, Disp: 84 tablet, Rfl: 3   hydrocortisone 2.5 % cream, Apply topically 2 (two) times daily., Disp: 30 g, Rfl: 0   hyoscyamine (LEVSIN SL) 0.125 MG SL tablet, Place 1 tablet (0.125 mg total) under the tongue every 6 (six) hours as needed., Disp: 30 tablet, Rfl: 2   meclizine (ANTIVERT) 25 MG tablet, Take 1 tablet (25 mg total) by mouth 3 (three) times daily as needed for dizziness., Disp: 30 tablet, Rfl: 11   pantoprazole (PROTONIX) 40 MG tablet, TAKE 1 TABLET BY MOUTH TWICE A DAY, Disp: 180 tablet, Rfl: 1   pramoxine-hydrocortisone cream, Apply topically 3 (three) times daily., Disp: 57 g, Rfl: 0   sucralfate (CARAFATE) 1 g tablet, TAKE 1 TABLET BY MOUTH 4 TIMES DAILY - WITH MEALS AND AT BEDTIME. (Patient taking differently: as needed.), Disp: 120 tablet, Rfl: 1   SUMAtriptan (IMITREX) 50 MG tablet, TAKE 1 TABLET EVERY 2 HRS AS NEEDED FOR MIGRAINE. MAY REPEAT IN 2 HRS IF HEADACHE PERSISTS OR RECURS, Disp: 10 tablet, Rfl: 5   temazepam (RESTORIL) 30 MG capsule, TAKE 1 CAPSULE AT BEDTIME AS NEEDED FOR SLEEP (DO NOT TAKE WITH KLONOPIN)., Disp: 15 capsule, Rfl: 3   Vital signs in last 24 hrs: Vitals:   12/18/22 1350  BP: 120/78  Pulse: 77  SpO2: 98%   Wt Readings from Last 3 Encounters:  12/18/22 199 lb (90.3 kg)  11/15/22 189 lb (85.7 kg)  10/10/22 189 lb (85.7 kg)     Physical Exam General: well-appearing  HEENT: sclera anicteric, oral mucosa moist without lesions Neck: supple, no thyromegaly, JVD or lymphadenopathy Cardiac: RRR, no peripheral edema Pulm: clear to auscultation bilaterally, normal RR and effort noted Abdomen: soft, no tenderness, with active bowel sounds. No guarding or palpable hepatosplenomegaly. Perianal: 2 large skin tags, 1 is firm and tender, but not inflamed DRE: normal; no fissure, tenderness, or palpable internal lesion Skin: warm and  dry, no jaundice or rash  Labs:   ___________________________________________ Radiologic studies:  CT CHEST ABDOMEN PELVIS W CONTRAST Result Date: 10/23/2022 CLINICAL DATA:  Unintentional weight loss, diarrhea, rectal bleed EXAM: CT CHEST, ABDOMEN, AND PELVIS WITH CONTRAST TECHNIQUE: Multidetector CT imaging of the chest, abdomen and pelvis was performed following the standard protocol during bolus administration of intravenous contrast. RADIATION DOSE REDUCTION: This exam was performed according to the departmental dose-optimization program which includes automated exposure control, adjustment of the mA and/or kV according to patient size and/or use of iterative reconstruction technique. CONTRAST:  100 mL OMNIPAQUE IOHEXOL 300 MG/ML  SOLN COMPARISON:  10/27/2021 FINDINGS: CT CHEST FINDINGS Cardiovascular: No significant vascular findings. Normal heart size. No pericardial effusion. Mediastinum/Nodes: No enlarged mediastinal, hilar, or axillary lymph nodes. Thyroid gland, trachea, and esophagus demonstrate no significant findings. Lungs/Pleura: Lungs are clear. No pleural effusion or pneumothorax. Musculoskeletal: No chest wall mass or suspicious  bone lesions identified. CT ABDOMEN PELVIS FINDINGS Hepatobiliary: No focal liver abnormality is seen. No gallstones, gallbladder wall thickening, or biliary dilatation. Pancreas: Unremarkable. No pancreatic ductal dilatation or surrounding inflammatory changes. Spleen: Normal in size without focal abnormality. Adrenals/Urinary Tract: Adrenal glands are unremarkable. Right kidney demonstrates a 2 cm parapelvic midpole cyst. This does not need to be followed up with additional imaging. No nephrolithiasis or hydronephrosis. Bladder is unremarkable. Stomach/Bowel: Stomach is within normal limits. Appendix appears normal. No evidence of bowel wall thickening, distention, or inflammatory changes. Vascular/Lymphatic: No significant vascular findings are present. No  enlarged abdominal or pelvic lymph nodes. Reproductive: Uterus and bilateral adnexa are unremarkable. Other: No abdominal wall hernia or abnormality. No abdominopelvic ascites. Musculoskeletal: No acute or significant osseous findings. IMPRESSION: Right kidney cyst. Otherwise unremarkable examination of the chest, abdomen and pelvis. Electronically Signed   By: Sammie Bench M.D.   On: 10/23/2022 10:27     ____________________________________________ Other: Colonoscopy 11/15/2022: - The perianal and digital rectal examinations were normal. - The terminal ileum appeared normal. - Normal mucosa was found in the entire colon. Biopsies for histology were taken with a cold forceps from the ascending colon, transverse colon and sigmoid colon for evaluation of microscopic colitis. - Repeat examination of right colon under NBI performed. - There is no endoscopic evidence of polyps in the entire colon. - Internal hemorrhoids were found. The hemorrhoids were small. - The exam was otherwise without abnormality on direct and retroflexion views.  Upper Endoscopy 11/15/2022: - The perianal and digital rectal examinations were normal. - The terminal ileum appeared normal. - Normal mucosa was found in the entire colon. Biopsies for histology were taken with a cold forceps from the ascending colon, transverse colon and sigmoid colon for evaluation of microscopic colitis. - Repeat examination of right colon under NBI performed. - There is no endoscopic evidence of polyps in the entire colon. - Internal hemorrhoids were found. The hemorrhoids were small. - The exam was otherwise without abnormality on direct and retroflexion views.   All endoscopic biopsies normal.  _____________________________________________ Assessment & Plan   Assessment: Skin tag of perianal region  Chronic diarrhea  Bloating  Perianal lesion  Most likely IBS-D, less likely EPI or SIBO.  Painful perianal skin tags, patient requests  surgical evaluation for excision.   Plan: Flagyl 240m TID 14 days as empiric therapy for possible SIBO (second line due to prohibitive cost of rifaximin) Referral to colorectal surg for painful perianal skin tags Pancreatic elastase stool test  I, HNelida MeuseIII, MD, have reviewed all documentation for this visit. The documentation on 12/18/22 for the exam, diagnosis, procedures, and orders are all accurate and complete.   -Wilfrid Lund MD    [facility]   30 minutes were spent on this encounter (including chart review, history/exam, counseling/coordination of care, and documentation) > 50% of that time was spent on counseling and coordination of care.    I,Alexis Herring,acting as a sEducation administratorfor HSnook MD.,have documented all relevant documentation on the behalf of HDoran Stabler MD,as directed by  HDoran Stabler MD while in the presence of HDoran Stabler MD.     AEugene Gavia

## 2022-12-18 NOTE — Progress Notes (Deleted)
[facility] Progress Note  Chief Complaint:  Chief Complaint  Patient presents with   Diarrhea    Patient states she continues to have diarrhea at least two times a day.     Subjective  History: Virginia [name] was seen here in September 2020 for epigastric pain and nausea, upper endoscopy performed.  Normal study, gastric biopsies normal, soft show biopsies with mild increase in eosinophils. Subsequent CTAP showing nonspecific finding of some retained contrast in distal esophagus. Seen here August 2022 for months of symptoms (pain and bleeding) from a posterior anal fissure.  Patient was last seen by me on 10/10/22 for persistent diarrhea since 04/2022 with accompanying decreased appetite and secondary 40-50lb weight loss. She also had pain and bleeding with bowel movements that has been attributed to fissures. She had little improvement on Hyoscyamine. She has since had an upper endoscopy and colonoscopy on 11/15/22. Small intestine and colon biopsies were normal. She was started on Rifaximin 583m TID for 14 days at that time. Fecal elastase ordered as well - no results.  Today,   ROS: Review of Systems  Constitutional:  Negative for appetite change and fever.  HENT:  Negative for trouble swallowing.   Respiratory:  Negative for cough and shortness of breath.   Cardiovascular:  Negative for chest pain.  Gastrointestinal:  Negative for abdominal distention, abdominal pain, anal bleeding, blood in stool, constipation, diarrhea, nausea, rectal pain and vomiting.  Genitourinary:  Negative for dysuria.  Musculoskeletal:  Negative for back pain.  Skin:  Negative for rash.  Neurological:  Negative for weakness.  All other systems reviewed and are negative.    The patient's Past Medical, Family and Social History were reviewed and are on file in the EMR.  Objective:  Med list reviewed  Current Outpatient Medications:    AIMOVIG 140 MG/ML SOAJ, INJECT 140 MG INTO THE SKIN  EVERY 30 (THIRTY) DAYS, Disp: 1 mL, Rfl: 5   amitriptyline (ELAVIL) 50 MG tablet, TAKE 1 TABLET BY MOUTH EVERY DAY AT BEDTIME AS NEEDED FOR SLEEP, Disp: 90 tablet, Rfl: 3   atenolol (TENORMIN) 50 MG tablet, TAKE 1 TABLET BY MOUTH EVERY DAY, Disp: 60 tablet, Rfl: 0   baclofen (LIORESAL) 10 MG tablet, TAKE 2TABLETS BY MOUTH 3 TIMES A DAY FOR SPASTICITY, Disp: 540 tablet, Rfl: 3   clonazePAM (KLONOPIN) 1 MG tablet, TAKE 1 TABLET BY MOUTH TWICE A DAY AS NEEDED FOR ANXIETY, Disp: 20 tablet, Rfl: 1   diltiazem 2 % GEL, Apply 1 Application topically 3 (three) times daily., Disp: 60 g, Rfl: 1   DULoxetine (CYMBALTA) 60 MG capsule, TAKE 1 CAPSULE BY MOUTH EVERY DAY, Disp: 90 capsule, Rfl: 2   etodolac (LODINE) 400 MG tablet, Take 1 tablet (400 mg total) by mouth 2 (two) times daily., Disp: 60 tablet, Rfl: 5   gabapentin (NEURONTIN) 600 MG tablet, Take 1 tablet (600 mg total) by mouth 4 (four) times daily., Disp: 120 tablet, Rfl: 11   HAILEY 24 FE 1-20 MG-MCG(24) tablet, TAKE 1 TABLET BY MOUTH EVERY DAY, Disp: 84 tablet, Rfl: 3   hydrocortisone 2.5 % cream, Apply topically 2 (two) times daily., Disp: 30 g, Rfl: 0   hyoscyamine (LEVSIN SL) 0.125 MG SL tablet, Place 1 tablet (0.125 mg total) under the tongue every 6 (six) hours as needed., Disp: 30 tablet, Rfl: 2   meclizine (ANTIVERT) 25 MG tablet, Take 1 tablet (25 mg total) by mouth 3 (three) times daily as needed for dizziness., Disp: 30  tablet, Rfl: 11   pantoprazole (PROTONIX) 40 MG tablet, TAKE 1 TABLET BY MOUTH TWICE A DAY, Disp: 180 tablet, Rfl: 1   pramoxine-hydrocortisone cream, Apply topically 3 (three) times daily., Disp: 57 g, Rfl: 0   sucralfate (CARAFATE) 1 g tablet, TAKE 1 TABLET BY MOUTH 4 TIMES DAILY - WITH MEALS AND AT BEDTIME. (Patient taking differently: as needed.), Disp: 120 tablet, Rfl: 1   SUMAtriptan (IMITREX) 50 MG tablet, TAKE 1 TABLET EVERY 2 HRS AS NEEDED FOR MIGRAINE. MAY REPEAT IN 2 HRS IF HEADACHE PERSISTS OR RECURS, Disp: 10  tablet, Rfl: 5   temazepam (RESTORIL) 30 MG capsule, TAKE 1 CAPSULE AT BEDTIME AS NEEDED FOR SLEEP (DO NOT TAKE WITH KLONOPIN)., Disp: 15 capsule, Rfl: 3   Vital signs in last 24 hrs: Vitals:   12/18/22 1350  BP: 120/78  Pulse: 77  SpO2: 98%   Wt Readings from Last 3 Encounters:  12/18/22 199 lb (90.3 kg)  11/15/22 189 lb (85.7 kg)  10/10/22 189 lb (85.7 kg)     Physical Exam General: well-appearing  HEENT: sclera anicteric, oral mucosa moist without lesions Neck: supple, no thyromegaly, JVD or lymphadenopathy Cardiac: ,  no peripheral edema Pulm: clear to auscultation bilaterally, normal RR and effort noted Abdomen: soft,  tenderness, with active bowel sounds. No guarding or palpable hepatosplenomegaly. Skin: warm and dry, no jaundice or rash  Labs:   ___________________________________________ Radiologic studies:  CT CHEST ABDOMEN PELVIS W CONTRAST Result Date: 10/23/2022 CLINICAL DATA:  Unintentional weight loss, diarrhea, rectal bleed EXAM: CT CHEST, ABDOMEN, AND PELVIS WITH CONTRAST TECHNIQUE: Multidetector CT imaging of the chest, abdomen and pelvis was performed following the standard protocol during bolus administration of intravenous contrast. RADIATION DOSE REDUCTION: This exam was performed according to the departmental dose-optimization program which includes automated exposure control, adjustment of the mA and/or kV according to patient size and/or use of iterative reconstruction technique. CONTRAST:  100 mL OMNIPAQUE IOHEXOL 300 MG/ML  SOLN COMPARISON:  10/27/2021 FINDINGS: CT CHEST FINDINGS Cardiovascular: No significant vascular findings. Normal heart size. No pericardial effusion. Mediastinum/Nodes: No enlarged mediastinal, hilar, or axillary lymph nodes. Thyroid gland, trachea, and esophagus demonstrate no significant findings. Lungs/Pleura: Lungs are clear. No pleural effusion or pneumothorax. Musculoskeletal: No chest wall mass or suspicious bone lesions  identified. CT ABDOMEN PELVIS FINDINGS Hepatobiliary: No focal liver abnormality is seen. No gallstones, gallbladder wall thickening, or biliary dilatation. Pancreas: Unremarkable. No pancreatic ductal dilatation or surrounding inflammatory changes. Spleen: Normal in size without focal abnormality. Adrenals/Urinary Tract: Adrenal glands are unremarkable. Right kidney demonstrates a 2 cm parapelvic midpole cyst. This does not need to be followed up with additional imaging. No nephrolithiasis or hydronephrosis. Bladder is unremarkable. Stomach/Bowel: Stomach is within normal limits. Appendix appears normal. No evidence of bowel wall thickening, distention, or inflammatory changes. Vascular/Lymphatic: No significant vascular findings are present. No enlarged abdominal or pelvic lymph nodes. Reproductive: Uterus and bilateral adnexa are unremarkable. Other: No abdominal wall hernia or abnormality. No abdominopelvic ascites. Musculoskeletal: No acute or significant osseous findings. IMPRESSION: Right kidney cyst. Otherwise unremarkable examination of the chest, abdomen and pelvis. Electronically Signed   By: Sammie Bench M.D.   On: 10/23/2022 10:27     ____________________________________________ Other: Colonoscopy 11/15/2022: - The perianal and digital rectal examinations were normal. - The terminal ileum appeared normal. - Normal mucosa was found in the entire colon. Biopsies for histology were taken with a cold forceps from the ascending colon, transverse colon and sigmoid colon for evaluation of microscopic  colitis. - Repeat examination of right colon under NBI performed. - There is no endoscopic evidence of polyps in the entire colon. - Internal hemorrhoids were found. The hemorrhoids were small. - The exam was otherwise without abnormality on direct and retroflexion views.  Upper Endoscopy 11/15/2022: - The perianal and digital rectal examinations were normal. - The terminal ileum appeared normal. -  Normal mucosa was found in the entire colon. Biopsies for histology were taken with a cold forceps from the ascending colon, transverse colon and sigmoid colon for evaluation of microscopic colitis. - Repeat examination of right colon under NBI performed. - There is no endoscopic evidence of polyps in the entire colon. - Internal hemorrhoids were found. The hemorrhoids were small. - The exam was otherwise without abnormality on direct and retroflexion views.   All endoscopic biopsies normal.  _____________________________________________ Assessment & Plan   Assessment: No diagnosis found.     Plan:    minutes were spent on this encounter (including chart review, history/exam, counseling/coordination of care, and documentation) > 50% of that time was spent on counseling and coordination of care.    I,Alexis Herring,acting as a Education administrator for [place], MD.,have documented all relevant documentation on the behalf of Doran Stabler, MD,as directed by  Doran Stabler, MD while in the presence of Doran Stabler, MD.     Eugene Gavia

## 2022-12-18 NOTE — Telephone Encounter (Signed)
Referral faxed to [facility] surgery.

## 2022-12-26 ENCOUNTER — Encounter: Payer: Self-pay | Admitting: Neurology

## 2022-12-26 ENCOUNTER — Other Ambulatory Visit: Payer: Self-pay | Admitting: Family Medicine

## 2022-12-26 DIAGNOSIS — I10 Essential (primary) hypertension: Secondary | ICD-10-CM

## 2022-12-26 NOTE — Telephone Encounter (Signed)
Printed message. Gave to Kim/[facility]. They will call pt.

## 2022-12-28 NOTE — Telephone Encounter (Signed)
Patient has been scheduled for 01-11-2023 with Dr Dema Severin

## 2023-01-03 ENCOUNTER — Other Ambulatory Visit: Payer: Self-pay | Admitting: *Deleted

## 2023-01-03 ENCOUNTER — Other Ambulatory Visit: Payer: Self-pay | Admitting: Family Medicine

## 2023-01-03 ENCOUNTER — Other Ambulatory Visit: Payer: Self-pay

## 2023-01-03 ENCOUNTER — Telehealth: Payer: Self-pay

## 2023-01-03 DIAGNOSIS — Z79899 Other long term (current) drug therapy: Secondary | ICD-10-CM

## 2023-01-03 DIAGNOSIS — G35 Multiple sclerosis: Secondary | ICD-10-CM

## 2023-01-03 NOTE — Telephone Encounter (Signed)
Prescription Request  01/03/2023  LOV: 07/21/2022  What is the name of the medication or equipment?   DULoxetine (CYMBALTA) 60 MG capsule  **90 DAY SCRIPT REQUEST**  Have you contacted your pharmacy to request a refill? YES  Which pharmacy would you like this sent to?  [id number] [place] [id number] [id number]: [phone]: [phone]   Patient notified that their request is being sent to the clinical staff for review and that they should receive a response within 2 business days.   Please advise pharmacist.

## 2023-01-03 NOTE — Telephone Encounter (Signed)
Placed [facility] in [employer] for Pick-up. Results pending.

## 2023-01-03 NOTE — Telephone Encounter (Signed)
Unable to refill per protocol, Rx request is too soon. Last refill 09/01/22 for 90 and 2 refills.  Requested Prescriptions  Pending Prescriptions Disp Refills   DULoxetine (CYMBALTA) 60 MG capsule      Sig: Take by mouth daily.     Psychiatry: Antidepressants - SNRI - duloxetine Failed - 01/03/2023 10:40 AM      Failed - Valid encounter within last 6 months    Recent Outpatient Visits           1 year ago Pelvic pain   [place] Dennard Schaumann, Cammie Mcgee, MD   1 year ago Viral upper respiratory tract infection   [place] Dennard Schaumann, Cammie Mcgee, MD   1 year ago Elevated serum creatinine   [place] Dennard Schaumann, Cammie Mcgee, MD   1 year ago Cervical radiculopathy   [place] Susy Frizzle, MD   1 year ago Diarrhea, unspecified type   [place] Eulogio Bear, NP              Passed - Cr in normal range and within 360 days    Creat  Date Value Ref Range Status  10/03/2021 0.91 0.50 - 0.99 mg/dL Final   Creatinine, Ser  Date Value Ref Range Status  07/25/2022 0.89 0.57 - 1.00 mg/dL Final   Creatinine, Urine  Date Value Ref Range Status  07/26/2021 43 20 - 275 mg/dL Final         Passed - eGFR is 30 or above and within 360 days    GFR, Est African American  Date Value Ref Range Status  08/24/2020 93 > OR = 60 mL/min/1.29m Final   GFR, Est Non African American  Date Value Ref Range Status  08/24/2020 80 > OR = 60 mL/min/1.777mFinal   GFR, Estimated  Date Value Ref Range Status  09/27/2021 >60 >60 mL/min Final    Comment:    (NOTE) Calculated using the CKD-EPI Creatinine Equation (2021)    eGFR  Date Value Ref Range Status  07/25/2022 83 >59 mL/min/1.73 Final         Passed - Completed PHQ-2 or PHQ-9 in the last 360 days      Passed - Last BP in normal range    BP Readings from Last 1 Encounters:  12/18/22 120/78

## 2023-01-03 NOTE — Telephone Encounter (Signed)
LOV- 07/25/22 Requested Prescriptions  Pending Prescriptions Disp Refills   pantoprazole (PROTONIX) 40 MG tablet [Pharmacy Med Name: PANTOPRAZOLE SOD DR 40 MG TAB] 180 tablet 1    Sig: TAKE 1 TABLET BY MOUTH TWICE A DAY     Gastroenterology: Proton Pump Inhibitors Failed - 01/03/2023  1:36 AM      Failed - Valid encounter within last 12 months    Recent Outpatient Visits           1 year ago Pelvic pain   [place] Susy Frizzle, MD   1 year ago Viral upper respiratory tract infection   [place] Dennard Schaumann Cammie Mcgee, MD   1 year ago Elevated serum creatinine   [place] Pickard, Cammie Mcgee, MD   1 year ago Cervical radiculopathy   [place] Dennard Schaumann Cammie Mcgee, MD   1 year ago Diarrhea, unspecified type   [place] Eulogio Bear, NP

## 2023-01-04 ENCOUNTER — Ambulatory Visit (INDEPENDENT_AMBULATORY_CARE_PROVIDER_SITE_OTHER): Payer: BC Managed Care – PPO | Admitting: Neurology

## 2023-01-04 ENCOUNTER — Encounter: Payer: Self-pay | Admitting: Neurology

## 2023-01-04 VITALS — BP 112/77 | HR 78 | Ht 64.0 in | Wt 200.2 lb

## 2023-01-04 DIAGNOSIS — G43709 Chronic migraine without aura, not intractable, without status migrainosus: Secondary | ICD-10-CM

## 2023-01-04 DIAGNOSIS — R208 Other disturbances of skin sensation: Secondary | ICD-10-CM

## 2023-01-04 DIAGNOSIS — R5383 Other fatigue: Secondary | ICD-10-CM

## 2023-01-04 DIAGNOSIS — G35 Multiple sclerosis: Secondary | ICD-10-CM | POA: Diagnosis not present

## 2023-01-04 DIAGNOSIS — Z79899 Other long term (current) drug therapy: Secondary | ICD-10-CM

## 2023-01-04 LAB — CBC WITH DIFFERENTIAL/PLATELET
Basophils Absolute: 0 10*3/uL (ref 0.0–0.2)
Basos: 0 %
EOS (ABSOLUTE): 0.4 10*3/uL (ref 0.0–0.4)
Eos: 5 %
Hematocrit: 42.7 % (ref 34.0–46.6)
Hemoglobin: 14.5 g/dL (ref 11.1–15.9)
Immature Grans (Abs): 0 10*3/uL (ref 0.0–0.1)
Immature Granulocytes: 0 %
Lymphocytes Absolute: 1.9 10*3/uL (ref 0.7–3.1)
Lymphs: 24 %
MCH: 34 pg — ABNORMAL HIGH (ref 26.6–33.0)
MCHC: 34 g/dL (ref 31.5–35.7)
MCV: 100 fL — ABNORMAL HIGH (ref 79–97)
Monocytes Absolute: 0.5 10*3/uL (ref 0.1–0.9)
Monocytes: 7 %
Neutrophils Absolute: 5 10*3/uL (ref 1.4–7.0)
Neutrophils: 64 %
Platelets: 296 10*3/uL (ref 150–450)
RBC: 4.26 x10E6/uL (ref 3.77–5.28)
RDW: 12.4 % (ref 11.7–15.4)
WBC: 7.9 10*3/uL (ref 3.4–10.8)

## 2023-01-04 MED ORDER — BUPROPION HCL ER (XL) 150 MG PO TB24: 150.0000 mg | ORAL_TABLET | Freq: Every day | ORAL | 5 refills | Status: DC

## 2023-01-04 NOTE — Progress Notes (Signed)
[facility]  PATIENT: Virginia [name] DOB: [birth year]  REFERRING DOCTOR OR PCP:    Virginia [name] SOURCE: patient, admit/discharge hospital notes, imaging reports, MRI's on PACS  _________________________________   HISTORICAL  CHIEF COMPLAINT:  Chief Complaint  Patient presents with   Follow-up    RM 11, alone. Last seen 06/27/22. MS DMT: Tysabri. Last infusion 01/03/23, tolerated well. Receives w/ intrafusion.     HISTORY OF PRESENT ILLNESS:  Virginia [name] is a 42 y.o. woman with relapsing remitting multiple sclerosis who also has back pain.  Update  01/04/2023: She went back to Tysabri as she tolerated it much better than Kesimpta  Last JCV 06/2022 was 0.17 negative.   One was drawn yesterday and is pending.     She denies any exacerbation.   Last Tysabri ws 01/03/2023   She is tolerating it well  MRI of the brain 6/112023 showed  T2/FLAIR hyperintense foci in the cerebral hemispheres and the left medulla in a pattern consistent with chronic demyelinating plaque associated with multiple sclerosis.  None of the foci appear to be acute.  Compared to the MRI dated 01/20/2020, there are no new lesions.  Gait is about the same with mildly reduced balance.   She stumbles but no recent falls.  She uses the bannister going downstairs   She can walk > 1/2 without a break but does not keep up well with others.  .  She notes spasticity and cramping in her legs unrelated to position or activity Baclofen 20 mg po bid has helped.     She has burning in her legs, right > left helped some by gabapentin 600 mg po qid and amitriptyline 50 mg nightly.    No vertigo.     She denies hearing loss.    Bladder function is fine.    Vision is fine.  She never had ON but had nystagmus in past, none recently  She notes fatigue is her main problem and some days she just wants to stay in bed.    She notes sleep is poor at night even with amitriptlne and gabapentin.    She has more trouble staying  asleep though sleep onset is ok.    Mood is fine.     She has low back pain, worse when she stands and some pain down le.  She has a disc herniation at L5-S1.  She also has neck pain but not arm pain.    Her chronic migraine is doing worse.  The Aimovig was discontinued as her co-pay was going to be very high.  She is still on amitriptyline.  When she was on both Aimovig and amitriptyline the migraines were doing very well.   She gets a few a month still and takes sumatriptan.  [name]   She lost some last year    MS History:   In mid May 2017, she was starting to experience occasional lightheadedness and a spinning vertigo. On 03/19/2016, she had more extreme vertigo and had an episode of syncope. She notices that when she was walking she would veer towards the right. Because of the syncope, she was taken to the emergency room. He had an MRI of the brain that showed several enhancing lesions, potentially worrisome for multiple sclerosis. Additional studies were performed. She received 3 days of IV steroids. The MRI's of the spine did not show any additional plaques. The lumbar puncture showed oligoclonal bands with normal IgG index, consistent with multiple sclerosis.  She started  Aubagio.   MRI in 2018 showed additional foci plus she had tolerability issues.   She has been on Tysabri since 12/26/2016.  She switched to [facility] 07/2021   She went back to Tysabri 07/2022.      LABS/LP She underwent a lumbar puncture on 03/21/2016. 4 oligoclonal bands present in the CSF which were not present in the serum, consistent with multiple sclerosis. The IgG index was high normal at 0.6.   There were only 4 white blood cells but 280 red blood cells more consistent with a slightly traumatic tap.    The [facility] Spotted Fever CSF IgG was negative but the IgM was positive at 1.41 (less than 0.9 normal).    ANA and ANCA were both negative.  IMAGING MRI images from 03/20/2016, 03/22/2016 and CT  angiogram images from 03/23/2016 reviewed: The MRI of the brain shows several 2/fair hyperintense foci, some in the periventricular and juxtacortical white matter. Most of the foci enhanced after contrast administration. MRI of the cervical and thoracic spine did not show any spinal cord plaques and there was no significant degenerative change.   CT angiogram was essentially normal showing no significant stenosis.  MRI 11/28/2016 showed multiple periventricular, subcortical, pericallosal as well as craniovertebral junction white matter hyperintensities compatible with multiple sclerosis. Multiple enhancing lesions are noted in the periventricular regions with the largest one in the left posterior frontal subcortical white matter. Incidental large venous angioma is noted in the right parietal region. Compared with MRI scan dated 03/20/2016 there are several new enhancing as well as nonenhancing white matter lesions.  MRI 12/25/2016 of the cervical spine showed two T2 hyperintense foci within the left posterolateral spinal cord, one adjacent to C6-C7 and one adjacent to T1. There may have been subtle signal within the spinal cord adjacent to C6-C7 on the previous scan but the current focus is clearly larger. The focus adjacent to T1 was not evident on the previous MRI. Neither of these 2 foci enhanced with contrast.         There is mild spinal stenosis at C4-C5, C5-C6 and C6-C7, unchanged when compared to the previous MRI. There is mild right foraminal narrowing at C6-C7 but there is no nerve root compression.  MRI 01/10/2018 of the brain showed multiple T2/FLAIR hyperintense foci in the brainstem and in the periventricular, juxtacortical and deep white matter in a pattern and configuration consistent with chronic demyelinating plaque associated with multiple sclerosis.  Compared to the MRI dated 11/28/2016, there is an improved appearance in the lesions that were enhancing at that time no longer do so.  There are  no new lesions.  MRI [facility] 01/27/2018 showed, at L4-L5, there is stable mild left facet hypertrophy and mild ligamentum flavum hypertrophy.  There is no nerve root compression.     At L5-S1, there is a stable small central disc herniation causing bilateral lateral recess narrowing.  This contacts both of the S1 nerve roots.  There is no definite change when compared to the previous MRI.  MRI brain 01/17/2019 showed multiple T2/flair hyperintense foci in the hemispheres in a pattern and configuration consistent with chronic demyelinating plaque associated with multiple sclerosis.  Two foci that have been seen previously in the brainstem are not apparent on the current MRI.  There are no new lesions compared to the 2019 MRI.  None of the foci enhance.    There is a small developmental venous anomaly in the right parietal lobe that is unchanged  in appearance.  Otherwise, the enhancement pattern is normal.  MRI of the brain 01/20/2020 showed scattered T2/FLAIR hyperintense foci in the hemispheres and medulla in a pattern and configuration consistent with chronic demyelinating plaque associated with multiple sclerosis.   Compared to the MRI from 01/17/2019, there are no new lesions.  MRI of the brain 6/112023 showed  T2/FLAIR hyperintense foci in the cerebral hemispheres and the left medulla in a pattern consistent with chronic demyelinating plaque associated with multiple sclerosis.  None of the foci appear to be acute.  Compared to the MRI dated 01/20/2020, there are no new lesions.    REVIEW OF SYSTEMS: Constitutional: No fevers, chills, sweats, or change in appetite.   She has fatigue. Insomnia much better with temazepam Eyes: No visual changes, double vision, eye pain Ear, nose and throat: No hearing loss, ear pain, nasal congestion, sore throat Cardiovascular: No chest pain, palpitations Respiratory:  No shortness of breath at rest or with exertion.   No wheezes GastrointestinaI: No nausea, vomiting,  diarrhea, abdominal pain, fecal incontinence Genitourinary:  No dysuria, urinary retention.   She reports frequency.  No nocturia. Musculoskeletal:  No neck pain, back pain Integumentary: No rash, pruritus, skin lesions Neurological: as above Psychiatric: No depression at this time.  No anxiety Endocrine: No palpitations, diaphoresis, change in appetite, change in weigh or increased thirst Hematologic/Lymphatic:  No anemia, purpura, petechiae. Allergic/Immunologic: No itchy/runny eyes, nasal congestion, recent allergic reactions, rashes  ALLERGIES: Allergies  Allergen Reactions   Codeine Other (See Comments)    Hallucinations    Onion     swelling   Topamax [Topiramate]     Was talking out of her head, walked into walls    HOME MEDICATIONS:  Current Outpatient Medications:    AIMOVIG 140 MG/ML SOAJ, INJECT 140 MG INTO THE SKIN EVERY 30 (THIRTY) DAYS, Disp: 1 mL, Rfl: 5   amitriptyline (ELAVIL) 50 MG tablet, TAKE 1 TABLET BY MOUTH EVERY DAY AT BEDTIME AS NEEDED FOR SLEEP, Disp: 90 tablet, Rfl: 3   atenolol (TENORMIN) 50 MG tablet, TAKE 1 TABLET BY MOUTH EVERY DAY, Disp: 90 tablet, Rfl: 1   baclofen (LIORESAL) 10 MG tablet, TAKE 2TABLETS BY MOUTH 3 TIMES A DAY FOR SPASTICITY, Disp: 540 tablet, Rfl: 3   buPROPion (WELLBUTRIN XL) 150 MG 24 hr tablet, Take 1 tablet (150 mg total) by mouth daily., Disp: 30 tablet, Rfl: 5   clonazePAM (KLONOPIN) 1 MG tablet, TAKE 1 TABLET BY MOUTH TWICE A DAY AS NEEDED FOR ANXIETY, Disp: 20 tablet, Rfl: 1   DULoxetine (CYMBALTA) 60 MG capsule, TAKE 1 CAPSULE BY MOUTH EVERY DAY, Disp: 90 capsule, Rfl: 2   etodolac (LODINE) 400 MG tablet, Take 1 tablet (400 mg total) by mouth 2 (two) times daily., Disp: 60 tablet, Rfl: 5   gabapentin (NEURONTIN) 600 MG tablet, Take 1 tablet (600 mg total) by mouth 4 (four) times daily., Disp: 120 tablet, Rfl: 11   HAILEY 24 FE 1-20 MG-MCG(24) tablet, TAKE 1 TABLET BY MOUTH EVERY DAY, Disp: 84 tablet, Rfl: 3   hyoscyamine  (LEVSIN SL) 0.125 MG SL tablet, Place 1 tablet (0.125 mg total) under the tongue every 6 (six) hours as needed., Disp: 30 tablet, Rfl: 2   meclizine (ANTIVERT) 25 MG tablet, Take 1 tablet (25 mg total) by mouth 3 (three) times daily as needed for dizziness., Disp: 30 tablet, Rfl: 11   metroNIDAZOLE (FLAGYL) 250 MG tablet, Take 1 tablet (250 mg total) by mouth 3 (three) times daily., Disp:  42 tablet, Rfl: 0   pantoprazole (PROTONIX) 40 MG tablet, TAKE 1 TABLET BY MOUTH TWICE A DAY, Disp: 180 tablet, Rfl: 0   sucralfate (CARAFATE) 1 g tablet, TAKE 1 TABLET BY MOUTH 4 TIMES DAILY - WITH MEALS AND AT BEDTIME. (Patient taking differently: as needed.), Disp: 120 tablet, Rfl: 1   SUMAtriptan (IMITREX) 50 MG tablet, TAKE 1 TABLET EVERY 2 HRS AS NEEDED FOR MIGRAINE. MAY REPEAT IN 2 HRS IF HEADACHE PERSISTS OR RECURS, Disp: 10 tablet, Rfl: 5   temazepam (RESTORIL) 30 MG capsule, TAKE 1 CAPSULE AT BEDTIME AS NEEDED FOR SLEEP (DO NOT TAKE WITH KLONOPIN)., Disp: 15 capsule, Rfl: 3  PAST MEDICAL HISTORY: Past Medical History:  Diagnosis Date   Anxiety    GERD (gastroesophageal reflux disease)    Headache    Insomnia    Multiple sclerosis ([place])    dx 2017   Neuromuscular disorder ([place])    Phreesia 06/30/2020   Nystagmus 02/15/2017   PAC (premature atrial contraction)    PVC (premature ventricular contraction)    Syncope and collapse    Tachycardia     PAST SURGICAL HISTORY: Past Surgical History:  Procedure Laterality Date   LEEP N/A 09/20/2021   Procedure: LOOP ELECTROSURGICAL EXCISION PROCEDURE (LEEP);  Surgeon: Gae Dry, MD;  Location: [facility] ORS;  Service: Gynecology;  Laterality: N/A;    FAMILY HISTORY: Family History  Problem Relation Age of Onset   Emphysema Mother    Osteoarthritis Mother    Anxiety disorder Mother    Uterine cancer Mother 61   Other Father    Suicidality Father    Breast cancer Maternal Aunt    Breast cancer Maternal Aunt    Breast cancer Maternal Aunt     ALS Maternal Grandmother    Colon cancer Neg Hx    Colon polyps Neg Hx    Esophageal cancer Neg Hx    Rectal cancer Neg Hx    Stomach cancer Neg Hx     SOCIAL HISTORY:  Social History   Socioeconomic History   Marital status: Married    Spouse name: Not on file   Number of children: Not on file   Years of education: Not on file   Highest education level: Not on file  Occupational History   Not on file  Tobacco Use   Smoking status: Former    Packs/day: 0.50    Years: 10.00    Total pack years: 5.00    Types: Cigarettes    Quit date: 2015    Years since quitting: 9.1   Smokeless tobacco: Never  Vaping Use   Vaping Use: Every day   Substances: Nicotine, Flavoring  Substance and Sexual Activity   Alcohol use: Yes    Alcohol/week: 0.0 standard drinks of alcohol    Comment: rarely   Drug use: No   Sexual activity: Not Currently    Birth control/protection: Pill  Other Topics Concern   Not on file  Social History Narrative   Not on file   Social Determinants of Health   Financial Resource Strain: Not on file  Food Insecurity: Not on file  Transportation Needs: Not on file  Physical Activity: Not on file  Stress: Not on file  Social Connections: Not on file  Intimate Partner Violence: Not on file     PHYSICAL EXAM  Vitals:   01/04/23 1516  BP: 112/77  Pulse: 78  Weight: 200 lb 3.2 oz (90.8 kg)  Height: '5\' 4"'$  (1.626 m)  Body mass index is 34.36 kg/m.   General: The patient is well-developed and well-nourished and in no acute distress,.  She has mild pedal edema.  The knees are not erythematous or hot with normal range of motion  Neurologic Exam  Mental status: The patient is alert and oriented x 3 at the time of the examination. The patient has apparent normal recent and remote memory, with an apparently normal attention span and concentration ability.   Speech is normal.  Cranial nerves: Extraocular muscles are full.  No nystagmus today..  Facial  strength and sensation is normal. Trapezius strength is normal. No obvious hearing deficits are noted.  Motor:  Muscle bulk is normal.  Strength is 5/5. Tone is mildly increased in the legs.  Sensory: She had intact sensation to touch and vibration in arms but reduced sensation to touch/temp and vibration in right leg.    Coordination: Cerebellar testing reveals good finger-nose-finger and heel-to-shin bilaterally.  Gait and station: Station is normal.   Mildly arthritic gait  Tandem gait is mildly wide  Romberg is negative.  Reflexes: Deep tendon reflexes are symmetric and normal in arms.  DTRs are brisk at knees with spread but no ankle clonus.    DIAGNOSTIC DATA (LABS, IMAGING, TESTING) - I reviewed patient records, labs, notes, testing and imaging myself where available.  Lab Results  Component Value Date   WBC 7.9 01/03/2023   HGB 14.5 01/03/2023   HCT 42.7 01/03/2023   MCV 100 (H) 01/03/2023   PLT 296 01/03/2023      Component Value Date/Time   NA 140 07/25/2022 1558   K 3.7 07/25/2022 1558   CL 104 07/25/2022 1558   CO2 21 07/25/2022 1558   GLUCOSE 104 (H) 07/25/2022 1558   GLUCOSE 120 (H) 10/03/2021 1645   BUN 12 07/25/2022 1558   CREATININE 0.89 07/25/2022 1558   CREATININE 0.91 10/03/2021 1645   CALCIUM 8.9 07/25/2022 1558   PROT 5.7 (L) 07/25/2022 1558   ALBUMIN 3.7 (L) 07/25/2022 1558   AST 15 07/25/2022 1558   ALT 34 (H) 07/25/2022 1558   ALKPHOS 77 07/25/2022 1558   BILITOT <0.2 07/25/2022 1558   GFRNONAA >60 09/27/2021 1656   GFRNONAA 80 08/24/2020 1643   GFRAA 93 08/24/2020 1643    Lab Results  Component Value Date   TSH 1.280 07/25/2022       ASSESSMENT AND PLAN    1. Multiple sclerosis ([place])   2. High risk medication use   3. Dysesthesia   4. Chronic migraine w/o aura w/o status migrainosus, not intractable   5. Other fatigue       1.  continue Tysabri.  JCV antibody is pending.   2.   Continue baclofen up to 20 mg po tid  but try  lower daytime dose as noting fatigue/sleepiness 3.   Continue gabapentin 600 mg 3-4 times a day.  If dysesthesias worsen, consider lamotrigine.    4.   For headaches, continue amitriptyline 50 mg nightly.   Sumatriptan for breakthrough. 5.   Bupropion XL 150 mg - may help fatigue Return 5-6 months.  Call sooner if there are new or worsening neurologic symptoms.  Treesa Mccully A. Felecia Shelling, MD, PhD [id number], [id number] PM Certified in Neurology, Clinical Neurophysiology, Sleep Medicine, Pain Medicine and Neuroimaging  [facility] [address], [place], [place] [address] [phone]

## 2023-01-08 NOTE — Telephone Encounter (Signed)
JCV ab drawn on 01/03/23 positive, index: 1.22. Gave to MD for review.

## 2023-01-10 ENCOUNTER — Other Ambulatory Visit: Payer: Self-pay | Admitting: Neurology

## 2023-01-10 ENCOUNTER — Telehealth: Payer: Self-pay | Admitting: Neurology

## 2023-01-10 MED ORDER — KESIMPTA 20 MG/0.4ML ~~LOC~~ SOAJ: 20.0000 mg | SUBCUTANEOUS | 4 refills | Status: DC

## 2023-01-10 NOTE — Telephone Encounter (Signed)
I spoke to [place] about the JCV antibody.  Unfortunately, it returned middle positive (1.22) per previous JCV antibody in August 2023 was negative.  She had just gone back onto Tysabri (was on Kesimpta for about 8 months) and has only had 2 infusions.  I discussed that I generally do not like people who are positive on the test to stay on the drug for more than a year.  We discussed options and she would like to switch back to [place].

## 2023-01-11 DIAGNOSIS — K644 Residual hemorrhoidal skin tags: Secondary | ICD-10-CM | POA: Diagnosis not present

## 2023-01-16 ENCOUNTER — Telehealth: Payer: Self-pay | Admitting: Neurology

## 2023-01-16 NOTE — Telephone Encounter (Signed)
Amy is calling from [facility] Stated she needs to talk to nurse to see if pt is on maintenance dose and to check the  quality.

## 2023-01-16 NOTE — Telephone Encounter (Signed)
[facility] and spoke with [facility] pharmacist. [facility] wanted [facility] make sure that pt is restarting medication, based on 01/10/23 telephone encounter pt wanted to switch back to [place]. Pharmacist informed of this.

## 2023-01-22 NOTE — Telephone Encounter (Signed)
Completed PA and sent to [facility]. It likely will need additional information received by fax.

## 2023-01-22 NOTE — Telephone Encounter (Signed)
Initiated PA for [employer] via [place]. Sent to [facility]. Key: [id number]. Received this notification: "[facility] is processing your PA request and will respond shortly with next steps. You are currently using the fastest method to process this prior authorization. Please do not fax or call [facility] to resubmit this request. To check for an update later, open this request again from your dashboard."

## 2023-01-22 NOTE — Telephone Encounter (Signed)
Dr. Felecia Shelling [facility] [place] 01/10/23. Received fax from [facility] that PA required.

## 2023-01-23 NOTE — Telephone Encounter (Signed)
Received fax to complete PA for Kesimpta. I completed and faxed back to [phone]. Received fax confirmation, waiting on determination.

## 2023-01-23 NOTE — Telephone Encounter (Signed)
Kristy call from [place]. Stated pt need PA for Kesimpta.

## 2023-01-23 NOTE — Telephone Encounter (Signed)
This is already being worked on. See other phone note.

## 2023-01-24 NOTE — Telephone Encounter (Signed)
PA approved 01/23/23-01/23/24. PA#Charter ID:[id number]

## 2023-01-26 ENCOUNTER — Encounter: Payer: Self-pay | Admitting: Gastroenterology

## 2023-01-26 ENCOUNTER — Other Ambulatory Visit: Payer: Self-pay | Admitting: Family Medicine

## 2023-01-26 DIAGNOSIS — F411 Generalized anxiety disorder: Secondary | ICD-10-CM

## 2023-01-26 NOTE — Telephone Encounter (Signed)
Requested medication (s) are due for refill today: yes  Requested medication (s) are on the active medication list: yes  Last refill:  11/23/22  Future visit scheduled: yes  Notes to clinic:  Unable to refill per protocol, cannot delegate.      Requested Prescriptions  Pending Prescriptions Disp Refills   temazepam (RESTORIL) 30 MG capsule [Pharmacy Med Name: TEMAZEPAM 30 MG CAPSULE] 15 capsule 3    Sig: TAKE 1 CAPSULE AT BEDTIME AS NEEDED FOR SLEEP (DO NOT TAKE WITH KLONOPIN).     Not Delegated - Psychiatry: Anxiolytics/Hypnotics 2 Failed - 01/26/2023  3:55 PM      Failed - This refill cannot be delegated      Failed - Urine Drug Screen completed in last 360 days      Failed - Valid encounter within last 6 months    Recent Outpatient Visits           1 year ago Pelvic pain   [place] Dennard Schaumann, Cammie Mcgee, MD   1 year ago Viral upper respiratory tract infection   [place] Pickard, Cammie Mcgee, MD   1 year ago Elevated serum creatinine   [place] Pickard, Cammie Mcgee, MD   1 year ago Cervical radiculopathy   [place] Dennard Schaumann, Cammie Mcgee, MD   1 year ago Diarrhea, unspecified type   [facility] Eulogio Bear, NP              Passed - Patient is not pregnant       clonazePAM (KLONOPIN) 1 MG tablet [Pharmacy Med Name: CLONAZEPAM 1 MG TABLET] 20 tablet 1    Sig: TAKE 1 TABLET BY MOUTH TWICE A DAY AS NEEDED FOR ANXIETY     Not Delegated - Psychiatry: Anxiolytics/Hypnotics 2 Failed - 01/26/2023  3:55 PM      Failed - This refill cannot be delegated      Failed - Urine Drug Screen completed in last 360 days      Failed - Valid encounter within last 6 months    Recent Outpatient Visits           1 year ago Pelvic pain   [place] Susy Frizzle, MD   1 year ago Viral upper respiratory tract infection   [place] Pickard, Cammie Mcgee, MD    1 year ago Elevated serum creatinine   [place] Pickard, Cammie Mcgee, MD   1 year ago Cervical radiculopathy   [place] Denn[facility] Schaumann Cammie Mcgee, MD   1 year ago Diarrhea, unspecified type   [place], Hartley, NP              Passed - Patient is not pregnant

## 2023-01-29 ENCOUNTER — Ambulatory Visit: Payer: Self-pay | Admitting: Surgery

## 2023-01-31 ENCOUNTER — Encounter (HOSPITAL_BASED_OUTPATIENT_CLINIC_OR_DEPARTMENT_OTHER): Payer: Self-pay | Admitting: Surgery

## 2023-02-01 ENCOUNTER — Other Ambulatory Visit: Payer: Self-pay | Admitting: Neurology

## 2023-02-01 ENCOUNTER — Telehealth: Payer: Self-pay

## 2023-02-01 ENCOUNTER — Other Ambulatory Visit: Payer: Self-pay

## 2023-02-01 ENCOUNTER — Encounter (HOSPITAL_BASED_OUTPATIENT_CLINIC_OR_DEPARTMENT_OTHER): Payer: Self-pay | Admitting: Surgery

## 2023-02-01 DIAGNOSIS — F410 Panic disorder [episodic paroxysmal anxiety] without agoraphobia: Secondary | ICD-10-CM

## 2023-02-01 DIAGNOSIS — F320 Major depressive disorder, single episode, mild: Secondary | ICD-10-CM

## 2023-02-01 MED ORDER — DULOXETINE HCL 60 MG PO CPEP
60.0000 mg | ORAL_CAPSULE | Freq: Every day | ORAL | 0 refills | Status: DC
Start: 2023-02-01 — End: 2023-04-30

## 2023-02-01 NOTE — Progress Notes (Signed)
Spoke w/ via phone for pre-op interview--- pt Lab needs dos----    urine preg, ordered EKG but ask mda if needed           Lab results------ no COVID test -----patient states asymptomatic no test needed Arrive at ------- 0830 on 02-08-2023 NPO after MN with exception sips water meds Med rec completed Medications to take morning of surgery ----- wellbutrin, gabapentin, cymbalta, baclofen, protonix Diabetic medication ----- n/a Patient instructed no nail polish to be worn day of surgery Patient instructed to bring photo id and insurance card day of surgery Patient aware to have Driver (ride ) / caregiver    for 24 hours after surgery -- son, joseph Patient Special Instructions ----- pt verbalized understanding per dr white orders do one fleet enema night before surgery and one fleet enema morning of surgery Pre-Op special Istructions ----- n/a Patient verbalized understanding of instructions that were given at this phone interview. Patient denies shortness of breath, chest pain, fever, cough at this phone interview.

## 2023-02-01 NOTE — Telephone Encounter (Signed)
Prescription Request  02/01/2023  LOV: 07/21/22  What is the name of the medication or equipment? DULoxetine (CYMBALTA) 60 MG capsule [id number]  Have you contacted your pharmacy to request a refill? Yes   Which pharmacy would you like this sent to?  [id number] [place] [place] [address] [place] [address] Phone: [phone] Fax: [phone]    Patient notified that their request is being sent to the clinical staff for review and that they should receive a response within 2 business days.   Please advise at Mobile [phone] (mobile)

## 2023-02-08 ENCOUNTER — Encounter (HOSPITAL_BASED_OUTPATIENT_CLINIC_OR_DEPARTMENT_OTHER): Payer: Self-pay | Admitting: Surgery

## 2023-02-08 ENCOUNTER — Ambulatory Visit (HOSPITAL_BASED_OUTPATIENT_CLINIC_OR_DEPARTMENT_OTHER): Payer: BC Managed Care – PPO | Admitting: Anesthesiology

## 2023-02-08 ENCOUNTER — Other Ambulatory Visit: Payer: Self-pay

## 2023-02-08 ENCOUNTER — Ambulatory Visit (HOSPITAL_BASED_OUTPATIENT_CLINIC_OR_DEPARTMENT_OTHER)
Admission: RE | Admit: 2023-02-08 | Discharge: 2023-02-08 | Disposition: A | Discharge status: Home / Self Care | Payer: BC Managed Care – PPO | Attending: Surgery | Admitting: Surgery

## 2023-02-08 ENCOUNTER — Encounter (HOSPITAL_BASED_OUTPATIENT_CLINIC_OR_DEPARTMENT_OTHER)
Admission: RE | Disposition: A | Discharge status: Home / Self Care | Payer: Self-pay | Source: Home / Self Care | Attending: Surgery

## 2023-02-08 DIAGNOSIS — G35 Multiple sclerosis: Secondary | ICD-10-CM | POA: Insufficient documentation

## 2023-02-08 DIAGNOSIS — F419 Anxiety disorder, unspecified: Secondary | ICD-10-CM | POA: Insufficient documentation

## 2023-02-08 DIAGNOSIS — F32A Depression, unspecified: Secondary | ICD-10-CM | POA: Insufficient documentation

## 2023-02-08 DIAGNOSIS — R519 Headache, unspecified: Secondary | ICD-10-CM | POA: Diagnosis not present

## 2023-02-08 DIAGNOSIS — K644 Residual hemorrhoidal skin tags: Secondary | ICD-10-CM | POA: Diagnosis not present

## 2023-02-08 DIAGNOSIS — M1990 Unspecified osteoarthritis, unspecified site: Secondary | ICD-10-CM | POA: Diagnosis not present

## 2023-02-08 DIAGNOSIS — Z6834 Body mass index (BMI) 34.0-34.9, adult: Secondary | ICD-10-CM | POA: Diagnosis not present

## 2023-02-08 DIAGNOSIS — G4730 Sleep apnea, unspecified: Secondary | ICD-10-CM | POA: Diagnosis not present

## 2023-02-08 DIAGNOSIS — Z87891 Personal history of nicotine dependence: Secondary | ICD-10-CM | POA: Insufficient documentation

## 2023-02-08 DIAGNOSIS — K219 Gastro-esophageal reflux disease without esophagitis: Secondary | ICD-10-CM | POA: Insufficient documentation

## 2023-02-08 DIAGNOSIS — Z01818 Encounter for other preprocedural examination: Secondary | ICD-10-CM

## 2023-02-08 DIAGNOSIS — F418 Other specified anxiety disorders: Secondary | ICD-10-CM | POA: Diagnosis not present

## 2023-02-08 HISTORY — DX: Sleep apnea, unspecified: G47.30

## 2023-02-08 HISTORY — DX: Chronic migraine without aura, not intractable, without status migrainosus: G43.709

## 2023-02-08 HISTORY — DX: Unspecified osteoarthritis, unspecified site: M19.90

## 2023-02-08 HISTORY — DX: Generalized anxiety disorder: F41.1

## 2023-02-08 HISTORY — DX: Personal history of cervical dysplasia: Z87.410

## 2023-02-08 HISTORY — PX: EVALUATION UNDER ANESTHESIA WITH HEMORRHOIDECTOMY: SHX5624

## 2023-02-08 HISTORY — PX: HEMORRHOID SURGERY: SHX153

## 2023-02-08 HISTORY — DX: Unspecified hemorrhoids: K64.9

## 2023-02-08 HISTORY — DX: Primary central sleep apnea: G47.31

## 2023-02-08 LAB — POCT PREGNANCY, URINE: Preg Test, Ur: NEGATIVE

## 2023-02-08 SURGERY — EXAM UNDER ANESTHESIA WITH HEMORRHOIDECTOMY
Anesthesia: General | Site: Rectum

## 2023-02-08 MED ORDER — 0.9 % SODIUM CHLORIDE (POUR BTL) OPTIME
TOPICAL | Status: DC | PRN
  Administered 2023-02-08: 500 mL

## 2023-02-08 MED ORDER — LACTATED RINGERS IV SOLN: INTRAVENOUS | Status: DC

## 2023-02-08 MED ORDER — HYDROMORPHONE HCL 1 MG/ML IJ SOLN
0.2500 mg | INTRAMUSCULAR | Status: DC | PRN
  Administered 2023-02-08: 0.25 mg via INTRAVENOUS

## 2023-02-08 MED ORDER — KETOROLAC TROMETHAMINE 30 MG/ML IJ SOLN
INTRAMUSCULAR | Status: AC
  Filled 2023-02-08: qty 1

## 2023-02-08 MED ORDER — FENTANYL CITRATE (PF) 100 MCG/2ML IJ SOLN
INTRAMUSCULAR | Status: DC | PRN
  Administered 2023-02-08: 25 ug via INTRAVENOUS

## 2023-02-08 MED ORDER — OXYCODONE HCL 5 MG PO TABS: 5.0000 mg | ORAL_TABLET | Freq: Four times a day (QID) | ORAL | 0 refills | Status: AC | PRN

## 2023-02-08 MED ORDER — SODIUM CHLORIDE 0.9 % IV SOLN
INTRAVENOUS | Status: AC
  Filled 2023-02-08: qty 2

## 2023-02-08 MED ORDER — DEXAMETHASONE SODIUM PHOSPHATE 10 MG/ML IJ SOLN
INTRAMUSCULAR | Status: AC
  Filled 2023-02-08: qty 1

## 2023-02-08 MED ORDER — KETOROLAC TROMETHAMINE 30 MG/ML IJ SOLN
INTRAMUSCULAR | Status: DC | PRN
  Administered 2023-02-08: 30 mg via INTRAVENOUS

## 2023-02-08 MED ORDER — BUPIVACAINE LIPOSOME 1.3 % IJ SUSP: 20.0000 mL | Freq: Once | INTRAMUSCULAR | Status: DC

## 2023-02-08 MED ORDER — OXYCODONE HCL 5 MG PO TABS: 5.0000 mg | ORAL_TABLET | Freq: Once | ORAL | Status: DC | PRN

## 2023-02-08 MED ORDER — ONDANSETRON HCL 4 MG/2ML IJ SOLN
INTRAMUSCULAR | Status: AC
  Filled 2023-02-08: qty 2

## 2023-02-08 MED ORDER — FENTANYL CITRATE (PF) 100 MCG/2ML IJ SOLN
INTRAMUSCULAR | Status: AC
  Filled 2023-02-08: qty 2

## 2023-02-08 MED ORDER — MIDAZOLAM HCL 2 MG/2ML IJ SOLN
INTRAMUSCULAR | Status: AC
  Filled 2023-02-08: qty 2

## 2023-02-08 MED ORDER — DEXAMETHASONE SODIUM PHOSPHATE 10 MG/ML IJ SOLN
INTRAMUSCULAR | Status: DC | PRN
  Administered 2023-02-08: 10 mg via INTRAVENOUS

## 2023-02-08 MED ORDER — LIDOCAINE 2% (20 MG/ML) 5 ML SYRINGE
INTRAMUSCULAR | Status: DC | PRN
  Administered 2023-02-08: 40 mg via INTRAVENOUS

## 2023-02-08 MED ORDER — MEPERIDINE HCL 25 MG/ML IJ SOLN: 6.2500 mg | INTRAMUSCULAR | Status: DC | PRN

## 2023-02-08 MED ORDER — PROMETHAZINE HCL 25 MG/ML IJ SOLN: 6.2500 mg | INTRAMUSCULAR | Status: DC | PRN

## 2023-02-08 MED ORDER — BUPIVACAINE LIPOSOME 1.3 % IJ SUSP
INTRAMUSCULAR | Status: DC | PRN
  Administered 2023-02-08: 50 mL

## 2023-02-08 MED ORDER — MIDAZOLAM HCL 5 MG/5ML IJ SOLN
INTRAMUSCULAR | Status: DC | PRN
  Administered 2023-02-08: 2 mg via INTRAVENOUS

## 2023-02-08 MED ORDER — ACETAMINOPHEN 500 MG PO TABS
ORAL_TABLET | ORAL | Status: AC
  Filled 2023-02-08: qty 2

## 2023-02-08 MED ORDER — ONDANSETRON HCL 4 MG/2ML IJ SOLN
INTRAMUSCULAR | Status: DC | PRN
  Administered 2023-02-08: 4 mg via INTRAVENOUS

## 2023-02-08 MED ORDER — LIDOCAINE HCL (PF) 2 % IJ SOLN
INTRAMUSCULAR | Status: AC
  Filled 2023-02-08: qty 5

## 2023-02-08 MED ORDER — PROPOFOL 10 MG/ML IV BOLUS
INTRAVENOUS | Status: AC
  Filled 2023-02-08: qty 20

## 2023-02-08 MED ORDER — HYDROMORPHONE HCL 1 MG/ML IJ SOLN
INTRAMUSCULAR | Status: AC
  Filled 2023-02-08: qty 1

## 2023-02-08 MED ORDER — ACETAMINOPHEN 500 MG PO TABS
1000.0000 mg | ORAL_TABLET | ORAL | Status: AC
  Administered 2023-02-08: 1000 mg via ORAL

## 2023-02-08 MED ORDER — ROCURONIUM BROMIDE 10 MG/ML (PF) SYRINGE
PREFILLED_SYRINGE | INTRAVENOUS | Status: AC
  Filled 2023-02-08: qty 10

## 2023-02-08 MED ORDER — OXYCODONE HCL 5 MG/5ML PO SOLN: 5.0000 mg | Freq: Once | ORAL | Status: DC | PRN

## 2023-02-08 MED ORDER — PROPOFOL 10 MG/ML IV BOLUS
INTRAVENOUS | Status: DC | PRN
  Administered 2023-02-08: 50 mg via INTRAVENOUS
  Administered 2023-02-08: 200 mg via INTRAVENOUS
  Administered 2023-02-08: 50 mg via INTRAVENOUS

## 2023-02-08 MED ORDER — AMISULPRIDE (ANTIEMETIC) 5 MG/2ML IV SOLN: 10.0000 mg | Freq: Once | INTRAVENOUS | Status: DC | PRN

## 2023-02-08 SURGICAL SUPPLY — 50 items
BLADE EXTENDED COATED 6.5IN (ELECTRODE) ×2 IMPLANT
BLADE SURG 15 STRL LF DISP TIS (BLADE) IMPLANT
BLADE SURG 15 STRL SS (BLADE)
BRIEF MESH DISP LRG (UNDERPADS AND DIAPERS) ×2 IMPLANT
COVER BACK TABLE 60X90IN (DRAPES) ×2 IMPLANT
COVER MAYO STAND STRL (DRAPES) ×2 IMPLANT
DRAPE LAPAROTOMY 100X72 PEDS (DRAPES) ×2 IMPLANT
DRAPE UTILITY XL STRL (DRAPES) ×2 IMPLANT
ELECT REM PT RETURN 9FT ADLT (ELECTROSURGICAL) ×2
ELECTRODE REM PT RTRN 9FT ADLT (ELECTROSURGICAL) ×2 IMPLANT
GAUZE 4X4 16PLY ~~LOC~~+RFID DBL (SPONGE) ×2 IMPLANT
GAUZE PAD ABD 8X10 STRL (GAUZE/BANDAGES/DRESSINGS) IMPLANT
GAUZE SPONGE 4X4 12PLY STRL (GAUZE/BANDAGES/DRESSINGS) ×2 IMPLANT
GLOVE BIO SURGEON STRL SZ7.5 (GLOVE) ×2 IMPLANT
GLOVE BIOGEL PI IND STRL 6 (GLOVE) IMPLANT
GLOVE BIOGEL PI IND STRL 6.5 (GLOVE) IMPLANT
GLOVE BIOGEL PI IND STRL 7.0 (GLOVE) IMPLANT
GLOVE ECLIPSE 6.5 STRL STRAW (GLOVE) IMPLANT
GLOVE ECLIPSE 7.0 STRL STRAW (GLOVE) IMPLANT
GLOVE INDICATOR 8.0 STRL GRN (GLOVE) ×2 IMPLANT
GOWN STRL REUS W/TWL LRG LVL3 (GOWN DISPOSABLE) IMPLANT
GOWN STRL REUS W/TWL XL LVL3 (GOWN DISPOSABLE) ×2 IMPLANT
KIT SIGMOIDOSCOPE (SET/KITS/TRAYS/PACK) IMPLANT
KIT TURNOVER CYSTO (KITS) ×2 IMPLANT
LIGASURE 7.4 SM JAW OPEN (ELECTROSURGICAL) IMPLANT
LOOP VASCLR MAXI BLUE 18IN ST (MISCELLANEOUS) IMPLANT
LOOP VASCULAR MAXI 18 BLUE (MISCELLANEOUS)
LOOPS VASCLR MAXI BLUE 18IN ST (MISCELLANEOUS) IMPLANT
NDL HYPO 22X1.5 SAFETY MO (MISCELLANEOUS) ×2 IMPLANT
NEEDLE HYPO 22X1.5 SAFETY MO (MISCELLANEOUS) ×2 IMPLANT
NS IRRIG 500ML POUR BTL (IV SOLUTION) ×2 IMPLANT
PACK BASIN DAY SURGERY FS (CUSTOM PROCEDURE TRAY) ×2 IMPLANT
PAD ARMBOARD 7.5X6 YLW CONV (MISCELLANEOUS) IMPLANT
PENCIL SMOKE EVACUATOR (MISCELLANEOUS) ×2 IMPLANT
SLEEVE SCD COMPRESS KNEE MED (STOCKING) ×2 IMPLANT
SPONGE HEMORRHOID 8X3CM (HEMOSTASIS) IMPLANT
SPONGE SURGIFOAM ABS GEL 100 (HEMOSTASIS) IMPLANT
SPONGE SURGIFOAM ABS GEL 12-7 (HEMOSTASIS) IMPLANT
SUT CHROMIC 3 0 SH 27 (SUTURE) IMPLANT
SUT VIC AB 2-0 UR6 27 (SUTURE) ×2 IMPLANT
SUT VIC AB 3-0 SH 27 (SUTURE) ×2
SUT VIC AB 3-0 SH 27X BRD (SUTURE) ×2 IMPLANT
SYR BULB EAR ULCER 3OZ GRN STR (SYRINGE) IMPLANT
SYR CONTROL 10ML LL (SYRINGE) ×2 IMPLANT
TOWEL OR 17X24 6PK STRL BLUE (TOWEL DISPOSABLE) ×2 IMPLANT
TRAY DSU PREP LF (CUSTOM PROCEDURE TRAY) ×2 IMPLANT
TUBE CONNECTING 12X1/4 (SUCTIONS) ×2 IMPLANT
VASCULAR TIE MAXI BLUE 18IN ST (MISCELLANEOUS)
WATER STERILE IRR 500ML POUR (IV SOLUTION) IMPLANT
YANKAUER SUCT BULB TIP NO VENT (SUCTIONS) ×2 IMPLANT

## 2023-02-08 NOTE — Anesthesia Procedure Notes (Signed)
Procedure Name: LMA Insertion Date/Time: 02/08/2023 10:50 AM  Performed by: Briant Sites, CRNAPre-anesthesia Checklist: Patient identified, Emergency Drugs available, Suction available and Patient being monitored Patient Re-evaluated:Patient Re-evaluated prior to induction Oxygen Delivery Method: Circle system utilized Preoxygenation: Pre-oxygenation with 100% oxygen Induction Type: IV induction Ventilation: Mask ventilation without difficulty LMA: LMA inserted LMA Size: 4.0 Number of attempts: 1 Airway Equipment and Method: Bite block Placement Confirmation: positive ETCO2 Tube secured with: Tape Dental Injury: Teeth and Oropharynx as per pre-operative assessment

## 2023-02-08 NOTE — H&P (Addendum)
CC: Here today for surgery  HPI: Virginia [name] is an 42 y.o. female with history of Multiple Sclerosis (starting KESIMPTA), whom is seen in the office today as a referral by Dr. Myrtie Neither for evaluation of perianal skin tags.   Colonoscopy 11/15/22 -  - The examined portion of the ileum was normal. - Normal mucosa in the entire examined colon. Biopsied. - Internal hemorrhoids. - The examination was otherwise normal on direct and retroflexion views  She reports a 2 to 3-year history of perianal skin tags. These are external around the anal opening. Some bright red blood per rectum. They have caused her discomfort particular with bowel movements, wiping. She has a fair amount of discomfort when wiping. Also having hygiene related issues.  She reports she has on average 2 soft bowel movements per day, no straining. She drinks in excess of 64 ounces of water per day and minimizes her commode times of 2 to 3 minutes. Despite these maneuvers, has still had issues with these now large tags.  No prior anorectal surgeries or procedures.  Denies any changes in her health or health hx since we met in the office. States she is ready for surgery   PMH: Multiple Sclerosis (starting KESIMPTA)  PSH: Denies any prior anorectal surgeries or procedures. Remote LEEP procedure 09/20/2021.  FHx: Denies any known family history of colorectal, breast, endometrial or ovarian cancer  Social Hx: Denies use of tobacco/EtOH/illicit drug. She reports that she is not currently working.   Past Medical History:  Diagnosis Date   Arthritis    Chronic migraine w/o aura w/o status migrainosus, not intractable    followed by [name]   GAD (generalized anxiety disorder)    GERD (gastroesophageal reflux disease)    Hemorrhoids    History of cervical dysplasia    followed by GYN--- [name]. harris;    hx CIN 2  s/p  colposcopy in office 05/ 2022 and CIN 3  s/p LEEP 11/ 2022   History of syncope 02/2016   admission in  epic;  in setting of vertigo and dx w/ MS   Insomnia    Multiple sclerosis, relapsing-remitting 02/2016   neurologist--- [name];  dx 05/ 2017 lumbar puncture,  dyesthesia, fatigue, vertigo   PAC (premature atrial contraction)    PSVT (paroxysmal supraventricular tachycardia) 2019   (02-01-2023 pt stated followed by pcp) // cardiologist--- dr g. taylor;  (lov in epic 06-21-2018)  evaluated 2019 after dx MS in 2017 with syncope/ palpitations;   event monitor showed PSVT,  PACs/ PVCs   PVC (premature ventricular contraction)    Sleep apnea, unspecified    study in epic 08-07-2018  borderline sleep apnea,  recommendation loss wt, sleep hygiene, mouth guard    Past Surgical History:  Procedure Laterality Date   COLONOSCOPY WITH ESOPHAGOGASTRODUODENOSCOPY (EGD)  11/15/2022   [name];   LEEP N/A 09/20/2021   Procedure: LOOP ELECTROSURGICAL EXCISION PROCEDURE (LEEP);  Surgeon: [name], MD;  Location: [facility] ORS;  Service: Gynecology;  Laterality: N/A;    Family History  Problem Relation Age of Onset   Emphysema Mother    Osteoarthritis Mother    Anxiety disorder Mother    Uterine cancer Mother 33   Other Father    Suicidality Father    Breast cancer Maternal Aunt    Breast cancer Maternal Aunt    Breast cancer Maternal Aunt    ALS Maternal Grandmother    Colon cancer Neg Hx    Colon  polyps Neg Hx    Esophageal cancer Neg Hx    Rectal cancer Neg Hx    Stomach cancer Neg Hx     Social:  reports that she quit smoking about 9 years ago. Her smoking use included cigarettes. She has a 5.00 pack-year smoking history. She has never used smokeless tobacco. She reports that she does not currently use alcohol. She reports that she does not use drugs.  Allergies:  Allergies  Allergen Reactions   Codeine Other (See Comments)    Hallucinations    Onion Swelling   Topamax [Topiramate]     Was talking out of her head, walked into walls    Medications: I have reviewed the  patient's current medications.  Results for orders placed or performed during the hospital encounter of 02/08/23 (from the past 48 hour(s))  Pregnancy, urine POC     Status: None   Collection Time: 02/08/23  9:31 AM  Result Value Ref Range   Preg Test, Ur NEGATIVE NEGATIVE    Comment:        THE SENSITIVITY OF THIS METHODOLOGY IS >24 mIU/mL     No results found.  ROS - all of the below systems have been reviewed with the patient and positives are indicated with bold text General: chills, fever or night sweats Eyes: blurry vision or double vision ENT: epistaxis or sore throat Allergy/Immunology: itchy/watery eyes or nasal congestion Hematologic/Lymphatic: bleeding problems, blood clots or swollen lymph nodes Endocrine: temperature intolerance or unexpected weight changes Breast: new or changing breast lumps or nipple discharge Resp: cough, shortness of breath, or wheezing CV: chest pain or dyspnea on exertion GI: as per HPI GU: dysuria, trouble voiding, or hematuria MSK: joint pain or joint stiffness Neuro: TIA or stroke symptoms Derm: pruritus and skin lesion changes Psych: anxiety and depression  PE Blood pressure (!) 127/91, pulse 69, temperature 98.2 F (36.8 C), temperature source Oral, resp. rate 16, height 5\' 4"  (1.626 m), weight 90.7 kg, last menstrual period 01/17/2023, SpO2 100 %. Constitutional: NAD; conversant Eyes: Moist conjunctiva Lungs: Normal respiratory effort CV: RRR Psychiatric: Appropriate affect  Results for orders placed or performed during the hospital encounter of 02/08/23 (from the past 48 hour(s))  Pregnancy, urine POC     Status: None   Collection Time: 02/08/23  9:31 AM  Result Value Ref Range   Preg Test, Ur NEGATIVE NEGATIVE    Comment:        THE SENSITIVITY OF THIS METHODOLOGY IS >24 mIU/mL     No results found.  I have personally reviewed the relevant  A/P: Virginia [name] is an 42 y.o. female with hx of MS on Kesimpta here for  surgery for symptomatic external hemorrhoids  -The anatomy and physiology of the anal canal was discussed with the patient with associated pictures. The pathophysiology of hemorrhoids and tags was discussed at length with associated pictures and illustrations. - -We have reviewed options going forward including further observation vs surgery - external hemorrhoidectomy x2, anorectal exam under anesthesia -The planned procedure, material risks (including, but not limited to, pain, bleeding, infection, scarring, need for blood transfusion, damage to anal sphincter, incontinence of gas and/or stool, need for additional procedures, anal stenosis, rare cases of pelvic sepsis which in severe cases may require things like a colostomy, recurrence, pneumonia, heart attack, stroke, death) benefits and alternatives to surgery were discussed at length. I noted a good probability that the procedure would help improve her symptoms. The patient's questions were answered to  her satisfaction, she voiced understanding and elected to proceed with surgery. Additionally, we discussed typical postoperative expectations and the recovery process.  Marin Olphristopher Virginia Gulden, MD [facility]

## 2023-02-08 NOTE — Transfer of Care (Signed)
Immediate Anesthesia Transfer of Care Note  Patient: Virginia [name]  Procedure(s) Performed: ANORECTAL EXAM UNDER ANESTHESIA (Rectum) EXTERNAL HEMORRHOIDECTOMY X2 (Rectum)  Patient Location: PACU  Anesthesia Type:General  Level of Consciousness: drowsy  Airway & Oxygen Therapy: Patient Spontanous Breathing and Patient connected to nasal cannula oxygen  Post-op Assessment: Report given to RN  Post vital signs: Reviewed and stable  Last Vitals:  Vitals Value Taken Time  BP 114/77   Temp    Pulse 79 02/08/23 1137  Resp 14 02/08/23 1137  SpO2 96 % 02/08/23 1137  Vitals shown include unvalidated device data.  Last Pain:  Vitals:   02/08/23 0954  TempSrc: Oral  PainSc: 0-No pain      Patients Stated Pain Goal: 3 (02/08/23 0954)  Complications: No notable events documented.

## 2023-02-08 NOTE — Anesthesia Postprocedure Evaluation (Signed)
Anesthesia Post Note  Patient: Virginia [name]  Procedure(s) Performed: ANORECTAL EXAM UNDER ANESTHESIA (Rectum) EXTERNAL HEMORRHOIDECTOMY X2 (Rectum)     Patient location during evaluation: PACU Anesthesia Type: General Level of consciousness: awake and alert Pain management: pain level controlled Vital Signs Assessment: post-procedure vital signs reviewed and stable Respiratory status: spontaneous breathing, nonlabored ventilation and respiratory function stable Cardiovascular status: blood pressure returned to baseline and stable Postop Assessment: no apparent nausea or vomiting Anesthetic complications: no   No notable events documented.  Last Vitals:  Vitals:   02/08/23 1215 02/08/23 1250  BP: 114/70 116/77  Pulse: 74 68  Resp: 11 16  Temp:  36.5 C  SpO2: 94% 99%    Last Pain:  Vitals:   02/08/23 1200  TempSrc:   PainSc: 7                  Lowella Curb

## 2023-02-08 NOTE — Op Note (Addendum)
02/08/2023  11:34 AM  PATIENT:  Virginia [name]  42 y.o. female  Patient Care Team: [name], MD as PCP - General (Family Medicine)  PRE-OPERATIVE DIAGNOSIS:  External hemorrhoids  POST-OPERATIVE DIAGNOSIS:  Same  PROCEDURE:   Posterior midline hemorrhoidectomy; Anterior midline hemorrhoidectomy Anorectal exam under anesthesia  SURGEON:  Surgeon(s): [name], MD  ASSISTANT: OR staff   ANESTHESIA:   local and general  SPECIMEN:   Posterior midline hemorrhoidal tissue Anterior midline hemorrhoidal tissue  DISPOSITION OF SPECIMEN:  PATHOLOGY  COUNTS:  Sponge, needle, and instrument counts were reported correct x2 at conclusion.  EBL: 2 mL  PLAN OF CARE: Discharge to home after PACU  PATIENT DISPOSITION:  PACU - hemodynamically stable.  OR FINDINGS: External hemorrhoidal tissue in both the anterior and posterior midlines.  2 column hemorrhoidectomy carried out uneventfully.  DESCRIPTION: The patient was identified in the preoperative holding area and taken to the OR where she was placed on the operating room table. SCDs were placed.  General anesthesia was induced without difficulty. The patient was then positioned in high lithotomy with Allen stirrups. Pressure points were then evaluated and padded.  She was then prepped and draped in usual sterile fashion.  A surgical timeout was performed indicating the correct patient, procedure, and positioning.  A perianal block was performed using a dilute mixture of 0.25% Marcaine with epinephrine and Exparel.   After ascertaining that an appropriate level of anesthesia had been achieved, a well lubricated digital rectal exam was performed. This demonstrated no palpable masses.  A Hill-Ferguson anoscope was into the anal canal and circumferential inspection demonstrated healthy appearing anoderm.    Externally, she does have 2 sizable external hemorrhoids with associated tag-like component which are nonthrombosed.   These are both the posterior and anterior midline's.  There is no other perianal abnormalities identified or ulceration of skin.  Hemorrhoids are soft.  Attention is directed at the posterior midline component first as this is the largest with most redundant skin.  With an anoscope in place, this is elevated with a DeBakey forcep.  This is incised sharply along the anoderm and a elliptical manner continued down to the base of the hemorrhoidal component.  The underlying sphincter muscle was dissected free and no muscle fibers were divided.  The posterior midline hemorrhoidal tissue was passed off as specimen.  Hemostasis was then achieved with electrocautery.  A few small hemorrhoidal varicosities within the hemorrhoidectomy better fulgurated.  The hemorrhoidal defect is then approximated using absorbable 3-0 Vicryl suture.  Attention is then directed at the anterior midline component.  There are 2 separate tags at both the 11 and 1:00 positions.  Rather than excising the whole swath of tissue, we approached the separately.  With an anoscope in place, the right side is addressed first.  This is elevated with a DeBakey forcep and incised sharply in an elliptical manner, dissecting free any underlying sphincter muscle no fibers were divided.  This was passed off as anterior midline hemorrhoidal tissue.  Hemostasis is achieved with electrocautery.  The elliptical defect is then approximated using absorbable 3-0 chromic suture.  Attention is then directed at the 11:00 component and excised in the exact same manner.  This was included as anterior midline hemorrhoidal tissue and specimen.  The defect was also closed with absorbable 3-0 chromic suture after hemostasis is achieved.  The anal canal was irrigated.  Hemostasis is verified.  Additional local anesthetic is infiltrated into the tissues around the surgical site.  All sponge, needle, and instrument counts are reported correct.  She is taken out of the  lithotomy position and a dressing consisting of 4 x 4's, ABD, and mesh underwear is placed.  She is awakened from anesthesia, extubated, and transported to the recovery room in satisfactory condition.  DISPOSITION: PACU in satisfactory condition.

## 2023-02-08 NOTE — Discharge Instructions (Addendum)
ANORECTAL SURGERY: POST OP INSTRUCTIONS  DIET: Follow a light bland diet the first 24 hours after arrival home, such as soup, liquids, crackers, etc.  Be sure to include lots of fluids daily.  Avoid fast food or heavy meals as your are more likely to get nauseated.  Eat a low fat diet the next few days after surgery.   Some bleeding with bowel movements is expected for the first couple of days but this should stop in between bowel movements. All suture material used in your surgery today is dissolvable and does not have to be removed. It is common to feel this material while wiping and will go away over the coming weeks  Take your usually prescribed home medications unless otherwise directed. No foreign bodies per rectum for the next 3 months (enemas, etc)  PAIN CONTROL: It is helpful to take an over-the-counter pain medication regularly for the first few days/weeks.  Choose from the following that works best for you: Ibuprofen (Advil, etc) Three 200mg  tabs every 6 hours as needed. Acetaminophen (Tylenol, etc) 500-650mg  every 6 hours as needed NOTE: You may take both of these medications together - most patients find it most helpful when alternating between the two (i.e. Ibuprofen at 6am, tylenol at 9am, ibuprofen at 12pm ..Marland Kitchen) A  prescription for pain medication may have been prescribed for you at discharge.  Take your pain medication as prescribed.  If you are having problems/concerns with the prescription medicine, please call us for further advice.  Avoid getting constipated.  Between the surgery and the pain medications, it is common to experience some constipation.  Increasing fluid intake (64oz of water per day) and taking a fiber supplement (such as Metamucil, Citrucel, FiberCon) 1-2 times a day regularly will usually help prevent this problem from occurring.  Take Miralax (over the counter) 1-2x/day while taking a narcotic pain medication. If no bowel movement after 48hours, you may  additionally take a laxative like a bottle of Milk of Magnesia which can be purchased over the counter. Avoid enemas.   Watch out for diarrhea.  If you have many loose bowel movements, simplify your diet to bland foods.  Stop any stool softeners and decrease your fiber supplement. If this worsens or does not improve, please call us.  Wash / shower every day.  If you were discharged with a dressing, you may remove this the day after your surgery. You may shower normally, getting soap/water on your wound, particularly after bowel movements.  Soaking in a warm bath filled a couple inches ("Sitz bath") is a great way to clean the area after a bowel movement and many patients find it is a way to soothe the area.  ACTIVITIES as tolerated:   You may resume regular (light) daily activities beginning the next day--such as daily self-care, walking, climbing stairs--gradually increasing activities as tolerated.  If you can walk 30 minutes without difficulty, it is safe to try more intense activity such as jogging, treadmill, bicycling, low-impact aerobics, etc. Refrain from any heavy lifting or straining for the first 2 weeks after your procedure, particularly if your surgery was for hemorrhoids. Avoid activities that make your pain worse You may drive when you are no longer taking prescription pain medication, you can comfortably wear a seatbelt, and you can safely maneuver your car and apply brakes.  FOLLOW UP in our office Please call [facility] at [phone] to set up an appointment to see your surgeon in the office for a follow-up appointment approximately  2 weeks after your surgery. Make sure that you call for this appointment the day you arrive home to insure a convenient appointment time.  9. If you have disability or family leave forms that need to be completed, you may have them completed by your primary care physician's office; for return to work instructions, please ask our office staff and they will  be happy to assist you in obtaining this documentation   When to call us [phone]: Poor pain control Reactions / problems with new medications (rash/itching, etc)  Fever over 101.5 F (38.5 C) Inability to urinate Nausea/vomiting Worsening swelling or bruising Continued bleeding from incision. Increased pain, redness, or drainage from the incision  The clinic staff is available to answer your questions during regular business hours (8:30am-5pm).  Please don't hesitate to call and ask to speak to one of our nurses for clinical concerns.   A surgeon from [facility] is always on call at the hospitals   If you have a medical emergency, go to the nearest emergency room or call 911.   [facility] [address], [address], [place], [place]  [address] MAIN: [phone] FAX: [phone] [email or web address]     No acetaminophen/Tylenol until after 3:45pm today if needed for pain.   No ibuprofen, Advil, Aleve, Motrin, ketorolac, meloxicam, naproxen, or other NSAIDS until after 5:00pm today if needed for pain.    Information for Discharge Teaching: EXPAREL (bupivacaine liposome injectable suspension)   Your surgeon or anesthesiologist gave you EXPAREL(bupivacaine) to help control your pain after surgery.  EXPAREL is a local anesthetic that provides pain relief by numbing the tissue around the surgical site. EXPAREL is designed to release pain medication over time and can control pain for up to 72 hours. Depending on how you respond to EXPAREL, you may require less pain medication during your recovery.  Possible side effects: Temporary loss of sensation or ability to move in the area where bupivacaine was injected. Nausea, vomiting, constipation Rarely, numbness and tingling in your mouth or lips, lightheadedness, or anxiety may occur. Call your doctor right away if you think you may be experiencing any of  these sensations, or if you have other questions regarding possible side effects.  Follow all other discharge instructions given to you by your surgeon or nurse. Eat a healthy diet and drink plenty of water or other fluids.  If you return to the hospital for any reason within 96 hours following the administration of EXPAREL, it is important for health care providers to know that you have received this anesthetic. A teal colored band has been placed on your arm with the date, time and amount of EXPAREL you have received in order to alert and inform your health care providers. Please leave this armband in place for the full 96 hours following administration, and then you may remove the band.  Band to be removed Monday February 12, 2023.    Post Anesthesia Home Care Instructions  Activity: Get plenty of rest for the remainder of the day. A responsible individual must stay with you for 24 hours following the procedure.  For the next 24 hours, DO NOT: -Drive a car -Advertising copywriter -Drink alcoholic beverages -Take any medication unless instructed by your physician -Make any legal decisions or sign important papers.  Meals: Start with liquid foods such as gelatin or soup. Progress to regular foods as tolerated. Avoid greasy, spicy, heavy foods. If nausea and/or vomiting occur, drink only  clear liquids until the nausea and/or vomiting subsides. Call your physician if vomiting continues.  Special Instructions/Symptoms: Your throat may feel dry or sore from the anesthesia or the breathing tube placed in your throat during surgery. If this causes discomfort, gargle with warm salt water. The discomfort should disappear within 24 hours.

## 2023-02-08 NOTE — Anesthesia Preprocedure Evaluation (Signed)
Anesthesia Evaluation  Patient identified by MRN, date of birth, ID band Patient awake    Reviewed: Allergy & Precautions, NPO status , Patient's Chart, lab work & pertinent test results  History of Anesthesia Complications Negative for: history of anesthetic complications  Airway Mallampati: IV   Neck ROM: Full    Dental no notable dental hx.    Pulmonary sleep apnea , Patient abstained from smoking., former smoker   Pulmonary exam normal breath sounds clear to auscultation       Cardiovascular Exercise Tolerance: Good Normal cardiovascular exam Rhythm:Regular Rate:Normal  Hx tachycardia, PVCs, PACs  ECG 09/19/21: normal   Neuro/Psych  Headaches PSYCHIATRIC DISORDERS Anxiety Depression     Neuromuscular disease (multiple sclerosis)    GI/Hepatic ,GERD  ,,  Endo/Other  Class 3 obesity  Renal/GU negative Renal ROS     Musculoskeletal  (+) Arthritis , Osteoarthritis,    Abdominal  (+) + obese  Peds  Hematology negative hematology ROS (+)   Anesthesia Other Findings Multiple Sclerosis  Reproductive/Obstetrics CIN II                             Anesthesia Physical Anesthesia Plan  ASA: 3  Anesthesia Plan: General   Post-op Pain Management: Tylenol PO (pre-op)*   Induction: Intravenous  PONV Risk Score and Plan: 3 and Ondansetron, Dexamethasone, Treatment may vary due to age or medical condition and Midazolam  Airway Management Planned: LMA  Additional Equipment:   Intra-op Plan:   Post-operative Plan: Extubation in OR  Informed Consent: I have reviewed the patients History and Physical, chart, labs and discussed the procedure including the risks, benefits and alternatives for the proposed anesthesia with the patient or authorized representative who has indicated his/her understanding and acceptance.     Dental advisory given  Plan Discussed with: CRNA  Anesthesia Plan  Comments: (Anesthetic considerations for multiple sclerosis: closely monitor body temperature to avoid increase above baseline, avoid succinylcholine, pt may have altered sensitivity to NDMRs.  Patient consented for risks of anesthesia including but not limited to:  - adverse reactions to medications - damage to eyes, teeth, lips or other oral mucosa - nerve damage due to positioning  - sore throat or hoarseness - damage to heart, brain, nerves, lungs, other parts of body or loss of life  Informed patient about role of CRNA in peri- and intra-operative care.  Patient voiced understanding.)        Anesthesia Quick Evaluation

## 2023-02-09 ENCOUNTER — Encounter (HOSPITAL_BASED_OUTPATIENT_CLINIC_OR_DEPARTMENT_OTHER): Payer: Self-pay | Admitting: Surgery

## 2023-02-09 LAB — SURGICAL PATHOLOGY

## 2023-02-23 ENCOUNTER — Ambulatory Visit (INDEPENDENT_AMBULATORY_CARE_PROVIDER_SITE_OTHER): Payer: BC Managed Care – PPO | Admitting: Family Medicine

## 2023-02-23 ENCOUNTER — Encounter: Payer: Self-pay | Admitting: Family Medicine

## 2023-02-23 VITALS — BP 126/74 | HR 83 | Temp 98.4°F | Ht 64.0 in | Wt 196.8 lb

## 2023-02-23 DIAGNOSIS — G4700 Insomnia, unspecified: Secondary | ICD-10-CM

## 2023-02-23 DIAGNOSIS — S76012A Strain of muscle, fascia and tendon of left hip, initial encounter: Secondary | ICD-10-CM

## 2023-02-23 MED ORDER — TRAZODONE HCL 100 MG PO TABS: 100.0000 mg | ORAL_TABLET | Freq: Every evening | ORAL | 1 refills | Status: DC | PRN

## 2023-02-23 MED ORDER — PREDNISONE 20 MG PO TABS: ORAL_TABLET | ORAL | 0 refills | Status: DC

## 2023-02-23 NOTE — Progress Notes (Signed)
Subjective:    Patient ID: Virginia [name], female    DOB: [birth year], 42 y.o.   MRN: [id number] Patient presents complaining of pain in her left flank.  The pain is located in the mid axillary line and posterior to the mid axillary line just above the iliac crest.  She is extremely tender to palpation in that area.  There is no visible rash.  There is no erythema.  There is no bruising.  She has full flexion and extension and internal and external rotation of the left hip.  There is no tenderness to palpation of the greater trochanteric bursa.  She denies any radicular pain radiating from the sciatic notch down her left leg.  She denies any dysuria or fever or urgency.  She denies any hematochezia or melena or diarrhea.  She denies any fevers or chills or abdominal pain nausea or vomiting.  She also request that I increase her temazepam.  She reports trouble sleeping.  Amitriptyline is not helping Past Medical History:  Diagnosis Date   Arthritis    Chronic migraine w/o aura w/o status migrainosus, not intractable    followed by [name]   GAD (generalized anxiety disorder)    GERD (gastroesophageal reflux disease)    Hemorrhoids    History of cervical dysplasia    followed by GYN--- [name]. harris;    hx CIN 2  s/p  colposcopy in office 05/ 2022 and CIN 3  s/p LEEP 11/ 2022   History of syncope 02/2016   admission in epic;  in setting of vertigo and dx w/ MS   Insomnia    Multiple sclerosis, relapsing-remitting (HCC) 02/2016   neurologist--- [name];  dx 05/ 2017 lumbar puncture,  dyesthesia, fatigue, vertigo   PAC (premature atrial contraction)    PSVT (paroxysmal supraventricular tachycardia) 2019   (02-01-2023 pt stated followed by pcp) // cardiologist--- dr g. taylor;  (lov in epic 06-21-2018)  evaluated 2019 after dx MS in 2017 with syncope/ palpitations;   event monitor showed PSVT,  PACs/ PVCs   PVC (premature ventricular contraction)    Sleep apnea, unspecified    study in epic  08-07-2018  borderline sleep apnea,  recommendation loss wt, sleep hygiene, mouth guard   Past Surgical History:  Procedure Laterality Date   COLONOSCOPY WITH ESOPHAGOGASTRODUODENOSCOPY (EGD)  11/15/2022   [name];   EVALUATION UNDER ANESTHESIA WITH HEMORRHOIDECTOMY N/A 02/08/2023   Procedure: ANORECTAL EXAM UNDER ANESTHESIA;  Surgeon: [name], MD;  Location: [facility];  Service: General;  Laterality: N/A;   HEMORRHOID SURGERY  02/08/2023   Procedure: EXTERNAL HEMORRHOIDECTOMY X2;  Surgeon: [name], MD;  Location: [facility];  Service: General;;   LEEP N/A 09/20/2021   Procedure: LOOP ELECTROSURGICAL EXCISION PROCEDURE (LEEP);  Surgeon: [name], MD;  Location: [facility];  Service: Gynecology;  Laterality: N/A;   Current Outpatient Medications on File Prior to Visit  Medication Sig Dispense Refill   atenolol (TENORMIN) 50 MG tablet TAKE 1 TABLET BY MOUTH EVERY DAY (Patient taking differently: Take 50 mg by mouth at bedtime.) 90 tablet 1   baclofen (LIORESAL) 10 MG tablet TAKE 2TABLETS BY MOUTH 3 TIMES A DAY FOR SPASTICITY (Patient taking differently: Take 20 mg by mouth 3 (three) times daily. TAKE 2TABLETS BY MOUTH 3 TIMES A DAY FOR SPASTICITY) 540 tablet 3   buPROPion (WELLBUTRIN XL) 150 MG 24 hr tablet Take 1 tablet (150 mg total) by mouth daily. (Patient taking  differently: Take 150 mg by mouth daily.) 30 tablet 5   clonazePAM (KLONOPIN) 1 MG tablet TAKE 1 TABLET BY MOUTH TWICE A DAY AS NEEDED FOR ANXIETY (Patient taking differently: Take 1 mg by mouth 2 (two) times daily as needed for anxiety.) 20 tablet 1   DULoxetine (CYMBALTA) 60 MG capsule Take 1 capsule (60 mg total) by mouth daily. 90 capsule 0   ergocalciferol (VITAMIN D2) 1.25 MG (50000 UT) capsule Take 50,000 Units by mouth every 30 (thirty) days.     etodolac (LODINE) 400 MG tablet Take 1 tablet (400 mg total) by mouth 2 (two) times daily. (Patient taking differently:  Take 400 mg by mouth 2 (two) times daily as needed for mild pain or moderate pain.) 60 tablet 5   gabapentin (NEURONTIN) 600 MG tablet Take 1 tablet (600 mg total) by mouth 4 (four) times daily. 120 tablet 11   HAILEY 24 FE 1-20 MG-MCG(24) tablet TAKE 1 TABLET BY MOUTH EVERY DAY (Patient taking differently: Take 1 tablet by mouth at bedtime.) 84 tablet 3   hyoscyamine (LEVSIN SL) 0.125 MG SL tablet Place 1 tablet (0.125 mg total) under the tongue every 6 (six) hours as needed. (Patient taking differently: Place 0.125 mg under the tongue every 6 (six) hours as needed for cramping.) 30 tablet 2   meclizine (ANTIVERT) 25 MG tablet Take 1 tablet (25 mg total) by mouth 3 (three) times daily as needed for dizziness. 30 tablet 11   Ofatumumab (KESIMPTA) 20 MG/0.4ML SOAJ Inject 20 mg into the skin every 30 (thirty) days. 20mg  at week 0, 1, 2. Then 20mg  once monthly starting at week 4. (Patient taking differently: Inject 20 mg into the skin every 30 (thirty) days. 20mg  at week 0, 1, 2. Then 20mg  once monthly starting at week 4.) 1.68 mL 4   pantoprazole (PROTONIX) 40 MG tablet TAKE 1 TABLET BY MOUTH TWICE A DAY (Patient taking differently: Take 40 mg by mouth 2 (two) times daily as needed.) 180 tablet 0   sucralfate (CARAFATE) 1 g tablet TAKE 1 TABLET BY MOUTH 4 TIMES DAILY - WITH MEALS AND AT BEDTIME. (Patient taking differently: Take 1 g by mouth as needed.) 120 tablet 1   SUMAtriptan (IMITREX) 50 MG tablet TAKE 1 TABLET EVERY 2 HRS AS NEEDED FOR MIGRAINE. MAY REPEAT IN 2 HRS IF HEADACHE PERSISTS OR RECURS (Patient taking differently: Take 50 mg by mouth every 2 (two) hours as needed for migraine.) 10 tablet 5   temazepam (RESTORIL) 30 MG capsule TAKE 1 CAPSULE AT BEDTIME AS NEEDED FOR SLEEP (DO NOT TAKE WITH KLONOPIN). (Patient taking differently: Take 30 mg by mouth at bedtime as needed for sleep.) 15 capsule 3   No current facility-administered medications on file prior to visit.   Allergies  Allergen  Reactions   Codeine Other (See Comments)    Hallucinations    Onion Swelling   Topamax [Topiramate]     Was talking out of her head, walked into walls   Social History   Socioeconomic History   Marital status: Married    Spouse name: Not on file   Number of children: Not on file   Years of education: Not on file   Highest education level: 12th grade  Occupational History   Not on file  Tobacco Use   Smoking status: Former    Packs/day: 0.50    Years: 10.00    Additional pack years: 0.00    Total pack years: 5.00    Types:  Cigarettes    Quit date: 2015    Years since quitting: 9.3   Smokeless tobacco: Never  Vaping Use   Vaping Use: Every day   Substances: Nicotine, Flavoring, Nicotine-salt   Devices: unsure name  Substance and Sexual Activity   Alcohol use: Not Currently    Comment: rarely   Drug use: Never   Sexual activity: Not Currently    Birth control/protection: Pill  Other Topics Concern   Not on file  Social History Narrative   Not on file   Social Determinants of Health   Financial Resource Strain: High Risk (02/19/2023)   Overall Financial Resource Strain (CARDIA)    Difficulty of Paying Living Expenses: Hard  Food Insecurity: Food Insecurity Present (02/19/2023)   Hunger Vital Sign    Worried About Running Out of Food in the Last Year: Sometimes true    Ran Out of Food in the Last Year: Sometimes true  Transportation Needs: No Transportation Needs (02/19/2023)   PRAPARE - Administrator, Civil Service (Medical): No    Lack of Transportation (Non-Medical): No  Physical Activity: Unknown (02/19/2023)   Exercise Vital Sign    Days of Exercise per Week: Patient declined    Minutes of Exercise per Session: Not on file  Stress: Stress Concern Present (02/19/2023)   [employer] of Occupational Health - Occupational Stress Questionnaire    Feeling of Stress : Rather much  Social Connections: Unknown (02/19/2023)   Social Connection and  Isolation Panel [NHANES]    Frequency of Communication with Friends and Family: More than three times a week    Frequency of Social Gatherings with Friends and Family: Once a week    Attends Religious Services: Patient declined    Database administrator or Organizations: Patient declined    Attends Banker Meetings: Not on file    Marital Status: Separated  Intimate Partner Violence: Not on file      Review of Systems  All other systems reviewed and are negative.      Objective:   Physical Exam Exam conducted with a chaperone present.  Constitutional:      Appearance: Normal appearance. She is obese.  Cardiovascular:     Rate and Rhythm: Normal rate and regular rhythm.     Heart sounds: Normal heart sounds. No murmur heard. Pulmonary:     Effort: Pulmonary effort is normal. No respiratory distress.     Breath sounds: Normal breath sounds. No stridor. No wheezing, rhonchi or rales.  Genitourinary:    Rectum: Tenderness, anal fissure and external hemorrhoid present.  Musculoskeletal:     Right hip: Normal.     Left hip: Normal. No deformity, lacerations, tenderness, bony tenderness or crepitus. Normal range of motion. Normal strength.       Legs:  Neurological:     General: No focal deficit present.     Mental Status: She is alert and oriented to person, place, and time. Mental status is at baseline.     Cranial Nerves: No cranial nerve deficit.         Assessment & Plan:  Strain of left hip, initial encounter  Insomnia, unspecified type I believe her pain is muscular and may represent a strain of one of the hip abductors or possibly one of the oblique muscles.  She is already on baclofen 20 mg 3 times a day along with low to twice daily for her pain related to MS.  I have asked the patient  to hold her NSAID and we will try a prednisone taper pack as an anti-inflammatory for the muscle strain.  I declined to increase her temazepam.  I recommended  discontinuation of amitriptyline since this is not helping and trying trazodone 50 to 100 mg p.o. nightly for insomnia

## 2023-03-02 ENCOUNTER — Other Ambulatory Visit: Payer: Self-pay | Admitting: Family Medicine

## 2023-03-02 ENCOUNTER — Other Ambulatory Visit: Payer: Self-pay | Admitting: Neurology

## 2023-03-02 ENCOUNTER — Other Ambulatory Visit: Payer: Self-pay | Admitting: Gastroenterology

## 2023-03-02 DIAGNOSIS — F411 Generalized anxiety disorder: Secondary | ICD-10-CM

## 2023-03-05 NOTE — Telephone Encounter (Signed)
Last seen on 01/04/23 per note "For headaches, continue amitriptyline 50 mg nightly. Sumatriptan for breakthrough. "  Follow up scheduled on 07/18/23 Last filled on 02/06/23 # 10 tablets (30 day supply)

## 2023-03-17 ENCOUNTER — Other Ambulatory Visit: Payer: Self-pay | Admitting: Family Medicine

## 2023-03-19 NOTE — Telephone Encounter (Signed)
Requested medication (s) are due for refill today - no  Requested medication (s) are on the active medication list -yes  Future visit scheduled -no  Last refill: 02/23/23 #90 1RF  Notes to clinic: New start Rx- request for 90 day supply- requires change to original Rx- does not cover this amount.  Requested Prescriptions  Pending Prescriptions Disp Refills   traZODone (DESYREL) 100 MG tablet [Pharmacy Med Name: TRAZODONE 100 MG TABLET] 90 tablet 1    Sig: TAKE 1 TABLET BY MOUTH AT BEDTIME AS NEEDED FOR SLEEP.     Psychiatry: Antidepressants - Serotonin Modulator Failed - 03/17/2023  1:32 PM      Failed - Valid encounter within last 6 months    Recent Outpatient Visits           1 year ago Pelvic pain   [facility] Pickard, Priscille Heidelberg, MD   1 year ago Viral upper respiratory tract infection   [facility] Pickard, Priscille Heidelberg, MD   1 year ago Elevated serum creatinine   [facility] Pickard, Priscille Heidelberg, MD   1 year ago Cervical radiculopathy   [facility] [name], MD   2 years ago Diarrhea, unspecified type   [facility] Valentino Nose, NP              Passed - Completed PHQ-2 or PHQ-9 in the last 360 days         Requested Prescriptions  Pending Prescriptions Disp Refills   traZODone (DESYREL) 100 MG tablet [Pharmacy Med Name: TRAZODONE 100 MG TABLET] 90 tablet 1    Sig: TAKE 1 TABLET BY MOUTH AT BEDTIME AS NEEDED FOR SLEEP.     Psychiatry: Antidepressants - Serotonin Modulator Failed - 03/17/2023  1:32 PM      Failed - Valid encounter within last 6 months    Recent Outpatient Visits           1 year ago Pelvic pain   [facility] Tanya Nones, Priscille Heidelberg, MD   1 year ago Viral upper respiratory tract infection   [facility] Tanya Nones, Priscille Heidelberg, MD   1 year ago Elevated serum creatinine   [facility] Tanya Nones, Priscille Heidelberg, MD   1 year  ago Cervical radiculopathy   [facility] [name], MD   2 years ago Diarrhea, unspecified type   [facility] Medicine Valentino Nose, NP              Passed - Completed PHQ-2 or PHQ-9 in the last 360 days

## 2023-03-20 ENCOUNTER — Encounter: Payer: Self-pay | Admitting: Family Medicine

## 2023-03-23 ENCOUNTER — Other Ambulatory Visit: Payer: Self-pay | Admitting: Family Medicine

## 2023-03-23 MED ORDER — TEMAZEPAM 30 MG PO CAPS: 30.0000 mg | ORAL_CAPSULE | Freq: Every evening | ORAL | 0 refills | Status: DC | PRN

## 2023-03-31 ENCOUNTER — Other Ambulatory Visit: Payer: Self-pay | Admitting: Neurology

## 2023-04-03 ENCOUNTER — Other Ambulatory Visit: Payer: Self-pay

## 2023-04-24 ENCOUNTER — Other Ambulatory Visit: Payer: Self-pay | Admitting: Family Medicine

## 2023-04-24 DIAGNOSIS — F411 Generalized anxiety disorder: Secondary | ICD-10-CM

## 2023-04-25 NOTE — Telephone Encounter (Signed)
Requested medication (s) are due for refill today:yes  Requested medication (s) are on the active medication list: yes  Last refill:  03/02/23 #20 1 RF  Future visit scheduled: no  Notes to clinic:  med not delegated to NT to RF    Requested Prescriptions  Pending Prescriptions Disp Refills   clonazePAM (KLONOPIN) 1 MG tablet [Pharmacy Med Name: CLONAZEPAM 1 MG TABLET] 20 tablet 1    Sig: TAKE 1 TABLET BY MOUTH TWICE A DAY AS NEEDED FOR ANXIETY     Not Delegated - Psychiatry: Anxiolytics/Hypnotics 2 Failed - 04/24/2023  2:22 PM      Failed - This refill cannot be delegated      Failed - Urine Drug Screen completed in last 360 days      Failed - Valid encounter within last 6 months    Recent Outpatient Visits           1 year ago Pelvic pain   [facility] [name], MD   1 year ago Viral upper respiratory tract infection   [facility] Pickard, Priscille Heidelberg, MD   1 year ago Elevated serum creatinine   [facility] Tanya Nones, Priscille Heidelberg, MD   2 years ago Cervical radiculopathy   [facility] [name], MD   2 years ago Diarrhea, unspecified type   [facility] Medicine Valentino Nose, NP              Passed - Patient is not pregnant

## 2023-04-26 ENCOUNTER — Telehealth: Payer: Self-pay | Admitting: Neurology

## 2023-04-26 DIAGNOSIS — G35 Multiple sclerosis: Secondary | ICD-10-CM

## 2023-04-26 MED ORDER — KESIMPTA 20 MG/0.4ML ~~LOC~~ SOAJ
0.4000 mL | SUBCUTANEOUS | 2 refills | Status: DC
Start: 2023-04-26 — End: 2024-01-14

## 2023-04-26 NOTE — Telephone Encounter (Signed)
Pt is requesting a refill for Ofatumumab (KESIMPTA) 20 MG/0.4ML SOAJ  90 day  .  [facility] [phone] fax:[phone]

## 2023-04-26 NOTE — Telephone Encounter (Signed)
E-scribed rx to pharmacy.  

## 2023-04-27 ENCOUNTER — Other Ambulatory Visit: Payer: Self-pay | Admitting: Family Medicine

## 2023-04-27 DIAGNOSIS — F410 Panic disorder [episodic paroxysmal anxiety] without agoraphobia: Secondary | ICD-10-CM

## 2023-04-27 DIAGNOSIS — F320 Major depressive disorder, single episode, mild: Secondary | ICD-10-CM

## 2023-04-30 NOTE — Telephone Encounter (Signed)
Requested medication (s) are due for refill today: yes Requested medication (s) are on the active medication list: yes  Last refill:  02/01/23  Future visit scheduled: no  Notes to clinic:  please review - pt has been seen but not for this specific issue   Requested Prescriptions  Pending Prescriptions Disp Refills   DULoxetine (CYMBALTA) 60 MG capsule [Pharmacy Med Name: DULOXETINE HCL DR 60 MG CAP] 90 capsule 0    Sig: TAKE 1 CAPSULE BY MOUTH EVERY DAY     Psychiatry: Antidepressants - SNRI - duloxetine Failed - 04/27/2023  9:17 AM      Failed - Valid encounter within last 6 months    Recent Outpatient Visits           1 year ago Pelvic pain   [facility] [name], MD   1 year ago Viral upper respiratory tract infection   [facility] Medicine Pickard, Priscille Heidelberg, MD   1 year ago Elevated serum creatinine   [facility] Tanya Nones, Priscille Heidelberg, MD   2 years ago Cervical radiculopathy   [facility] [name], MD   2 years ago Diarrhea, unspecified type   [facility] Medicine Valentino Nose, NP              Passed - Cr in normal range and within 360 days    Creat  Date Value Ref Range Status  10/03/2021 0.91 0.50 - 0.99 mg/dL Final   Creatinine, Ser  Date Value Ref Range Status  07/25/2022 0.89 0.57 - 1.00 mg/dL Final   Creatinine, Urine  Date Value Ref Range Status  07/26/2021 43 20 - 275 mg/dL Final         Passed - eGFR is 30 or above and within 360 days    GFR, Est African American  Date Value Ref Range Status  08/24/2020 93 > OR = 60 mL/min/1.72m2 Final   GFR, Est Non African American  Date Value Ref Range Status  08/24/2020 80 > OR = 60 mL/min/1.74m2 Final   GFR, Estimated  Date Value Ref Range Status  09/27/2021 >60 >60 mL/min Final    Comment:    (NOTE) Calculated using the CKD-EPI Creatinine Equation (2021)    eGFR  Date Value Ref Range Status  07/25/2022 83 >59  mL/min/1.73 Final         Passed - Completed PHQ-2 or PHQ-9 in the last 360 days      Passed - Last BP in normal range    BP Readings from Last 1 Encounters:  02/23/23 126/74

## 2023-05-19 ENCOUNTER — Other Ambulatory Visit: Payer: Self-pay | Admitting: Family Medicine

## 2023-05-19 DIAGNOSIS — F411 Generalized anxiety disorder: Secondary | ICD-10-CM

## 2023-05-21 NOTE — Telephone Encounter (Signed)
Prescription Request  05/21/2023  LOV: 02/23/2023  What is the name of the medication or equipment? temazepam (RESTORIL) 30 MG capsule [[id number]]  Have you contacted your pharmacy to request a refill? Yes   Which pharmacy would you like this sent to?  [id number] [place], [place] - [address] AT [facility] [address] [place] [address] Phone: [phone] Fax: [phone]    Patient notified that their request is being sent to the clinical staff for review and that they should receive a response within 2 business days.   Please advise at Mobile [phone] (mobile)

## 2023-05-21 NOTE — Telephone Encounter (Signed)
Requested medication (s) are due for refill today - provider review   Requested medication (s) are on the active medication list -yes  Future visit scheduled -no  Last refill: 04/26/23 #20 1RF  Notes to clinic: non delegated Rx  Requested Prescriptions  Pending Prescriptions Disp Refills   clonazePAM (KLONOPIN) 1 MG tablet [Pharmacy Med Name: CLONAZEPAM 1 MG TABLET] 20 tablet 1    Sig: TAKE 1 TABLET BY MOUTH TWICE A DAY AS NEEDED FOR ANXIETY     Not Delegated - Psychiatry: Anxiolytics/Hypnotics 2 Failed - 05/21/2023  9:41 AM      Failed - This refill cannot be delegated      Failed - Urine Drug Screen completed in last 360 days      Failed - Valid encounter within last 6 months    Recent Outpatient Visits           1 year ago Pelvic pain   [facility] [name], MD   1 year ago Viral upper respiratory tract infection   [facility] Pickard, Priscille Heidelberg, MD   1 year ago Elevated serum creatinine   [facility] Tanya Nones, Priscille Heidelberg, MD   2 years ago Cervical radiculopathy   [facility] Tanya Nones, Priscille Heidelberg, MD   2 years ago Diarrhea, unspecified type   [facility] Medicine Valentino Nose, NP              Passed - Patient is not pregnant         Requested Prescriptions  Pending Prescriptions Disp Refills   clonazePAM (KLONOPIN) 1 MG tablet [Pharmacy Med Name: CLONAZEPAM 1 MG TABLET] 20 tablet 1    Sig: TAKE 1 TABLET BY MOUTH TWICE A DAY AS NEEDED FOR ANXIETY     Not Delegated - Psychiatry: Anxiolytics/Hypnotics 2 Failed - 05/21/2023  9:41 AM      Failed - This refill cannot be delegated      Failed - Urine Drug Screen completed in last 360 days      Failed - Valid encounter within last 6 months    Recent Outpatient Visits           1 year ago Pelvic pain   [facility] [name], MD   1 year ago Viral upper respiratory tract infection   [facility] Pickard, Priscille Heidelberg, MD   1 year ago Elevated serum creatinine   [facility] Tanya Nones, Priscille Heidelberg, MD   2 years ago Cervical radiculopathy   [facility] [name], MD   2 years ago Diarrhea, unspecified type   [facility] Medicine Valentino Nose, NP              Passed - Patient is not pregnant

## 2023-05-23 ENCOUNTER — Encounter: Payer: Self-pay | Admitting: Family Medicine

## 2023-05-23 ENCOUNTER — Other Ambulatory Visit: Payer: Self-pay | Admitting: Family Medicine

## 2023-05-24 MED ORDER — TEMAZEPAM 30 MG PO CAPS: 30.0000 mg | ORAL_CAPSULE | Freq: Every evening | ORAL | 0 refills | Status: DC | PRN

## 2023-05-30 ENCOUNTER — Telehealth: Payer: Self-pay | Admitting: Family Medicine

## 2023-05-30 NOTE — Telephone Encounter (Signed)
LVM and sent mychart msg informing pt of need to reschedule 07/18/23 appt - NP out

## 2023-06-06 ENCOUNTER — Other Ambulatory Visit: Payer: Self-pay | Admitting: Neurology

## 2023-06-06 NOTE — Telephone Encounter (Signed)
Last seen on 01/04/23 Follow up scheduled on 07/26/23

## 2023-06-16 ENCOUNTER — Other Ambulatory Visit: Payer: Self-pay | Admitting: Family Medicine

## 2023-06-16 DIAGNOSIS — F411 Generalized anxiety disorder: Secondary | ICD-10-CM

## 2023-06-18 ENCOUNTER — Other Ambulatory Visit: Payer: Self-pay | Admitting: Family Medicine

## 2023-06-18 DIAGNOSIS — I10 Essential (primary) hypertension: Secondary | ICD-10-CM

## 2023-06-18 NOTE — Telephone Encounter (Signed)
Prescription Request  06/18/2023  LOV: 02/23/2023  What is the name of the medication or equipment?   atenolol (TENORMIN) 50 MG tablet  **90 day script requested**  Have you contacted your pharmacy to request a refill? Yes   Which pharmacy would you like this sent to?  [id number] [place], [place] - [address] AT [facility] [address] [place]  [address] Phone: [phone] Fax: [phone]    Patient notified that their request is being sent to the clinical staff for review and that they should receive a response within 2 business days.   Please advise pharmacist.

## 2023-06-19 MED ORDER — ATENOLOL 50 MG PO TABS
50.0000 mg | ORAL_TABLET | Freq: Every day | ORAL | 0 refills | Status: DC
Start: 2023-06-19 — End: 2023-10-03

## 2023-06-19 NOTE — Telephone Encounter (Signed)
Requested Prescriptions  Pending Prescriptions Disp Refills   atenolol (TENORMIN) 50 MG tablet 90 tablet 1    Sig: Take 1 tablet (50 mg total) by mouth daily.     Cardiovascular: Beta Blockers 2 Failed - 06/18/2023 11:29 AM      Failed - Valid encounter within last 6 months    Recent Outpatient Visits           1 year ago Pelvic pain   [facility] Tanya Nones, Priscille Heidelberg, MD   1 year ago Viral upper respiratory tract infection   [facility] Pickard, Priscille Heidelberg, MD   1 year ago Elevated serum creatinine   [facility] Tanya Nones, Priscille Heidelberg, MD   2 years ago Cervical radiculopathy   [facility] Tanya Nones, Priscille Heidelberg, MD   2 years ago Diarrhea, unspecified type   [facility] Medicine Valentino Nose, NP              Passed - Cr in normal range and within 360 days    Creat  Date Value Ref Range Status  10/03/2021 0.91 0.50 - 0.99 mg/dL Final   Creatinine, Ser  Date Value Ref Range Status  07/25/2022 0.89 0.57 - 1.00 mg/dL Final   Creatinine, Urine  Date Value Ref Range Status  07/26/2021 43 20 - 275 mg/dL Final         Passed - Last BP in normal range    BP Readings from Last 1 Encounters:  02/23/23 126/74         Passed - Last Heart Rate in normal range    Pulse Readings from Last 1 Encounters:  02/23/23 83

## 2023-07-01 ENCOUNTER — Other Ambulatory Visit: Payer: Self-pay | Admitting: Family Medicine

## 2023-07-01 ENCOUNTER — Other Ambulatory Visit: Payer: Self-pay | Admitting: Neurology

## 2023-07-03 NOTE — Telephone Encounter (Signed)
 Last seen on 01/04/23 Follow up scheduled on 07/26/23

## 2023-07-04 NOTE — Telephone Encounter (Signed)
Requested medication (s) are due for refill today: Yes  Requested medication (s) are on the active medication list: Yes  Last refill:  05/24/23 #30, 0RF  Future visit scheduled: No  Notes to clinic:  Unable to refill per protocol, cannot delegate.      Requested Prescriptions  Pending Prescriptions Disp Refills   temazepam (RESTORIL) 30 MG capsule [Pharmacy Med Name: TEMAZEPAM 30 MG CAPSULE] 15 capsule 1    Sig: TAKE 1 CAPSULE (30 MG TOTAL) BY MOUTH AT BEDTIME AS NEEDED FOR SLEEP. (DO NOT TAKE WITH KLONOPIN).     Not Delegated - Psychiatry: Anxiolytics/Hypnotics 2 Failed - 07/01/2023 10:24 PM      Failed - This refill cannot be delegated      Failed - Urine Drug Screen completed in last 360 days      Failed - Valid encounter within last 6 months    Recent Outpatient Visits           1 year ago Pelvic pain   [facility] Tanya Nones, Priscille Heidelberg, MD   1 year ago Viral upper respiratory tract infection   [facility] Medicine Tanya Nones Priscille Heidelberg, MD   1 year ago Elevated serum creatinine   [facility] Tanya Nones, Priscille Heidelberg, MD   2 years ago Cervical radiculopathy   [facility] Family Medicine [name], MD   2 years ago Diarrhea, unspecified type   [facility] Medicine Valentino Nose, NP              Passed - Patient is not pregnant

## 2023-07-05 ENCOUNTER — Encounter: Payer: Self-pay | Admitting: Family Medicine

## 2023-07-05 ENCOUNTER — Other Ambulatory Visit: Payer: Self-pay | Admitting: Family Medicine

## 2023-07-05 MED ORDER — TEMAZEPAM 30 MG PO CAPS: 30.0000 mg | ORAL_CAPSULE | Freq: Every evening | ORAL | 0 refills | Status: DC | PRN

## 2023-07-07 ENCOUNTER — Other Ambulatory Visit: Payer: Self-pay | Admitting: Family Medicine

## 2023-07-09 NOTE — Telephone Encounter (Signed)
Requested Prescriptions  Pending Prescriptions Disp Refills   amitriptyline (ELAVIL) 50 MG tablet [Pharmacy Med Name: AMITRIPTYLINE HCL 50 MG TAB] 90 tablet 3    Sig: TAKE 1 TABLET BY MOUTH AT BEDTIME AS NEEDED FOR SLEEP     Psychiatry:  Antidepressants - Heterocyclics (TCAs) Failed - 07/07/2023  1:44 PM      Failed - Valid encounter within last 6 months    Recent Outpatient Visits           1 year ago Pelvic pain   [facility] Tanya Nones, Priscille Heidelberg, MD   1 year ago Viral upper respiratory tract infection   [facility] Pickard, Priscille Heidelberg, MD   1 year ago Elevated serum creatinine   [facility] Tanya Nones, Priscille Heidelberg, MD   2 years ago Cervical radiculopathy   [facility] [name], MD   2 years ago Diarrhea, unspecified type   [facility] Valentino Nose, NP              Passed - Completed PHQ-2 or PHQ-9 in the last 360 days

## 2023-07-10 ENCOUNTER — Telehealth: Payer: Self-pay

## 2023-07-10 NOTE — Telephone Encounter (Signed)
Called and left message for patient to call office.(Nurse Note* Need to know if patient was able to get her Restoril. If not what pharmacy is he using?)

## 2023-07-10 NOTE — Telephone Encounter (Signed)
Called Patient and left message to call office. Nurse Note follow up to check if patient was able to pick up medication from pharmacy.

## 2023-07-10 NOTE — Telephone Encounter (Signed)
Called and spoke with patient and she has not been able to get her temazepam (RESTORIL) 30 MG capsule called [facility] pharmacy in [place] patient PA.

## 2023-07-10 NOTE — Telephone Encounter (Signed)
See telephone note.

## 2023-07-12 ENCOUNTER — Telehealth: Payer: Self-pay

## 2023-07-12 NOTE — Telephone Encounter (Signed)
Called and spoke with pharmacy and patient needs PA. Patient is aware.

## 2023-07-18 ENCOUNTER — Ambulatory Visit: Payer: BC Managed Care – PPO | Admitting: Family Medicine

## 2023-07-22 ENCOUNTER — Encounter: Payer: Self-pay | Admitting: Family Medicine

## 2023-07-23 ENCOUNTER — Other Ambulatory Visit: Payer: Self-pay | Admitting: Family Medicine

## 2023-07-23 DIAGNOSIS — F410 Panic disorder [episodic paroxysmal anxiety] without agoraphobia: Secondary | ICD-10-CM

## 2023-07-23 DIAGNOSIS — F411 Generalized anxiety disorder: Secondary | ICD-10-CM

## 2023-07-23 DIAGNOSIS — F320 Major depressive disorder, single episode, mild: Secondary | ICD-10-CM

## 2023-07-24 NOTE — Telephone Encounter (Signed)
Requested medication (s) are due for refill today: Yes  Requested medication (s) are on the active medication list: Yes  Last refill:  06/18/23  Future visit scheduled: No  Notes to clinic:  Unable to refill per protocol, cannot delegate.      Requested Prescriptions  Pending Prescriptions Disp Refills   clonazePAM (KLONOPIN) 1 MG tablet [Pharmacy Med Name: CLONAZEPAM 1 MG TABLET] 20 tablet 1    Sig: TAKE 1 TABLET BY MOUTH TWICE A DAY AS NEEDED FOR ANXIETY     Not Delegated - Psychiatry: Anxiolytics/Hypnotics 2 Failed - 07/23/2023  3:30 PM      Failed - This refill cannot be delegated      Failed - Urine Drug Screen completed in last 360 days      Failed - Valid encounter within last 6 months    Recent Outpatient Visits           1 year ago Pelvic pain   [facility] [name], MD   1 year ago Viral upper respiratory tract infection   [facility] Tanya Nones, Priscille Heidelberg, MD   2 years ago Elevated serum creatinine   [facility] [name], MD   2 years ago Cervical radiculopathy   [facility] [name], MD   2 years ago Diarrhea, unspecified type   [facility] Valentino Nose, NP              Passed - Patient is not pregnant      Signed Prescriptions Disp Refills   DULoxetine (CYMBALTA) 60 MG capsule 90 capsule 0    Sig: TAKE 1 CAPSULE BY MOUTH EVERY DAY     Psychiatry: Antidepressants - SNRI - duloxetine Failed - 07/23/2023  3:30 PM      Failed - Cr in normal range and within 360 days    Creat  Date Value Ref Range Status  10/03/2021 0.91 0.50 - 0.99 mg/dL Final   Creatinine, Ser  Date Value Ref Range Status  07/25/2022 0.89 0.57 - 1.00 mg/dL Final   Creatinine, Urine  Date Value Ref Range Status  07/26/2021 43 20 - 275 mg/dL Final         Failed - eGFR is 30 or above and within 360 days    GFR, Est African American  Date Value Ref Range Status   08/24/2020 93 > OR = 60 mL/min/1.39m2 Final   GFR, Est Non African American  Date Value Ref Range Status  08/24/2020 80 > OR = 60 mL/min/1.68m2 Final   GFR, Estimated  Date Value Ref Range Status  09/27/2021 >60 >60 mL/min Final    Comment:    (NOTE) Calculated using the CKD-EPI Creatinine Equation (2021)    eGFR  Date Value Ref Range Status  07/25/2022 83 >59 mL/min/1.73 Final         Failed - Completed PHQ-2 or PHQ-9 in the last 360 days      Failed - Valid encounter within last 6 months    Recent Outpatient Visits           1 year ago Pelvic pain   [facility] [name], MD   1 year ago Viral upper respiratory tract infection   [facility] [name], MD   2 years ago Elevated serum creatinine   [facility] [name], MD   2 years ago Cervical radiculopathy  [facility] Pickard, Priscille Heidelberg, MD   2 years ago Diarrhea, unspecified type   [facility] Medicine Cathlean Marseilles A, NP              Passed - Last BP in normal range    BP Readings from Last 1 Encounters:  02/23/23 126/74

## 2023-07-24 NOTE — Telephone Encounter (Signed)
Last OV 02/23/23 Requested Prescriptions  Pending Prescriptions Disp Refills   DULoxetine (CYMBALTA) 60 MG capsule [Pharmacy Med Name: DULOXETINE HCL DR 60 MG CAP] 90 capsule 0    Sig: TAKE 1 CAPSULE BY MOUTH EVERY DAY     Psychiatry: Antidepressants - SNRI - duloxetine Failed - 07/23/2023  3:30 PM      Failed - Cr in normal range and within 360 days    Creat  Date Value Ref Range Status  10/03/2021 0.91 0.50 - 0.99 mg/dL Final   Creatinine, Ser  Date Value Ref Range Status  07/25/2022 0.89 0.57 - 1.00 mg/dL Final   Creatinine, Urine  Date Value Ref Range Status  07/26/2021 43 20 - 275 mg/dL Final         Failed - eGFR is 30 or above and within 360 days    GFR, Est African American  Date Value Ref Range Status  08/24/2020 93 > OR = 60 mL/min/1.44m2 Final   GFR, Est Non African American  Date Value Ref Range Status  08/24/2020 80 > OR = 60 mL/min/1.44m2 Final   GFR, Estimated  Date Value Ref Range Status  09/27/2021 >60 >60 mL/min Final    Comment:    (NOTE) Calculated using the CKD-EPI Creatinine Equation (2021)    eGFR  Date Value Ref Range Status  07/25/2022 83 >59 mL/min/1.73 Final         Failed - Completed PHQ-2 or PHQ-9 in the last 360 days      Failed - Valid encounter within last 6 months    Recent Outpatient Visits           1 year ago Pelvic pain   [facility] Tanya Nones, Priscille Heidelberg, MD   1 year ago Viral upper respiratory tract infection   [facility] [name], MD   2 years ago Elevated serum creatinine   [facility] Tanya Nones, Priscille Heidelberg, MD   2 years ago Cervical radiculopathy   [facility] [name], MD   2 years ago Diarrhea, unspecified type   [facility] Cathlean Marseilles A, NP              Passed - Last BP in normal range    BP Readings from Last 1 Encounters:  02/23/23 126/74          clonazePAM (KLONOPIN) 1 MG tablet [Pharmacy Med Name:  CLONAZEPAM 1 MG TABLET] 20 tablet 1    Sig: TAKE 1 TABLET BY MOUTH TWICE A DAY AS NEEDED FOR ANXIETY     Not Delegated - Psychiatry: Anxiolytics/Hypnotics 2 Failed - 07/23/2023  3:30 PM      Failed - This refill cannot be delegated      Failed - Urine Drug Screen completed in last 360 days      Failed - Valid encounter within last 6 months    Recent Outpatient Visits           1 year ago Pelvic pain   [facility] [name], MD   1 year ago Viral upper respiratory tract infection   [facility] [name], MD   2 years ago Elevated serum creatinine   [facility] Tanya Nones, Priscille Heidelberg, MD   2 years ago Cervical radiculopathy   [facility] Tanya Nones Priscille Heidelberg, MD   2 years ago Diarrhea, unspecified type   [facility]  Valentino Nose, NP              Passed - Patient is not pregnant

## 2023-07-25 ENCOUNTER — Other Ambulatory Visit: Payer: Self-pay | Admitting: Neurology

## 2023-07-25 ENCOUNTER — Telehealth: Payer: Self-pay | Admitting: Family Medicine

## 2023-07-25 NOTE — Telephone Encounter (Signed)
LVM and sent mychart msg informing pt of need to reschedule 07/26/23 appt - NP out

## 2023-07-25 NOTE — Telephone Encounter (Signed)
Amy, NP approved using HOSP FU to reschedule pt's appt on 09/11/23 at 1:00pm

## 2023-07-25 NOTE — Telephone Encounter (Signed)
Contacted the patient to confirm 09/11/23 at 1:00 pm. Pt accepted appt.

## 2023-07-25 NOTE — Telephone Encounter (Signed)
Pt called back after rescheduling appt, can not come on 07/30/23 due transportation would be able to bring at that late notice. Spoke with nurse; she said she would work on it and call pt.

## 2023-07-25 NOTE — Progress Notes (Deleted)
No chief complaint on file.    HISTORY OF PRESENT ILLNESS:  07/25/23 ALL:  Virginia [name] is a 42 y.o. female here today for follow up for RRMS. She continues Kesimpta injections monthly.  MRI brain 03/2022 stable.   Gait is stable. No falls. She walks her dog everyday for about 5-10 minutes. She feels that balance is stable. She reports that she is having dizzy spells. She reports this started 2 months ago. Occurs about once a week. She reports feeling that "everything is spinning". Has vomited twice while having an event. She has had similar events in the past that was treated with meclizine and that really helps her. No vision changes.   Pain is stable. She continues baclofen 10mg  3-5 times daily and gabapentin 600mg  TID  She was referred to NS by PCP following cervical MRI showing severe foraminal stenosis at C6-C7.   Headaches are worse. She admits that she has not used Amovig (started in 03/2021 by PCP) regularly but does feel it was helping. She is having 4-5 migraines per month. Sumatriptan does help abort migraine.   Sleep is decent. Sleep time varies. She usually goes to bed around 3am and wakes around 9-10am. She is taking temazepam and amitriptyline every night. Mood is stable on clonazepam and duloxetine. These medications managed by PCP.    HISTORY (copied from Dr Bonnita Hollow previous note)  Virginia [name] is a 42 y.o. woman with relapsing remitting multiple sclerosis who also has back pain.   Update  01/04/2023: She went back to Tysabri as she tolerated it much better than Kesimpta  Last JCV 06/2022 was 0.17 negative.   One was drawn yesterday and is pending.      She denies any exacerbation.   Last Tysabri ws 01/03/2023   She is tolerating it well   MRI of the brain 6/112023 showed  T2/FLAIR hyperintense foci in the cerebral hemispheres and the left medulla in a pattern consistent with chronic demyelinating plaque associated with multiple sclerosis.  None of the foci appear to  be acute.  Compared to the MRI dated 01/20/2020, there are no new lesions.   Gait is about the same with mildly reduced balance.   She stumbles but no recent falls.  She uses the bannister going downstairs   She can walk > 1/2 without a break but does not keep up well with others.  .  She notes spasticity and cramping in her legs unrelated to position or activity Baclofen 20 mg po bid has helped.     She has burning in her legs, right > left helped some by gabapentin 600 mg po qid and amitriptyline 50 mg nightly.    No vertigo.     She denies hearing loss.    Bladder function is fine.    Vision is fine.  She never had ON but had nystagmus in past, none recently   She notes fatigue is her main problem and some days she just wants to stay in bed.    She notes sleep is poor at night even with amitriptlne and gabapentin.    She has more trouble staying asleep though sleep onset is ok.    Mood is fine.      She has low back pain, worse when she stands and some pain down le.  She has a disc herniation at L5-S1.  She also has neck pain but not arm pain.     Her chronic migraine is doing worse.  The  Aimovig was discontinued as her co-pay was going to be very high.  She is still on amitriptyline.  When she was on both Aimovig and amitriptyline the migraines were doing very well.   She gets a few a month still and takes sumatriptan.   [name]   She lost some last year     MS History:   In mid May 2017, she was starting to experience occasional lightheadedness and a spinning vertigo. On 03/19/2016, she had more extreme vertigo and had an episode of syncope. She notices that when she was walking she would veer towards the right. Because of the syncope, she was taken to the emergency room. He had an MRI of the brain that showed several enhancing lesions, potentially worrisome for multiple sclerosis. Additional studies were performed. She received 3 days of IV steroids. The MRI's of the spine did not show any  additional plaques. The lumbar puncture showed oligoclonal bands with normal IgG index, consistent with multiple sclerosis.   She started  Aubagio.   MRI in 2018 showed additional foci plus she had tolerability issues.   She has been on Tysabri since 12/26/2016.  She switched to [facility] 07/2021   She went back to Tysabri 07/2022.       LABS/LP She underwent a lumbar puncture on 03/21/2016. 4 oligoclonal bands present in the CSF which were not present in the serum, consistent with multiple sclerosis. The IgG index was high normal at 0.6.   There were only 4 white blood cells but 280 red blood cells more consistent with a slightly traumatic tap.    The [facility] Spotted Fever CSF IgG was negative but the IgM was positive at 1.41 (less than 0.9 normal).    ANA and ANCA were both negative.   IMAGING MRI images from 03/20/2016, 03/22/2016 and CT angiogram images from 03/23/2016 reviewed: The MRI of the brain shows several 2/fair hyperintense foci, some in the periventricular and juxtacortical white matter. Most of the foci enhanced after contrast administration. MRI of the cervical and thoracic spine did not show any spinal cord plaques and there was no significant degenerative change.   CT angiogram was essentially normal showing no significant stenosis.   MRI 11/28/2016 showed multiple periventricular, subcortical, pericallosal as well as craniovertebral junction white matter hyperintensities compatible with multiple sclerosis. Multiple enhancing lesions are noted in the periventricular regions with the largest one in the left posterior frontal subcortical white matter. Incidental large venous angioma is noted in the right parietal region. Compared with MRI scan dated 03/20/2016 there are several new enhancing as well as nonenhancing white matter lesions.   MRI 12/25/2016 of the cervical spine showed two T2 hyperintense foci within the left posterolateral spinal cord, one adjacent to C6-C7 and one adjacent  to T1. There may have been subtle signal within the spinal cord adjacent to C6-C7 on the previous scan but the current focus is clearly larger. The focus adjacent to T1 was not evident on the previous MRI. Neither of these 2 foci enhanced with contrast.         There is mild spinal stenosis at C4-C5, C5-C6 and C6-C7, unchanged when compared to the previous MRI. There is mild right foraminal narrowing at C6-C7 but there is no nerve root compression.   MRI 01/10/2018 of the brain showed multiple T2/FLAIR hyperintense foci in the brainstem and in the periventricular, juxtacortical and deep white matter in a pattern and configuration consistent with chronic demyelinating plaque associated with multiple  sclerosis.  Compared to the MRI dated 11/28/2016, there is an improved appearance in the lesions that were enhancing at that time no longer do so.  There are no new lesions.   MRI [facility] 01/27/2018 showed, at L4-L5, there is stable mild left facet hypertrophy and mild ligamentum flavum hypertrophy.  There is no nerve root compression.     At L5-S1, there is a stable small central disc herniation causing bilateral lateral recess narrowing.  This contacts both of the S1 nerve roots.  There is no definite change when compared to the previous MRI.   MRI brain 01/17/2019 showed multiple T2/flair hyperintense foci in the hemispheres in a pattern and configuration consistent with chronic demyelinating plaque associated with multiple sclerosis.  Two foci that have been seen previously in the brainstem are not apparent on the current MRI.  There are no new lesions compared to the 2019 MRI.  None of the foci enhance.    There is a small developmental venous anomaly in the right parietal lobe that is unchanged in appearance.  Otherwise, the enhancement pattern is normal.   MRI of the brain 01/20/2020 showed scattered T2/FLAIR hyperintense foci in the hemispheres and medulla in a pattern and configuration consistent with chronic  demyelinating plaque associated with multiple sclerosis.   Compared to the MRI from 01/17/2019, there are no new lesions.   MRI of the brain 6/112023 showed  T2/FLAIR hyperintense foci in the cerebral hemispheres and the left medulla in a pattern consistent with chronic demyelinating plaque associated with multiple sclerosis.  None of the foci appear to be acute.  Compared to the MRI dated 01/20/2020, there are no new lesions.   REVIEW OF SYSTEMS: Out of a complete 14 system review of symptoms, the patient complains only of the following symptoms, headaches, chronic pain, depression, muscle cramps, fatigue, insomnia, dizziness and all other reviewed systems are negative.   ALLERGIES: Allergies  Allergen Reactions   Codeine Other (See Comments)    Hallucinations    Onion Swelling   Topamax [Topiramate]     Was talking out of her head, walked into walls     HOME MEDICATIONS: Outpatient Medications Prior to Visit  Medication Sig Dispense Refill   atenolol (TENORMIN) 50 MG tablet Take 1 tablet (50 mg total) by mouth daily. 90 tablet 0   baclofen (LIORESAL) 10 MG tablet TAKE 2 TABLETS BY MOUTH 3 TIMES A DAY FOR SPASTICITY 540 tablet 0   buPROPion (WELLBUTRIN XL) 150 MG 24 hr tablet TAKE 1 TABLET BY MOUTH EVERY DAY 30 tablet 0   clonazePAM (KLONOPIN) 1 MG tablet TAKE 1 TABLET BY MOUTH TWICE A DAY AS NEEDED FOR ANXIETY 20 tablet 1   DULoxetine (CYMBALTA) 60 MG capsule TAKE 1 CAPSULE BY MOUTH EVERY DAY 90 capsule 0   ergocalciferol (VITAMIN D2) 1.25 MG (50000 UT) capsule Take 50,000 Units by mouth every 30 (thirty) days.     etodolac (LODINE) 400 MG tablet TAKE 1 TABLET BY MOUTH TWICE A DAY 60 tablet 3   gabapentin (NEURONTIN) 600 MG tablet TAKE 1 TABLET BY MOUTH 4 TIMES DAILY. 120 tablet 0   HAILEY 24 FE 1-20 MG-MCG(24) tablet TAKE 1 TABLET BY MOUTH EVERY DAY (Patient taking differently: Take 1 tablet by mouth at bedtime.) 84 tablet 3   hyoscyamine (LEVSIN SL) 0.125 MG SL tablet Place 1 tablet  (0.125 mg total) under the tongue every 6 (six) hours as needed. (Patient taking differently: Place 0.125 mg under the tongue every 6 (six)  hours as needed for cramping.) 30 tablet 2   KESIMPTA 20 MG/0.4ML SOAJ Inject 0.4 mLs into the skin every 30 (thirty) days. 1.2 mL 2   meclizine (ANTIVERT) 25 MG tablet Take 1 tablet (25 mg total) by mouth 3 (three) times daily as needed for dizziness. 30 tablet 11   pantoprazole (PROTONIX) 40 MG tablet TAKE 1 TABLET BY MOUTH TWICE A DAY (Patient taking differently: Take 40 mg by mouth 2 (two) times daily as needed.) 180 tablet 0   predniSONE (DELTASONE) 20 MG tablet 3 tabs poqday 1-2, 2 tabs poqday 3-4, 1 tab poqday 5-6 12 tablet 0   sucralfate (CARAFATE) 1 g tablet TAKE 1 TABLET BY MOUTH 4 TIMES DAILY - WITH MEALS AND AT BEDTIME. (Patient taking differently: Take 1 g by mouth as needed.) 120 tablet 1   SUMAtriptan (IMITREX) 50 MG tablet TAKE 1 TABLET EVERY 2 HRS AS NEEDED FOR MIGRAINE. MAY REPEAT IN 2 HRS IF HEADACHE PERSISTS OR RECURS 10 tablet 5   temazepam (RESTORIL) 30 MG capsule Take 1 capsule (30 mg total) by mouth at bedtime as needed for sleep. TAKE 1 CAPSULE AT BEDTIME AS NEEDED FOR SLEEP (DO NOT TAKE WITH KLONOPIN). 30 capsule 0   No facility-administered medications prior to visit.     PAST MEDICAL HISTORY: Past Medical History:  Diagnosis Date   Arthritis    Chronic migraine w/o aura w/o status migrainosus, not intractable    followed by [name]   GAD (generalized anxiety disorder)    GERD (gastroesophageal reflux disease)    Hemorrhoids    History of cervical dysplasia    followed by GYN--- [name]. harris;    hx CIN 2  s/p  colposcopy in office 05/ 2022 and CIN 3  s/p LEEP 11/ 2022   History of syncope 02/2016   admission in epic;  in setting of vertigo and dx w/ MS   Insomnia    Multiple sclerosis, relapsing-remitting (HCC) 02/2016   neurologist--- [name];  dx 05/ 2017 lumbar puncture,  dyesthesia, fatigue, vertigo   PAC (premature  atrial contraction)    PSVT (paroxysmal supraventricular tachycardia) 2019   (02-01-2023 pt stated followed by pcp) // cardiologist--- dr g. taylor;  (lov in epic 06-21-2018)  evaluated 2019 after dx MS in 2017 with syncope/ palpitations;   event monitor showed PSVT,  PACs/ PVCs   PVC (premature ventricular contraction)    Sleep apnea, unspecified    study in epic 08-07-2018  borderline sleep apnea,  recommendation loss wt, sleep hygiene, mouth guard     PAST SURGICAL HISTORY: Past Surgical History:  Procedure Laterality Date   COLONOSCOPY WITH ESOPHAGOGASTRODUODENOSCOPY (EGD)  11/15/2022   [name];   EVALUATION UNDER ANESTHESIA WITH HEMORRHOIDECTOMY N/A 02/08/2023   Procedure: ANORECTAL EXAM UNDER ANESTHESIA;  Surgeon: [name], MD;  Location: [facility] [place];  Service: General;  Laterality: N/A;   HEMORRHOID SURGERY  02/08/2023   Procedure: EXTERNAL HEMORRHOIDECTOMY X2;  Surgeon: [name], MD;  Location: [facility];  Service: General;;   LEEP N/A 09/20/2021   Procedure: LOOP ELECTROSURGICAL EXCISION PROCEDURE (LEEP);  Surgeon: [name], MD;  Location: [facility];  Service: Gynecology;  Laterality: N/A;     FAMILY HISTORY: Family History  Problem Relation Age of Onset   Emphysema Mother    Osteoarthritis Mother    Anxiety disorder Mother    Uterine cancer Mother 65   Other Father    Suicidality Father  Breast cancer Maternal Aunt    Breast cancer Maternal Aunt    Breast cancer Maternal Aunt    ALS Maternal Grandmother    Colon cancer Neg Hx    Colon polyps Neg Hx    Esophageal cancer Neg Hx    Rectal cancer Neg Hx    Stomach cancer Neg Hx      SOCIAL HISTORY: Social History   Socioeconomic History   Marital status: Married    Spouse name: Not on file   Number of children: Not on file   Years of education: Not on file   Highest education level: 12th grade  Occupational History   Not on file  Tobacco Use    Smoking status: Former    Current packs/day: 0.00    Average packs/day: 0.5 packs/day for 10.0 years (5.0 ttl pk-yrs)    Types: Cigarettes    Start date: 2005    Quit date: 2015    Years since quitting: 9.7   Smokeless tobacco: Never  Vaping Use   Vaping status: Every Day   Substances: Nicotine, Flavoring, Nicotine-salt   Devices: unsure name  Substance and Sexual Activity   Alcohol use: Not Currently    Comment: rarely   Drug use: Never   Sexual activity: Not Currently    Birth control/protection: Pill  Other Topics Concern   Not on file  Social History Narrative   Not on file   Social Determinants of Health   Financial Resource Strain: High Risk (02/19/2023)   Overall Financial Resource Strain (CARDIA)    Difficulty of Paying Living Expenses: Hard  Food Insecurity: Food Insecurity Present (02/19/2023)   Hunger Vital Sign    Worried About Running Out of Food in the Last Year: Sometimes true    Ran Out of Food in the Last Year: Sometimes true  Transportation Needs: No Transportation Needs (02/19/2023)   PRAPARE - Administrator, Civil Service (Medical): No    Lack of Transportation (Non-Medical): No  Physical Activity: Unknown (02/19/2023)   Exercise Vital Sign    Days of Exercise per Week: Patient declined    Minutes of Exercise per Session: Not on file  Stress: Stress Concern Present (02/19/2023)   [employer] of Occupational Health - Occupational Stress Questionnaire    Feeling of Stress : Rather much  Social Connections: Unknown (02/19/2023)   Social Connection and Isolation Panel [NHANES]    Frequency of Communication with Friends and Family: More than three times a week    Frequency of Social Gatherings with Friends and Family: Once a week    Attends Religious Services: Patient declined    Database administrator or Organizations: Patient declined    Attends Banker Meetings: Not on file    Marital Status: Separated  Intimate Partner  Violence: Not on file     PHYSICAL EXAM  There were no vitals filed for this visit.  There is no height or weight on file to calculate BMI.   Generalized: Well developed, in no acute distress  Cardiology: normal rate and rhythm, no murmur auscultated  Respiratory: clear to auscultation bilaterally    Neurological examination  Mentation: Alert oriented to time, place, history taking. Follows all commands speech and language fluent Cranial nerve II-XII: Pupils were equal round reactive to light. Extraocular movements were full, visual field were full on confrontational test. Facial sensation and strength were normal. Uvula tongue midline. Head turning and shoulder shrug  were normal and symmetric. Motor: The  motor testing reveals 5 over 5 strength of all 4 extremities. Good symmetric motor tone is noted throughout.  Sensory: Sensory testing is intact to soft touch on all 4 extremities. No evidence of extinction is noted.  Coordination: Cerebellar testing reveals good finger-nose-finger and heel-to-shin bilaterally.  Gait and station: Gait is normal.  Reflexes: Deep tendon reflexes are symmetric and normal bilaterally.    DIAGNOSTIC DATA (LABS, IMAGING, TESTING) - I reviewed patient records, labs, notes, testing and imaging myself where available.  Lab Results  Component Value Date   WBC 7.9 01/03/2023   HGB 14.5 01/03/2023   HCT 42.7 01/03/2023   MCV 100 (H) 01/03/2023   PLT 296 01/03/2023      Component Value Date/Time   NA 140 07/25/2022 1558   K 3.7 07/25/2022 1558   CL 104 07/25/2022 1558   CO2 21 07/25/2022 1558   GLUCOSE 104 (H) 07/25/2022 1558   GLUCOSE 120 (H) 10/03/2021 1645   BUN 12 07/25/2022 1558   CREATININE 0.89 07/25/2022 1558   CREATININE 0.91 10/03/2021 1645   CALCIUM 8.9 07/25/2022 1558   PROT 5.7 (L) 07/25/2022 1558   ALBUMIN 3.7 (L) 07/25/2022 1558   AST 15 07/25/2022 1558   ALT 34 (H) 07/25/2022 1558   ALKPHOS 77 07/25/2022 1558   BILITOT <0.2  07/25/2022 1558   GFRNONAA >60 09/27/2021 1656   GFRNONAA 80 08/24/2020 1643   GFRAA 93 08/24/2020 1643   Lab Results  Component Value Date   CHOL 109 07/25/2022   HDL 35 (L) 07/25/2022   LDLCALC 58 07/25/2022   TRIG 76 07/25/2022   CHOLHDL 3.1 07/25/2022   Lab Results  Component Value Date   HGBA1C 5.3 07/25/2022   No results found for: "VITAMINB12" Lab Results  Component Value Date   TSH 1.280 07/25/2022        No data to display               No data to display           ASSESSMENT AND PLAN  42 y.o. year old female  has a past medical history of Arthritis, Chronic migraine w/o aura w/o status migrainosus, not intractable, GAD (generalized anxiety disorder), GERD (gastroesophageal reflux disease), Hemorrhoids, History of cervical dysplasia, History of syncope (02/2016), Insomnia, Multiple sclerosis, relapsing-remitting (HCC) (02/2016), PAC (premature atrial contraction), PSVT (paroxysmal supraventricular tachycardia) (2019), PVC (premature ventricular contraction), and Sleep apnea, unspecified. here with    No diagnosis found.  Timi is doing fairly well, today. We will continue Tysabri infusions every 4 weeks. I will update labs, today. I will have her continue baclofen, gabapentin and sumatriptan as prescribed. I have encouraged her to take Amovig injections every 30 days. May take 4-6 months to be fully effective. She may take meclizine as needed for vertigo. 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 Consider exercise regimen. She will follow up with [name] in 6 months.    No orders of the defined types were placed in this encounter.     No orders of the defined types were placed in this encounter.      Shawnie Dapper, MSN, FNP-C 07/25/2023, 7:48 AM  [facility] [address] [place], [place] [address] [phone]

## 2023-07-25 NOTE — Telephone Encounter (Signed)
Last seen on 01/04/23 Follow up scheduled on 09/11/23

## 2023-07-26 ENCOUNTER — Ambulatory Visit: Payer: BC Managed Care – PPO | Admitting: Family Medicine

## 2023-07-31 ENCOUNTER — Other Ambulatory Visit: Payer: Self-pay | Admitting: Family Medicine

## 2023-07-31 ENCOUNTER — Other Ambulatory Visit: Payer: Self-pay | Admitting: Obstetrics and Gynecology

## 2023-07-31 MED ORDER — ZOLPIDEM TARTRATE 10 MG PO TABS: 10.0000 mg | ORAL_TABLET | Freq: Every evening | ORAL | 0 refills | Status: DC | PRN

## 2023-08-01 NOTE — Telephone Encounter (Signed)
Unable to refill per protocol, Rx expired. Discontinued 02/23/23.  Requested Prescriptions  Pending Prescriptions Disp Refills   amitriptyline (ELAVIL) 50 MG tablet [Pharmacy Med Name: AMITRIPTYLINE HCL 50 MG TAB] 90 tablet 3    Sig: TAKE 1 TABLET BY MOUTH AT BEDTIME AS NEEDED FOR SLEEP     Psychiatry:  Antidepressants - Heterocyclics (TCAs) Failed - 07/31/2023 12:59 PM      Failed - Completed PHQ-2 or PHQ-9 in the last 360 days      Failed - Valid encounter within last 6 months    Recent Outpatient Visits           1 year ago Pelvic pain   [facility] [name], MD   1 year ago Viral upper respiratory tract infection   [facility] [name], MD   2 years ago Elevated serum creatinine   [facility] Tanya Nones, Priscille Heidelberg, MD   2 years ago Cervical radiculopathy   [facility] [name], MD   2 years ago Diarrhea, unspecified type   [facility] Valentino Nose, NP

## 2023-08-05 ENCOUNTER — Other Ambulatory Visit: Payer: Self-pay | Admitting: Neurology

## 2023-08-06 ENCOUNTER — Telehealth: Payer: Self-pay

## 2023-08-06 ENCOUNTER — Encounter: Payer: Self-pay | Admitting: Obstetrics and Gynecology

## 2023-08-06 NOTE — Telephone Encounter (Signed)
Last seen on 01/04/23 Follow up scheduled on 09/11/23

## 2023-08-06 NOTE — Telephone Encounter (Signed)
Faxed off Discontinuation Questionnaire form to [facility] at [phone] on 08/06/2023.

## 2023-08-07 MED ORDER — HAILEY 24 FE 1-20 MG-MCG(24) PO TABS: 1.0000 | ORAL_TABLET | Freq: Every day | ORAL | 0 refills | Status: DC

## 2023-08-08 ENCOUNTER — Other Ambulatory Visit: Payer: Self-pay | Admitting: Neurology

## 2023-08-08 NOTE — Telephone Encounter (Signed)
Last seen on 01/04/23 Follow up scheduled on 09/11/23

## 2023-08-13 NOTE — Progress Notes (Unsigned)
PCP:  [name], MD   No chief complaint on file.    HPI:      [name] is a 42 y.o. G1P1001 whose LMP was No LMP recorded., presents today for her annual examination.  Her menses are regular every 28-30 days, lasting 7 days, light flow. Dysmenorrhea mild, improved with NSAIDs. She has been having 2 wks of dark brown "goo" the past 2 months before menses starts, no late/missed OCPs. Pt started on OCPs a few yrs ago for BTB with Dr. Tiburcio Pea. Worked until recently. No u/s done in past. No recent thyroid labs.   Sex activity: not sexually active. On OCPs for cycle control.  Last Pap: 07/25/22 Results were normal, neg HPV DNA 01/18/22  Results were: no abnormalities  09/20/21 LEEP with CIN 2 and 3 with Dr. Tiburcio Pea 08/31/21 [facility]  5/22 Cryotx 02/28/21 colpo bx with CIN 2 01/31/21  LSGIL/pos HPV DNA  Has a skin tag RT vulvar area, getting larger. Uncomfortable/painful for pt. Would like removed.  Last mammogram: 06/05/22 Results were: normal--routine follow-up in 12 months There is a FH of breast cancer in 3 mat grt aunts, genetic testing not done. There is no FH of ovarian cancer. The patient does occas do self-breast exams.  Tobacco use: vapes daily Alcohol use: none No drug use.  Exercise: moderately active  She does get adequate calcium and Vitamin D in her diet. No recent fasting labs  Has had a skin tag RT vulvar area for a few yrs that is now increasing in size and painful. Would like it removed.   Patient Active Problem List   Diagnosis Date Noted   Family history of breast cancer 07/25/2022   Chronic migraine w/o aura w/o status migrainosus, not intractable 09/01/2021   Dysplasia of cervix, high grade CIN 2 03/18/2021   LGSIL on Pap smear of cervix 02/28/2021   Vertigo of central origin 06/24/2020   Numbness 06/24/2020   Panic disorder 11/11/2019   Current mild episode of major depressive disorder without prior episode (HCC) 11/11/2019   Snoring 06/20/2018    Excessive daytime sleepiness 06/20/2018   Neck pain 01/01/2018   Palpitations 01/01/2018   Myalgia 09/03/2017   Chronic low back pain without sciatica 09/03/2017   Right leg pain 07/26/2017   Bilateral sciatica 07/12/2017   Nystagmus 02/15/2017   Spell of altered consciousness 07/19/2016   Ataxic gait 05/31/2016   Other fatigue 05/31/2016   Dysesthesia 05/31/2016   High risk medication use 03/31/2016   Insomnia 03/31/2016   Dizziness    Syncope    Multiple sclerosis (HCC) 03/21/2016   Relapsing remitting multiple sclerosis (HCC)    Vertigo    White matter abnormality on MRI of brain 03/20/2016   Faintness     Past Surgical History:  Procedure Laterality Date   COLONOSCOPY WITH ESOPHAGOGASTRODUODENOSCOPY (EGD)  11/15/2022   [name];   EVALUATION UNDER ANESTHESIA WITH HEMORRHOIDECTOMY N/A 02/08/2023   Procedure: ANORECTAL EXAM UNDER ANESTHESIA;  Surgeon: [name], MD;  Location: [facility];  Service: General;  Laterality: N/A;   HEMORRHOID SURGERY  02/08/2023   Procedure: EXTERNAL HEMORRHOIDECTOMY X2;  Surgeon: [name], MD;  Location: [facility];  Service: General;;   LEEP N/A 09/20/2021   Procedure: LOOP ELECTROSURGICAL EXCISION PROCEDURE (LEEP);  Surgeon: [name], MD;  Location: [facility];  Service: Gynecology;  Laterality: N/A;    Family History  Problem Relation Age of Onset   Emphysema  Mother    Osteoarthritis Mother    Anxiety disorder Mother    Uterine cancer Mother 17   Other Father    Suicidality Father    Breast cancer Maternal Aunt    Breast cancer Maternal Aunt    Breast cancer Maternal Aunt    ALS Maternal Grandmother    Colon cancer Neg Hx    Colon polyps Neg Hx    Esophageal cancer Neg Hx    Rectal cancer Neg Hx    Stomach cancer Neg Hx     Social History   Socioeconomic History   Marital status: Married    Spouse name: Not on file   Number of children: Not on file   Years of  education: Not on file   Highest education level: 12th grade  Occupational History   Not on file  Tobacco Use   Smoking status: Former    Current packs/day: 0.00    Average packs/day: 0.5 packs/day for 10.0 years (5.0 ttl pk-yrs)    Types: Cigarettes    Start date: 2005    Quit date: 2015    Years since quitting: 9.7   Smokeless tobacco: Never  Vaping Use   Vaping status: Every Day   Substances: Nicotine, Flavoring, Nicotine-salt   Devices: unsure name  Substance and Sexual Activity   Alcohol use: Not Currently    Comment: rarely   Drug use: Never   Sexual activity: Not Currently    Birth control/protection: Pill  Other Topics Concern   Not on file  Social History Narrative   Not on file   Social Determinants of Health   Financial Resource Strain: High Risk (02/19/2023)   Overall Financial Resource Strain (CARDIA)    Difficulty of Paying Living Expenses: Hard  Food Insecurity: Food Insecurity Present (02/19/2023)   Hunger Vital Sign    Worried About Running Out of Food in the Last Year: Sometimes true    Ran Out of Food in the Last Year: Sometimes true  Transportation Needs: No Transportation Needs (02/19/2023)   PRAPARE - Administrator, Civil Service (Medical): No    Lack of Transportation (Non-Medical): No  Physical Activity: Unknown (02/19/2023)   Exercise Vital Sign    Days of Exercise per Week: Patient declined    Minutes of Exercise per Session: Not on file  Stress: Stress Concern Present (02/19/2023)   [employer] of Occupational Health - Occupational Stress Questionnaire    Feeling of Stress : Rather much  Social Connections: Unknown (02/19/2023)   Social Connection and Isolation Panel [NHANES]    Frequency of Communication with Friends and Family: More than three times a week    Frequency of Social Gatherings with Friends and Family: Once a week    Attends Religious Services: Patient declined    Database administrator or Organizations: Patient  declined    Attends Engineer, structural: Not on file    Marital Status: Separated  Intimate Partner Violence: Not on file     Current Outpatient Medications:    atenolol (TENORMIN) 50 MG tablet, Take 1 tablet (50 mg total) by mouth daily., Disp: 90 tablet, Rfl: 0   baclofen (LIORESAL) 10 MG tablet, TAKE 2 TABLETS BY MOUTH 3 TIMES A DAY FOR SPASTICITY, Disp: 540 tablet, Rfl: 0   buPROPion (WELLBUTRIN XL) 150 MG 24 hr tablet, TAKE 1 TABLET BY MOUTH EVERY DAY, Disp: 90 tablet, Rfl: 0   clonazePAM (KLONOPIN) 1 MG tablet, TAKE 1 TABLET BY MOUTH  TWICE A DAY AS NEEDED FOR ANXIETY, Disp: 20 tablet, Rfl: 1   DULoxetine (CYMBALTA) 60 MG capsule, TAKE 1 CAPSULE BY MOUTH EVERY DAY, Disp: 90 capsule, Rfl: 0   ergocalciferol (VITAMIN D2) 1.25 MG (50000 UT) capsule, Take 50,000 Units by mouth every 30 (thirty) days., Disp: , Rfl:    etodolac (LODINE) 400 MG tablet, TAKE 1 TABLET BY MOUTH TWICE A DAY, Disp: 60 tablet, Rfl: 1   gabapentin (NEURONTIN) 600 MG tablet, TAKE 1 TABLET BY MOUTH FOUR TIMES A DAY, Disp: 120 tablet, Rfl: 0   hyoscyamine (LEVSIN SL) 0.125 MG SL tablet, Place 1 tablet (0.125 mg total) under the tongue every 6 (six) hours as needed. (Patient taking differently: Place 0.125 mg under the tongue every 6 (six) hours as needed for cramping.), Disp: 30 tablet, Rfl: 2   KESIMPTA 20 MG/0.4ML SOAJ, Inject 0.4 mLs into the skin every 30 (thirty) days., Disp: 1.2 mL, Rfl: 2   meclizine (ANTIVERT) 25 MG tablet, Take 1 tablet (25 mg total) by mouth 3 (three) times daily as needed for dizziness., Disp: 30 tablet, Rfl: 11   Norethindrone Acetate-Ethinyl Estrad-FE (HAILEY 24 FE) 1-20 MG-MCG(24) tablet, Take 1 tablet by mouth daily., Disp: 84 tablet, Rfl: 0   pantoprazole (PROTONIX) 40 MG tablet, TAKE 1 TABLET BY MOUTH TWICE A DAY (Patient taking differently: Take 40 mg by mouth 2 (two) times daily as needed.), Disp: 180 tablet, Rfl: 0   predniSONE (DELTASONE) 20 MG tablet, 3 tabs poqday 1-2, 2  tabs poqday 3-4, 1 tab poqday 5-6, Disp: 12 tablet, Rfl: 0   sucralfate (CARAFATE) 1 g tablet, TAKE 1 TABLET BY MOUTH 4 TIMES DAILY - WITH MEALS AND AT BEDTIME. (Patient taking differently: Take 1 g by mouth as needed.), Disp: 120 tablet, Rfl: 1   SUMAtriptan (IMITREX) 50 MG tablet, TAKE 1 TABLET EVERY 2 HRS AS NEEDED FOR MIGRAINE. MAY REPEAT IN 2 HRS IF HEADACHE PERSISTS OR RECURS, Disp: 10 tablet, Rfl: 5   zolpidem (AMBIEN) 10 MG tablet, Take 1 tablet (10 mg total) by mouth at bedtime as needed for sleep. This replaces temazepam, Disp: 30 tablet, Rfl: 0     ROS:  Review of Systems  Constitutional:  Negative for fatigue, fever and unexpected weight change.  Respiratory:  Negative for cough, shortness of breath and wheezing.   Cardiovascular:  Negative for chest pain, palpitations and leg swelling.  Gastrointestinal:  Negative for blood in stool, constipation, diarrhea, nausea and vomiting.  Endocrine: Negative for cold intolerance, heat intolerance and polyuria.  Genitourinary:  Negative for dyspareunia, dysuria, flank pain, frequency, genital sores, hematuria, menstrual problem, pelvic pain, urgency, vaginal bleeding, vaginal discharge and vaginal pain.  Musculoskeletal:  Negative for back pain, joint swelling and myalgias.  Skin:  Negative for rash.  Neurological:  Negative for dizziness, syncope, light-headedness, numbness and headaches.  Hematological:  Negative for adenopathy.  Psychiatric/Behavioral:  Negative for agitation, confusion, sleep disturbance and suicidal ideas. The patient is not nervous/anxious.    BREAST: No symptoms   Objective: There were no vitals taken for this visit.   Physical Exam Constitutional:      Appearance: She is well-developed.  Genitourinary:     Vulva normal.     Right Labia: No rash, tenderness or lesions.    Left Labia: No tenderness, lesions or rash.    No vaginal discharge, erythema or tenderness.      Right Adnexa: not tender and no  mass present.    Left Adnexa: not tender  and no mass present.    No cervical friability or polyp.     Uterus is not enlarged or tender.  Breasts:    Right: No mass, nipple discharge, skin change or tenderness.     Left: No mass, nipple discharge, skin change or tenderness.  Neck:     Thyroid: No thyromegaly.  Cardiovascular:     Rate and Rhythm: Normal rate and regular rhythm.     Heart sounds: Normal heart sounds. No murmur heard. Pulmonary:     Effort: Pulmonary effort is normal.     Breath sounds: Normal breath sounds.  Abdominal:     Palpations: Abdomen is soft.     Tenderness: There is no abdominal tenderness. There is no guarding or rebound.  Musculoskeletal:        General: Normal range of motion.     Cervical back: Normal range of motion.  Lymphadenopathy:     Cervical: No cervical adenopathy.  Neurological:     General: No focal deficit present.     Mental Status: She is alert and oriented to person, place, and time.     Cranial Nerves: No cranial nerve deficit.  Skin:    General: Skin is warm and dry.  Psychiatric:        Mood and Affect: Mood normal.        Behavior: Behavior normal.        Thought Content: Thought content normal.        Judgment: Judgment normal.  Vitals reviewed.    SKIN TAG REMOVAL  Pt would like skin tag removed. Risk of infection and bleeding discussed.  The patient's vulva was prepped with alcohol wipe. 1% lidocaine was injected into area of concern. The skin tag  was picked up with sterile forceps and sterile scissors were used to excise the lesion.  Small bleeding was noted and hemostasis was achieved using silver nitrate sticks.  The patient tolerated the procedure well. Post-procedure wound care instructions were given and infection signs discussed.   Assessment/Plan: Encounter for annual routine gynecological examination  Cervical cancer screening - Plan: Cytology - PAP  Screening for HPV (human papillomavirus) - Plan: Cytology -  PAP  Dysplasia of cervix, high grade CIN 2 - Plan: Cytology - PAP; repeat pap today. Will f/u with results and mgmt  Encounter for surveillance of contraceptive pills--eval BTB first. If neg, will change OCPs. Pt has enough Rx for now.   Encounter for screening mammogram for malignant neoplasm of breast; pt current on mammo  Family history of breast cancer--MyRisk testing discussed and pt to f/u if desires.   Breakthrough bleeding on OCPs - Plan: US PELVIS TRANSVAGINAL NON-OB (TV ONLY);check labs and GYN u/s. If neg, will change OCPs.  Thyroid disorder screening - Plan: TSH, T4, free  Blood tests for routine general physical examination - Plan: TSH, T4, free, Comprehensive metabolic panel, Hemoglobin A1c, Lipid panel  Screening cholesterol level - Plan: Lipid panel  Screening for diabetes mellitus - Plan: Hemoglobin A1c  BMI 36.0-36.9,adult - Plan: TSH, T4, free, Comprehensive metabolic panel, Hemoglobin A1c, Lipid panel  Skin tag - Plan: Surgical pathology; skin tag removed successfully. F/u prn            GYN counsel breast self exam, mammography screening, adequate intake of calcium and vitamin D, diet and exercise     F/U  No follow-ups on file.  Hanin Decook B. Shoua Ulloa, PA-C 08/13/2023 7:40 PM

## 2023-08-14 ENCOUNTER — Encounter: Payer: Self-pay | Admitting: Obstetrics and Gynecology

## 2023-08-14 ENCOUNTER — Other Ambulatory Visit (HOSPITAL_COMMUNITY)
Admission: RE | Admit: 2023-08-14 | Discharge: 2023-08-14 | Disposition: A | Discharge status: Home / Self Care | Payer: BC Managed Care – PPO | Source: Ambulatory Visit | Attending: Obstetrics and Gynecology | Admitting: Obstetrics and Gynecology

## 2023-08-14 ENCOUNTER — Ambulatory Visit (INDEPENDENT_AMBULATORY_CARE_PROVIDER_SITE_OTHER): Payer: BC Managed Care – PPO | Admitting: Obstetrics and Gynecology

## 2023-08-14 VITALS — BP 110/72 | Ht 64.0 in | Wt 209.0 lb

## 2023-08-14 DIAGNOSIS — Z1231 Encounter for screening mammogram for malignant neoplasm of breast: Secondary | ICD-10-CM

## 2023-08-14 DIAGNOSIS — Z3041 Encounter for surveillance of contraceptive pills: Secondary | ICD-10-CM

## 2023-08-14 DIAGNOSIS — N871 Moderate cervical dysplasia: Secondary | ICD-10-CM

## 2023-08-14 DIAGNOSIS — Z01419 Encounter for gynecological examination (general) (routine) without abnormal findings: Secondary | ICD-10-CM

## 2023-08-14 DIAGNOSIS — Z1151 Encounter for screening for human papillomavirus (HPV): Secondary | ICD-10-CM | POA: Diagnosis not present

## 2023-08-14 DIAGNOSIS — Z124 Encounter for screening for malignant neoplasm of cervix: Secondary | ICD-10-CM

## 2023-08-14 DIAGNOSIS — Z803 Family history of malignant neoplasm of breast: Secondary | ICD-10-CM

## 2023-08-14 DIAGNOSIS — D219 Benign neoplasm of connective and other soft tissue, unspecified: Secondary | ICD-10-CM | POA: Insufficient documentation

## 2023-08-14 DIAGNOSIS — N921 Excessive and frequent menstruation with irregular cycle: Secondary | ICD-10-CM

## 2023-08-14 MED ORDER — DROSPIRENONE-ETHINYL ESTRADIOL 3-0.02 MG PO TABS
1.0000 | ORAL_TABLET | Freq: Every day | ORAL | 0 refills | Status: DC
Start: 2023-08-14 — End: 2023-11-13

## 2023-08-14 NOTE — Patient Instructions (Signed)
I value your feedback and you entrusting [place] with your care. If you get a [facility] patient survey, I would appreciate you taking the time to let us know about your experience today. Thank you!  [facility] ([place])--[phone]

## 2023-08-21 ENCOUNTER — Ambulatory Visit: Payer: BC Managed Care – PPO | Admitting: Family Medicine

## 2023-08-21 DIAGNOSIS — G4700 Insomnia, unspecified: Secondary | ICD-10-CM

## 2023-08-21 MED ORDER — CLONAZEPAM 1 MG PO TABS
1.0000 mg | ORAL_TABLET | Freq: Two times a day (BID) | ORAL | 3 refills | Status: DC | PRN
Start: 2023-08-21 — End: 2024-02-11

## 2023-08-21 MED ORDER — WEGOVY 0.5 MG/0.5ML ~~LOC~~ SOAJ: 0.5000 mg | SUBCUTANEOUS | 1 refills | Status: DC

## 2023-08-21 NOTE — Progress Notes (Signed)
Subjective:    Patient ID: Virginia [name], female    DOB: 1981-06-23, 42 y.o.   MRN: [id number] Patient has called frustrated several times over the last several months due to her inability to get temazepam.  This was denied by her insurance.  She was taking 1 temazepam every evening.  Without the temazepam she is unable to sleep.  She has tried and failed amitriptyline.  She has tried trazodone in the past.  She has tried Ambien.  She states that Ambien caused her to slur her speech and to act confused.  Takes Klonopin for anxiety.  She takes this once or twice a week.  When she takes Klonopin seems to help her insomnia. Past Medical History:  Diagnosis Date   Arthritis    Chronic migraine w/o aura w/o status migrainosus, not intractable    followed by [name]   GAD (generalized anxiety disorder)    GERD (gastroesophageal reflux disease)    Hemorrhoids    History of cervical dysplasia    followed by GYN--- [name]. harris;    hx CIN 2  s/p  colposcopy in office 05/ 2022 and CIN 3  s/p LEEP 11/ 2022   History of syncope 02/2016   admission in epic;  in setting of vertigo and dx w/ MS   Insomnia    Multiple sclerosis, relapsing-remitting (HCC) 02/2016   neurologist--- [name];  dx 05/ 2017 lumbar puncture,  dyesthesia, fatigue, vertigo   PAC (premature atrial contraction)    PSVT (paroxysmal supraventricular tachycardia) (HCC) 2019   (02-01-2023 pt stated followed by pcp) // cardiologist--- dr g. taylor;  (lov in epic 06-21-2018)  evaluated 2019 after dx MS in 2017 with syncope/ palpitations;   event monitor showed PSVT,  PACs/ PVCs   PVC (premature ventricular contraction)    Sleep apnea, unspecified    study in epic 08-07-2018  borderline sleep apnea,  recommendation loss wt, sleep hygiene, mouth guard   Past Surgical History:  Procedure Laterality Date   COLONOSCOPY WITH ESOPHAGOGASTRODUODENOSCOPY (EGD)  11/15/2022   [name];   EVALUATION UNDER ANESTHESIA WITH HEMORRHOIDECTOMY N/A  02/08/2023   Procedure: ANORECTAL EXAM UNDER ANESTHESIA;  Surgeon: [name], MD;  Location: [facility];  Service: General;  Laterality: N/A;   HEMORRHOID SURGERY  02/08/2023   Procedure: EXTERNAL HEMORRHOIDECTOMY X2;  Surgeon: [name], MD;  Location: [facility];  Service: General;;   LEEP N/A 09/20/2021   Procedure: LOOP ELECTROSURGICAL EXCISION PROCEDURE (LEEP);  Surgeon: [name], MD;  Location: [facility];  Service: Gynecology;  Laterality: N/A;   Current Outpatient Medications on File Prior to Visit  Medication Sig Dispense Refill   amitriptyline (ELAVIL) 50 MG tablet Take 50 mg by mouth at bedtime as needed.     atenolol (TENORMIN) 50 MG tablet Take 1 tablet (50 mg total) by mouth daily. 90 tablet 0   baclofen (LIORESAL) 10 MG tablet TAKE 2 TABLETS BY MOUTH 3 TIMES A DAY FOR SPASTICITY 540 tablet 0   buPROPion (WELLBUTRIN XL) 150 MG 24 hr tablet TAKE 1 TABLET BY MOUTH EVERY DAY 90 tablet 0   drospirenone-ethinyl estradiol (YAZ) 3-0.02 MG tablet Take 1 tablet by mouth daily. 84 tablet 0   DULoxetine (CYMBALTA) 60 MG capsule TAKE 1 CAPSULE BY MOUTH EVERY DAY 90 capsule 0   etodolac (LODINE) 400 MG tablet TAKE 1 TABLET BY MOUTH TWICE A DAY 60 tablet 1   gabapentin (NEURONTIN) 600 MG tablet TAKE  1 TABLET BY MOUTH FOUR TIMES A DAY 120 tablet 0   KESIMPTA 20 MG/0.4ML SOAJ Inject 0.4 mLs into the skin every 30 (thirty) days. 1.2 mL 2   meclizine (ANTIVERT) 25 MG tablet Take 1 tablet (25 mg total) by mouth 3 (three) times daily as needed for dizziness. 30 tablet 11   pantoprazole (PROTONIX) 40 MG tablet TAKE 1 TABLET BY MOUTH TWICE A DAY (Patient taking differently: Take 40 mg by mouth 2 (two) times daily as needed.) 180 tablet 0   sucralfate (CARAFATE) 1 g tablet TAKE 1 TABLET BY MOUTH 4 TIMES DAILY - WITH MEALS AND AT BEDTIME. (Patient taking differently: Take 1 g by mouth as needed.) 120 tablet 1   SUMAtriptan (IMITREX) 50 MG tablet  TAKE 1 TABLET EVERY 2 HRS AS NEEDED FOR MIGRAINE. MAY REPEAT IN 2 HRS IF HEADACHE PERSISTS OR RECURS 10 tablet 5   No current facility-administered medications on file prior to visit.   Allergies  Allergen Reactions   Codeine Other (See Comments)    Hallucinations    Onion Swelling   Topamax [Topiramate]     Was talking out of her head, walked into walls   Social History   Socioeconomic History   Marital status: Married    Spouse name: Not on file   Number of children: Not on file   Years of education: Not on file   Highest education level: 12th grade  Occupational History   Not on file  Tobacco Use   Smoking status: Former    Current packs/day: 0.00    Average packs/day: 0.5 packs/day for 10.0 years (5.0 ttl pk-yrs)    Types: Cigarettes    Start date: 2005    Quit date: 2015    Years since quitting: 9.8   Smokeless tobacco: Never  Vaping Use   Vaping status: Every Day   Substances: Nicotine, Flavoring, Nicotine-salt   Devices: unsure name  Substance and Sexual Activity   Alcohol use: Yes    Comment: rarely   Drug use: Never   Sexual activity: Not Currently    Birth control/protection: Pill  Other Topics Concern   Not on file  Social History Narrative   Not on file   Social Determinants of Health   Financial Resource Strain: Medium Risk (08/17/2023)   Overall Financial Resource Strain (CARDIA)    Difficulty of Paying Living Expenses: Somewhat hard  Food Insecurity: Food Insecurity Present (08/17/2023)   Hunger Vital Sign    Worried About Running Out of Food in the Last Year: Sometimes true    Ran Out of Food in the Last Year: Often true  Transportation Needs: No Transportation Needs (08/17/2023)   PRAPARE - Administrator, Civil Service (Medical): No    Lack of Transportation (Non-Medical): No  Physical Activity: Insufficiently Active (08/17/2023)   Exercise Vital Sign    Days of Exercise per Week: 3 days    Minutes of Exercise per Session: 10  min  Stress: Stress Concern Present (08/17/2023)   [employer] of Occupational Health - Occupational Stress Questionnaire    Feeling of Stress : Very much  Social Connections: Socially Isolated (08/17/2023)   Social Connection and Isolation Panel [NHANES]    Frequency of Communication with Friends and Family: More than three times a week    Frequency of Social Gatherings with Friends and Family: Never    Attends Religious Services: Never    Database administrator or Organizations: No    Attends  Banker Meetings: Not on file    Marital Status: Separated  Intimate Partner Violence: Not on file      Review of Systems  All other systems reviewed and are negative.      Objective:   Physical Exam Constitutional:      Appearance: Normal appearance. She is obese.  Cardiovascular:     Rate and Rhythm: Normal rate and regular rhythm.     Heart sounds: Normal heart sounds. No murmur heard. Pulmonary:     Effort: Pulmonary effort is normal. No respiratory distress.     Breath sounds: Normal breath sounds. No stridor. No wheezing, rhonchi or rales.  Neurological:     General: No focal deficit present.     Mental Status: She is alert and oriented to person, place, and time. Mental status is at baseline.     Cranial Nerves: No cranial nerve deficit.         Assessment & Plan:  Insomnia, unspecified type - Plan: clonazePAM (KLONOPIN) 1 MG tablet Patient has tried and failed trazodone, amitriptyline, Ambien.  Insurance is not covering her temazepam.  I will increase her Klonopin to 30 tablets/month.  This will allow her 1 tablet at night as needed for insomnia.  She can also use this occasionally for anxiety.  May need to increase to 45 tablets depending upon how often she is taking the Klonopin.

## 2023-08-27 LAB — CYTOLOGY - PAP: Diagnosis: NEGATIVE

## 2023-08-31 ENCOUNTER — Other Ambulatory Visit: Payer: Self-pay | Admitting: Family Medicine

## 2023-08-31 NOTE — Telephone Encounter (Signed)
Refused Trazodone 100 mg because it was discontinued 03/23/2023.

## 2023-09-05 ENCOUNTER — Other Ambulatory Visit: Payer: Self-pay | Admitting: Neurology

## 2023-09-05 DIAGNOSIS — Z6834 Body mass index (BMI) 34.0-34.9, adult: Secondary | ICD-10-CM | POA: Insufficient documentation

## 2023-09-05 NOTE — Telephone Encounter (Signed)
Last seen on 01/04/23 Follow up scheduled on 09/11/23

## 2023-09-05 NOTE — Progress Notes (Signed)
Chief Complaint  Patient presents with   Room 1    Pt is here Alone. Pt states that things have been okay with her MS. Pt states she doesn't have any new questions or concerns to discuss today.     HISTORY OF PRESENT ILLNESS:  09/11/23 ALL:  Virginia [name] is a 42 y.o. female here today for follow up for RRMS. She continues Kesimpta. She started Kesimpta 07/2021 but switched back to Tysabri 07/2022 as she felt it was better tolerated. Last JCV 1.22, positive 12/2022 and she was switched back to Kesimpta. MRI brain 03/2022 stable.   She feels she is fairly stable. No obvious exacerbating symptoms. She feels gait is poor. She reports 15-20 falls over the past 6 months. She feels that her feet just don't move as they should. No significant weakness of ext. She is not using any assistive devices.   Generalized pain is stable. She continues baclofen 10mg  3-5 times daily and gabapentin 600mg  QID. She is having more neck pain. She was referred to NS by PCP following cervical MRI 04/2021 showing severe foraminal stenosis at C6-C7. She was never seen.   She is having right shoulder pain that started 2-3 weeks ago. She described as a sharp stabbing pain when lifting or rotating arm. She takes etodolac for neck pain and feels it helps with should pain as well. She has not seen PCP regarding shoulder pain.   Headaches are stable. She is having 4-5 migraines per month. Sumatriptan does help abort migraine. Meclizine helps with occasional vertigo.   Sleep is poor. She is always tired. She was referred to a sleep specialist. Sleep time varies. She usually goes to bed around 3am and wakes around 9-10am. She reports Dr Tanya Nones stopped amitriptyline due to fatigue. Mood is stable on clonazepam and duloxetine. These medications managed by PCP. Wellbutrin started by [employer] for fatigue about 6 months ago. She does not feel it has helped.    HISTORY (copied from Dr Bonnita Hollow previous note)  Virginia [name] is a 42  y.o. woman with relapsing remitting multiple sclerosis who also has back pain.   Update  01/04/2023: She went back to Tysabri as she tolerated it much better than Kesimpta  Last JCV 06/2022 was 0.17 negative.   One was drawn yesterday and is pending.      She denies any exacerbation.   Last Tysabri ws 01/03/2023   She is tolerating it well   MRI of the brain 6/112023 showed  T2/FLAIR hyperintense foci in the cerebral hemispheres and the left medulla in a pattern consistent with chronic demyelinating plaque associated with multiple sclerosis.  None of the foci appear to be acute.  Compared to the MRI dated 01/20/2020, there are no new lesions.   Gait is about the same with mildly reduced balance.   She stumbles but no recent falls.  She uses the bannister going downstairs   She can walk > 1/2 without a break but does not keep up well with others.  .  She notes spasticity and cramping in her legs unrelated to position or activity Baclofen 20 mg po bid has helped.     She has burning in her legs, right > left helped some by gabapentin 600 mg po qid and amitriptyline 50 mg nightly.    No vertigo.     She denies hearing loss.    Bladder function is fine.    Vision is fine.  She never had ON but had nystagmus  in past, none recently   She notes fatigue is her main problem and some days she just wants to stay in bed.    She notes sleep is poor at night even with amitriptlne and gabapentin.    She has more trouble staying asleep though sleep onset is ok.    Mood is fine.      She has low back pain, worse when she stands and some pain down le.  She has a disc herniation at L5-S1.  She also has neck pain but not arm pain.     Her chronic migraine is doing worse.  The Aimovig was discontinued as her co-pay was going to be very high.  She is still on amitriptyline.  When she was on both Aimovig and amitriptyline the migraines were doing very well.   She gets a few a month still and takes sumatriptan.   [name]    She lost some last year     MS History:   In mid May 2017, she was starting to experience occasional lightheadedness and a spinning vertigo. On 03/19/2016, she had more extreme vertigo and had an episode of syncope. She notices that when she was walking she would veer towards the right. Because of the syncope, she was taken to the emergency room. He had an MRI of the brain that showed several enhancing lesions, potentially worrisome for multiple sclerosis. Additional studies were performed. She received 3 days of IV steroids. The MRI's of the spine did not show any additional plaques. The lumbar puncture showed oligoclonal bands with normal IgG index, consistent with multiple sclerosis.   She started  Aubagio.   MRI in 2018 showed additional foci plus she had tolerability issues.   She has been on Tysabri since 12/26/2016.  She switched to [facility] 07/2021   She went back to Tysabri 07/2022.       LABS/LP She underwent a lumbar puncture on 03/21/2016. 4 oligoclonal bands present in the CSF which were not present in the serum, consistent with multiple sclerosis. The IgG index was high normal at 0.6.   There were only 4 white blood cells but 280 red blood cells more consistent with a slightly traumatic tap.    The [facility] Spotted Fever CSF IgG was negative but the IgM was positive at 1.41 (less than 0.9 normal).    ANA and ANCA were both negative.   IMAGING MRI images from 03/20/2016, 03/22/2016 and CT angiogram images from 03/23/2016 reviewed: The MRI of the brain shows several 2/fair hyperintense foci, some in the periventricular and juxtacortical white matter. Most of the foci enhanced after contrast administration. MRI of the cervical and thoracic spine did not show any spinal cord plaques and there was no significant degenerative change.   CT angiogram was essentially normal showing no significant stenosis.   MRI 11/28/2016 showed multiple periventricular, subcortical, pericallosal as well as  craniovertebral junction white matter hyperintensities compatible with multiple sclerosis. Multiple enhancing lesions are noted in the periventricular regions with the largest one in the left posterior frontal subcortical white matter. Incidental large venous angioma is noted in the right parietal region. Compared with MRI scan dated 03/20/2016 there are several new enhancing as well as nonenhancing white matter lesions.   MRI 12/25/2016 of the cervical spine showed two T2 hyperintense foci within the left posterolateral spinal cord, one adjacent to C6-C7 and one adjacent to T1. There may have been subtle signal within the spinal cord adjacent to C6-C7 on  the previous scan but the current focus is clearly larger. The focus adjacent to T1 was not evident on the previous MRI. Neither of these 2 foci enhanced with contrast.         There is mild spinal stenosis at C4-C5, C5-C6 and C6-C7, unchanged when compared to the previous MRI. There is mild right foraminal narrowing at C6-C7 but there is no nerve root compression.   MRI 01/10/2018 of the brain showed multiple T2/FLAIR hyperintense foci in the brainstem and in the periventricular, juxtacortical and deep white matter in a pattern and configuration consistent with chronic demyelinating plaque associated with multiple sclerosis.  Compared to the MRI dated 11/28/2016, there is an improved appearance in the lesions that were enhancing at that time no longer do so.  There are no new lesions.   MRI [facility] 01/27/2018 showed, at L4-L5, there is stable mild left facet hypertrophy and mild ligamentum flavum hypertrophy.  There is no nerve root compression.     At L5-S1, there is a stable small central disc herniation causing bilateral lateral recess narrowing.  This contacts both of the S1 nerve roots.  There is no definite change when compared to the previous MRI.   MRI brain 01/17/2019 showed multiple T2/flair hyperintense foci in the hemispheres in a pattern and  configuration consistent with chronic demyelinating plaque associated with multiple sclerosis.  Two foci that have been seen previously in the brainstem are not apparent on the current MRI.  There are no new lesions compared to the 2019 MRI.  None of the foci enhance.    There is a small developmental venous anomaly in the right parietal lobe that is unchanged in appearance.  Otherwise, the enhancement pattern is normal.   MRI of the brain 01/20/2020 showed scattered T2/FLAIR hyperintense foci in the hemispheres and medulla in a pattern and configuration consistent with chronic demyelinating plaque associated with multiple sclerosis. Compared to the MRI from 01/17/2019, there are no new lesions.   MRI of the brain 6/112023 showed  T2/FLAIR hyperintense foci in the cerebral hemispheres and the left medulla in a pattern consistent with chronic demyelinating plaque associated with multiple sclerosis.  None of the foci appear to be acute.  Compared to the MRI dated 01/20/2020, there are no new lesions.   REVIEW OF SYSTEMS: Out of a complete 14 system review of symptoms, the patient complains only of the following symptoms, headaches, chronic pain, depression, muscle cramps, fatigue, falls, insomnia, dizziness and all other reviewed systems are negative.   ALLERGIES: Allergies  Allergen Reactions   Codeine Other (See Comments)    Hallucinations    Onion Swelling   Topamax [Topiramate]     Was talking out of her head, walked into walls     HOME MEDICATIONS: Outpatient Medications Prior to Visit  Medication Sig Dispense Refill   amitriptyline (ELAVIL) 50 MG tablet Take 50 mg by mouth at bedtime as needed.     atenolol (TENORMIN) 50 MG tablet Take 1 tablet (50 mg total) by mouth daily. 90 tablet 0   baclofen (LIORESAL) 10 MG tablet TAKE 2 TABLETS BY MOUTH 3 TIMES A DAY FOR SPASTICITY 540 tablet 0   clonazePAM (KLONOPIN) 1 MG tablet Take 1 tablet (1 mg total) by mouth 2 (two) times daily as needed  for anxiety. 30 tablet 3   drospirenone-ethinyl estradiol (YAZ) 3-0.02 MG tablet Take 1 tablet by mouth daily. 84 tablet 0   DULoxetine (CYMBALTA) 60 MG capsule TAKE 1 CAPSULE BY MOUTH EVERY  DAY 90 capsule 0   KESIMPTA 20 MG/0.4ML SOAJ Inject 0.4 mLs into the skin every 30 (thirty) days. 1.2 mL 2   meclizine (ANTIVERT) 25 MG tablet Take 1 tablet (25 mg total) by mouth 3 (three) times daily as needed for dizziness. 30 tablet 11   pantoprazole (PROTONIX) 40 MG tablet TAKE 1 TABLET BY MOUTH TWICE A DAY (Patient taking differently: Take 40 mg by mouth 2 (two) times daily as needed.) 180 tablet 0   buPROPion (WELLBUTRIN XL) 150 MG 24 hr tablet TAKE 1 TABLET BY MOUTH EVERY DAY 90 tablet 0   etodolac (LODINE) 400 MG tablet TAKE 1 TABLET BY MOUTH TWICE A DAY 60 tablet 1   gabapentin (NEURONTIN) 600 MG tablet TAKE 1 TABLET BY MOUTH FOUR TIMES A DAY 120 tablet 0   SUMAtriptan (IMITREX) 50 MG tablet TAKE 1 TABLET EVERY 2 HRS AS NEEDED FOR MIGRAINE. MAY REPEAT IN 2 HRS IF HEADACHE PERSISTS OR RECURS 10 tablet 1   Semaglutide-Weight Management (WEGOVY) 0.5 MG/0.5ML SOAJ Inject 0.5 mg into the skin once a week. (Patient not taking: Reported on 09/11/2023) 2 mL 1   sucralfate (CARAFATE) 1 g tablet TAKE 1 TABLET BY MOUTH 4 TIMES DAILY - WITH MEALS AND AT BEDTIME. (Patient not taking: Reported on 09/11/2023) 120 tablet 1   No facility-administered medications prior to visit.     PAST MEDICAL HISTORY: Past Medical History:  Diagnosis Date   Arthritis    Chronic migraine w/o aura w/o status migrainosus, not intractable    followed by [name]   GAD (generalized anxiety disorder)    GERD (gastroesophageal reflux disease)    Hemorrhoids    History of cervical dysplasia    followed by GYN--- [name]. harris;    hx CIN 2  s/p  colposcopy in office 05/ 2022 and CIN 3  s/p LEEP 11/ 2022   History of syncope 02/2016   admission in epic;  in setting of vertigo and dx w/ MS   Insomnia    Multiple sclerosis,  relapsing-remitting (HCC) 02/2016   neurologist--- [name];  dx 05/ 2017 lumbar puncture,  dyesthesia, fatigue, vertigo   PAC (premature atrial contraction)    PSVT (paroxysmal supraventricular tachycardia) (HCC) 2019   (02-01-2023 pt stated followed by pcp) // cardiologist--- dr g. taylor;  (lov in epic 06-21-2018)  evaluated 2019 after dx MS in 2017 with syncope/ palpitations;   event monitor showed PSVT,  PACs/ PVCs   PVC (premature ventricular contraction)    Sleep apnea, unspecified    study in epic 08-07-2018  borderline sleep apnea,  recommendation loss wt, sleep hygiene, mouth guard     PAST SURGICAL HISTORY: Past Surgical History:  Procedure Laterality Date   COLONOSCOPY WITH ESOPHAGOGASTRODUODENOSCOPY (EGD)  11/15/2022   [name];   EVALUATION UNDER ANESTHESIA WITH HEMORRHOIDECTOMY N/A 02/08/2023   Procedure: ANORECTAL EXAM UNDER ANESTHESIA;  Surgeon: [name], MD;  Location: [facility];  Service: General;  Laterality: N/A;   HEMORRHOID SURGERY  02/08/2023   Procedure: EXTERNAL HEMORRHOIDECTOMY X2;  Surgeon: [name], MD;  Location: [facility];  Service: General;;   LEEP N/A 09/20/2021   Procedure: LOOP ELECTROSURGICAL EXCISION PROCEDURE (LEEP);  Surgeon: [name], MD;  Location: [facility];  Service: Gynecology;  Laterality: N/A;     FAMILY HISTORY: Family History  Problem Relation Age of Onset   Emphysema Mother    Osteoarthritis Mother    Anxiety disorder Mother  Uterine cancer Mother 62   Other Father    Suicidality Father    Breast cancer Maternal Aunt    Breast cancer Maternal Aunt    Breast cancer Maternal Aunt    ALS Maternal Grandmother    Colon cancer Neg Hx    Colon polyps Neg Hx    Esophageal cancer Neg Hx    Rectal cancer Neg Hx    Stomach cancer Neg Hx      SOCIAL HISTORY: Social History   Socioeconomic History   Marital status: Married    Spouse name: Not on file   Number of  children: Not on file   Years of education: Not on file   Highest education level: 12th grade  Occupational History   Not on file  Tobacco Use   Smoking status: Former    Current packs/day: 0.00    Average packs/day: 0.5 packs/day for 10.0 years (5.0 ttl pk-yrs)    Types: Cigarettes    Start date: 2005    Quit date: 2015    Years since quitting: 9.8   Smokeless tobacco: Never  Vaping Use   Vaping status: Every Day   Substances: Nicotine, Flavoring, Nicotine-salt   Devices: unsure name  Substance and Sexual Activity   Alcohol use: Yes    Comment: rarely   Drug use: Never   Sexual activity: Not Currently    Birth control/protection: Pill  Other Topics Concern   Not on file  Social History Narrative   Not on file   Social Determinants of Health   Financial Resource Strain: Medium Risk (08/17/2023)   Overall Financial Resource Strain (CARDIA)    Difficulty of Paying Living Expenses: Somewhat hard  Food Insecurity: Food Insecurity Present (08/17/2023)   Hunger Vital Sign    Worried About Running Out of Food in the Last Year: Sometimes true    Ran Out of Food in the Last Year: Often true  Transportation Needs: No Transportation Needs (08/17/2023)   PRAPARE - Administrator, Civil Service (Medical): No    Lack of Transportation (Non-Medical): No  Physical Activity: Insufficiently Active (08/17/2023)   Exercise Vital Sign    Days of Exercise per Week: 3 days    Minutes of Exercise per Session: 10 min  Stress: Stress Concern Present (08/17/2023)   [employer] of Occupational Health - Occupational Stress Questionnaire    Feeling of Stress : Very much  Social Connections: Socially Isolated (08/17/2023)   Social Connection and Isolation Panel [NHANES]    Frequency of Communication with Friends and Family: More than three times a week    Frequency of Social Gatherings with Friends and Family: Never    Attends Religious Services: Never    Doctor, general practice or Organizations: No    Attends Engineer, structural: Not on file    Marital Status: Separated  Intimate Partner Violence: Not on file     PHYSICAL EXAM  Vitals:   09/11/23 1259  BP: 116/81  Pulse: 79  Weight: 195 lb 8 oz (88.7 kg)  Height: 5\' 4"  (1.626 m)    Body mass index is 33.56 kg/m.   Generalized: Well developed, in no acute distress  Cardiology: normal rate and rhythm, no murmur auscultated  Respiratory: clear to auscultation bilaterally    Neurological examination  Mentation: Alert oriented to time, place, history taking. Follows all commands speech and language fluent Cranial nerve II-XII: Pupils were equal round reactive to light. Extraocular movements were full, visual  field were full on confrontational test. Facial sensation and strength were normal. Uvula tongue midline. Head turning and shoulder shrug  were normal and symmetric. Motor: The motor testing reveals 5 over 5 strength of all 4 extremities. Good symmetric motor tone is noted throughout.  Sensory: Sensory testing is intact to soft touch on all 4 extremities. No evidence of extinction is noted.  Coordination: Cerebellar testing reveals good finger-nose-finger and heel-to-shin bilaterally.  Gait and station: Gait is normal.  Reflexes: Deep tendon reflexes are symmetric and normal bilaterally.    DIAGNOSTIC DATA (LABS, IMAGING, TESTING) - I reviewed patient records, labs, notes, testing and imaging myself where available.  Lab Results  Component Value Date   WBC 7.9 01/03/2023   HGB 14.5 01/03/2023   HCT 42.7 01/03/2023   MCV 100 (H) 01/03/2023   PLT 296 01/03/2023      Component Value Date/Time   NA 140 07/25/2022 1558   K 3.7 07/25/2022 1558   CL 104 07/25/2022 1558   CO2 21 07/25/2022 1558   GLUCOSE 104 (H) 07/25/2022 1558   GLUCOSE 120 (H) 10/03/2021 1645   BUN 12 07/25/2022 1558   CREATININE 0.89 07/25/2022 1558   CREATININE 0.91 10/03/2021 1645   CALCIUM 8.9  07/25/2022 1558   PROT 5.7 (L) 07/25/2022 1558   ALBUMIN 3.7 (L) 07/25/2022 1558   AST 15 07/25/2022 1558   ALT 34 (H) 07/25/2022 1558   ALKPHOS 77 07/25/2022 1558   BILITOT <0.2 07/25/2022 1558   GFRNONAA >60 09/27/2021 1656   GFRNONAA 80 08/24/2020 1643   GFRAA 93 08/24/2020 1643   Lab Results  Component Value Date   CHOL 109 07/25/2022   HDL 35 (L) 07/25/2022   LDLCALC 58 07/25/2022   TRIG 76 07/25/2022   CHOLHDL 3.1 07/25/2022   Lab Results  Component Value Date   HGBA1C 5.3 07/25/2022   No results found for: "VITAMINB12" Lab Results  Component Value Date   TSH 1.280 07/25/2022        No data to display               No data to display           ASSESSMENT AND PLAN  42 y.o. year old female  has a past medical history of Arthritis, Chronic migraine w/o aura w/o status migrainosus, not intractable, GAD (generalized anxiety disorder), GERD (gastroesophageal reflux disease), Hemorrhoids, History of cervical dysplasia, History of syncope (02/2016), Insomnia, Multiple sclerosis, relapsing-remitting (HCC) (02/2016), PAC (premature atrial contraction), PSVT (paroxysmal supraventricular tachycardia) (HCC) (2019), PVC (premature ventricular contraction), and Sleep apnea, unspecified. here with    Relapsing remitting multiple sclerosis (HCC) - Plan: IgG, IgA, IgM, CBC with Differential/Platelets, Ambulatory referral to Physical Therapy  High risk medication use  Chronic migraine w/o aura w/o status migrainosus, not intractable  Other fatigue  Fall, initial encounter - Plan: Ambulatory referral to Physical Therapy  Virginia [name] is doing fairly well, today. We will continue Kesimpta, baclofen, etodolac, sumatriptan and gabapentin as prescribed. We will wean Wellbutrin as it is not effective. I will refer her to PT for gait eval and strengthening. I will update labs, today. She may take meclizine as needed for vertigo. She will work on Field seismologist intake and sleep  hygiene. Consider exercise regimen. She will follow up with [name] in 6 months.    Orders Placed This Encounter  Procedures   IgG, IgA, IgM   CBC with Differential/Platelets   Ambulatory referral to Physical Therapy  Referral Priority:   Routine    Referral Type:   Physical Medicine    Referral Reason:   Specialty Services Required    Requested Specialty:   Physical Therapy    Number of Visits Requested:   1      Meds ordered this encounter  Medications   gabapentin (NEURONTIN) 600 MG tablet    Sig: Take 1 tablet (600 mg total) by mouth 4 (four) times daily.    Dispense:  360 tablet    Refill:  1    Order Specific Question:   Supervising Provider    Answer:   Anson Fret [[id number]]   SUMAtriptan (IMITREX) 50 MG tablet    Sig: Take 1 tablet (50 mg total) by mouth daily as needed for migraine. May repeat in 2 hours if headache persists or recurs.    Dispense:  10 tablet    Refill:  11    Order Specific Question:   Supervising Provider    Answer:   Anson Fret [[id number]]   etodolac (LODINE) 400 MG tablet    Sig: Take 1 tablet (400 mg total) by mouth 2 (two) times daily.    Dispense:  180 tablet    Refill:  1    Order Specific Question:   Supervising Provider    Answer:   Anson Fret [[id number]]      EPP IRJJO, MSN, FNP-C 09/11/2023, 1:25 PM  [facility] [address], [address] [place], [place] [address] [phone]

## 2023-09-05 NOTE — Patient Instructions (Addendum)
Below is our plan:  We will current Kesimpta, baclofen, etodolac, and sumatriptan as prescribed. We will update labs, today. I will send you to neuro rehab for PT.   Wean Wellbutrin. Take 1 tablet every other day for 2-3 weeks, then decrease dose to three times a week for two weeks then stop.   Discuss shoulder pain with Dr Tanya Nones. You may want to consider seeing neurosurgery for neck pain as previously recommended by Dr Tanya Nones. Call to schedule visit with sleep specialist as directed by Dr Tanya Nones.   Please make sure you are staying well hydrated. I recommend 50-60 ounces daily. Well balanced diet and regular exercise encouraged. Consistent sleep schedule with 6-8 hours recommended.   Please continue follow up with care team as directed.   Follow up with [name] in 6 months   You may receive a survey regarding today's visit. I encourage you to leave honest feed back as I do use this information to improve patient care. Thank you for seeing me today!

## 2023-09-06 ENCOUNTER — Other Ambulatory Visit: Payer: Self-pay | Admitting: Neurology

## 2023-09-10 ENCOUNTER — Other Ambulatory Visit: Payer: Self-pay | Admitting: Family Medicine

## 2023-09-10 ENCOUNTER — Encounter: Payer: Self-pay | Admitting: Family Medicine

## 2023-09-10 DIAGNOSIS — G4700 Insomnia, unspecified: Secondary | ICD-10-CM

## 2023-09-11 ENCOUNTER — Encounter: Payer: Self-pay | Admitting: Family Medicine

## 2023-09-11 ENCOUNTER — Ambulatory Visit (INDEPENDENT_AMBULATORY_CARE_PROVIDER_SITE_OTHER): Payer: BC Managed Care – PPO | Admitting: Family Medicine

## 2023-09-11 ENCOUNTER — Telehealth: Payer: Self-pay

## 2023-09-11 VITALS — BP 116/81 | HR 79 | Ht 64.0 in | Wt 195.5 lb

## 2023-09-11 DIAGNOSIS — G43709 Chronic migraine without aura, not intractable, without status migrainosus: Secondary | ICD-10-CM | POA: Diagnosis not present

## 2023-09-11 DIAGNOSIS — R5383 Other fatigue: Secondary | ICD-10-CM | POA: Diagnosis not present

## 2023-09-11 DIAGNOSIS — G35 Multiple sclerosis: Secondary | ICD-10-CM

## 2023-09-11 DIAGNOSIS — Z79899 Other long term (current) drug therapy: Secondary | ICD-10-CM | POA: Diagnosis not present

## 2023-09-11 DIAGNOSIS — W19XXXA Unspecified fall, initial encounter: Secondary | ICD-10-CM

## 2023-09-11 MED ORDER — SUMATRIPTAN SUCCINATE 50 MG PO TABS: 50.0000 mg | ORAL_TABLET | Freq: Every day | ORAL | 11 refills | Status: DC | PRN

## 2023-09-11 MED ORDER — ETODOLAC 400 MG PO TABS: 400.0000 mg | ORAL_TABLET | Freq: Two times a day (BID) | ORAL | 1 refills | Status: DC

## 2023-09-11 MED ORDER — GABAPENTIN 600 MG PO TABS: 600.0000 mg | ORAL_TABLET | Freq: Four times a day (QID) | ORAL | 1 refills | Status: DC

## 2023-09-11 NOTE — Telephone Encounter (Signed)
Pt's insurance denied [place]. Thanks.

## 2023-09-12 LAB — CBC WITH DIFFERENTIAL/PLATELET
Basophils Absolute: 0 10*3/uL (ref 0.0–0.2)
Basos: 1 %
EOS (ABSOLUTE): 0.2 10*3/uL (ref 0.0–0.4)
Eos: 2 %
Hematocrit: 47.7 % — ABNORMAL HIGH (ref 34.0–46.6)
Hemoglobin: 16.2 g/dL — ABNORMAL HIGH (ref 11.1–15.9)
Immature Grans (Abs): 0 10*3/uL (ref 0.0–0.1)
Immature Granulocytes: 0 %
Lymphocytes Absolute: 2 10*3/uL (ref 0.7–3.1)
Lymphs: 26 %
MCH: 34 pg — ABNORMAL HIGH (ref 26.6–33.0)
MCHC: 34 g/dL (ref 31.5–35.7)
MCV: 100 fL — ABNORMAL HIGH (ref 79–97)
Monocytes Absolute: 0.5 10*3/uL (ref 0.1–0.9)
Monocytes: 7 %
Neutrophils Absolute: 5 10*3/uL (ref 1.4–7.0)
Neutrophils: 64 %
Platelets: 358 10*3/uL (ref 150–450)
RBC: 4.77 x10E6/uL (ref 3.77–5.28)
RDW: 11.4 % — ABNORMAL LOW (ref 11.7–15.4)
WBC: 7.7 10*3/uL (ref 3.4–10.8)

## 2023-09-12 LAB — IGG, IGA, IGM
IgA/Immunoglobulin A, Serum: 151 mg/dL (ref 87–352)
IgG (Immunoglobin G), Serum: 975 mg/dL (ref 586–1602)
IgM (Immunoglobulin M), Srm: 25 mg/dL — ABNORMAL LOW (ref 26–217)

## 2023-09-13 ENCOUNTER — Other Ambulatory Visit: Payer: Self-pay | Admitting: Obstetrics and Gynecology

## 2023-09-13 DIAGNOSIS — Z1231 Encounter for screening mammogram for malignant neoplasm of breast: Secondary | ICD-10-CM

## 2023-09-18 ENCOUNTER — Other Ambulatory Visit: Payer: Self-pay

## 2023-09-18 ENCOUNTER — Telehealth: Payer: Self-pay | Admitting: Family Medicine

## 2023-09-18 DIAGNOSIS — K219 Gastro-esophageal reflux disease without esophagitis: Secondary | ICD-10-CM

## 2023-09-18 MED ORDER — PANTOPRAZOLE SODIUM 40 MG PO TBEC: 40.0000 mg | DELAYED_RELEASE_TABLET | Freq: Two times a day (BID) | ORAL | 0 refills | Status: AC

## 2023-09-18 NOTE — Telephone Encounter (Signed)
Prescription Request  09/18/2023  LOV: 08/21/2023  What is the name of the medication or equipment?   pantoprazole (PROTONIX) 40 MG tablet  **90 day script request**  Have you contacted your pharmacy to request a refill? Yes   Which pharmacy would you like this sent to?  [id number] - [place], [place] - [address] AT [facility] [address]  [place] [address] Phone: [phone] Fax: [phone]    Patient notified that their request is being sent to the clinical staff for review and that they should receive a response within 2 business days.   Please advise pharmacist.

## 2023-09-20 ENCOUNTER — Ambulatory Visit: Payer: BC Managed Care – PPO | Attending: Family Medicine

## 2023-09-20 DIAGNOSIS — R2681 Unsteadiness on feet: Secondary | ICD-10-CM | POA: Diagnosis not present

## 2023-09-20 DIAGNOSIS — G35 Multiple sclerosis: Secondary | ICD-10-CM | POA: Insufficient documentation

## 2023-09-20 DIAGNOSIS — M6281 Muscle weakness (generalized): Secondary | ICD-10-CM | POA: Insufficient documentation

## 2023-09-20 DIAGNOSIS — R279 Unspecified lack of coordination: Secondary | ICD-10-CM | POA: Insufficient documentation

## 2023-09-20 DIAGNOSIS — W19XXXA Unspecified fall, initial encounter: Secondary | ICD-10-CM | POA: Insufficient documentation

## 2023-09-20 NOTE — Therapy (Addendum)
OUTPATIENT PHYSICAL THERAPY NEURO EVALUATION   Patient Name: Virginia [name] MRN: [id number] DOB:07/20/1981, 42 y.o., female Today's Date: 09/21/2023   PCP: [name], MD  REFERRING PROVIDER: Shawnie Dapper, NP  END OF SESSION:  PT End of Session - 09/20/23 1609     Visit Number 1    Number of Visits 9    Date for PT Re-Evaluation 10/19/23    Authorization Type Blue [employer] Blue [employer]    PT Start Time 1532    PT Stop Time 1609    PT Time Calculation (min) 37 min    Equipment Utilized During Treatment Gait belt    Activity Tolerance Patient tolerated treatment well             Past Medical History:  Diagnosis Date   Arthritis    Chronic migraine w/o aura w/o status migrainosus, not intractable    followed by [name]   GAD (generalized anxiety disorder)    GERD (gastroesophageal reflux disease)    Hemorrhoids    History of cervical dysplasia    followed by GYN--- dr Henderson [place];    hx CIN 2  s/p  colposcopy in office 05/ 2022 and CIN 3  s/p LEEP 11/ 2022   History of syncope 02/2016   admission in epic;  in setting of vertigo and dx w/ MS   Insomnia    Multiple sclerosis, relapsing-remitting (HCC) 02/2016   neurologist--- [name];  dx 05/ 2017 lumbar puncture,  dyesthesia, fatigue, vertigo   PAC (premature atrial contraction)    PSVT (paroxysmal supraventricular tachycardia) (HCC) 2019   (02-01-2023 pt stated followed by pcp) // cardiologist--- dr g. taylor;  (lov in epic 06-21-2018)  evaluated 2019 after dx MS in 2017 with syncope/ palpitations;   event monitor showed PSVT,  PACs/ PVCs   PVC (premature ventricular contraction)    Sleep apnea, unspecified    study in epic 08-07-2018  borderline sleep apnea,  recommendation loss wt, sleep hygiene, mouth guard   Past Surgical History:  Procedure Laterality Date   COLONOSCOPY WITH ESOPHAGOGASTRODUODENOSCOPY (EGD)  11/15/2022   [name];   EVALUATION UNDER ANESTHESIA WITH HEMORRHOIDECTOMY N/A 02/08/2023    Procedure: ANORECTAL EXAM UNDER ANESTHESIA;  Surgeon: [name], MD;  Location: [facility];  Service: General;  Laterality: N/A;   HEMORRHOID SURGERY  02/08/2023   Procedure: EXTERNAL HEMORRHOIDECTOMY X2;  Surgeon: [name], MD;  Location: [facility];  Service: General;;   LEEP N/A 09/20/2021   Procedure: LOOP ELECTROSURGICAL EXCISION PROCEDURE (LEEP);  Surgeon: [name], MD;  Location: [facility];  Service: Gynecology;  Laterality: N/A;   Patient Active Problem List   Diagnosis Date Noted   Body mass index (BMI) 34.0-34.9, adult 09/05/2023   Leiomyoma 08/14/2023   Family history of breast cancer 07/25/2022   Chronic migraine w/o aura w/o status migrainosus, not intractable 09/01/2021   Dysplasia of cervix, high grade CIN 2 03/18/2021   LGSIL on Pap smear of cervix 02/28/2021   Vertigo of central origin 06/24/2020   Numbness 06/24/2020   Panic disorder 11/11/2019   Current mild episode of major depressive disorder without prior episode (HCC) 11/11/2019   Snoring 06/20/2018   Excessive daytime sleepiness 06/20/2018   Neck pain 01/01/2018   Palpitations 01/01/2018   Myalgia 09/03/2017   Chronic low back pain without sciatica 09/03/2017   Right leg pain 07/26/2017   Bilateral sciatica 07/12/2017   Nystagmus 02/15/2017   Spell of altered consciousness  07/19/2016   Ataxic gait 05/31/2016   Other fatigue 05/31/2016   Dysesthesia 05/31/2016   High risk medication use 03/31/2016   Insomnia 03/31/2016   Dizziness    Syncope    Multiple sclerosis (HCC) 03/21/2016   Relapsing remitting multiple sclerosis (HCC)    Vertigo    White matter abnormality on MRI of brain 03/20/2016   Faintness     ONSET DATE: Referral  09/11/23  REFERRING DIAG: Diagnosis G35 (ICD-10-CM) - Relapsing remitting multiple sclerosis (HCC) W19.Lorne Skeens (ICD-10-CM) - Fall, initial encounter  THERAPY DIAG:  Unsteadiness on feet  Muscle weakness  (generalized)  Unspecified lack of coordination  Rationale for Evaluation and Treatment: Rehabilitation  SUBJECTIVE:                                                                                                                                                                                             SUBJECTIVE STATEMENT: Patient presented today by herself without AD. She said she has had 20-30 falls in the last 6 months and doesn't know why. She was diagnosed with RRMS in 2017. She was wearing flip flops today and wears slippers in the house. She reported she walks better in flip flops than other shoes. Never had PT for balance before, had it for dizziness. Dizziness episodes usually occur closer to when she is due for her next shot. No difference in falls morning vs night and she is always tired so does not notice a difference in falls with fatigue levels either.  She feels her legs shake when walking down an incline.  Pt accompanied by: self  PERTINENT HISTORY: RRMS, anxiety, arthritis  PAIN:  Are you having pain?  Dropped something on R foot earlier, back pain all the time  PRECAUTIONS: Fall  RED FLAGS: None   WEIGHT BEARING RESTRICTIONS: No  FALLS: Has patient fallen in last 6 months? Yes. Number of falls 20-30   LIVING ENVIRONMENT: Lives with: lives with their spouse Lives in: House/apartment Stairs: Yes: External: 4-5 steps; none Has following equipment at home: Crutches and Grab bars  PLOF: Independent  PATIENT GOALS: "strengthening up my legs"   OBJECTIVE:  Note: Objective measures were completed at Evaluation unless otherwise noted.  DIAGNOSTIC FINDINGS: Brain MRI 04/09/22 IMPRESSION: This MRI of the brain without contrast shows the following: 1.   T2/FLAIR hyperintense foci in the cerebral hemispheres and the left medulla in a pattern consistent with chronic demyelinating plaque associated with multiple sclerosis.  None of the foci appear to be acute.  Compared  to the MRI dated 01/20/2020, there are no new lesions. 2.   No acute findings.  COGNITION: Overall cognitive status: Within functional limits for tasks assessed   SENSATION: WFL  COORDINATION: WFL   POSTURE: No Significant postural limitations  LOWER EXTREMITY ROM:     WFL   LOWER EXTREMITY MMT:    MMT Right Eval Left Eval  Hip flexion 4 4  Hip extension    Hip abduction 3 3  Hip adduction 3 3  Hip internal rotation    Hip external rotation    Knee flexion 4 4  Knee extension 3 3  Ankle dorsiflexion 4 4  Ankle plantarflexion 4 4  Ankle inversion    Ankle eversion    (Blank rows = not tested)   GAIT: Gait pattern: step through pattern, decreased arm swing- Right, decreased arm swing- Left, decreased stride length, trendelenburg, lateral lean- Right, and decreased trunk rotation Distance walked: 115', various clinic distances  Assistive device utilized: None Level of assistance: Complete Independence Comments: no arm swing, significant R trunk lean, wears flip flops   FUNCTIONAL TESTS:     [facility] PT Assessment - 09/20/23 0001       Functional Gait  Assessment   Gait assessed  Yes    Gait Level Surface Walks 20 ft in less than 5.5 sec, no assistive devices, good speed, no evidence for imbalance, normal gait pattern, deviates no more than 6 in outside of the 12 in walkway width.    Change in Gait Speed Able to smoothly change walking speed without loss of balance or gait deviation. Deviate no more than 6 in outside of the 12 in walkway width.    Gait with Horizontal Head Turns Performs head turns smoothly with slight change in gait velocity (eg, minor disruption to smooth gait path), deviates 6-10 in outside 12 in walkway width, or uses an assistive device.    Gait with Vertical Head Turns Performs task with slight change in gait velocity (eg, minor disruption to smooth gait path), deviates 6 - 10 in outside 12 in walkway width or uses assistive device    Gait and  Pivot Turn Pivot turns safely within 3 sec and stops quickly with no loss of balance.    Step Over Obstacle Is able to step over one shoe box (4.5 in total height) without changing gait speed. No evidence of imbalance.    Gait with Narrow Base of Support Is able to ambulate for 10 steps heel to toe with no staggering.    Gait with Eyes Closed Walks 20 ft, uses assistive device, slower speed, mild gait deviations, deviates 6-10 in outside 12 in walkway width. Ambulates 20 ft in less than 9 sec but greater than 7 sec.    Ambulating Backwards Walks 20 ft, uses assistive device, slower speed, mild gait deviations, deviates 6-10 in outside 12 in walkway width.    Steps Alternating feet, must use rail.    Total Score 24            5xSTS: 13.63 s - no UE  : 12.78 s - no AD - 0.78 m/s TUG: 13.25 - no UE, no AD  FGA: 24/30  Gait: 115' - 34.4 s - no AD  MCTSIB Condition 1 - 30 s - no sway Condition 2 - 15.22 s - opened eyes, mild sway Condition 3 - 30 s - mild sway  Condition 4 - 30 s - mod sway, SBA    TODAY'S TREATMENT:  EVAL   PATIENT EDUCATION: Education details: PT exam findings, POC length  Person educated: Patient Education method: Explanation Education comprehension: verbalized understanding  HOME EXERCISE PROGRAM: To be provided   GOALS: Goals reviewed with patient? Yes  SHORT TERM GOALS: Target date: same as LTG     LONG TERM GOALS: Target date: 10/19/23  Patient will be independent with final HEP for improved balance and decreased fall risk.   Baseline: to be provided  Goal status: INITIAL  2.  Patient will demonstrate mild to no sway with Condition 4 of MCTSIB to demonstrate improved balance.  Baseline: mod sway and SBA Goal status: INITIAL  3.  Patient will improve gait speed to 1.3 m/s to increase safety with community  ambulation distances.  Baseline: 0.78 m/s Goal status: INITIAL  4.  6  MWT goal to be added Baseline: to be assessed  Goal status: INITIAL    ASSESSMENT:  CLINICAL IMPRESSION: Patient is a 42 y.o. female who was seen today for physical therapy evaluation and treatment for gait and balance. Patient has RRMS and has had 20-30 falls in the past 6 months. She was unable to identify any patterns as to when or why they happen. She was wearing flip flops and stated she walks best in those types of shoes. Gait pattern showed no arm swing and R trunk lean. PT exam showed bilat LE weakness and decreased balance, specifically of the vestibular system due to increased swaying when taking away vision and proprioception. Patient would benefit from skilled physical therapy to decrease her fall risk, improve her balance and safety awareness, and bilat LE strength and function to improve quality of life and independence.   OBJECTIVE IMPAIRMENTS: Abnormal gait, decreased balance, difficulty walking, decreased strength, and decreased safety awareness.   ACTIVITY LIMITATIONS: carrying, lifting, bending, and stairs  PARTICIPATION LIMITATIONS: driving and community activity  PERSONAL FACTORS: Age, Sex, Time since onset of injury/illness/exacerbation, and 1 comorbidity: RRMS  are also affecting patient's functional outcome.   REHAB POTENTIAL: Good  CLINICAL DECISION MAKING: Stable/uncomplicated  EVALUATION COMPLEXITY: Low  PLAN:  PT FREQUENCY: 2x/week  PT DURATION: 4 weeks  PLANNED INTERVENTIONS: 97164- PT Re-evaluation, 97110-Therapeutic exercises, 97530- Therapeutic activity, 97112- Neuromuscular re-education, 97535- Self Care, 96295- Manual therapy, [phone]- Gait training, [phone]- Orthotic Fit/training, [phone]- Canalith repositioning, Patient/Family education, Balance training, Stair training, Vestibular training, Visual/preceptual remediation/compensation, and DME instructions  PLAN FOR NEXT SESSION:  create HEP, 6 MWT    Virginia [name], SPT Virginia [name], PT, DPT, CBIS  09/21/2023, 7:53 AM

## 2023-09-21 ENCOUNTER — Other Ambulatory Visit: Payer: Self-pay | Admitting: Family Medicine

## 2023-09-21 DIAGNOSIS — I10 Essential (primary) hypertension: Secondary | ICD-10-CM

## 2023-09-21 NOTE — Addendum Note (Signed)
Addended by: Merry Lofty A on: 09/21/2023 08:02 AM   Modules accepted: Orders

## 2023-09-24 NOTE — Telephone Encounter (Signed)
Please see pt comment regarding needing an appt. Please advise pt.

## 2023-09-24 NOTE — Telephone Encounter (Signed)
Requested medication (s) are due for refill today: yes  Requested medication (s) are on the active medication list: yes  Last refill:  06/19/23 #90   Future visit scheduled: no  Notes to clinic:  Overdue lab work and OV   Requested Prescriptions  Pending Prescriptions Disp Refills   atenolol (TENORMIN) 50 MG tablet [Pharmacy Med Name: ATENOLOL 50 MG TABLET] 90 tablet 0    Sig: TAKE 1 TABLET BY MOUTH EVERY DAY     Cardiovascular: Beta Blockers 2 Failed - 09/21/2023  2:33 AM      Failed - Cr in normal range and within 360 days    Creat  Date Value Ref Range Status  10/03/2021 0.91 0.50 - 0.99 mg/dL Final   Creatinine, Ser  Date Value Ref Range Status  07/25/2022 0.89 0.57 - 1.00 mg/dL Final   Creatinine, Urine  Date Value Ref Range Status  07/26/2021 43 20 - 275 mg/dL Final         Failed - Valid encounter within last 6 months    Recent Outpatient Visits           1 year ago Pelvic pain   [facility] [name], MD   2 years ago Viral upper respiratory tract infection   [facility] [name], MD   2 years ago Elevated serum creatinine   [facility] Tanya Nones, Priscille Heidelberg, MD   2 years ago Cervical radiculopathy   [facility] [name], MD   2 years ago Diarrhea, unspecified type   [facility] Cathlean Marseilles A, NP              Passed - Last BP in normal range    BP Readings from Last 1 Encounters:  09/11/23 116/81         Passed - Last Heart Rate in normal range    Pulse Readings from Last 1 Encounters:  09/11/23 79

## 2023-09-25 ENCOUNTER — Ambulatory Visit: Payer: BC Managed Care – PPO | Admitting: Physical Therapy

## 2023-09-25 VITALS — BP 115/83 | HR 76

## 2023-09-25 DIAGNOSIS — R2681 Unsteadiness on feet: Secondary | ICD-10-CM | POA: Diagnosis not present

## 2023-09-25 DIAGNOSIS — R279 Unspecified lack of coordination: Secondary | ICD-10-CM

## 2023-09-25 DIAGNOSIS — M6281 Muscle weakness (generalized): Secondary | ICD-10-CM

## 2023-09-25 DIAGNOSIS — G35 Multiple sclerosis: Secondary | ICD-10-CM | POA: Diagnosis not present

## 2023-09-25 DIAGNOSIS — W19XXXA Unspecified fall, initial encounter: Secondary | ICD-10-CM | POA: Diagnosis not present

## 2023-09-25 NOTE — Therapy (Signed)
OUTPATIENT PHYSICAL THERAPY NEURO TREATMENT   Patient Name: Virginia [name] MRN: [id number] DOB:October 21, 1981, 42 y.o., female Today's Date: 09/25/2023   PCP: [name], MD  REFERRING PROVIDER: Shawnie Dapper, NP  END OF SESSION:  PT End of Session - 09/25/23 1537     Visit Number 2    Number of Visits 9    Date for PT Re-Evaluation 10/19/23    Authorization Type Blue Cross Blue Shield    PT Start Time 1335    PT Stop Time 1413    PT Time Calculation (min) 38 min    Equipment Utilized During Treatment Gait belt    Activity Tolerance Patient tolerated treatment well    Behavior During Therapy WFL for tasks assessed/performed             Past Medical History:  Diagnosis Date   Arthritis    Chronic migraine w/o aura w/o status migrainosus, not intractable    followed by [name]   GAD (generalized anxiety disorder)    GERD (gastroesophageal reflux disease)    Hemorrhoids    History of cervical dysplasia    followed by GYN--- dr Henderson [place];    hx CIN 2  s/p  colposcopy in office 05/ 2022 and CIN 3  s/p LEEP 11/ 2022   History of syncope 02/2016   admission in epic;  in setting of vertigo and dx w/ MS   Insomnia    Multiple sclerosis, relapsing-remitting (HCC) 02/2016   neurologist--- [name];  dx 05/ 2017 lumbar puncture,  dyesthesia, fatigue, vertigo   PAC (premature atrial contraction)    PSVT (paroxysmal supraventricular tachycardia) (HCC) 2019   (02-01-2023 pt stated followed by pcp) // cardiologist--- dr g. taylor;  (lov in epic 06-21-2018)  evaluated 2019 after dx MS in 2017 with syncope/ palpitations;   event monitor showed PSVT,  PACs/ PVCs   PVC (premature ventricular contraction)    Sleep apnea, unspecified    study in epic 08-07-2018  borderline sleep apnea,  recommendation loss wt, sleep hygiene, mouth guard   Past Surgical History:  Procedure Laterality Date   COLONOSCOPY WITH ESOPHAGOGASTRODUODENOSCOPY (EGD)  11/15/2022   [name];   EVALUATION UNDER  ANESTHESIA WITH HEMORRHOIDECTOMY N/A 02/08/2023   Procedure: ANORECTAL EXAM UNDER ANESTHESIA;  Surgeon: [name], MD;  Location: [facility];  Service: General;  Laterality: N/A;   HEMORRHOID SURGERY  02/08/2023   Procedure: EXTERNAL HEMORRHOIDECTOMY X2;  Surgeon: [name], MD;  Location: [facility]  Service: General;;   LEEP N/A 09/20/2021   Procedure: LOOP ELECTROSURGICAL EXCISION PROCEDURE (LEEP);  Surgeon: [name], MD;  Location: [facility];  Service: Gynecology;  Laterality: N/A;   Patient Active Problem List   Diagnosis Date Noted   Body mass index (BMI) 34.0-34.9, adult 09/05/2023   Leiomyoma 08/14/2023   Family history of breast cancer 07/25/2022   Chronic migraine w/o aura w/o status migrainosus, not intractable 09/01/2021   Dysplasia of cervix, high grade CIN 2 03/18/2021   LGSIL on Pap smear of cervix 02/28/2021   Vertigo of central origin 06/24/2020   Numbness 06/24/2020   Panic disorder 11/11/2019   Current mild episode of major depressive disorder without prior episode (HCC) 11/11/2019   Snoring 06/20/2018   Excessive daytime sleepiness 06/20/2018   Neck pain 01/01/2018   Palpitations 01/01/2018   Myalgia 09/03/2017   Chronic low back pain without sciatica 09/03/2017   Right leg pain 07/26/2017   Bilateral sciatica 07/12/2017  Nystagmus 02/15/2017   Spell of altered consciousness 07/19/2016   Ataxic gait 05/31/2016   Other fatigue 05/31/2016   Dysesthesia 05/31/2016   High risk medication use 03/31/2016   Insomnia 03/31/2016   Dizziness    Syncope    Multiple sclerosis (HCC) 03/21/2016   Relapsing remitting multiple sclerosis (HCC)    Vertigo    White matter abnormality on MRI of brain 03/20/2016   Faintness     ONSET DATE: Referral  09/11/23  REFERRING DIAG: Diagnosis G35 (ICD-10-CM) - Relapsing remitting multiple sclerosis (HCC) W19.Lorne Skeens (ICD-10-CM) - Fall, initial encounter  THERAPY DIAG:   Unsteadiness on feet  Muscle weakness (generalized)  Unspecified lack of coordination  Rationale for Evaluation and Treatment: Rehabilitation  SUBJECTIVE:                                                                                                                                                                                             SUBJECTIVE STATEMENT: Patient reports that her legs feel shaky and weak on long step down tasks particularly with a hike. She does not have a cooling vest. Denies falls and near falls since last here.    Pt accompanied by: self  PERTINENT HISTORY: RRMS, anxiety, arthritis  PAIN:  Are you having pain?  Dropped something on R foot earlier, back pain all the time  PRECAUTIONS: Fall  RED FLAGS: None   WEIGHT BEARING RESTRICTIONS: No  FALLS: Has patient fallen in last 6 months? Yes. Number of falls 20-30   LIVING ENVIRONMENT: Lives with: lives with their spouse Lives in: House/apartment Stairs: Yes: External: 4-5 steps; none Has following equipment at home: Crutches and Grab bars  PLOF: Independent  PATIENT GOALS: "strengthening up my legs"   OBJECTIVE:  Note: Objective measures were completed at Evaluation unless otherwise noted.  DIAGNOSTIC FINDINGS: Brain MRI 04/09/22 IMPRESSION: This MRI of the brain without contrast shows the following: 1.   T2/FLAIR hyperintense foci in the cerebral hemispheres and the left medulla in a pattern consistent with chronic demyelinating plaque associated with multiple sclerosis.  None of the foci appear to be acute.  Compared to the MRI dated 01/20/2020, there are no new lesions. 2.   No acute findings.   COGNITION: Overall cognitive status: Within functional limits for tasks assessed   TODAY'S TREATMENT:  Vitals:   09/25/23 1545 09/25/23 1558  BP: 114/72 115/83  Pulse: 79  76   Seated on LUE   TherAct:  Assessed vitals as noted above; seated on LUE Pre : 114/72 mmHg, 79 bpm Post : 115/83 mmHg, 76 bpm  Assessed as not captured on eval  [facility] PT Assessment - 09/25/23 0001       6 minute walk test results    Aerobic Endurance Distance Walked 756   feet without AD (SBA)   Endurance additional comments RPE: 2/10              [facility] PT Assessment - 09/25/23 0001       6 minute walk test results    Aerobic Endurance Distance Walked 756   feet without AD (SBA)   Endurance additional comments RPE: 2/10             Discussed cooling vest that may be available with application to MS association as well as cooling vests available online; discussed activity modification and rest breaks; patient reports preferring flip flops as they are lighter than other shoes; patient is sneakers today but are quite light, discussed safety with sneakers   TherEx:   Exercises for HEP - Sit to Stand Without Arm Support - 2-3 sets - 5-8 reps (with mix of no and use of UE to stand, eccentric lower without UE use) - Seated Heel Toe Raises  - 10 reps - Bird Dog on Counter  - 10 reps  PATIENT EDUCATION: Education details: Initial HEP + results + cooling vest Person educated: Patient Education method: Explanation Education comprehension: verbalized understanding  HOME EXERCISE PROGRAM: Access Code: [email or web address] URL: [email or web address] Date: 09/25/2023 Prepared by: Maryruth Eve  Exercises - Sit to Stand Without Arm Support  - 1 x daily - 7 x weekly - 2-3 sets - 5-8 reps - Seated Heel Toe Raises  - 1 x daily - 7 x weekly - 3 sets - 10 reps - Bird Dog on Counter  - 1 x daily - 7 x weekly - 3 sets - 5-10 reps  GOALS: Goals reviewed with patient? Yes  SHORT TERM GOALS: Target date: same as LTG     LONG TERM GOALS: Target date: 10/19/23  Patient will be independent with final HEP for improved balance and decreased fall risk.    Baseline: to be provided  Goal status: INITIAL  2.  Patient will demonstrate mild to no sway with Condition 4 of MCTSIB to demonstrate improved balance.  Baseline: mod sway and SBA Goal status: INITIAL  3.  Patient will improve gait speed to 1.3 m/s to increase safety with community ambulation distances.  Baseline: 0.78 m/s Goal status: INITIAL  4.  Patient will improve to 850 feet to demonstrate improved aerobic capacity and endurance in order to participate more easily in daily walking tasks.   Baseline: 756 feet without AD Goal status: INITIAL    ASSESSMENT:  CLINICAL IMPRESSION: Patient seen for skilled physical therapy session with emphasis on education of MS symptom management, assessment of , and establishment of HEP. Patient pace during slow but appears to be cautious for safety reasons and energy conservation. Patient able to have intermixed success with stands without UE support. Continue POC.   OBJECTIVE IMPAIRMENTS: Abnormal gait, decreased balance, difficulty walking, decreased strength, and decreased safety awareness.   ACTIVITY LIMITATIONS: carrying, lifting, bending, and stairs  PARTICIPATION LIMITATIONS: driving and community activity  PERSONAL FACTORS: Age, Sex, Time since onset  of injury/illness/exacerbation, and 1 comorbidity: RRMS  are also affecting patient's functional outcome.   REHAB POTENTIAL: Good  CLINICAL DECISION MAKING: Stable/uncomplicated  EVALUATION COMPLEXITY: Low  PLAN:  PT FREQUENCY: 2x/week  PT DURATION: 4 weeks  PLANNED INTERVENTIONS: 97164- PT Re-evaluation, 97110-Therapeutic exercises, 97530- Therapeutic activity, 97112- Neuromuscular re-education, 97535- Self Care, 44010- Manual therapy, [phone]- Gait training, [phone]- Orthotic Fit/training, [phone]- Canalith repositioning, Patient/Family education, Balance training, Stair training, Vestibular training, Visual/preceptual remediation/compensation, and DME  instructions  PLAN FOR NEXT SESSION: work on eccentric lower tasks, work on step overs or modified SLS tasks, higher level balance, functional strength with dead lift/hip hinge + add to HEP, did patient look into getting cooling vest for warmer weather    Maryruth Eve, PT, DPT  09/25/2023, 5:25 PM

## 2023-09-26 ENCOUNTER — Ambulatory Visit: Payer: BC Managed Care – PPO

## 2023-10-01 ENCOUNTER — Ambulatory Visit: Payer: BC Managed Care – PPO | Admitting: Family Medicine

## 2023-10-01 VITALS — BP 122/76 | HR 83 | Temp 98.1°F | Ht 64.0 in | Wt 199.2 lb

## 2023-10-01 DIAGNOSIS — D751 Secondary polycythemia: Secondary | ICD-10-CM | POA: Diagnosis not present

## 2023-10-01 NOTE — Progress Notes (Signed)
Subjective:    Patient ID: Virginia [name], female    DOB: 1980/12/14, 42 y.o.   MRN: [id number] Patient was referred to discuss her lab results.  I reviewed her CBC and her immunoglobulin levels from November 12.  IgM was slightly low at 25.  The remaining immunoglobulin levels were normal.  Hemoglobin was elevated at 16.2.  MCV was slightly elevated at 100.  RDW was slightly low at 11.4.  Patient does vape.  She also has mild sleep apnea. Past Medical History:  Diagnosis Date  . Arthritis   . Chronic migraine w/o aura w/o status migrainosus, not intractable    followed by [name]  . GAD (generalized anxiety disorder)   . GERD (gastroesophageal reflux disease)   . Hemorrhoids   . History of cervical dysplasia    followed by GYN--- [name]. harris;    hx CIN 2  s/p  colposcopy in office 05/ 2022 and CIN 3  s/p LEEP 11/ 2022  . History of syncope 02/2016   admission in epic;  in setting of vertigo and dx w/ MS  . Insomnia   . Multiple sclerosis, relapsing-remitting (HCC) 02/2016   neurologist--- [name];  dx 05/ 2017 lumbar puncture,  dyesthesia, fatigue, vertigo  . PAC (premature atrial contraction)   . PSVT (paroxysmal supraventricular tachycardia) (HCC) 2019   (02-01-2023 pt stated followed by pcp) // cardiologist--- dr g. taylor;  (lov in epic 06-21-2018)  evaluated 2019 after dx MS in 2017 with syncope/ palpitations;   event monitor showed PSVT,  PACs/ PVCs  . PVC (premature ventricular contraction)   . Sleep apnea, unspecified    study in epic 08-07-2018  borderline sleep apnea,  recommendation loss wt, sleep hygiene, mouth guard   Past Surgical History:  Procedure Laterality Date  . COLONOSCOPY WITH ESOPHAGOGASTRODUODENOSCOPY (EGD)  11/15/2022   [name];  . EVALUATION UNDER ANESTHESIA WITH HEMORRHOIDECTOMY N/A 02/08/2023   Procedure: ANORECTAL EXAM UNDER ANESTHESIA;  Surgeon: [name], MD;  Location: [facility] [place];  Service: General;  Laterality: N/A;  .  HEMORRHOID SURGERY  02/08/2023   Procedure: EXTERNAL HEMORRHOIDECTOMY X2;  Surgeon: [name], MD;  Location: [facility];  Service: General;;  . LEEP N/A 09/20/2021   Procedure: LOOP ELECTROSURGICAL EXCISION PROCEDURE (LEEP);  Surgeon: [name], MD;  Location: [facility];  Service: Gynecology;  Laterality: N/A;   Current Outpatient Medications on File Prior to Visit  Medication Sig Dispense Refill  . amitriptyline (ELAVIL) 50 MG tablet Take 50 mg by mouth at bedtime as needed.    Marland Kitchen atenolol (TENORMIN) 50 MG tablet Take 1 tablet (50 mg total) by mouth daily. 90 tablet 0  . baclofen (LIORESAL) 10 MG tablet TAKE 2 TABLETS BY MOUTH 3 TIMES A DAY FOR SPASTICITY 540 tablet 0  . clonazePAM (KLONOPIN) 1 MG tablet Take 1 tablet (1 mg total) by mouth 2 (two) times daily as needed for anxiety. 30 tablet 3  . drospirenone-ethinyl estradiol (YAZ) 3-0.02 MG tablet Take 1 tablet by mouth daily. 84 tablet 0  . DULoxetine (CYMBALTA) 60 MG capsule TAKE 1 CAPSULE BY MOUTH EVERY DAY 90 capsule 0  . etodolac (LODINE) 400 MG tablet Take 1 tablet (400 mg total) by mouth 2 (two) times daily. 180 tablet 1  . gabapentin (NEURONTIN) 600 MG tablet Take 1 tablet (600 mg total) by mouth 4 (four) times daily. 360 tablet 1  . KESIMPTA 20 MG/0.4ML SOAJ Inject 0.4 mLs into the skin  every 30 (thirty) days. 1.2 mL 2  . meclizine (ANTIVERT) 25 MG tablet Take 1 tablet (25 mg total) by mouth 3 (three) times daily as needed for dizziness. 30 tablet 11  . pantoprazole (PROTONIX) 40 MG tablet Take 1 tablet (40 mg total) by mouth 2 (two) times daily. 180 tablet 0  . Semaglutide-Weight Management (WEGOVY) 0.5 MG/0.5ML SOAJ Inject 0.5 mg into the skin once a week. 2 mL 1  . SUMAtriptan (IMITREX) 50 MG tablet Take 1 tablet (50 mg total) by mouth daily as needed for migraine. May repeat in 2 hours if headache persists or recurs. 10 tablet 11   No current facility-administered medications on file prior to  visit.   Allergies  Allergen Reactions  . Codeine Other (See Comments)    Hallucinations   . Onion Swelling  . Topamax [Topiramate]     Was talking out of her head, walked into walls   Social History   Socioeconomic History  . Marital status: Married    Spouse name: Not on file  . Number of children: Not on file  . Years of education: Not on file  . Highest education level: 12th grade  Occupational History  . Not on file  Tobacco Use  . Smoking status: Former    Current packs/day: 0.00    Average packs/day: 0.5 packs/day for 10.0 years (5.0 ttl pk-yrs)    Types: Cigarettes    Start date: 2005    Quit date: 2015    Years since quitting: 9.9  . Smokeless tobacco: Never  Vaping Use  . Vaping status: Every Day  . Substances: Nicotine, Flavoring, Nicotine-salt  . Devices: unsure name  Substance and Sexual Activity  . Alcohol use: Yes    Comment: rarely  . Drug use: Never  . Sexual activity: Not Currently    Birth control/protection: Pill  Other Topics Concern  . Not on file  Social History Narrative  . Not on file   Social Determinants of Health   Financial Resource Strain: Medium Risk (08/17/2023)   Overall Financial Resource Strain (CARDIA)   . Difficulty of Paying Living Expenses: Somewhat hard  Food Insecurity: Food Insecurity Present (08/17/2023)   Hunger Vital Sign   . Worried About Programme researcher, broadcasting/film/video in the Last Year: Sometimes true   . Ran Out of Food in the Last Year: Often true  Transportation Needs: No Transportation Needs (08/17/2023)   PRAPARE - Transportation   . Lack of Transportation (Medical): No   . Lack of Transportation (Non-Medical): No  Physical Activity: Insufficiently Active (08/17/2023)   Exercise Vital Sign   . Days of Exercise per Week: 3 days   . Minutes of Exercise per Session: 10 min  Stress: Stress Concern Present (08/17/2023)   [employer] of Occupational Health - Occupational Stress Questionnaire   . Feeling of Stress  : Very much  Social Connections: Socially Isolated (08/17/2023)   Social Connection and Isolation Panel [NHANES]   . Frequency of Communication with Friends and Family: More than three times a week   . Frequency of Social Gatherings with Friends and Family: Never   . Attends Religious Services: Never   . Active Member of Clubs or Organizations: No   . Attends Banker Meetings: Not on file   . Marital Status: Separated  Intimate Partner Violence: Not on file      Review of Systems  All other systems reviewed and are negative.      Objective:  Physical Exam Constitutional:      Appearance: Normal appearance. She is obese.  Cardiovascular:     Rate and Rhythm: Normal rate and regular rhythm.     Heart sounds: Normal heart sounds. No murmur heard. Pulmonary:     Effort: Pulmonary effort is normal. No respiratory distress.     Breath sounds: Normal breath sounds. No stridor. No wheezing, rhonchi or rales.  Musculoskeletal:     Right shoulder: Tenderness present. No swelling, deformity or effusion. Decreased range of motion. Normal strength.  Neurological:     General: No focal deficit present.     Mental Status: She is alert and oriented to person, place, and time. Mental status is at baseline.     Cranial Nerves: No cranial nerve deficit.        Assessment & Plan:  Polycythemia I believe the polycythemia is secondary to her sleep apnea and also potential lung damage from vaping.  I do not believe that this shows evidence of polycythemia vera.  Therefore I do not feel that she needs hematology consult at the present time.  Will simply monitor this MCV levels can certainly be elevated and B12 deficiency folic acid deficiency and thyroid abnormalities.  At the present time we will simply monitor this and not perform any additional workup.  She does have some pain in her right shoulder.  This does not appear to be neuropathic in nature.  I do not believe that this is  cervical radiculopathy.  Pain is worse with Hawkins maneuver.  She has pain with abduction greater than 120 degrees.  She has pain with passive range of motion.  I believe that this is more likely due to subacromial bursitis.  We discussed a cortisone injection but at the present time she states the pain is manageable and she declines a cortisone injection

## 2023-10-02 ENCOUNTER — Ambulatory Visit: Payer: BC Managed Care – PPO | Attending: Family Medicine | Admitting: Physical Therapy

## 2023-10-02 ENCOUNTER — Encounter: Payer: Self-pay | Admitting: Physical Therapy

## 2023-10-02 ENCOUNTER — Encounter: Payer: Self-pay | Admitting: Family Medicine

## 2023-10-02 VITALS — BP 102/72 | HR 78

## 2023-10-02 DIAGNOSIS — R279 Unspecified lack of coordination: Secondary | ICD-10-CM | POA: Insufficient documentation

## 2023-10-02 DIAGNOSIS — R2681 Unsteadiness on feet: Secondary | ICD-10-CM | POA: Insufficient documentation

## 2023-10-02 DIAGNOSIS — M6281 Muscle weakness (generalized): Secondary | ICD-10-CM | POA: Diagnosis not present

## 2023-10-02 NOTE — Therapy (Signed)
OUTPATIENT PHYSICAL THERAPY NEURO TREATMENT   Patient Name: Virginia [name] MRN: [id number] DOB:[birth year], 42 y.o., female Today's Date: 10/02/2023   PCP: [name], MD  REFERRING PROVIDER: Shawnie Dapper, NP  END OF SESSION:  PT End of Session - 10/02/23 1535     Visit Number 3    Number of Visits 9    Date for PT Re-Evaluation 10/19/23    Authorization Type Blue Cross Blue Shield    PT Start Time 1535    PT Stop Time 1615    PT Time Calculation (min) 40 min    Equipment Utilized During Treatment Gait belt    Activity Tolerance Patient tolerated treatment well    Behavior During Therapy WFL for tasks assessed/performed             Past Medical History:  Diagnosis Date   Arthritis    Chronic migraine w/o aura w/o status migrainosus, not intractable    followed by [name]   GAD (generalized anxiety disorder)    GERD (gastroesophageal reflux disease)    Hemorrhoids    History of cervical dysplasia    followed by GYN--- dr Henderson [place];    hx CIN 2  s/p  colposcopy in office 05/ 2022 and CIN 3  s/p LEEP 11/ 2022   History of syncope 02/2016   admission in epic;  in setting of vertigo and dx w/ MS   Insomnia    Multiple sclerosis, relapsing-remitting (HCC) 02/2016   neurologist--- [name];  dx 05/ 2017 lumbar puncture,  dyesthesia, fatigue, vertigo   PAC (premature atrial contraction)    PSVT (paroxysmal supraventricular tachycardia) (HCC) 2019   (02-01-2023 pt stated followed by pcp) // cardiologist--- dr g. taylor;  (lov in epic 06-21-2018)  evaluated 2019 after dx MS in 2017 with syncope/ palpitations;   event monitor showed PSVT,  PACs/ PVCs   PVC (premature ventricular contraction)    Sleep apnea, unspecified    study in epic 08-07-2018  borderline sleep apnea,  recommendation loss wt, sleep hygiene, mouth guard   Past Surgical History:  Procedure Laterality Date   COLONOSCOPY WITH ESOPHAGOGASTRODUODENOSCOPY (EGD)  11/15/2022   [name];   EVALUATION UNDER  ANESTHESIA WITH HEMORRHOIDECTOMY N/A 02/08/2023   Procedure: ANORECTAL EXAM UNDER ANESTHESIA;  Surgeon: [name], MD;  Location: [facility];  Service: General;  Laterality: N/A;   HEMORRHOID SURGERY  02/08/2023   Procedure: EXTERNAL HEMORRHOIDECTOMY X2;  Surgeon: [name], MD;  Location: [facility];  Service: General;;   LEEP N/A 09/20/2021   Procedure: LOOP ELECTROSURGICAL EXCISION PROCEDURE (LEEP);  Surgeon: [name], MD;  Location: [facility];  Service: Gynecology;  Laterality: N/A;   Patient Active Problem List   Diagnosis Date Noted   Body mass index (BMI) 34.0-34.9, adult 09/05/2023   Leiomyoma 08/14/2023   Family history of breast cancer 07/25/2022   Chronic migraine w/o aura w/o status migrainosus, not intractable 09/01/2021   Dysplasia of cervix, high grade CIN 2 03/18/2021   LGSIL on Pap smear of cervix 02/28/2021   Vertigo of central origin 06/24/2020   Numbness 06/24/2020   Panic disorder 11/11/2019   Current mild episode of major depressive disorder without prior episode (HCC) 11/11/2019   Snoring 06/20/2018   Excessive daytime sleepiness 06/20/2018   Neck pain 01/01/2018   Palpitations 01/01/2018   Myalgia 09/03/2017   Chronic low back pain without sciatica 09/03/2017   Right leg pain 07/26/2017   Bilateral sciatica 07/12/2017  Nystagmus 02/15/2017   Spell of altered consciousness 07/19/2016   Ataxic gait 05/31/2016   Other fatigue 05/31/2016   Dysesthesia 05/31/2016   High risk medication use 03/31/2016   Insomnia 03/31/2016   Dizziness    Syncope    Multiple sclerosis (HCC) 03/21/2016   Relapsing remitting multiple sclerosis (HCC)    Vertigo    White matter abnormality on MRI of brain 03/20/2016   Faintness     ONSET DATE: Referral  09/11/23  REFERRING DIAG: Diagnosis G35 (ICD-10-CM) - Relapsing remitting multiple sclerosis (HCC) W19.Lorne Skeens (ICD-10-CM) - Fall, initial encounter  THERAPY DIAG:   Unsteadiness on feet  Muscle weakness (generalized)  Unspecified lack of coordination  Rationale for Evaluation and Treatment: Rehabilitation  SUBJECTIVE:                                                                                                                                                                                             SUBJECTIVE STATEMENT: Patient reports that she forgot to check about the cooing vest and would like a reminder. Reports exercises are going well at home. Denies falls and near falls since last here. Patient reporting that she is not feeling great today largely due to a very poor night of sleep. Reports having a headache and overall feeling fatigued today.   Pt accompanied by: self  PERTINENT HISTORY: RRMS, anxiety, arthritis  PAIN:  Are you having pain?  Dropped something on R foot earlier, back pain all the time  PRECAUTIONS: Fall  RED FLAGS: None   WEIGHT BEARING RESTRICTIONS: No  FALLS: Has patient fallen in last 6 months? Yes. Number of falls 20-30   LIVING ENVIRONMENT: Lives with: lives with their spouse Lives in: House/apartment Stairs: Yes: External: 4-5 steps; none Has following equipment at home: Crutches and Grab bars  PLOF: Independent  PATIENT GOALS: "strengthening up my legs"   OBJECTIVE:  Note: Objective measures were completed at Evaluation unless otherwise noted.  DIAGNOSTIC FINDINGS: Brain MRI 04/09/22 IMPRESSION: This MRI of the brain without contrast shows the following: 1.   T2/FLAIR hyperintense foci in the cerebral hemispheres and the left medulla in a pattern consistent with chronic demyelinating plaque associated with multiple sclerosis.  None of the foci appear to be acute.  Compared to the MRI dated 01/20/2020, there are no new lesions. 2.   No acute findings.   COGNITION: Overall cognitive status: Within functional limits for tasks assessed   TODAY'S TREATMENT:  Vitals:   10/02/23 1556 10/02/23 1610  BP: 107/74 102/72  Pulse: 81 78   Seated on RUE  TherAct:  Vitals:   10/02/23 1540 10/02/23 1542 10/02/23 1556 10/02/23 1610  BP: 98/69 92/61 107/74 102/72  Pulse: 80 87 81 78    Vitals notably lower than prior PT session and patient report of BP around 120/70s yesterday at doctor's visit. Third reading consistent with orthostatic hypotension when compared to first reading as performed in standing whereas first 2 readings performed in sitting. Patient denies dizziness but does report feeling lethargic though could also be related with very poor night of sleep. After SciFit felt safe to continue session with BP monitoring as needed. Recommended    Performed SciFit on base setting on level 3 x 5 min to assess for cardiovascular response to exercise with safe seated activity, performed with reciprocal UE/LE movements   - RPE: 2/10   - Post SciFit BP: 107/74 mmHg   TherEx: Sit to stand without UE for both stand and sit 2 x 10 Side Stepping with Resistance at Ankles and Counter Support  - 3 sets x 10 feet Forward and Backward Monster Walk with Resistance at Ankles and Counter Support - 3 sets - 10 feet  PATIENT EDUCATION: Education details: Initial HEP + results + cooling vest Person educated: Patient Education method: Explanation Education comprehension: verbalized understanding  HOME EXERCISE PROGRAM: Access Code: [email or web address] URL: [email or web address] Date: 09/25/2023 Prepared by: Maryruth Eve  Exercises - Sit to Stand Without Arm Support  - 1 x daily - 7 x weekly - 2-3 sets - 5-8 reps - Seated Heel Toe Raises  - 1 x daily - 7 x weekly - 3 sets - 10 reps - Bird Dog on Counter  - 1 x daily - 7 x weekly - 3 sets - 5-10 reps - Side Stepping with Resistance at Ankles and Counter Support  - 1 x daily - 4 x weekly - 3 sets - Forward and Backward  Monster Walk with Resistance at Ankles and Counter Support  - 1 x daily - 4 x weekly - 3 sets - 10 reps  GOALS: Goals reviewed with patient? Yes  SHORT TERM GOALS: Target date: same as LTG     LONG TERM GOALS: Target date: 10/19/23  Patient will be independent with final HEP for improved balance and decreased fall risk.   Baseline: to be provided  Goal status: INITIAL  2.  Patient will demonstrate mild to no sway with Condition 4 of MCTSIB to demonstrate improved balance.  Baseline: mod sway and SBA Goal status: INITIAL  3.  Patient will improve gait speed to 1.3 m/s to increase safety with community ambulation distances.  Baseline: 0.78 m/s Goal status: INITIAL  4.  Patient will improve to 850 feet to demonstrate improved aerobic capacity and endurance in order to participate more easily in daily walking tasks.   Baseline: 756 feet without AD Goal status: INITIAL    ASSESSMENT:  CLINICAL IMPRESSION: Patient seen for skilled physical therapy session with emphasis on close BP monitoring as patient BP lower than normal in today's session; patient presents with drop consistent with orthostatic hypotension from sitting to standing. Recommend ongoing close monitoring and follow up with PCP. Patient asymptomatic throughout session and reported improvement in fatigue from 10/10 to 6/10 by end of session. Discussed safety with HEP and not to perform if feeling lightheaded. Continue POC.   OBJECTIVE IMPAIRMENTS: Abnormal gait, decreased balance, difficulty walking, decreased strength,  and decreased safety awareness.   ACTIVITY LIMITATIONS: carrying, lifting, bending, and stairs  PARTICIPATION LIMITATIONS: driving and community activity  PERSONAL FACTORS: Age, Sex, Time since onset of injury/illness/exacerbation, and 1 comorbidity: RRMS  are also affecting patient's functional outcome.   REHAB POTENTIAL: Good  CLINICAL DECISION MAKING: Stable/uncomplicated  EVALUATION  COMPLEXITY: Low  PLAN:  PT FREQUENCY: 2x/week  PT DURATION: 4 weeks  PLANNED INTERVENTIONS: 97164- PT Re-evaluation, 97110-Therapeutic exercises, 97530- Therapeutic activity, 97112- Neuromuscular re-education, 97535- Self Care, 32440- Manual therapy, [phone]- Gait training, [phone]- Orthotic Fit/training, [phone]- Canalith repositioning, Patient/Family education, Balance training, Stair training, Vestibular training, Visual/preceptual remediation/compensation, and DME instructions  PLAN FOR NEXT SESSION: work on eccentric lower tasks, work on step overs or modified SLS tasks, higher level balance, functional strength with dead lift/hip hinge + add to HEP, did patient look into getting cooling vest for warmer weather, assess vitals as BP low last session   Maryruth Eve, PT, DPT  10/02/2023, 5:29 PM

## 2023-10-03 ENCOUNTER — Ambulatory Visit (HOSPITAL_BASED_OUTPATIENT_CLINIC_OR_DEPARTMENT_OTHER): Payer: BC Managed Care – PPO | Admitting: Family

## 2023-10-03 ENCOUNTER — Other Ambulatory Visit: Payer: Self-pay

## 2023-10-03 ENCOUNTER — Encounter (HOSPITAL_COMMUNITY): Payer: Self-pay | Admitting: Family

## 2023-10-03 VITALS — BP 116/77 | HR 76 | Ht 64.0 in | Wt 202.0 lb

## 2023-10-03 DIAGNOSIS — F320 Major depressive disorder, single episode, mild: Secondary | ICD-10-CM | POA: Diagnosis not present

## 2023-10-03 DIAGNOSIS — F411 Generalized anxiety disorder: Secondary | ICD-10-CM | POA: Diagnosis not present

## 2023-10-03 DIAGNOSIS — G479 Sleep disorder, unspecified: Secondary | ICD-10-CM | POA: Diagnosis not present

## 2023-10-03 DIAGNOSIS — I10 Essential (primary) hypertension: Secondary | ICD-10-CM

## 2023-10-03 MED ORDER — MIRTAZAPINE 7.5 MG PO TABS: 7.5000 mg | ORAL_TABLET | Freq: Every day | ORAL | 0 refills | Status: DC

## 2023-10-03 MED ORDER — ATENOLOL 50 MG PO TABS: 50.0000 mg | ORAL_TABLET | Freq: Every day | ORAL | 0 refills | Status: DC

## 2023-10-03 NOTE — Progress Notes (Cosign Needed Addendum)
Psychiatric Initial Adult Assessment   Patient Identification: Virginia [name] MRN:  [id number] Date of Evaluation:  10/03/2023 Referral Source: MD Broadus John Chief Complaint:  " I am not sleeping."   Visit Diagnosis:    ICD-10-CM   1. Current mild episode of major depressive disorder without prior episode (HCC)  F32.0     2. Generalized anxiety disorder  F41.1     3. Sleep disturbance  G47.9       History of Present Illness:  Virginia [name] is a 1 years Caucasian female presents to establish care.  She reports she was referred by her primary care Dr. [facility].  Chart reviewed patient has diagnosis related to depression and anxiety.  States she has been on antidepressants in the past however, is currently not prescribed any psychotropic medications at this time.  She reports she and her doctor have tried multiple medications for insomnia.Chart review failed medications with "trazodone, amitriptyline, Ambien and Klonopin. Charted that Restoril was not covered by patient's insurance."  Virginia [name] denied that she is currently followed by psychiatrist and/or therapist currently.  Denied previous inpatient admissions.  Denied history with self injures behaviors.  She reports mild depression symptoms to which she attributes to lack of sleep.  She reports mild anxiety/panic attacks at night which does not allow her to get up good nights rest.  She reports some mood lability and poor concentration however, she attributes that to her diagnosis related to sclerosis.  States she is currently disabled due to current medical diagnoses.  She denied concerns with auditory or visual hallucinations.  Sleep disturbance: Mild depression Generalized anxiety disorder:  Initiated Remeron 7.5 to 15 mg nightly  Virginia [name] reported she is married for the past 10 years.  States it has been supportive.  She reports she has 65 child 52 year old.  Unknown family mental health history however did report  that her father completed suicide in 26.   She denied illicit drug use or substance abuse history.  Denied history related to physical, sexual or emotional abuse.  States history of tobacco use however quit in 2015.  PHQ9 =8,  GAD =2  Virginia [name] is sitting; she is alert/oriented x 4; calm/cooperative; and mood congruent with affect.  Patient is speaking in a clear tone at moderate volume, and normal pace; with good eye contact. Her thought process is coherent and relevant; There is no indication that she is currently responding to internal/external stimuli or experiencing delusional thought content.  Patient denies suicidal/self-harm/homicidal ideation, psychosis, and paranoia.  Patient has remained calm throughout assessment and has answered questions appropriately.   Associated Signs/Symptoms: Depression Symptoms:  insomnia, difficulty concentrating, anxiety, disturbed sleep, (Hypo) Manic Symptoms:  Distractibility, Anxiety Symptoms:  Excessive Worry, Psychotic Symptoms:  Hallucinations: None PTSD Symptoms: NA  Past Psychiatric History:   Previous Psychotropic Medications: No   Substance Abuse History in the last 12 months:  No.  Consequences of Substance Abuse: NA  Past Medical History:  Past Medical History:  Diagnosis Date   Arthritis    Chronic migraine w/o aura w/o status migrainosus, not intractable    followed by [name]   GAD (generalized anxiety disorder)    GERD (gastroesophageal reflux disease)    Hemorrhoids    History of cervical dysplasia    followed by GYN--- [name]. harris;    hx CIN 2  s/p  colposcopy in office 05/ 2022 and CIN 3  s/p LEEP 11/ 2022   History of syncope 02/2016   admission  in epic;  in setting of vertigo and dx w/ MS   Insomnia    Multiple sclerosis, relapsing-remitting (HCC) 02/2016   neurologist--- [name];  dx 05/ 2017 lumbar puncture,  dyesthesia, fatigue, vertigo   PAC (premature atrial contraction)    PSVT (paroxysmal  supraventricular tachycardia) (HCC) 2019   (02-01-2023 pt stated followed by pcp) // cardiologist--- dr g. taylor;  (lov in epic 06-21-2018)  evaluated 2019 after dx MS in 2017 with syncope/ palpitations;   event monitor showed PSVT,  PACs/ PVCs   PVC (premature ventricular contraction)    Sleep apnea, unspecified    study in epic 08-07-2018  borderline sleep apnea,  recommendation loss wt, sleep hygiene, mouth guard    Past Surgical History:  Procedure Laterality Date   COLONOSCOPY WITH ESOPHAGOGASTRODUODENOSCOPY (EGD)  11/15/2022   [name];   EVALUATION UNDER ANESTHESIA WITH HEMORRHOIDECTOMY N/A 02/08/2023   Procedure: ANORECTAL EXAM UNDER ANESTHESIA;  Surgeon: [name], MD;  Location: [facility];  Service: General;  Laterality: N/A;   HEMORRHOID SURGERY  02/08/2023   Procedure: EXTERNAL HEMORRHOIDECTOMY X2;  Surgeon: [name], MD;  Location: [facility];  Service: General;;   LEEP N/A 09/20/2021   Procedure: LOOP ELECTROSURGICAL EXCISION PROCEDURE (LEEP);  Surgeon: [name], MD;  Location: [facility];  Service: Gynecology;  Laterality: N/A;    Family Psychiatric History:   Family History:  Family History  Problem Relation Age of Onset   Emphysema Mother    Osteoarthritis Mother    Anxiety disorder Mother    Uterine cancer Mother 12   Other Father    Suicidality Father    Breast cancer Maternal Aunt    Breast cancer Maternal Aunt    Breast cancer Maternal Aunt    ALS Maternal Grandmother    Colon cancer Neg Hx    Colon polyps Neg Hx    Esophageal cancer Neg Hx    Rectal cancer Neg Hx    Stomach cancer Neg Hx     Social History:   Social History   Socioeconomic History   Marital status: Married    Spouse name: Not on file   Number of children: Not on file   Years of education: Not on file   Highest education level: 12th grade  Occupational History   Not on file  Tobacco Use   Smoking status: Former     Current packs/day: 0.00    Average packs/day: 0.5 packs/day for 10.0 years (5.0 ttl pk-yrs)    Types: Cigarettes    Start date: 2005    Quit date: 2015    Years since quitting: 9.9   Smokeless tobacco: Never  Vaping Use   Vaping status: Every Day   Substances: Nicotine, Flavoring, Nicotine-salt   Devices: unsure name  Substance and Sexual Activity   Alcohol use: Yes    Comment: rarely   Drug use: Never   Sexual activity: Not Currently    Birth control/protection: Pill  Other Topics Concern   Not on file  Social History Narrative   Not on file   Social Determinants of Health   Financial Resource Strain: Medium Risk (08/17/2023)   Overall Financial Resource Strain (CARDIA)    Difficulty of Paying Living Expenses: Somewhat hard  Food Insecurity: Food Insecurity Present (08/17/2023)   Hunger Vital Sign    Worried About Running Out of Food in the Last Year: Sometimes true    Ran Out of Food in the Last Year:  Often true  Transportation Needs: No Transportation Needs (08/17/2023)   PRAPARE - Administrator, Civil Service (Medical): No    Lack of Transportation (Non-Medical): No  Physical Activity: Insufficiently Active (08/17/2023)   Exercise Vital Sign    Days of Exercise per Week: 3 days    Minutes of Exercise per Session: 10 min  Stress: Stress Concern Present (08/17/2023)   [employer] of Occupational Health - Occupational Stress Questionnaire    Feeling of Stress : Very much  Social Connections: Socially Isolated (08/17/2023)   Social Connection and Isolation Panel [NHANES]    Frequency of Communication with Friends and Family: More than three times a week    Frequency of Social Gatherings with Friends and Family: Never    Attends Religious Services: Never    Database administrator or Organizations: No    Attends Engineer, structural: Not on file    Marital Status: Separated    Additional Social History:   Allergies:   Allergies   Allergen Reactions   Codeine Other (See Comments)    Hallucinations    Onion Swelling   Topamax [Topiramate]     Was talking out of her head, walked into walls    Metabolic Disorder Labs: Lab Results  Component Value Date   HGBA1C 5.3 07/25/2022   No results found for: "PROLACTIN" Lab Results  Component Value Date   CHOL 109 07/25/2022   TRIG 76 07/25/2022   HDL 35 (L) 07/25/2022   CHOLHDL 3.1 07/25/2022   LDLCALC 58 07/25/2022   Lab Results  Component Value Date   TSH 1.280 07/25/2022    Therapeutic Level Labs: No results found for: "LITHIUM" No results found for: "CBMZ" No results found for: "VALPROATE"  Current Medications: Current Outpatient Medications  Medication Sig Dispense Refill   mirtazapine (REMERON) 7.5 MG tablet Take 1 tablet (7.5 mg total) by mouth at bedtime. 45 tablet 0   atenolol (TENORMIN) 50 MG tablet Take 1 tablet (50 mg total) by mouth daily. 90 tablet 0   baclofen (LIORESAL) 10 MG tablet TAKE 2 TABLETS BY MOUTH 3 TIMES A DAY FOR SPASTICITY 540 tablet 0   clonazePAM (KLONOPIN) 1 MG tablet Take 1 tablet (1 mg total) by mouth 2 (two) times daily as needed for anxiety. 30 tablet 3   drospirenone-ethinyl estradiol (YAZ) 3-0.02 MG tablet Take 1 tablet by mouth daily. 84 tablet 0   DULoxetine (CYMBALTA) 60 MG capsule TAKE 1 CAPSULE BY MOUTH EVERY DAY 90 capsule 0   etodolac (LODINE) 400 MG tablet Take 1 tablet (400 mg total) by mouth 2 (two) times daily. 180 tablet 1   gabapentin (NEURONTIN) 600 MG tablet Take 1 tablet (600 mg total) by mouth 4 (four) times daily. 360 tablet 1   KESIMPTA 20 MG/0.4ML SOAJ Inject 0.4 mLs into the skin every 30 (thirty) days. 1.2 mL 2   meclizine (ANTIVERT) 25 MG tablet Take 1 tablet (25 mg total) by mouth 3 (three) times daily as needed for dizziness. 30 tablet 11   pantoprazole (PROTONIX) 40 MG tablet Take 1 tablet (40 mg total) by mouth 2 (two) times daily. 180 tablet 0   Semaglutide-Weight Management (WEGOVY) 0.5  MG/0.5ML SOAJ Inject 0.5 mg into the skin once a week. 2 mL 1   SUMAtriptan (IMITREX) 50 MG tablet Take 1 tablet (50 mg total) by mouth daily as needed for migraine. May repeat in 2 hours if headache persists or recurs. 10 tablet 11   No  current facility-administered medications for this visit.    Musculoskeletal: Strength & Muscle Tone: within normal limits Gait & Station: normal Patient leans: N/A  Psychiatric Specialty Exam: Review of Systems  Psychiatric/Behavioral:  Positive for sleep disturbance. Negative for hallucinations. The patient is nervous/anxious. The patient is not hyperactive.   All other systems reviewed and are negative.   Blood pressure 116/77, pulse 76, height 5\' 4"  (1.626 m), weight 202 lb (91.6 kg).Body mass index is 34.67 kg/m.  General Appearance: Casual  Eye Contact:  Good  Speech:  Clear and Coherent  Volume:  Normal  Mood:  Anxious and Depressed  Affect:  Congruent  Thought Process:  Coherent  Orientation:  Full (Time, Place, and Person)  Thought Content:  Logical  Suicidal Thoughts:  No  Homicidal Thoughts:  No  Memory:  Immediate;   Good Recent;   Good  Judgement:  Good  Insight:  Good  Psychomotor Activity:  Normal  Concentration:  Concentration: Good  Recall:  Good  Fund of Knowledge:Good  Language: Good  Akathisia:  No  Handed:  Right  AIMS (if indicated):  done  Assets:  Communication Skills Desire for Improvement  ADL's:  Intact  Cognition: WNL  Sleep:  Poor   Screenings: PHQ2-9    Flowsheet Row Office Visit from [facility]/03/20[facility] in [facility] Office Visit from 08/21/2023 in [facility] Office Visit from 07/21/2022 in [facility] Office Visit from 07/04/2022 in [facility] Office Visit from 07/15/2018 in [facility]  PHQ-2 Total Score 2 2 4 1  0  PHQ-9 Total Score 5 7 19  -- --      Flowsheet Row  Admission (Discharged) from 02/08/2023 in [facility] ED from 09/27/2021 in [facility] Emergency Department at [facility] Admission (Discharged) from 09/20/2021 in [facility]  C-SSRS RISK CATEGORY No Risk No Risk Error: Question 2 not populated       Assessment and Plan: Virginia [name] 42 year old Caucasian female presents to establish care.  States she was referred by her primary care provider due to insomnia.  She reports she has tried multiple medications in the past without symptom relief.  Denied concerns related to suicidal or homicidal ideations.  Discussed initiating medications listed below.  Sleep disturbance: Collaboration of Care: Medication Management AEB discussed starting Remeron 7.5 mg to Remeron 15 mg nightly -Sideration for sleep study/clinic -Follow-up for EKG, will monitor for QT prolongation with medication -Consideration for Seroquel, doxepin and/or Unisom -Patient to follow-up 2 weeks for medication tolerability/adherence  Patient/Guardian was advised Release of Information must be obtained prior to any record release in order to collaborate their care with an outside provider. Patient/Guardian was advised if they have not already done so to contact the registration department to sign all necessary forms in order for [place] to release information regarding their care.   Consent: Patient/Guardian gives verbal consent for treatment and assignment of benefits for services provided during this visit. Patient/Guardian expressed understanding and agreed to proceed.   [name], NP 12/4/20249:57 PM

## 2023-10-04 ENCOUNTER — Other Ambulatory Visit: Payer: Self-pay | Admitting: Neurology

## 2023-10-04 ENCOUNTER — Ambulatory Visit: Payer: BC Managed Care – PPO

## 2023-10-04 VITALS — BP 107/83 | HR 76

## 2023-10-04 DIAGNOSIS — R2681 Unsteadiness on feet: Secondary | ICD-10-CM

## 2023-10-04 DIAGNOSIS — R279 Unspecified lack of coordination: Secondary | ICD-10-CM | POA: Diagnosis not present

## 2023-10-04 DIAGNOSIS — M6281 Muscle weakness (generalized): Secondary | ICD-10-CM

## 2023-10-04 NOTE — Therapy (Signed)
OUTPATIENT PHYSICAL THERAPY NEURO TREATMENT   Patient Name: Virginia [name] MRN: [id number] DOB:12/21/1980, 42 y.o., female Today's Date: 10/04/2023   PCP: [name], MD  REFERRING PROVIDER: Shawnie Dapper, NP  END OF SESSION:  PT End of Session - 10/04/23 1453     Visit Number 4    Number of Visits 9    Date for PT Re-Evaluation 10/19/23    Authorization Type Blue Cross Blue Shield    PT Start Time 1445    PT Stop Time 1527    PT Time Calculation (min) 42 min    Activity Tolerance Patient tolerated treatment well    Behavior During Therapy WFL for tasks assessed/performed              Past Medical History:  Diagnosis Date   Arthritis    Chronic migraine w/o aura w/o status migrainosus, not intractable    followed by [name]   GAD (generalized anxiety disorder)    GERD (gastroesophageal reflux disease)    Hemorrhoids    History of cervical dysplasia    followed by GYN--- dr Henderson [place];    hx CIN 2  s/p  colposcopy in office 05/ 2022 and CIN 3  s/p LEEP 11/ 2022   History of syncope 02/2016   admission in epic;  in setting of vertigo and dx w/ MS   Insomnia    Multiple sclerosis, relapsing-remitting (HCC) 02/2016   neurologist--- [name];  dx 05/ 2017 lumbar puncture,  dyesthesia, fatigue, vertigo   PAC (premature atrial contraction)    PSVT (paroxysmal supraventricular tachycardia) (HCC) 2019   (02-01-2023 pt stated followed by pcp) // cardiologist--- dr g. taylor;  (lov in epic 06-21-2018)  evaluated 2019 after dx MS in 2017 with syncope/ palpitations;   event monitor showed PSVT,  PACs/ PVCs   PVC (premature ventricular contraction)    Sleep apnea, unspecified    study in epic 08-07-2018  borderline sleep apnea,  recommendation loss wt, sleep hygiene, mouth guard   Past Surgical History:  Procedure Laterality Date   COLONOSCOPY WITH ESOPHAGOGASTRODUODENOSCOPY (EGD)  11/15/2022   [name];   EVALUATION UNDER ANESTHESIA WITH HEMORRHOIDECTOMY N/A 02/08/2023    Procedure: ANORECTAL EXAM UNDER ANESTHESIA;  Surgeon: [name], MD;  Location: [facility];  Service: General;  Laterality: N/A;   HEMORRHOID SURGERY  02/08/2023   Procedure: EXTERNAL HEMORRHOIDECTOMY X2;  Surgeon: [name], MD;  Location: [facility];  Service: General;;   LEEP N/A 09/20/2021   Procedure: LOOP ELECTROSURGICAL EXCISION PROCEDURE (LEEP);  Surgeon: [name], MD;  Location: [facility];  Service: Gynecology;  Laterality: N/A;   Patient Active Problem List   Diagnosis Date Noted   Body mass index (BMI) 34.0-34.9, adult 09/05/2023   Leiomyoma 08/14/2023   Family history of breast cancer 07/25/2022   Chronic migraine w/o aura w/o status migrainosus, not intractable 09/01/2021   Dysplasia of cervix, high grade CIN 2 03/18/2021   LGSIL on Pap smear of cervix 02/28/2021   Vertigo of central origin 06/24/2020   Numbness 06/24/2020   Panic disorder 11/11/2019   Current mild episode of major depressive disorder without prior episode (HCC) 11/11/2019   Snoring 06/20/2018   Excessive daytime sleepiness 06/20/2018   Neck pain 01/01/2018   Palpitations 01/01/2018   Myalgia 09/03/2017   Chronic low back pain without sciatica 09/03/2017   Right leg pain 07/26/2017   Bilateral sciatica 07/12/2017   Nystagmus 02/15/2017   Spell of  altered consciousness 07/19/2016   Ataxic gait 05/31/2016   Other fatigue 05/31/2016   Dysesthesia 05/31/2016   High risk medication use 03/31/2016   Insomnia 03/31/2016   Dizziness    Syncope    Multiple sclerosis (HCC) 03/21/2016   Relapsing remitting multiple sclerosis (HCC)    Vertigo    White matter abnormality on MRI of brain 03/20/2016   Faintness     ONSET DATE: Referral  09/11/23  REFERRING DIAG: Diagnosis G35 (ICD-10-CM) - Relapsing remitting multiple sclerosis (HCC) W19.Lorne Skeens (ICD-10-CM) - Fall, initial encounter  THERAPY DIAG:  Unsteadiness on feet  Muscle weakness  (generalized)  Unspecified lack of coordination  Rationale for Evaluation and Treatment: Rehabilitation  SUBJECTIVE:                                                                                                                                                                                             SUBJECTIVE STATEMENT: Patient reported still tired today but not as bad as last session. Denied falls. Forgot to call about cooling vest but still has information. Headache 4/10 at time of session.    Pt accompanied by: self  PERTINENT HISTORY: RRMS, anxiety, arthritis  PAIN:  Are you having pain?  Dropped something on R foot earlier, back pain all the time  PRECAUTIONS: Fall  RED FLAGS: None   WEIGHT BEARING RESTRICTIONS: No  FALLS: Has patient fallen in last 6 months? Yes. Number of falls 20-30   LIVING ENVIRONMENT: Lives with: lives with their spouse Lives in: House/apartment Stairs: Yes: External: 4-5 steps; none Has following equipment at home: Crutches and Grab bars  PLOF: Independent  PATIENT GOALS: "strengthening up my legs"   OBJECTIVE:  Note: Objective measures were completed at Evaluation unless otherwise noted.  DIAGNOSTIC FINDINGS: Brain MRI 04/09/22 IMPRESSION: This MRI of the brain without contrast shows the following: 1.   T2/FLAIR hyperintense foci in the cerebral hemispheres and the left medulla in a pattern consistent with chronic demyelinating plaque associated with multiple sclerosis.  None of the foci appear to be acute.  Compared to the MRI dated 01/20/2020, there are no new lesions. 2.   No acute findings.   COGNITION: Overall cognitive status: Within functional limits for tasks assessed   TODAY'S TREATMENT:  Vitals:   10/04/23 1447  BP: 107/83  Pulse: 76   NuStep: - cardio warmup 6 min   Farmer's Carry - 10 lb  KB, more noticeable compensatory lean when KB in L UE   Sumo Squats  - 10 lb KB, cueing for form  - 2x8   Deadlifts: - 10 lb KB, cueing for form  - 2x8   Step Ups - Fwd 2x10 bilat, UE support  - Lat  2x10 bilat, UE support  Calf raises: - small foam roll btw ankles  - 2x6   Bird dogs: - LE only  - 2x8     PATIENT EDUCATION: Education details: Initial HEP + results + cooling vest Person educated: Patient Education method: Explanation Education comprehension: verbalized understanding  HOME EXERCISE PROGRAM: Access Code: [email or web address] URL: [email or web address] Date: 09/25/2023 Prepared by: Maryruth Eve  Exercises - Sit to Stand Without Arm Support  - 1 x daily - 7 x weekly - 2-3 sets - 5-8 reps - Seated Heel Toe Raises  - 1 x daily - 7 x weekly - 3 sets - 10 reps - Bird Dog on Counter  - 1 x daily - 7 x weekly - 3 sets - 5-10 reps - Side Stepping with Resistance at Ankles and Counter Support  - 1 x daily - 4 x weekly - 3 sets - Forward and Backward Monster Walk with Resistance at Ankles and Counter Support  - 1 x daily - 4 x weekly - 3 sets - 10 reps  GOALS: Goals reviewed with patient? Yes  SHORT TERM GOALS: Target date: same as LTG     LONG TERM GOALS: Target date: 10/19/23  Patient will be independent with final HEP for improved balance and decreased fall risk.   Baseline: to be provided  Goal status: INITIAL  2.  Patient will demonstrate mild to no sway with Condition 4 of MCTSIB to demonstrate improved balance.  Baseline: mod sway and SBA Goal status: INITIAL  3.  Patient will improve gait speed to 1.3 m/s to increase safety with community ambulation distances.  Baseline: 0.78 m/s Goal status: INITIAL  4.  Patient will improve to 850 feet to demonstrate improved aerobic capacity and endurance in order to participate more easily in daily walking tasks.   Baseline: 756 feet without AD Goal status:  INITIAL    ASSESSMENT:  CLINICAL IMPRESSION: Patient seen today for skilled physical therapy session with emphasis on functional LE strength. Patient willing to try all exercises and verbalized feeling more awake by end of session. Noticeable tremors in bilat LE attributed to muscle weakness. Lateral step ups on L LE and calf raises were most challenging. Required verbal cueing for proper form. Verbalized independence with HEP. Continue POC.   OBJECTIVE IMPAIRMENTS: Abnormal gait, decreased balance, difficulty walking, decreased strength, and decreased safety awareness.   ACTIVITY LIMITATIONS: carrying, lifting, bending, and stairs  PARTICIPATION LIMITATIONS: driving and community activity  PERSONAL FACTORS: Age, Sex, Time since onset of injury/illness/exacerbation, and 1 comorbidity: RRMS  are also affecting patient's functional outcome.   REHAB POTENTIAL: Good  CLINICAL DECISION MAKING: Stable/uncomplicated  EVALUATION COMPLEXITY: Low  PLAN:  PT FREQUENCY: 2x/week  PT DURATION: 4 weeks  PLANNED INTERVENTIONS: 97164- PT Re-evaluation, 97110-Therapeutic exercises, 97530- Therapeutic activity, 97112- Neuromuscular re-education, 97535- Self Care, 21308- Manual therapy, [phone]- Gait training, [phone]- Orthotic Fit/training, [phone]- Canalith repositioning, Patient/Family education, Balance training, Stair training, Vestibular training, Visual/preceptual remediation/compensation, and DME instructions  PLAN FOR NEXT SESSION: work on eccentric lower  tasks, work on step overs or modified SLS tasks, higher level balance, functional strength with dead lift/hip hinge + add to HEP, did patient look into getting cooling vest for warmer weather, assess vitals as BP low last session    Isam Unrein M Ashmi Blas, SPT  10/04/2023, 3:28 PM

## 2023-10-04 NOTE — Telephone Encounter (Signed)
Last seen on 09/11/23 Follow up scheduled 03/27/23 90 day supply filled on 07/03/23 Too soon to refill

## 2023-10-05 ENCOUNTER — Encounter: Payer: Self-pay | Admitting: Neurology

## 2023-10-08 ENCOUNTER — Other Ambulatory Visit: Payer: Self-pay

## 2023-10-08 MED ORDER — BACLOFEN 10 MG PO TABS: ORAL_TABLET | ORAL | 0 refills | Status: DC

## 2023-10-09 ENCOUNTER — Ambulatory Visit: Payer: BC Managed Care – PPO

## 2023-10-11 ENCOUNTER — Ambulatory Visit: Payer: BC Managed Care – PPO

## 2023-10-11 VITALS — BP 104/71 | HR 80

## 2023-10-11 DIAGNOSIS — R2681 Unsteadiness on feet: Secondary | ICD-10-CM

## 2023-10-11 DIAGNOSIS — M6281 Muscle weakness (generalized): Secondary | ICD-10-CM | POA: Diagnosis not present

## 2023-10-11 DIAGNOSIS — R279 Unspecified lack of coordination: Secondary | ICD-10-CM

## 2023-10-11 NOTE — Therapy (Signed)
OUTPATIENT PHYSICAL THERAPY NEURO TREATMENT   Patient Name: Virginia [name] MRN: [id number] DOB:[birth year], 42 y.o., female Today's Date: 10/11/2023   PCP: [name], MD  REFERRING PROVIDER: Shawnie Dapper, NP  END OF SESSION:  PT End of Session - 10/11/23 1535     Visit Number 5    Number of Visits 9    Date for PT Re-Evaluation 10/19/23    Authorization Type [employer]    PT Start Time 1535    PT Stop Time 1615    PT Time Calculation (min) 40 min    Activity Tolerance Patient limited by pain              Past Medical History:  Diagnosis Date   Arthritis    Chronic migraine w/o aura w/o status migrainosus, not intractable    followed by [name]   GAD (generalized anxiety disorder)    GERD (gastroesophageal reflux disease)    Hemorrhoids    History of cervical dysplasia    followed by GYN--- dr Henderson [place];    hx CIN 2  s/p  colposcopy in office 05/ 2022 and CIN 3  s/p LEEP 11/ 2022   History of syncope 02/2016   admission in epic;  in setting of vertigo and dx w/ MS   Insomnia    Multiple sclerosis, relapsing-remitting (HCC) 02/2016   neurologist--- [name];  dx 05/ 2017 lumbar puncture,  dyesthesia, fatigue, vertigo   PAC (premature atrial contraction)    PSVT (paroxysmal supraventricular tachycardia) (HCC) 2019   (02-01-2023 pt stated followed by pcp) // cardiologist--- dr g. taylor;  (lov in epic 06-21-2018)  evaluated 2019 after dx MS in 2017 with syncope/ palpitations;   event monitor showed PSVT,  PACs/ PVCs   PVC (premature ventricular contraction)    Sleep apnea, unspecified    study in epic 08-07-2018  borderline sleep apnea,  recommendation loss wt, sleep hygiene, mouth guard   Past Surgical History:  Procedure Laterality Date   COLONOSCOPY WITH ESOPHAGOGASTRODUODENOSCOPY (EGD)  11/15/2022   [name];   EVALUATION UNDER ANESTHESIA WITH HEMORRHOIDECTOMY N/A 02/08/2023   Procedure: ANORECTAL EXAM UNDER ANESTHESIA;  Surgeon: [name], MD;  Location: [facility];  Service: General;  Laterality: N/A;   HEMORRHOID SURGERY  02/08/2023   Procedure: EXTERNAL HEMORRHOIDECTOMY X2;  Surgeon: [name], MD;  Location: [facility];  Service: General;;   LEEP N/A 09/20/2021   Procedure: LOOP ELECTROSURGICAL EXCISION PROCEDURE (LEEP);  Surgeon: [name], MD;  Location: [facility];  Service: Gynecology;  Laterality: N/A;   Patient Active Problem List   Diagnosis Date Noted   Body mass index (BMI) 34.0-34.9, adult 09/05/2023   Leiomyoma 08/14/2023   Family history of breast cancer 07/25/2022   Chronic migraine w/o aura w/o status migrainosus, not intractable 09/01/2021   Dysplasia of cervix, high grade CIN 2 03/18/2021   LGSIL on Pap smear of cervix 02/28/2021   Vertigo of central origin 06/24/2020   Numbness 06/24/2020   Panic disorder 11/11/2019   Current mild episode of major depressive disorder without prior episode (HCC) 11/11/2019   Snoring 06/20/2018   Excessive daytime sleepiness 06/20/2018   Neck pain 01/01/2018   Palpitations 01/01/2018   Myalgia 09/03/2017   Chronic low back pain without sciatica 09/03/2017   Right leg pain 07/26/2017   Bilateral sciatica 07/12/2017   Nystagmus 02/15/2017   Spell of altered consciousness 07/19/2016   Ataxic gait 05/31/2016  Other fatigue 05/31/2016   Dysesthesia 05/31/2016   High risk medication use 03/31/2016   Insomnia 03/31/2016   Dizziness    Syncope    Multiple sclerosis (HCC) 03/21/2016   Relapsing remitting multiple sclerosis (HCC)    Vertigo    White matter abnormality on MRI of brain 03/20/2016   Faintness     ONSET DATE: Referral  09/11/23  REFERRING DIAG: Diagnosis G35 (ICD-10-CM) - Relapsing remitting multiple sclerosis (HCC) W19.Lorne Skeens (ICD-10-CM) - Fall, initial encounter  THERAPY DIAG:  Unsteadiness on feet  Unspecified lack of coordination  Muscle weakness (generalized)  Rationale for  Evaluation and Treatment: Rehabilitation  SUBJECTIVE:                                                                                                                                                                                             SUBJECTIVE STATEMENT: Patient reported some low back pain and cramping in her L LE. Helped a friend move yesterday which required lots of stairs and lifting. Denied falls.   Pt accompanied by: self  PERTINENT HISTORY: RRMS, anxiety, arthritis  PAIN:  Are you having pain?  Dropped something on R foot earlier, back pain all the time  PRECAUTIONS: Fall  RED FLAGS: None   WEIGHT BEARING RESTRICTIONS: No  FALLS: Has patient fallen in last 6 months? Yes. Number of falls 20-30   LIVING ENVIRONMENT: Lives with: lives with their spouse Lives in: House/apartment Stairs: Yes: External: 4-5 steps; none Has following equipment at home: Crutches and Grab bars  PLOF: Independent  PATIENT GOALS: "strengthening up my legs"   OBJECTIVE:  Note: Objective measures were completed at Evaluation unless otherwise noted.  DIAGNOSTIC FINDINGS: Brain MRI 04/09/22 IMPRESSION: This MRI of the brain without contrast shows the following: 1.   T2/FLAIR hyperintense foci in the cerebral hemispheres and the left medulla in a pattern consistent with chronic demyelinating plaque associated with multiple sclerosis.  None of the foci appear to be acute.  Compared to the MRI dated 01/20/2020, there are no new lesions. 2.   No acute findings.   COGNITION: Overall cognitive status: Within functional limits for tasks assessed   TODAY'S TREATMENT:  Vitals:   10/11/23 1538 10/11/23 1550  BP: 97/67 104/71  Pulse: (!) 51 80    SciFit:  - multipeak, level 2.5, 8 min  - cardio warmup  Physioball rollouts   Lateral lunges   Lower trunk rotations    Deep neck flexor supine   Sitting scap retractions   Double knee to chest    PATIENT EDUCATION: Education details: Initial HEP + results + cooling vest Person educated: Patient Education method: Explanation Education comprehension: verbalized understanding  HOME EXERCISE PROGRAM: Access Code: [email or web address] URL: [email or web address] Date: 09/25/2023 Prepared by: Maryruth Eve  Exercises - Sit to Stand Without Arm Support  - 1 x daily - 7 x weekly - 2-3 sets - 5-8 reps - Seated Heel Toe Raises  - 1 x daily - 7 x weekly - 3 sets - 10 reps - Bird Dog on Counter  - 1 x daily - 7 x weekly - 3 sets - 5-10 reps - Side Stepping with Resistance at Ankles and Counter Support  - 1 x daily - 4 x weekly - 3 sets - Forward and Backward Monster Walk with Resistance at Ankles and Counter Support  - 1 x daily - 4 x weekly - 3 sets - 10 reps  GOALS: Goals reviewed with patient? Yes  SHORT TERM GOALS: Target date: same as LTG     LONG TERM GOALS: Target date: 10/19/23  Patient will be independent with final HEP for improved balance and decreased fall risk.   Baseline: to be provided  Goal status: INITIAL  2.  Patient will demonstrate mild to no sway with Condition 4 of MCTSIB to demonstrate improved balance.  Baseline: mod sway and SBA Goal status: INITIAL  3.  Patient will improve gait speed to 1.3 m/s to increase safety with community ambulation distances.  Baseline: 0.78 m/s Goal status: INITIAL  4.  Patient will improve to 850 feet to demonstrate improved aerobic capacity and endurance in order to participate more easily in daily walking tasks.   Baseline: 756 feet without AD Goal status: INITIAL    ASSESSMENT:  CLINICAL IMPRESSION: Patient seen today for skilled physical therapy session with emphasis on functional ROM. Patient having significant low back pain from repetitive lifting yesterday, causing trunk lean to the right. Blood pressure and heart  rate came up following SciFit. Patient felt some relief from stretching. Educated patient on if using heat to be cautious due to her MS. Continue POC.   OBJECTIVE IMPAIRMENTS: Abnormal gait, decreased balance, difficulty walking, decreased strength, and decreased safety awareness.   ACTIVITY LIMITATIONS: carrying, lifting, bending, and stairs  PARTICIPATION LIMITATIONS: driving and community activity  PERSONAL FACTORS: Age, Sex, Time since onset of injury/illness/exacerbation, and 1 comorbidity: RRMS  are also affecting patient's functional outcome.   REHAB POTENTIAL: Good  CLINICAL DECISION MAKING: Stable/uncomplicated  EVALUATION COMPLEXITY: Low  PLAN:  PT FREQUENCY: 2x/week  PT DURATION: 4 weeks  PLANNED INTERVENTIONS: 97164- PT Re-evaluation, 97110-Therapeutic exercises, 97530- Therapeutic activity, 97112- Neuromuscular re-education, 97535- Self Care, 13244- Manual therapy, [phone]- Gait training, [phone]- Orthotic Fit/training, [phone]- Canalith repositioning, Patient/Family education, Balance training, Stair training, Vestibular training, Visual/preceptual remediation/compensation, and DME instructions  PLAN FOR NEXT SESSION: work on eccentric lower tasks, work on step overs or modified SLS tasks, higher level balance, functional strength with dead lift/hip hinge + add to HEP, did patient look into getting cooling vest for warmer weather, assess vitals as BP low last session    Bradd Burner Rakim Moone, SPT  10/11/2023,  4:18 PM

## 2023-10-12 ENCOUNTER — Ambulatory Visit
Admission: RE | Admit: 2023-10-12 | Discharge: 2023-10-12 | Disposition: A | Discharge status: Home / Self Care | Payer: BC Managed Care – PPO | Source: Ambulatory Visit | Attending: Obstetrics and Gynecology | Admitting: Obstetrics and Gynecology

## 2023-10-12 DIAGNOSIS — Z1231 Encounter for screening mammogram for malignant neoplasm of breast: Secondary | ICD-10-CM

## 2023-10-15 ENCOUNTER — Telehealth (HOSPITAL_BASED_OUTPATIENT_CLINIC_OR_DEPARTMENT_OTHER): Payer: BC Managed Care – PPO | Admitting: Family

## 2023-10-15 DIAGNOSIS — G479 Sleep disorder, unspecified: Secondary | ICD-10-CM | POA: Diagnosis not present

## 2023-10-15 MED ORDER — QUETIAPINE FUMARATE 25 MG PO TABS
25.0000 mg | ORAL_TABLET | Freq: Every day | ORAL | 30 refills | Status: DC
Start: 2023-10-15 — End: 2024-01-25

## 2023-10-15 NOTE — Progress Notes (Signed)
Virtual Visit via Video Note  I connected with Virginia [name] on 10/15/23 at 12:00 PM EST by a video enabled telemedicine application and verified that I am speaking with the correct person using two identifiers.  Location: Patient: Home Provider: Office    I discussed the limitations of evaluation and management by telemedicine and the availability of in person appointments. The patient expressed understanding and agreed to proceed.   I discussed the assessment and treatment plan with the patient. The patient was provided an opportunity to ask questions and all were answered. The patient agreed with the plan and demonstrated an understanding of the instructions.   The patient was advised to call back or seek an in-person evaluation if the symptoms worsen or if the condition fails to improve as anticipated.  I provided 00 minutes of non-face-to-face time during this encounter.   [name], NP   [facility] MD/PA/NP OP Progress Note  10/15/2023 12:11 PM Bailie Courtney  MRN:  [id number]  Chief Complaint: " I had horrible headache with Remeron."  HPI: Virginia [name] was seen and evaluated through caregility video assessment.  She presents with a bright and pleasant affect.  Reports ongoing issues related to sleep disturbance.  Patient presented to follow-up for medication management appointment.  She was initiated on Remeron 7.5 mg to 50 mg nightly for reported sleep disturbance and mood stabilization.  States she has been unable to tolerate medication since and has self discontinued.  States she is seeking to try another medication to help with her sleep/mood.  Discussed initiating Seroquel 25 mg-50 mg nightly as needed.  This provider will follow-up telephonically on 12/20 for medication adherence/tolerability.   Has documented failed medications with trazodone, amitriptyline, Ambien Klonopin and Remeron.  Initiated Seroquel 25 to 50 mg.   Fleet Contras is alert oriented x 3.  She presents with a  calm and pleasant affect.  Speaking in clear and moderate tone.  Patient presents with good eye contact her thought process is clear and relevant no indication that she injured responding to internal or external stimuli.  No concerns for safety related to suicidal or homicidal ideation.  Does not appear to be experiencing delusional or paranoid content.  Patient answered all questions appropriately.  Will continue to monitor symptoms related to sleep.  Consideration for sleep study.  Discussed following up for EKG related to QT prolongation.   Visit Diagnosis:    ICD-10-CM   1. Sleep disturbance  G47.9       Past Psychiatric History: see chart   Past Medical History:  Past Medical History:  Diagnosis Date   Arthritis    Chronic migraine w/o aura w/o status migrainosus, not intractable    followed by [name]   GAD (generalized anxiety disorder)    GERD (gastroesophageal reflux disease)    Hemorrhoids    History of cervical dysplasia    followed by GYN--- [name]. harris;    hx CIN 2  s/p  colposcopy in office 05/ 2022 and CIN 3  s/p LEEP 11/ 2022   History of syncope 02/2016   admission in epic;  in setting of vertigo and dx w/ MS   Insomnia    Multiple sclerosis, relapsing-remitting (HCC) 02/2016   neurologist--- [name];  dx 05/ 2017 lumbar puncture,  dyesthesia, fatigue, vertigo   PAC (premature atrial contraction)    PSVT (paroxysmal supraventricular tachycardia) (HCC) 2019   (02-01-2023 pt stated followed by pcp) // cardiologist--- dr g. taylor;  (lov in epic  06-21-2018)  evaluated 2019 after dx MS in 2017 with syncope/ palpitations;   event monitor showed PSVT,  PACs/ PVCs   PVC (premature ventricular contraction)    Sleep apnea, unspecified    study in epic 08-07-2018  borderline sleep apnea,  recommendation loss wt, sleep hygiene, mouth guard    Past Surgical History:  Procedure Laterality Date   COLONOSCOPY WITH ESOPHAGOGASTRODUODENOSCOPY (EGD)  11/15/2022   [name];    EVALUATION UNDER ANESTHESIA WITH HEMORRHOIDECTOMY N/A 02/08/2023   Procedure: ANORECTAL EXAM UNDER ANESTHESIA;  Surgeon: [name], MD;  Location: [facility] ;  Service: General;  Laterality: N/A;   HEMORRHOID SURGERY  02/08/2023   Procedure: EXTERNAL HEMORRHOIDECTOMY X2;  Surgeon: [name], MD;  Location: [facility];  Service: General;;   LEEP N/A 09/20/2021   Procedure: LOOP ELECTROSURGICAL EXCISION PROCEDURE (LEEP);  Surgeon: [name], MD;  Location: [facility];  Service: Gynecology;  Laterality: N/A;    Family Psychiatric History:   Family History:  Family History  Problem Relation Age of Onset   Emphysema Mother    Osteoarthritis Mother    Anxiety disorder Mother    Uterine cancer Mother 104   Other Father    Suicidality Father    Breast cancer Maternal Aunt    Breast cancer Maternal Aunt    Breast cancer Maternal Aunt    ALS Maternal Grandmother    Colon cancer Neg Hx    Colon polyps Neg Hx    Esophageal cancer Neg Hx    Rectal cancer Neg Hx    Stomach cancer Neg Hx     Social History:  Social History   Socioeconomic History   Marital status: Married    Spouse name: Not on file   Number of children: Not on file   Years of education: Not on file   Highest education level: 12th grade  Occupational History   Not on file  Tobacco Use   Smoking status: Former    Current packs/day: 0.00    Average packs/day: 0.5 packs/day for 10.0 years (5.0 ttl pk-yrs)    Types: Cigarettes    Start date: 2005    Quit date: 2015    Years since quitting: 9.9   Smokeless tobacco: Never  Vaping Use   Vaping status: Every Day   Substances: Nicotine, Flavoring, Nicotine-salt   Devices: unsure name  Substance and Sexual Activity   Alcohol use: Yes    Comment: rarely   Drug use: Never   Sexual activity: Not Currently    Birth control/protection: Pill  Other Topics Concern   Not on file  Social History Narrative   Not on  file   Social Drivers of Health   Financial Resource Strain: Medium Risk (08/17/2023)   Overall Financial Resource Strain (CARDIA)    Difficulty of Paying Living Expenses: Somewhat hard  Food Insecurity: Food Insecurity Present (08/17/2023)   Hunger Vital Sign    Worried About Running Out of Food in the Last Year: Sometimes true    Ran Out of Food in the Last Year: Often true  Transportation Needs: No Transportation Needs (08/17/2023)   PRAPARE - Administrator, Civil Service (Medical): No    Lack of Transportation (Non-Medical): No  Physical Activity: Insufficiently Active (08/17/2023)   Exercise Vital Sign    Days of Exercise per Week: 3 days    Minutes of Exercise per Session: 10 min  Stress: Stress Concern Present (08/17/2023)   [place]  Institute of Occupational Health - Occupational Stress Questionnaire    Feeling of Stress : Very much  Social Connections: Socially Isolated (08/17/2023)   Social Connection and Isolation Panel [NHANES]    Frequency of Communication with Friends and Family: More than three times a week    Frequency of Social Gatherings with Friends and Family: Never    Attends Religious Services: Never    Database administrator or Organizations: No    Attends Engineer, structural: Not on file    Marital Status: Separated    Allergies:  Allergies  Allergen Reactions   Codeine Other (See Comments)    Hallucinations    Onion Swelling   Topamax [Topiramate]     Was talking out of her head, walked into walls    Metabolic Disorder Labs: Lab Results  Component Value Date   HGBA1C 5.3 07/25/2022   No results found for: "PROLACTIN" Lab Results  Component Value Date   CHOL 109 07/25/2022   TRIG 76 07/25/2022   HDL 35 (L) 07/25/2022   CHOLHDL 3.1 07/25/2022   LDLCALC 58 07/25/2022   Lab Results  Component Value Date   TSH 1.280 07/25/2022   TSH 1.010 01/01/2018    Therapeutic Level Labs: No results found for: "LITHIUM" No  results found for: "VALPROATE" No results found for: "CBMZ"  Current Medications: Current Outpatient Medications  Medication Sig Dispense Refill   QUEtiapine (SEROQUEL) 25 MG tablet Take 1 tablet (25 mg total) by mouth at bedtime. 30 tablet 30   atenolol (TENORMIN) 50 MG tablet Take 1 tablet (50 mg total) by mouth daily. 90 tablet 0   baclofen (LIORESAL) 10 MG tablet TAKE 2 TABLETS BY MOUTH 3 TIMES A DAY FOR SPASTICITY 540 tablet 0   clonazePAM (KLONOPIN) 1 MG tablet Take 1 tablet (1 mg total) by mouth 2 (two) times daily as needed for anxiety. 30 tablet 3   drospirenone-ethinyl estradiol (YAZ) 3-0.02 MG tablet Take 1 tablet by mouth daily. 84 tablet 0   DULoxetine (CYMBALTA) 60 MG capsule TAKE 1 CAPSULE BY MOUTH EVERY DAY 90 capsule 0   etodolac (LODINE) 400 MG tablet Take 1 tablet (400 mg total) by mouth 2 (two) times daily. 180 tablet 1   gabapentin (NEURONTIN) 600 MG tablet Take 1 tablet (600 mg total) by mouth 4 (four) times daily. 360 tablet 1   KESIMPTA 20 MG/0.4ML SOAJ Inject 0.4 mLs into the skin every 30 (thirty) days. 1.2 mL 2   meclizine (ANTIVERT) 25 MG tablet Take 1 tablet (25 mg total) by mouth 3 (three) times daily as needed for dizziness. 30 tablet 11   mirtazapine (REMERON) 7.5 MG tablet Take 1 tablet (7.5 mg total) by mouth at bedtime. 45 tablet 0   pantoprazole (PROTONIX) 40 MG tablet Take 1 tablet (40 mg total) by mouth 2 (two) times daily. 180 tablet 0   Semaglutide-Weight Management (WEGOVY) 0.5 MG/0.5ML SOAJ Inject 0.5 mg into the skin once a week. 2 mL 1   SUMAtriptan (IMITREX) 50 MG tablet Take 1 tablet (50 mg total) by mouth daily as needed for migraine. May repeat in 2 hours if headache persists or recurs. 10 tablet 11   No current facility-administered medications for this visit.     Musculoskeletal: Tele-assessment   Psychiatric Specialty Exam: Review of Systems  Psychiatric/Behavioral:  Positive for decreased concentration and sleep disturbance.   All  other systems reviewed and are negative.   There were no vitals taken for this visit.There is no  height or weight on file to calculate BMI.  General Appearance: Casual  Eye Contact:  Good  Speech:  Clear and Coherent  Volume:  Normal  Mood:  Anxious and Depressed  Affect:  Congruent  Thought Process:  Coherent  Orientation:  Full (Time, Place, and Person)  Thought Content: Logical   Suicidal Thoughts:  No  Homicidal Thoughts:  No  Memory:  Immediate;   Good Recent;   Good  Judgement:  Good  Insight:  Good  Psychomotor Activity:  Normal  Concentration:  Concentration: Good  Recall:  Good  Fund of Knowledge: Good  Language: Good  Akathisia:  No  Handed:  Right  AIMS (if indicated): not done  Assets:  Desire for Improvement Social Support  ADL's:  Intact  Cognition: WNL  Sleep:  Poor   Screenings: PHQ2-9    Flowsheet Row Office Visit from 10/03/2023 in [facility] Office Visit from 08/21/2023 in [facility] Office Visit from 07/21/2022 in [facility] Office Visit from 07/04/2022 in [facility] Office Visit from 07/15/2018 in [facility]  PHQ-2 Total Score 2 2 4 1  0  PHQ-9 Total Score 5 7 19  -- --      Flowsheet Row Admission (Discharged) from 02/08/2023 in [facility] ED from 09/27/2021 in [facility] at [facility] Admission (Discharged) from 09/20/2021 in [facility]  C-SSRS RISK CATEGORY No Risk No Risk Error: Question 2 not populated        Assessment and Plan: Virginia [name] 42 year old Caucasian female presents for medication management follow-up appointment.  Initially patient was prescribed Remeron 7.5 to 15 mg nightly however states medication side effects and has not taking medication as indicated.  States " horrible headaches" discussed initiating Unisom  and/or doxepin.  Discussed initiating Seroquel 25 to 50 mg nightly.  She was receptive to plan.  Will follow-up 4 days for medication tolerability/adherence.  Collaboration of Care: Collaboration of Care: Medication Management AEB initiated Seroquel 25 to 50 mg nightly for mood stabilization/sleep disturbance  Patient/Guardian was advised Release of Information must be obtained prior to any record release in order to collaborate their care with an outside provider. Patient/Guardian was advised if they have not already done so to contact the registration department to sign all necessary forms in order for [place] to release information regarding their care.   Consent: Patient/Guardian gives verbal consent for treatment and assignment of benefits for services provided during this visit. Patient/Guardian expressed understanding and agreed to proceed.    [name], NP 10/15/2023, 12:11 PM

## 2023-10-16 ENCOUNTER — Ambulatory Visit: Payer: BC Managed Care – PPO

## 2023-10-16 DIAGNOSIS — R279 Unspecified lack of coordination: Secondary | ICD-10-CM | POA: Diagnosis not present

## 2023-10-16 DIAGNOSIS — M6281 Muscle weakness (generalized): Secondary | ICD-10-CM | POA: Diagnosis not present

## 2023-10-16 DIAGNOSIS — R2681 Unsteadiness on feet: Secondary | ICD-10-CM

## 2023-10-16 NOTE — Therapy (Signed)
OUTPATIENT PHYSICAL THERAPY NEURO TREATMENT   Patient Name: Virginia [name] MRN: [id number] DOB:07/23/1981, 42 y.o., female Today's Date: 10/16/2023   PCP: [name], MD  REFERRING PROVIDER: Shawnie Dapper, NP  END OF SESSION:  PT End of Session - 10/16/23 1449     Visit Number 6    Number of Visits 9    Date for PT Re-Evaluation 10/19/23    Authorization Type [employer] [place]    PT Start Time [phone]   patient late   PT Stop Time 1528    PT Time Calculation (min) 39 min    Activity Tolerance Patient tolerated treatment well    Behavior During Therapy WFL for tasks assessed/performed              Past Medical History:  Diagnosis Date   Arthritis    Chronic migraine w/o aura w/o status migrainosus, not intractable    followed by [name]   GAD (generalized anxiety disorder)    GERD (gastroesophageal reflux disease)    Hemorrhoids    History of cervical dysplasia    followed by GYN--- dr Henderson [place];    hx CIN 2  s/p  colposcopy in office 05/ 2022 and CIN 3  s/p LEEP 11/ 2022   History of syncope 02/2016   admission in epic;  in setting of vertigo and dx w/ MS   Insomnia    Multiple sclerosis, relapsing-remitting (HCC) 02/2016   neurologist--- [name];  dx 05/ 2017 lumbar puncture,  dyesthesia, fatigue, vertigo   PAC (premature atrial contraction)    PSVT (paroxysmal supraventricular tachycardia) (HCC) 2019   (02-01-2023 pt stated followed by pcp) // cardiologist--- dr g. taylor;  (lov in epic 06-21-2018)  evaluated 2019 after dx MS in 2017 with syncope/ palpitations;   event monitor showed PSVT,  PACs/ PVCs   PVC (premature ventricular contraction)    Sleep apnea, unspecified    study in epic 08-07-2018  borderline sleep apnea,  recommendation loss wt, sleep hygiene, mouth guard   Past Surgical History:  Procedure Laterality Date   COLONOSCOPY WITH ESOPHAGOGASTRODUODENOSCOPY (EGD)  11/15/2022   [name];   EVALUATION UNDER ANESTHESIA WITH HEMORRHOIDECTOMY  N/A 02/08/2023   Procedure: ANORECTAL EXAM UNDER ANESTHESIA;  Surgeon: [name], MD;  Location: [facility] [place];  Service: General;  Laterality: N/A;   HEMORRHOID SURGERY  02/08/2023   Procedure: EXTERNAL HEMORRHOIDECTOMY X2;  Surgeon: [name], MD;  Location: [facility];  Service: General;;   LEEP N/A 09/20/2021   Procedure: LOOP ELECTROSURGICAL EXCISION PROCEDURE (LEEP);  Surgeon: [name], MD;  Location: [facility];  Service: Gynecology;  Laterality: N/A;   Patient Active Problem List   Diagnosis Date Noted   Body mass index (BMI) 34.0-34.9, adult 09/05/2023   Leiomyoma 08/14/2023   Family history of breast cancer 07/25/2022   Chronic migraine w/o aura w/o status migrainosus, not intractable 09/01/2021   Dysplasia of cervix, high grade CIN 2 03/18/2021   LGSIL on Pap smear of cervix 02/28/2021   Vertigo of central origin 06/24/2020   Numbness 06/24/2020   Panic disorder 11/11/2019   Current mild episode of major depressive disorder without prior episode (HCC) 11/11/2019   Snoring 06/20/2018   Excessive daytime sleepiness 06/20/2018   Neck pain 01/01/2018   Palpitations 01/01/2018   Myalgia 09/03/2017   Chronic low back pain without sciatica 09/03/2017   Right leg pain 07/26/2017   Bilateral sciatica 07/12/2017   Nystagmus 02/15/2017  Spell of altered consciousness 07/19/2016   Ataxic gait 05/31/2016   Other fatigue 05/31/2016   Dysesthesia 05/31/2016   High risk medication use 03/31/2016   Insomnia 03/31/2016   Dizziness    Syncope    Multiple sclerosis (HCC) 03/21/2016   Relapsing remitting multiple sclerosis (HCC)    Vertigo    White matter abnormality on MRI of brain 03/20/2016   Faintness     ONSET DATE: Referral  09/11/23  REFERRING DIAG: Diagnosis G35 (ICD-10-CM) - Relapsing remitting multiple sclerosis (HCC) W19.Lorne Skeens (ICD-10-CM) - Fall, initial encounter  THERAPY DIAG:  Unsteadiness on  feet  Unspecified lack of coordination  Muscle weakness (generalized)  Rationale for Evaluation and Treatment: Rehabilitation  SUBJECTIVE:                                                                                                                                                                                             SUBJECTIVE STATEMENT: Patient reports doing well, back is still sore, but better. Sleeping better since rx change. Denies falls.   Pt accompanied by: self  PERTINENT HISTORY: RRMS, anxiety, arthritis  PAIN:  Are you having pain? Yes: NPRS scale: 7/10 Pain location: R low back   PRECAUTIONS: Fall  PATIENT GOALS: "strengthening up my legs"   OBJECTIVE:  Note: Objective measures were completed at Evaluation unless otherwise noted.  DIAGNOSTIC FINDINGS: Brain MRI 04/09/22 IMPRESSION: This MRI of the brain without contrast shows the following: 1.   T2/FLAIR hyperintense foci in the cerebral hemispheres and the left medulla in a pattern consistent with chronic demyelinating plaque associated with multiple sclerosis.  None of the foci appear to be acute.  Compared to the MRI dated 01/20/2020, there are no new lesions. 2.   No acute findings.  TODAY'S TREATMENT:                                                                                                                               There were no vitals filed for this visit.  Therex: -anterior ball roll out x10, x10 B angles  -  SKTC with contralateral leg extended  -supine piriformis stretch  -prone press up -quadruped thread the needle  -prone Y's  -10 mins NuStep level 3 B UE/LE for improved CV conditioning   PATIENT EDUCATION: Education details: added to HEP Person educated: Patient Education method: Explanation Education comprehension: verbalized understanding  HOME EXERCISE PROGRAM: Access Code: [email or web address] URL: [email or web address] Date: 09/25/2023 Prepared by: Maryruth Eve  Exercises - Sit to Stand Without Arm Support  - 1 x daily - 7 x weekly - 2-3 sets - 5-8 reps - Seated Heel Toe Raises  - 1 x daily - 7 x weekly - 3 sets - 10 reps - Bird Dog on Counter  - 1 x daily - 7 x weekly - 3 sets - 5-10 reps - Side Stepping with Resistance at Ankles and Counter Support  - 1 x daily - 4 x weekly - 3 sets - Forward and Backward Monster Walk with Resistance at Ankles and Counter Support  - 1 x daily - 4 x weekly - 3 sets - 10 reps - Quadruped Full Range Thoracic Rotation with Reach  - 1 x daily - 7 x weekly - 3 sets - 10 reps - Prone Press Up  - 1 x daily - 7 x weekly - 3 sets - 30s hold - Supine Piriformis Stretch with Leg Straight  - 1 x daily - 7 x weekly - 3 sets - 30s hold  GOALS: Goals reviewed with patient? Yes  SHORT TERM GOALS: Target date: same as LTG     LONG TERM GOALS: Target date: 10/19/23  Patient will be independent with final HEP for improved balance and decreased fall risk.   Baseline: to be provided  Goal status: INITIAL  2.  Patient will demonstrate mild to no sway with Condition 4 of MCTSIB to demonstrate improved balance.  Baseline: mod sway and SBA Goal status: INITIAL  3.  Patient will improve gait speed to 1.3 m/s to increase safety with community ambulation distances.  Baseline: 0.78 m/s Goal status: INITIAL  4.  Patient will improve to 850 feet to demonstrate improved aerobic capacity and endurance in order to participate more easily in daily walking tasks.   Baseline: 756 feet without AD Goal status: INITIAL    ASSESSMENT:  CLINICAL IMPRESSION: Patient seen for skilled PT session with emphasis on progressing general mobility, LB stretching and conditioning. Tolerated stretches well with noted general decreased mobility to lower thoracic/lumbar spine coupled with decreased core strength predisposing patient to increased instance of LBP. Continue POC.   OBJECTIVE IMPAIRMENTS: Abnormal gait, decreased balance,  difficulty walking, decreased strength, and decreased safety awareness.   ACTIVITY LIMITATIONS: carrying, lifting, bending, and stairs  PARTICIPATION LIMITATIONS: driving and community activity  PERSONAL FACTORS: Age, Sex, Time since onset of injury/illness/exacerbation, and 1 comorbidity: RRMS  are also affecting patient's functional outcome.   REHAB POTENTIAL: Good  CLINICAL DECISION MAKING: Stable/uncomplicated  EVALUATION COMPLEXITY: Low  PLAN:  PT FREQUENCY: 2x/week  PT DURATION: 4 weeks  PLANNED INTERVENTIONS: 97164- PT Re-evaluation, 97110-Therapeutic exercises, 97530- Therapeutic activity, 97112- Neuromuscular re-education, 97535- Self Care, 86578- Manual therapy, [phone]- Gait training, [phone]- Orthotic Fit/training, [phone]- Canalith repositioning, Patient/Family education, Balance training, Stair training, Vestibular training, Visual/preceptual remediation/compensation, and DME instructions  PLAN FOR NEXT SESSION: work on eccentric lower tasks, work on step overs or modified SLS tasks, higher level balance, functional strength with dead lift/hip hinge + add to HEP, did patient look into getting cooling vest for warmer weather, assess vitals as  BP low last session, check goals/dc?    Westley Foots, PT, DPT, CBIS  10/16/2023, 3:30 PM

## 2023-10-18 ENCOUNTER — Ambulatory Visit: Payer: BC Managed Care – PPO

## 2023-10-18 VITALS — BP 108/79 | HR 76

## 2023-10-18 DIAGNOSIS — R279 Unspecified lack of coordination: Secondary | ICD-10-CM | POA: Diagnosis not present

## 2023-10-18 DIAGNOSIS — R2681 Unsteadiness on feet: Secondary | ICD-10-CM

## 2023-10-18 DIAGNOSIS — M6281 Muscle weakness (generalized): Secondary | ICD-10-CM | POA: Diagnosis not present

## 2023-10-18 NOTE — Therapy (Signed)
OUTPATIENT PHYSICAL THERAPY NEURO TREATMENT/ DISCHARGE SUMMARY   Patient Name: Virginia [name] MRN: [id number] DOB:21-Sep-1981, 42 y.o., female Today's Date: 10/18/2023   PCP: [name], MD  REFERRING PROVIDER: Shawnie Dapper, NP  PHYSICAL THERAPY DISCHARGE SUMMARY  Visits from Start of Care: 7  Current functional level related to goals / functional outcomes: See below   Remaining deficits: See below   Education / Equipment: PT POC, HEP   Patient agrees to discharge. Patient goals were partially met. Patient is being discharged due to meeting the stated rehab goals.  END OF SESSION:  PT End of Session - 10/18/23 1451     Visit Number 7    Number of Visits 9    Date for PT Re-Evaluation 10/19/23    Authorization Type [employer]    PT Start Time 1445    PT Stop Time 1508    PT Time Calculation (min) 23 min    Activity Tolerance Patient tolerated treatment well    Behavior During Therapy WFL for tasks assessed/performed              Past Medical History:  Diagnosis Date   Arthritis    Chronic migraine w/o aura w/o status migrainosus, not intractable    followed by [name]   GAD (generalized anxiety disorder)    GERD (gastroesophageal reflux disease)    Hemorrhoids    History of cervical dysplasia    followed by GYN--- [name]. h[place];    hx CIN 2  s/p  colposcopy in office 05/ 2022 and CIN 3  s/p LEEP 11/ 2022   History of syncope 02/2016   admission in epic;  in setting of vertigo and dx w/ MS   Insomnia    Multiple sclerosis, relapsing-remitting (HCC) 02/2016   neurologist--- [name];  dx 05/ 2017 lumbar puncture,  dyesthesia, fatigue, vertigo   PAC (premature atrial contraction)    PSVT (paroxysmal supraventricular tachycardia) (HCC) 2019   (02-01-2023 pt stated followed by pcp) // cardiologist--- dr g. taylor;  (lov in epic 06-21-2018)  evaluated 2019 after dx MS in 2017 with syncope/ palpitations;   event monitor showed PSVT,  PACs/ PVCs    PVC (premature ventricular contraction)    Sleep apnea, unspecified    study in epic 08-07-2018  borderline sleep apnea,  recommendation loss wt, sleep hygiene, mouth guard   Past Surgical History:  Procedure Laterality Date   COLONOSCOPY WITH ESOPHAGOGASTRODUODENOSCOPY (EGD)  11/15/2022   [name];   EVALUATION UNDER ANESTHESIA WITH HEMORRHOIDECTOMY N/A 02/08/2023   Procedure: ANORECTAL EXAM UNDER ANESTHESIA;  Surgeon: [name], MD;  Location: [facility] [place];  Service: General;  Laterality: N/A;   HEMORRHOID SURGERY  02/08/2023   Procedure: EXTERNAL HEMORRHOIDECTOMY X2;  Surgeon: [name], MD;  Location: [facility];  Service: General;;   LEEP N/A 09/20/2021   Procedure: LOOP ELECTROSURGICAL EXCISION PROCEDURE (LEEP);  Surgeon: [name], MD;  Location: [facility];  Service: Gynecology;  Laterality: N/A;   Patient Active Problem List   Diagnosis Date Noted   Body mass index (BMI) 34.0-34.9, adult 09/05/2023   Leiomyoma 08/14/2023   Family history of breast cancer 07/25/2022   Chronic migraine w/o aura w/o status migrainosus, not intractable 09/01/2021   Dysplasia of cervix, high grade CIN 2 03/18/2021   LGSIL on Pap smear of cervix 02/28/2021   Vertigo of central origin 06/24/2020   Numbness 06/24/2020   Panic disorder 11/11/2019   Current  mild episode of major depressive disorder without prior episode (HCC) 11/11/2019   Snoring 06/20/2018   Excessive daytime sleepiness 06/20/2018   Neck pain 01/01/2018   Palpitations 01/01/2018   Myalgia 09/03/2017   Chronic low back pain without sciatica 09/03/2017   Right leg pain 07/26/2017   Bilateral sciatica 07/12/2017   Nystagmus 02/15/2017   Spell of altered consciousness 07/19/2016   Ataxic gait 05/31/2016   Other fatigue 05/31/2016   Dysesthesia 05/31/2016   High risk medication use 03/31/2016   Insomnia 03/31/2016   Dizziness    Syncope    Multiple sclerosis (HCC)  03/21/2016   Relapsing remitting multiple sclerosis (HCC)    Vertigo    White matter abnormality on MRI of brain 03/20/2016   Faintness     ONSET DATE: Referral  09/11/23  REFERRING DIAG: Diagnosis G35 (ICD-10-CM) - Relapsing remitting multiple sclerosis (HCC) W19.Lorne Skeens (ICD-10-CM) - Fall, initial encounter  THERAPY DIAG:  Unsteadiness on feet  Unspecified lack of coordination  Muscle weakness (generalized)  Rationale for Evaluation and Treatment: Rehabilitation  SUBJECTIVE:                                                                                                                                                                                             SUBJECTIVE STATEMENT: Patient reports doing well, back is feeling better. Denies falls. Agreeable to dc.   Pt accompanied by: self  PERTINENT HISTORY: RRMS, anxiety, arthritis  PAIN:  Are you having pain? Yes: NPRS scale: 7/10 Pain location: R low back   PRECAUTIONS: Fall  PATIENT GOALS: "strengthening up my legs"   OBJECTIVE:  Note: Objective measures were completed at Evaluation unless otherwise noted.  DIAGNOSTIC FINDINGS: Brain MRI 04/09/22 IMPRESSION: This MRI of the brain without contrast shows the following: 1.   T2/FLAIR hyperintense foci in the cerebral hemispheres and the left medulla in a pattern consistent with chronic demyelinating plaque associated with multiple sclerosis.  None of the foci appear to be acute.  Compared to the MRI dated 01/20/2020, there are no new lesions. 2.   No acute findings.  TODAY'S TREATMENT:  Vitals:   10/18/23 1454  BP: 108/79  Pulse: 76     [facility] PT Assessment - 10/18/23 0001       6 minute walk test results    Aerobic Endurance Distance Walked 858      Standardized Balance Assessment   Standardized Balance Assessment 10 meter walk test     10 Meter Walk .13m/s            MCTSIB condition 4: 30s mild sway   PATIENT EDUCATION: Education details: added to HEP Person educated: Patient Education method: Explanation Education comprehension: verbalized understanding  HOME EXERCISE PROGRAM: Access Code: [email or web address] URL: [email or web address] Date: 09/25/2023 Prepared by: Maryruth Eve  Exercises - Sit to Stand Without Arm Support  - 1 x daily - 7 x weekly - 2-3 sets - 5-8 reps - Seated Heel Toe Raises  - 1 x daily - 7 x weekly - 3 sets - 10 reps - Bird Dog on Counter  - 1 x daily - 7 x weekly - 3 sets - 5-10 reps - Side Stepping with Resistance at Ankles and Counter Support  - 1 x daily - 4 x weekly - 3 sets - Forward and Backward Monster Walk with Resistance at Ankles and Counter Support  - 1 x daily - 4 x weekly - 3 sets - 10 reps - Quadruped Full Range Thoracic Rotation with Reach  - 1 x daily - 7 x weekly - 3 sets - 10 reps - Prone Press Up  - 1 x daily - 7 x weekly - 3 sets - 30s hold - Supine Piriformis Stretch with Leg Straight  - 1 x daily - 7 x weekly - 3 sets - 30s hold  GOALS: Goals reviewed with patient? Yes  SHORT TERM GOALS: Target date: same as LTG     LONG TERM GOALS: Target date: 10/19/23  Patient will be independent with final HEP for improved balance and decreased fall risk.   Baseline: to be provided; provided Goal status: MET  2.  Patient will demonstrate mild to no sway with Condition 4 of MCTSIB to demonstrate improved balance.  Baseline: mod sway and SBA; Mild sway 30s Goal status: MET  3.  Patient will improve gait speed to 1.3 m/s to increase safety with community ambulation distances.  Baseline: 0.78 m/s; .32m/s Goal status: NOT MET  4.  Patient will improve to 850 feet to demonstrate improved aerobic capacity and endurance in order to participate more easily in daily walking tasks.   Baseline: 756 feet without AD; 856ft no AD Goal status:  MET    ASSESSMENT:  CLINICAL IMPRESSION: Patient seen for skilled PT session with emphasis on goal assessment and dc. She met 3/4 LTG. 6 Min Walk Test:  Instructed patient to ambulate as quickly and as safely as possible for 6 minutes using LRAD. Patient was allowed to take standing rest breaks without stopping the test, but if the patient required a sitting rest break the clock would be stopped and the test would be over.  Results: 858 feet. Results indicate that the patient has reduced endurance with ambulation compared to age matched norms.  Age Matched Norms: 29-69 yo M: 78 F: 62, 74-79 yo M: 76 F: 471, 33-89 yo M: 417 F: 392 MDC: 58.21 meters (190.98 feet) or 50 meters (ANPTA Core Set of Outcome Measures for Adults with Neurologic Conditions, 2018). 10 Meter Walk Test: Patient instructed to walk 10 meters (32.8 ft) as quickly and as safely as  possible at their normal speed x2 and at a fast speed x2. Time measured from 2 meter mark to 8 meter mark to accommodate ramp-up and ramp-down.  Normal speed: .5m/s Cut off scores: <0.4 m/s = household Ambulator, 0.4-0.8 m/s = limited community Ambulator, >0.8 m/s = community Ambulator, >1.2 m/s = crossing a street, <1.0 = increased fall risk MCID 0.05 m/s (small), 0.13 m/s (moderate), 0.06 m/s (significant)  (ANPTA Core Set of Outcome Measures for Adults with Neurologic Conditions, 2018). Patient to dc from PT at this time and would benefit from f/u in ~6 months to check in given nature of dx.     OBJECTIVE IMPAIRMENTS: Abnormal gait, decreased balance, difficulty walking, decreased strength, and decreased safety awareness.   ACTIVITY LIMITATIONS: carrying, lifting, bending, and stairs  PARTICIPATION LIMITATIONS: driving and community activity  PERSONAL FACTORS: Age, Sex, Time since onset of injury/illness/exacerbation, and 1 comorbidity: RRMS  are also affecting patient's functional outcome.   REHAB POTENTIAL: Good  CLINICAL DECISION  MAKING: Stable/uncomplicated  EVALUATION COMPLEXITY: Low  PLAN:  PT FREQUENCY: 2x/week  PT DURATION: 4 weeks  PLANNED INTERVENTIONS: 97164- PT Re-evaluation, 97110-Therapeutic exercises, 97530- Therapeutic activity, 97112- Neuromuscular re-education, 97535- Self Care, 09811- Manual therapy, [phone]- Gait training, [phone]- Orthotic Fit/training, [phone]- Canalith repositioning, Patient/Family education, Balance training, Stair training, Vestibular training, Visual/preceptual remediation/compensation, and DME instructions  PLAN FOR NEXT SESSION: dc from PT    Westley Foots, PT, DPT, CBIS  10/18/2023, 3:14 PM

## 2023-10-19 ENCOUNTER — Telehealth (HOSPITAL_BASED_OUTPATIENT_CLINIC_OR_DEPARTMENT_OTHER): Payer: BC Managed Care – PPO | Admitting: Family

## 2023-10-19 DIAGNOSIS — G479 Sleep disorder, unspecified: Secondary | ICD-10-CM

## 2023-10-19 NOTE — Progress Notes (Unsigned)
Virtual Visit via Telephone Note  I connected with Virginia [name] on 10/19/23 at 12:30 PM EST by telephone and verified that I am speaking with the correct person using two identifiers.  Location: Patient: Home Provider: Office   I discussed the limitations, risks, security and privacy concerns of performing an evaluation and management service by telephone and the availability of in person appointments. I also discussed with the patient that there may be a patient responsible charge related to this service. The patient expressed understanding and agreed to proceed.    I discussed the assessment and treatment plan with the patient. The patient was provided an opportunity to ask questions and all were answered. The patient agreed with the plan and demonstrated an understanding of the instructions.   The patient was advised to call back or seek an in-person evaluation if the symptoms worsen or if the condition fails to improve as anticipated.  I provided 15 minutes of non-face-to-face time during this encounter.   [name], NP   [facility] MD/PA/NP OP Progress Note  10/20/2023 7:16 AM Jazmeen Steinbrunner  MRN:  [id number]  Chief Complaint:  Sleep disturbance   HPI: Virginia [name] evaluated telephonically.  Patient presented to follow-up with medication management appointment.  Patient has failed multiple medications for sleep recently initiated Remeron 7.5 to 15 mg nightly however patient states she is unable to tolerate medications at this time.  Initiated Seroquel 25 to 50 mg nightly.  States she has been taking Seroquel as directed with symptom relief.  Denied concerns related to lingering hangover, oversedation or excessive tiredness.  Reported getting 7 to 8 hours of sleep rest on last night.  Will continue medications as indicated.  Support, encouragement and  reassurance was provided.   Of note, patient's documented failed medications with trazodone, amitriptyline, Ambien, Klonopin and  Remeron.    Sleep disturbance:  Continue Seroquel 25 mg-50 mg nightly p.o. as needed Follow-up 3 months. Patient to continue medications as indicated.  Will make 50 mg tablets available with refills.  Visit Diagnosis:    ICD-10-CM   1. Sleep disturbance  G47.9       Past Psychiatric History:   Past Medical History:  Past Medical History:  Diagnosis Date   Arthritis    Chronic migraine w/o aura w/o status migrainosus, not intractable    followed by [name]   GAD (generalized anxiety disorder)    GERD (gastroesophageal reflux disease)    Hemorrhoids    History of cervical dysplasia    followed by GYN--- [name]. harris;    hx CIN 2  s/p  colposcopy in office 05/ 2022 and CIN 3  s/p LEEP 11/ 2022   History of syncope 02/2016   admission in epic;  in setting of vertigo and dx w/ MS   Insomnia    Multiple sclerosis, relapsing-remitting (HCC) 02/2016   neurologist--- [name];  dx 05/ 2017 lumbar puncture,  dyesthesia, fatigue, vertigo   PAC (premature atrial contraction)    PSVT (paroxysmal supraventricular tachycardia) (HCC) 2019   (02-01-2023 pt stated followed by pcp) // cardiologist--- dr g. taylor;  (lov in epic 06-21-2018)  evaluated 2019 after dx MS in 2017 with syncope/ palpitations;   event monitor showed PSVT,  PACs/ PVCs   PVC (premature ventricular contraction)    Sleep apnea, unspecified    study in epic 08-07-2018  borderline sleep apnea,  recommendation loss wt, sleep hygiene, mouth guard    Past Surgical History:  Procedure Laterality Date  COLONOSCOPY WITH ESOPHAGOGASTRODUODENOSCOPY (EGD)  11/15/2022   [name];   EVALUATION UNDER ANESTHESIA WITH HEMORRHOIDECTOMY N/A 02/08/2023   Procedure: ANORECTAL EXAM UNDER ANESTHESIA;  Surgeon: [name], MD;  Location: [facility];  Service: General;  Laterality: N/A;   HEMORRHOID SURGERY  02/08/2023   Procedure: EXTERNAL HEMORRHOIDECTOMY X2;  Surgeon: [name], MD;  Location[facility]  Service: General;;   LEEP N/A 09/20/2021   Procedure: LOOP ELECTROSURGICAL EXCISION PROCEDURE (LEEP);  Surgeon: [name], MD;  Location: [facility];  Service: Gynecology;  Laterality: N/A;    Family Psychiatric History:   Family History:  Family History  Problem Relation Age of Onset   Emphysema Mother    Osteoarthritis Mother    Anxiety disorder Mother    Uterine cancer Mother 63   Other Father    Suicidality Father    Breast cancer Maternal Aunt    Breast cancer Maternal Aunt    Breast cancer Maternal Aunt    ALS Maternal Grandmother    Colon cancer Neg Hx    Colon polyps Neg Hx    Esophageal cancer Neg Hx    Rectal cancer Neg Hx    Stomach cancer Neg Hx     Social History:  Social History   Socioeconomic History   Marital status: Married    Spouse name: Not on file   Number of children: Not on file   Years of education: Not on file   Highest education level: 12th grade  Occupational History   Not on file  Tobacco Use   Smoking status: Former    Current packs/day: 0.00    Average packs/day: 0.5 packs/day for 10.0 years (5.0 ttl pk-yrs)    Types: Cigarettes    Start date: 2005    Quit date: 2015    Years since quitting: 9.9   Smokeless tobacco: Never  Vaping Use   Vaping status: Every Day   Substances: Nicotine, Flavoring, Nicotine-salt   Devices: unsure name  Substance and Sexual Activity   Alcohol use: Yes    Comment: rarely   Drug use: Never   Sexual activity: Not Currently    Birth control/protection: Pill  Other Topics Concern   Not on file  Social History Narrative   Not on file   Social Drivers of Health   Financial Resource Strain: Medium Risk (08/17/2023)   Overall Financial Resource Strain (CARDIA)    Difficulty of Paying Living Expenses: Somewhat hard  Food Insecurity: Food Insecurity Present (08/17/2023)   Hunger Vital Sign    Worried About Running Out of Food in the Last Year: Sometimes true    Ran Out of  Food in the Last Year: Often true  Transportation Needs: No Transportation Needs (08/17/2023)   PRAPARE - Administrator, Civil Service (Medical): No    Lack of Transportation (Non-Medical): No  Physical Activity: Insufficiently Active (08/17/2023)   Exercise Vital Sign    Days of Exercise per Week: 3 days    Minutes of Exercise per Session: 10 min  Stress: Stress Concern Present (08/17/2023)   [employer] of Occupational Health - Occupational Stress Questionnaire    Feeling of Stress : Very much  Social Connections: Socially Isolated (08/17/2023)   Social Connection and Isolation Panel [NHANES]    Frequency of Communication with Friends and Family: More than three times a week    Frequency of Social Gatherings with Friends and Family: Never    Attends Religious  Services: Never    Active Member of Clubs or Organizations: No    Attends Engineer, structural: Not on file    Marital Status: Separated    Allergies:  Allergies  Allergen Reactions   Codeine Other (See Comments)    Hallucinations    Onion Swelling   Topamax [Topiramate]     Was talking out of her head, walked into walls    Metabolic Disorder Labs: Lab Results  Component Value Date   HGBA1C 5.3 07/25/2022   No results found for: "PROLACTIN" Lab Results  Component Value Date   CHOL 109 07/25/2022   TRIG 76 07/25/2022   HDL 35 (L) 07/25/2022   CHOLHDL 3.1 07/25/2022   LDLCALC 58 07/25/2022   Lab Results  Component Value Date   TSH 1.280 07/25/2022   TSH 1.010 01/01/2018    Therapeutic Level Labs: No results found for: "LITHIUM" No results found for: "VALPROATE" No results found for: "CBMZ"  Current Medications: Current Outpatient Medications  Medication Sig Dispense Refill   atenolol (TENORMIN) 50 MG tablet Take 1 tablet (50 mg total) by mouth daily. 90 tablet 0   baclofen (LIORESAL) 10 MG tablet TAKE 2 TABLETS BY MOUTH 3 TIMES A DAY FOR SPASTICITY 540 tablet 0    clonazePAM (KLONOPIN) 1 MG tablet Take 1 tablet (1 mg total) by mouth 2 (two) times daily as needed for anxiety. 30 tablet 3   drospirenone-ethinyl estradiol (YAZ) 3-0.02 MG tablet Take 1 tablet by mouth daily. 84 tablet 0   DULoxetine (CYMBALTA) 60 MG capsule TAKE 1 CAPSULE BY MOUTH EVERY DAY 90 capsule 0   etodolac (LODINE) 400 MG tablet Take 1 tablet (400 mg total) by mouth 2 (two) times daily. 180 tablet 1   gabapentin (NEURONTIN) 600 MG tablet Take 1 tablet (600 mg total) by mouth 4 (four) times daily. 360 tablet 1   KESIMPTA 20 MG/0.4ML SOAJ Inject 0.4 mLs into the skin every 30 (thirty) days. 1.2 mL 2   meclizine (ANTIVERT) 25 MG tablet Take 1 tablet (25 mg total) by mouth 3 (three) times daily as needed for dizziness. 30 tablet 11   pantoprazole (PROTONIX) 40 MG tablet Take 1 tablet (40 mg total) by mouth 2 (two) times daily. 180 tablet 0   QUEtiapine (SEROQUEL) 25 MG tablet Take 1 tablet (25 mg total) by mouth at bedtime. 30 tablet 30   Semaglutide-Weight Management (WEGOVY) 0.5 MG/0.5ML SOAJ Inject 0.5 mg into the skin once a week. 2 mL 1   SUMAtriptan (IMITREX) 50 MG tablet Take 1 tablet (50 mg total) by mouth daily as needed for migraine. May repeat in 2 hours if headache persists or recurs. 10 tablet 11   No current facility-administered medications for this visit.     Musculoskeletal:   Psychiatric Specialty Exam: Review of Systems  Constitutional: Negative.   Psychiatric/Behavioral:  Sleep disturbance: improving.   All other systems reviewed and are negative.   There were no vitals taken for this visit.There is no height or weight on file to calculate BMI.  General Appearance: NA  Eye Contact:  NA  Speech:  Clear and Coherent  Volume:  Normal  Mood:  Anxious and Depressed  Affect:  Congruent  Thought Process:  Coherent  Orientation:  Full (Time, Place, and Person)  Thought Content: Logical   Suicidal Thoughts:  No  Homicidal Thoughts:  No  Memory:  Immediate;    Good Recent;   Good  Judgement:  Good  Insight:  Good  Psychomotor Activity:  Normal  Concentration:  Concentration: Good  Recall:  Good  Fund of Knowledge: Good  Language: Good  Akathisia:  No  Handed:  Right  AIMS (if indicated): not done  Assets:  Communication Skills Desire for Improvement Resilience  ADL's:  Intact  Cognition: WNL  Sleep:   improving reported 8 hours with medications   Screenings: PHQ2-9    Flowsheet Row Office Visit from [facility]/20[facility] in [facility] Office Visit from 08/21/2023 in [facility] Office Visit from 07/21/2022 in [facility] Office Visit from 07/04/2022 in [facility] Office Visit from 07/15/2018 in [facility]  PHQ-2 Total Score 2 2 4 1  0  PHQ-9 Total Score 5 7 19  -- --      Flowsheet Row Admission (Discharged) from 02/08/2023 in [facility] ED from 09/27/2021 in [facility] Admission (Discharged) from 09/20/2021 in [facility]  C-SSRS RISK CATEGORY No Risk No Risk Error: Question 2 not populated        Assessment and Plan: Maleeka Fichtner 42 year old Caucasian female presents for medication management appointment.  Patient has tried and failed multiple medications in the past.  Recently started on Seroquel 25 to 50 mg p.o. as needed nightly.  She reports she has been taking medications and tolerating them well.  Denying any medication side effects.  States she does have to take 50 mg in order to get restful sleep.  States last night she was able to get 8 hours of sleep.  Denied concerns with lingering hangover or feeling oversedated.  Patient to continue medications as indicated.  Will make 50 mg tablets available with refills.  Sleep disturbance:  Continue Seroquel 25 mg-50 mg nightly p.o. as needed Follow-up 3 months    Collaboration of Care: Collaboration of Care: Medication Management AEB Seroquel 50 mg   Patient/Guardian was advised Release of Information must be obtained prior to any record release in order to collaborate their care with an outside provider. Patient/Guardian was advised if they have not already done so to contact the registration department to sign all necessary forms in order for [place] to release information regarding their care.   Consent: Patient/Guardian gives verbal consent for treatment and assignment of benefits for services provided during this visit. Patient/Guardian expressed understanding and agreed to proceed.    [name], NP 10/19/2023, 7:16 AM

## 2023-10-20 NOTE — Addendum Note (Signed)
Addended by: [name] on: 10/20/2023 07:17 AM   Modules accepted: Level of Service

## 2023-10-21 ENCOUNTER — Other Ambulatory Visit: Payer: Self-pay | Admitting: Family Medicine

## 2023-10-21 DIAGNOSIS — F410 Panic disorder [episodic paroxysmal anxiety] without agoraphobia: Secondary | ICD-10-CM

## 2023-10-21 DIAGNOSIS — F320 Major depressive disorder, single episode, mild: Secondary | ICD-10-CM

## 2023-10-25 ENCOUNTER — Other Ambulatory Visit: Payer: Self-pay | Admitting: Neurology

## 2023-10-27 ENCOUNTER — Other Ambulatory Visit (HOSPITAL_COMMUNITY): Payer: Self-pay | Admitting: Family

## 2023-11-06 ENCOUNTER — Other Ambulatory Visit (HOSPITAL_COMMUNITY): Payer: Self-pay | Admitting: Family

## 2023-11-10 ENCOUNTER — Other Ambulatory Visit: Payer: Self-pay | Admitting: Obstetrics and Gynecology

## 2023-11-10 DIAGNOSIS — Z3041 Encounter for surveillance of contraceptive pills: Secondary | ICD-10-CM

## 2023-11-10 DIAGNOSIS — D219 Benign neoplasm of connective and other soft tissue, unspecified: Secondary | ICD-10-CM

## 2023-11-10 DIAGNOSIS — N921 Excessive and frequent menstruation with irregular cycle: Secondary | ICD-10-CM

## 2023-11-12 ENCOUNTER — Encounter: Payer: Self-pay | Admitting: Obstetrics and Gynecology

## 2023-11-13 ENCOUNTER — Other Ambulatory Visit: Payer: Self-pay | Admitting: Obstetrics and Gynecology

## 2023-11-13 DIAGNOSIS — Z3041 Encounter for surveillance of contraceptive pills: Secondary | ICD-10-CM

## 2023-11-13 DIAGNOSIS — D219 Benign neoplasm of connective and other soft tissue, unspecified: Secondary | ICD-10-CM

## 2023-11-13 DIAGNOSIS — N921 Excessive and frequent menstruation with irregular cycle: Secondary | ICD-10-CM

## 2023-11-13 MED ORDER — DROSPIRENONE-ETHINYL ESTRADIOL 3-0.02 MG PO TABS: 1.0000 | ORAL_TABLET | Freq: Every day | ORAL | 2 refills | Status: DC

## 2023-11-13 NOTE — Progress Notes (Signed)
 Rx RF yaz. Doing well, no AUB

## 2023-11-14 ENCOUNTER — Other Ambulatory Visit: Payer: Self-pay | Admitting: Neurology

## 2023-11-15 ENCOUNTER — Encounter: Payer: Self-pay | Admitting: Neurology

## 2023-12-28 ENCOUNTER — Other Ambulatory Visit: Payer: Self-pay | Admitting: Family Medicine

## 2023-12-28 DIAGNOSIS — I10 Essential (primary) hypertension: Secondary | ICD-10-CM

## 2024-01-02 ENCOUNTER — Other Ambulatory Visit: Payer: Self-pay | Admitting: Neurology

## 2024-01-03 ENCOUNTER — Telehealth: Payer: Self-pay

## 2024-01-03 NOTE — Telephone Encounter (Signed)
 Received a call regarding the patients KESIMPTA rx. They stated that a prior auth is required for this medication.   Verbal authorization can be given at [phone] or a faxed authorization can be sent to [phone]

## 2024-01-05 ENCOUNTER — Other Ambulatory Visit (HOSPITAL_COMMUNITY): Payer: Self-pay | Admitting: Family

## 2024-01-07 ENCOUNTER — Telehealth (HOSPITAL_COMMUNITY): Payer: Self-pay

## 2024-01-07 ENCOUNTER — Other Ambulatory Visit: Payer: Self-pay | Admitting: Neurology

## 2024-01-07 ENCOUNTER — Telehealth: Payer: Self-pay

## 2024-01-07 ENCOUNTER — Encounter: Payer: Self-pay | Admitting: Neurology

## 2024-01-07 ENCOUNTER — Other Ambulatory Visit (HOSPITAL_COMMUNITY): Payer: Self-pay

## 2024-01-07 MED ORDER — TIZANIDINE HCL 4 MG PO TABS: ORAL_TABLET | ORAL | 1 refills | Status: DC

## 2024-01-07 NOTE — Telephone Encounter (Signed)
 There should be an authorization on file that is good until 01-23-2024. We can start a pa request for renewal of this but patient should still be able to fill until 01-23-2024 (unless it is a too soon to refill)

## 2024-01-07 NOTE — Telephone Encounter (Signed)
 Since PA expires 01/23/24 (in roughly a couple weeks), I would go ahead and do PA so that she won't run into any issues when she does need refill. Thank you

## 2024-01-07 NOTE — Telephone Encounter (Signed)
 Patient called and said the Quetiapine worked great for three night and is no longer working. Please review and advise, thank you

## 2024-01-07 NOTE — Telephone Encounter (Signed)
 PA request has been Received. New Encounter has been or will be created for follow up. For additional info see Pharmacy Prior Auth telephone encounter from 01/07/2024.

## 2024-01-07 NOTE — Telephone Encounter (Cosign Needed)
 Spoke to patient regarding medication management follow-up appointment.  She reports she does not feel that Seroquel 25 milligrams to start working.  patient scheduled to follow-up 3 months.

## 2024-01-07 NOTE — Telephone Encounter (Signed)
 Pharmacy Patient Advocate Encounter   Received notification from Pt Calls Messages that prior authorization for Kesimpta 20MG /0.4ML auto-injectors is required/requested.   Insurance verification completed.   The patient is insured through [employer] .   Per test claim: PA required; PA started via CoverMyMeds. KEY [id number] . Waiting for clinical questions to populate.

## 2024-01-08 NOTE — Telephone Encounter (Signed)
 Pharmacy Patient Advocate Encounter   Received notification from Pt Calls Messages that prior authorization for Kesimpta 20MG /0.4ML auto-injectors is required/requested.   Insurance verification completed.   The patient is insured through [employer] [facility] .   Prior Authorization for Kesimpta 20MG /0.4ML auto-injectors has been APPROVED from 01-08-2024 to 01-07-2025   PA #/Case ID/Reference #: [id number]

## 2024-01-14 ENCOUNTER — Other Ambulatory Visit: Payer: Self-pay | Admitting: Neurology

## 2024-01-14 DIAGNOSIS — G35 Multiple sclerosis: Secondary | ICD-10-CM

## 2024-01-21 ENCOUNTER — Telehealth (HOSPITAL_BASED_OUTPATIENT_CLINIC_OR_DEPARTMENT_OTHER): Admitting: Family

## 2024-01-21 DIAGNOSIS — G479 Sleep disorder, unspecified: Secondary | ICD-10-CM | POA: Diagnosis not present

## 2024-01-21 NOTE — Progress Notes (Addendum)
 Virtual Visit via Telephone Note  I connected with Virginia [name] on 01/21/24 at  4:00 PM EDT by telephone and verified that I am speaking with the correct person using two identifiers.  Location: Patient: Home Provider: Office   I discussed the limitations, risks, security and privacy concerns of performing an evaluation and management service by telephone and the availability of in person appointments. I also discussed with the patient that there may be a patient responsible charge related to this service. The patient expressed understanding and agreed to proceed.   I discussed the assessment and treatment plan with the patient. The patient was provided an opportunity to ask questions and all were answered. The patient agreed with the plan and demonstrated an understanding of the instructions.   The patient was advised to call back or seek an in-person evaluation if the symptoms worsen or if the condition fails to improve as anticipated.  I provided 20 minutes of non-face-to-face time during this encounter.   [name], NP   [facility] MD/PA/NP OP Progress Note  01/21/2024 3:56 PM Amorette Charrette  MRN:  [id number]  Chief Complaint: Medication follow-up appointment  HPI: Virginia [name] 43 year old female presents for medication management follow-up appointment.  She reports " I am not sleeping" states she has been taking Seroquel 50 mg nightly which was helping her stay asleep however feels that the medication is stopped working.  Discussed titrating 75 mg to 100 mg nightly.  Patient was receptive to plan.  Encouraged to follow-up 1 week medication adherence/tolerability.  Discussed initiating Rozerem and/or doxepin if unable to get adequate sleep on titrated dose.  Denied anything change in her current routine.  Consideration for sleep study.  Suicidal or homicidal ideations.  Denies auditory visual hallucinations.  Per previous note patient has tried trazodone, Ambien, Klonopin, Remeron  and amitriptyline.  At follow-up appointment we will discuss GeneSight testing.  Patient will need an office visit for metabolic workup, i.e. EKG, CBC CMP and thyroid panel. support, encouragement and reassurance was provided.  Visit Diagnosis:    ICD-10-CM   1. Sleep disturbance  G47.9       Past Psychiatric History:   Past Medical History:  Past Medical History:  Diagnosis Date   Arthritis    Chronic migraine w/o aura w/o status migrainosus, not intractable    followed by [name]   GAD (generalized anxiety disorder)    GERD (gastroesophageal reflux disease)    Hemorrhoids    History of cervical dysplasia    followed by GYN--- [name]. harris;    hx CIN 2  s/p  colposcopy in office 05/ 2022 and CIN 3  s/p LEEP 11/ 2022   History of syncope 02/2016   admission in epic;  in setting of vertigo and dx w/ MS   Insomnia    Multiple sclerosis, relapsing-remitting (HCC) 02/2016   neurologist--- [name];  dx 05/ 2017 lumbar puncture,  dyesthesia, fatigue, vertigo   PAC (premature atrial contraction)    PSVT (paroxysmal supraventricular tachycardia) (HCC) 2019   (02-01-2023 pt stated followed by pcp) // cardiologist--- dr g. taylor;  (lov in epic 06-21-2018)  evaluated 2019 after dx MS in 2017 with syncope/ palpitations;   event monitor showed PSVT,  PACs/ PVCs   PVC (premature ventricular contraction)    Sleep apnea, unspecified    study in epic 08-07-2018  borderline sleep apnea,  recommendation loss wt, sleep hygiene, mouth guard    Past Surgical History:  Procedure Laterality Date  COLONOSCOPY WITH ESOPHAGOGASTRODUODENOSCOPY (EGD)  11/15/2022   [name];   EVALUATION UNDER ANESTHESIA WITH HEMORRHOIDECTOMY N/A 02/08/2023   Procedure: ANORECTAL EXAM UNDER ANESTHESIA;  Surgeon: [name], MD;  Location: [facility];  Service: General;  Laterality: N/A;   HEMORRHOID SURGERY  02/08/2023   Procedure: EXTERNAL HEMORRHOIDECTOMY X2;  Surgeon: [name],  MD;  Location: [facility];  Service: General;;   LEEP N/A 09/20/2021   Procedure: LOOP ELECTROSURGICAL EXCISION PROCEDURE (LEEP);  Surgeon: [name], MD;  Location: [facility];  Service: Gynecology;  Laterality: N/A;    Family Psychiatric History:   Family History:  Family History  Problem Relation Age of Onset   Emphysema Mother    Osteoarthritis Mother    Anxiety disorder Mother    Uterine cancer Mother 2   Other Father    Suicidality Father    Breast cancer Maternal Aunt    Breast cancer Maternal Aunt    Breast cancer Maternal Aunt    ALS Maternal Grandmother    Colon cancer Neg Hx    Colon polyps Neg Hx    Esophageal cancer Neg Hx    Rectal cancer Neg Hx    Stomach cancer Neg Hx     Social History:  Social History   Socioeconomic History   Marital status: Married    Spouse name: Not on file   Number of children: Not on file   Years of education: Not on file   Highest education level: 12th grade  Occupational History   Not on file  Tobacco Use   Smoking status: Former    Current packs/day: 0.00    Average packs/day: 0.5 packs/day for 10.0 years (5.0 ttl pk-yrs)    Types: Cigarettes    Start date: 2005    Quit date: 2015    Years since quitting: 10.2   Smokeless tobacco: Never  Vaping Use   Vaping status: Every Day   Substances: Nicotine, Flavoring, Nicotine-salt   Devices: unsure name  Substance and Sexual Activity   Alcohol use: Yes    Comment: rarely   Drug use: Never   Sexual activity: Not Currently    Birth control/protection: Pill  Other Topics Concern   Not on file  Social History Narrative   Not on file   Social Drivers of Health   Financial Resource Strain: Medium Risk (08/17/2023)   Overall Financial Resource Strain (CARDIA)    Difficulty of Paying Living Expenses: Somewhat hard  Food Insecurity: Food Insecurity Present (08/17/2023)   Hunger Vital Sign    Worried About Running Out of Food in the Last Year:  Sometimes true    Ran Out of Food in the Last Year: Often true  Transportation Needs: No Transportation Needs (08/17/2023)   PRAPARE - Administrator, Civil Service (Medical): No    Lack of Transportation (Non-Medical): No  Physical Activity: Insufficiently Active (08/17/2023)   Exercise Vital Sign    Days of Exercise per Week: 3 days    Minutes of Exercise per Session: 10 min  Stress: Stress Concern Present (08/17/2023)   [employer] of Occupational Health - Occupational Stress Questionnaire    Feeling of Stress : Very much  Social Connections: Socially Isolated (08/17/2023)   Social Connection and Isolation Panel [NHANES]    Frequency of Communication with Friends and Family: More than three times a week    Frequency of Social Gatherings with Friends and Family: Never    Attends Religious  Services: Never    Active Member of Clubs or Organizations: No    Attends Engineer, structural: Not on file    Marital Status: Separated    Allergies:  Allergies  Allergen Reactions   Codeine Other (See Comments)    Hallucinations    Onion Swelling   Topamax [Topiramate]     Was talking out of her head, walked into walls    Metabolic Disorder Labs: Lab Results  Component Value Date   HGBA1C 5.3 07/25/2022   No results found for: "PROLACTIN" Lab Results  Component Value Date   CHOL 109 07/25/2022   TRIG 76 07/25/2022   HDL 35 (L) 07/25/2022   CHOLHDL 3.1 07/25/2022   LDLCALC 58 07/25/2022   Lab Results  Component Value Date   TSH 1.280 07/25/2022   TSH 1.010 01/01/2018    Therapeutic Level Labs: No results found for: "LITHIUM" No results found for: "VALPROATE" No results found for: "CBMZ"  Current Medications: Current Outpatient Medications  Medication Sig Dispense Refill   atenolol (TENORMIN) 50 MG tablet TAKE 1 TABLET BY MOUTH EVERY DAY 90 tablet 0   baclofen (LIORESAL) 10 MG tablet TAKE 2 TABLETS BY MOUTH 3 TIMES A DAY FOR SPASTICITY 540  tablet 0   clonazePAM (KLONOPIN) 1 MG tablet Take 1 tablet (1 mg total) by mouth 2 (two) times daily as needed for anxiety. 30 tablet 3   drospirenone-ethinyl estradiol (YAZ) 3-0.02 MG tablet Take 1 tablet by mouth daily. 84 tablet 2   DULoxetine (CYMBALTA) 60 MG capsule TAKE 1 CAPSULE BY MOUTH EVERY DAY 90 capsule 0   etodolac (LODINE) 400 MG tablet Take 1 tablet (400 mg total) by mouth 2 (two) times daily. 180 tablet 1   gabapentin (NEURONTIN) 600 MG tablet Take 1 tablet (600 mg total) by mouth 4 (four) times daily. 360 tablet 1   KESIMPTA 20 MG/0.4ML SOAJ INJECT 1 PEN UNDER THE SKIN EVERY MONTH 0.4 mL 5   meclizine (ANTIVERT) 25 MG tablet Take 1 tablet (25 mg total) by mouth 3 (three) times daily as needed for dizziness. 30 tablet 11   pantoprazole (PROTONIX) 40 MG tablet Take 1 tablet (40 mg total) by mouth 2 (two) times daily. 180 tablet 0   QUEtiapine (SEROQUEL) 25 MG tablet Take 1 tablet (25 mg total) by mouth at bedtime. 30 tablet 30   Semaglutide-Weight Management (WEGOVY) 0.5 MG/0.5ML SOAJ Inject 0.5 mg into the skin once a week. 2 mL 1   SUMAtriptan (IMITREX) 50 MG tablet TAKE 1 TABLET EVERY 2 HRS AS NEEDED FOR MIGRAINE. MAY REPEAT IN 2 HRS IF HEADACHE PERSISTS OR RECURS 10 tablet 1   tiZANidine (ZANAFLEX) 4 MG tablet Take 1/2 to 1 pill po tid prn 30 tablet 1   No current facility-administered medications for this visit.     Musculoskeletal: Telephonic  Psychiatric Specialty Exam: Review of Systems  There were no vitals taken for this visit.There is no height or weight on file to calculate BMI.  General Appearance: NA  Eye Contact:  NA  Speech:  Clear and Coherent  Volume:  Normal  Mood:  Anxious " I am not doing good, I can;t sleep."   Affect:  Congruent  Thought Process:  Coherent  Orientation:  Full (Time, Place, and Person)  Thought Content: Logical   Suicidal Thoughts:  No  Homicidal Thoughts:  No  Memory:  Immediate;   Fair Recent;   Fair  Judgement:  Good   Insight:  Good  Psychomotor  Activity:  NA  Concentration:  Concentration: Fair  Recall:  Good  Fund of Knowledge: Good  Language: NA  Akathisia:  NA  Handed:  Right  AIMS (if indicated): not done  Assets:  Communication Skills Desire for Improvement Social Support  ADL's:  Intact  Cognition: WNL  Sleep:  Poor reported 4 hours    Screenings: PHQ2-9    Flowsheet Row Office Visit from 10/03/2023 in [facility] Office Visit from 08/21/2023 [facility] Office Visit from 07/21/2022 in [facility] Office Visit from 07/04/2022 in [facility] Office Visit from 07/15/2018 in [facility]  PHQ-2 Total Score 2 2 4 1  0  PHQ-9 Total Score 5 7 19  -- --      Flowsheet Row Admission (Discharged) from 02/08/2023 in [facility] ED from 09/27/2021 in [facility] Admission (Discharged) from 09/20/2021 in [facility]  C-SSRS RISK CATEGORY No Risk No Risk Error: Question 2 not populated        Assessment and Plan: Jaelin Sallie 43 year old female has a virtual medication follow-up appointment.  States her camera and telephone is not working.  Spoke to patient telephonically.  She reports ongoing issues related to sleep disturbance.  Tried multiple medications to help with sleep initially she was able to get adequate rest with Seroquel 25 to 50 mg nightly.  States more recently she has only been sleeping for 4 hours a night.  Discussed titrating Seroquel 50 mg to Seroquel 75 mg to 100 mg.  No change in sleeping hygiene or daily routine.  Denied symptoms of worry.  Discussed following up 1 week for medication management/tolerability.  Support, encouragement and reassurance was provided.  Collaboration of Care: Collaboration of Care: Medication Management AEB discussed titrating Seroquel  50 mg to 75 mg nightly try Seroquel 100 mg if unable to get adequate rest  Patient/Guardian was advised Release of Information must be obtained prior to any record release in order to collaborate their care with an outside provider. Patient/Guardian was advised if they have not already done so to contact the registration department to sign all necessary forms in order for [place] to release information regarding their care.   Consent: Patient/Guardian gives verbal consent for treatment and assignment of benefits for services provided during this visit. Patient/Guardian expressed understanding and agreed to proceed.    [name], NP 01/21/2024, 3:56 PM

## 2024-01-25 ENCOUNTER — Telehealth (HOSPITAL_BASED_OUTPATIENT_CLINIC_OR_DEPARTMENT_OTHER): Admitting: Family

## 2024-01-25 DIAGNOSIS — G479 Sleep disorder, unspecified: Secondary | ICD-10-CM

## 2024-01-25 MED ORDER — DOXEPIN HCL 50 MG PO CAPS
50.0000 mg | ORAL_CAPSULE | Freq: Every day | ORAL | 0 refills | Status: DC
Start: 2024-01-25 — End: 2024-07-10

## 2024-01-25 NOTE — Progress Notes (Signed)
 Virtual Visit via Video Note  I connected with Virginia [name] on 01/25/24 at  1:30 PM EDT by a video enabled telemedicine application and verified that I am speaking with the correct person using two identifiers.  Location: Patient: Home Provider: Office    I discussed the limitations of evaluation and management by telemedicine and the availability of in person appointments. The patient expressed understanding and agreed to proceed.   I discussed the assessment and treatment plan with the patient. The patient was provided an opportunity to ask questions and all were answered. The patient agreed with the plan and demonstrated an understanding of the instructions.   The patient was advised to call back or seek an in-person evaluation if the symptoms worsen or if the condition fails to improve as anticipated.  I provided 10 minutes of non-face-to-face time during this encounter.   [name], NP   [facility] MD/PA/NP OP Progress Note  01/25/2024 1:55 PM Virginia [name]  MRN:  [id number]  Chief Complaint: "Still not sleeping"  HPI:  Virginia [name] 43 year old female presents for medication management follow-up appointment.  She was seen and evaluated via caregility.  Continues to endorse issues related to sleep disturbance.  States she is only getting 1 to 2 hours of sleep.  States she continues to have racing thoughts and mood irritability. Patient has failed multiple medication for sleep disturbance.  Patient reports 25 to 50 mg of Seroquel was working for roughly 2 to 3 weeks however states she does not get any rest at this time.  Discussed titrating to 75 to 100 mg which she reports she has been trying for the past 2 weeks without any symptom relief.  Discussed discontinuing Seroquel and initiated doxepin 50 mg nightly.  Discussed consideration for sleep study.  She was receptive to plan.  Support encouragement reassurance was provided.  Start doxepin 50 mg nightly Discontinue  Seroquel   Visit Diagnosis:    ICD-10-CM   1. Sleep disturbance  G47.9       Past Psychiatric History:   Past Medical History:  Past Medical History:  Diagnosis Date   Arthritis    Chronic migraine w/o aura w/o status migrainosus, not intractable    followed by [name]   GAD (generalized anxiety disorder)    GERD (gastroesophageal reflux disease)    Hemorrhoids    History of cervical dysplasia    followed by GYN--- [name]. harris;    hx CIN 2  s/p  colposcopy in office 05/ 2022 and CIN 3  s/p LEEP 11/ 2022   History of syncope 02/2016   admission in epic;  in setting of vertigo and dx w/ MS   Insomnia    Multiple sclerosis, relapsing-remitting (HCC) 02/2016   neurologist--- [name];  dx 05/ 2017 lumbar puncture,  dyesthesia, fatigue, vertigo   PAC (premature atrial contraction)    PSVT (paroxysmal supraventricular tachycardia) (HCC) 2019   (02-01-2023 pt stated followed by pcp) // cardiologist--- dr g. taylor;  (lov in epic 06-21-2018)  evaluated 2019 after dx MS in 2017 with syncope/ palpitations;   event monitor showed PSVT,  PACs/ PVCs   PVC (premature ventricular contraction)    Sleep apnea, unspecified    study in epic 08-07-2018  borderline sleep apnea,  recommendation loss wt, sleep hygiene, mouth guard    Past Surgical History:  Procedure Laterality Date   COLONOSCOPY WITH ESOPHAGOGASTRODUODENOSCOPY (EGD)  11/15/2022   [name];   EVALUATION UNDER ANESTHESIA WITH HEMORRHOIDECTOMY N/A 02/08/2023  Procedure: ANORECTAL EXAM UNDER ANESTHESIA;  Surgeon: [name], MD;  Location: [facility];  Service: General;  Laterality: N/A;   HEMORRHOID SURGERY  02/08/2023   Procedure: EXTERNAL HEMORRHOIDECTOMY X2;  Surgeon: [name], MD;  Location: [facility];  Service: General;;   LEEP N/A 09/20/2021   Procedure: LOOP ELECTROSURGICAL EXCISION PROCEDURE (LEEP);  Surgeon: [name], MD;  Location: [facility];  Service:  Gynecology;  Laterality: N/A;    Family Psychiatric History:   Family History:  Family History  Problem Relation Age of Onset   Emphysema Mother    Osteoarthritis Mother    Anxiety disorder Mother    Uterine cancer Mother 62   Other Father    Suicidality Father    Breast cancer Maternal Aunt    Breast cancer Maternal Aunt    Breast cancer Maternal Aunt    ALS Maternal Grandmother    Colon cancer Neg Hx    Colon polyps Neg Hx    Esophageal cancer Neg Hx    Rectal cancer Neg Hx    Stomach cancer Neg Hx     Social History:  Social History   Socioeconomic History   Marital status: Married    Spouse name: Not on file   Number of children: Not on file   Years of education: Not on file   Highest education level: 12th grade  Occupational History   Not on file  Tobacco Use   Smoking status: Former    Current packs/day: 0.00    Average packs/day: 0.5 packs/day for 10.0 years (5.0 ttl pk-yrs)    Types: Cigarettes    Start date: 2005    Quit date: 2015    Years since quitting: 10.2   Smokeless tobacco: Never  Vaping Use   Vaping status: Every Day   Substances: Nicotine, Flavoring, Nicotine-salt   Devices: unsure name  Substance and Sexual Activity   Alcohol use: Yes    Comment: rarely   Drug use: Never   Sexual activity: Not Currently    Birth control/protection: Pill  Other Topics Concern   Not on file  Social History Narrative   Not on file   Social Drivers of Health   Financial Resource Strain: Medium Risk (08/17/2023)   Overall Financial Resource Strain (CARDIA)    Difficulty of Paying Living Expenses: Somewhat hard  Food Insecurity: Food Insecurity Present (08/17/2023)   Hunger Vital Sign    Worried About Running Out of Food in the Last Year: Sometimes true    Ran Out of Food in the Last Year: Often true  Transportation Needs: No Transportation Needs (08/17/2023)   PRAPARE - Administrator, Civil Service (Medical): No    Lack of  Transportation (Non-Medical): No  Physical Activity: Insufficiently Active (08/17/2023)   Exercise Vital Sign    Days of Exercise per Week: 3 days    Minutes of Exercise per Session: 10 min  Stress: Stress Concern Present (08/17/2023)   [employer] of Occupational Health - Occupational Stress Questionnaire    Feeling of Stress : Very much  Social Connections: Socially Isolated (08/17/2023)   Social Connection and Isolation Panel [NHANES]    Frequency of Communication with Friends and Family: More than three times a week    Frequency of Social Gatherings with Friends and Family: Never    Attends Religious Services: Never    Database administrator or Organizations: No    Attends Engineer, structural: Not  on file    Marital Status: Separated    Allergies:  Allergies  Allergen Reactions   Codeine Other (See Comments)    Hallucinations    Onion Swelling   Topamax [Topiramate]     Was talking out of her head, walked into walls    Metabolic Disorder Labs: Lab Results  Component Value Date   HGBA1C 5.3 07/25/2022   No results found for: "PROLACTIN" Lab Results  Component Value Date   CHOL 109 07/25/2022   TRIG 76 07/25/2022   HDL 35 (L) 07/25/2022   CHOLHDL 3.1 07/25/2022   LDLCALC 58 07/25/2022   Lab Results  Component Value Date   TSH 1.280 07/25/2022   TSH 1.010 01/01/2018    Therapeutic Level Labs: No results found for: "LITHIUM" No results found for: "VALPROATE" No results found for: "CBMZ"  Current Medications: Current Outpatient Medications  Medication Sig Dispense Refill   doxepin (SINEQUAN) 50 MG capsule Take 1 capsule (50 mg total) by mouth at bedtime. 30 capsule 0   atenolol (TENORMIN) 50 MG tablet TAKE 1 TABLET BY MOUTH EVERY DAY 90 tablet 0   baclofen (LIORESAL) 10 MG tablet TAKE 2 TABLETS BY MOUTH 3 TIMES A DAY FOR SPASTICITY 540 tablet 0   clonazePAM (KLONOPIN) 1 MG tablet Take 1 tablet (1 mg total) by mouth 2 (two) times daily as  needed for anxiety. 30 tablet 3   drospirenone-ethinyl estradiol (YAZ) 3-0.02 MG tablet Take 1 tablet by mouth daily. 84 tablet 2   DULoxetine (CYMBALTA) 60 MG capsule TAKE 1 CAPSULE BY MOUTH EVERY DAY 90 capsule 0   etodolac (LODINE) 400 MG tablet Take 1 tablet (400 mg total) by mouth 2 (two) times daily. 180 tablet 1   gabapentin (NEURONTIN) 600 MG tablet Take 1 tablet (600 mg total) by mouth 4 (four) times daily. 360 tablet 1   KESIMPTA 20 MG/0.4ML SOAJ INJECT 1 PEN UNDER THE SKIN EVERY MONTH 0.4 mL 5   meclizine (ANTIVERT) 25 MG tablet Take 1 tablet (25 mg total) by mouth 3 (three) times daily as needed for dizziness. 30 tablet 11   pantoprazole (PROTONIX) 40 MG tablet Take 1 tablet (40 mg total) by mouth 2 (two) times daily. 180 tablet 0   Semaglutide-Weight Management (WEGOVY) 0.5 MG/0.5ML SOAJ Inject 0.5 mg into the skin once a week. 2 mL 1   SUMAtriptan (IMITREX) 50 MG tablet TAKE 1 TABLET EVERY 2 HRS AS NEEDED FOR MIGRAINE. MAY REPEAT IN 2 HRS IF HEADACHE PERSISTS OR RECURS 10 tablet 1   tiZANidine (ZANAFLEX) 4 MG tablet Take 1/2 to 1 pill po tid prn 30 tablet 1   No current facility-administered medications for this visit.     Musculoskeletal: Virtual platform  Psychiatric Specialty Exam: Review of Systems  Constitutional: Negative.   Endocrine: Negative.   Psychiatric/Behavioral:  Positive for sleep disturbance. The patient is nervous/anxious.     There were no vitals taken for this visit.There is no height or weight on file to calculate BMI.  General Appearance: Casual  Eye Contact:  Good  Speech:  Clear and Coherent  Volume:  Normal  Mood:  Depressed and Irritable  Affect:  Congruent  Thought Process:  Coherent  Orientation:  Full (Time, Place, and Person)  Thought Content: Logical   Suicidal Thoughts:  No  Homicidal Thoughts:  No  Memory:  Recent;   Good  Judgement:  Good  Insight:  Fair  Psychomotor Activity:  Normal  Concentration:  Concentration: Good   Recall:  Good  Fund of Knowledge: Good  Language: Good  Akathisia:  No  Handed:  Right  AIMS (if indicated): not done  Assets:  Communication Skills Desire for Improvement Resilience Social Support  ADL's:  Intact  Cognition: WNL  Sleep:  Poor   Screenings: PHQ2-9    Flowsheet Row Office Visit from [facility] Office Visit from 08/21/2023 in [facility] Office Visit from 07[facility]21/2022 in [facility] Office Visit from 07/04/2022 in [facility] Office Visit from 07/15/2018 in [facility]  PHQ-2 Total Score 2 2 4 1  0  PHQ-9 Total Score 5 7 19  -- --      Flowsheet Row Admission (Discharged) from 02/08/2023 in [facility] ED from 09/27/2021 in [facility] Emergency Department at [facility] Admission (Discharged) from 09/20/2021 in [facility]  C-SSRS RISK CATEGORY No Risk No Risk Error: Question 2 not populated        Assessment and Plan: Virginia [name] presents for follow-up for medication management appointment.  Multiple attempts to adjust medications that she is currently prescribed to include Seroquel 50 mg from 50 to 100 mg nightly for sleep disturbance.  She denies symptom relief.  Discussed discontinuing Seroquel.  Initiated doxepin 50 mg at this visit.  Patient to follow-up 1 week for medication adherence/tolerability.  Support, encouragement and reassurance was provided.  Collaboration of Care: Collaboration of Care: Other sleep study  Patient/Guardian was advised Release of Information must be obtained prior to any record release in order to collaborate their care with an outside provider. Patient/Guardian was advised if they have not already done so to contact the registration department to sign all necessary forms in order for [place] to release information regarding their care.    Consent: Patient/Guardian gives verbal consent for treatment and assignment of benefits for services provided during this visit. Patient/Guardian expressed understanding and agreed to proceed.    [name], NP 01/25/2024, 1:55 PM

## 2024-01-29 ENCOUNTER — Other Ambulatory Visit: Payer: Self-pay | Admitting: Family Medicine

## 2024-01-29 DIAGNOSIS — F320 Major depressive disorder, single episode, mild: Secondary | ICD-10-CM

## 2024-01-29 DIAGNOSIS — F410 Panic disorder [episodic paroxysmal anxiety] without agoraphobia: Secondary | ICD-10-CM

## 2024-01-31 NOTE — Telephone Encounter (Signed)
 Requested medication (s) are due for refill today - yes  Requested medication (s) are on the active medication list -yes  Future visit scheduled -no  Last refill: 10/22/23 #90  Notes to clinic: fails lab protocol- over 1 year-07/25/22  Requested Prescriptions  Pending Prescriptions Disp Refills   DULoxetine (CYMBALTA) 60 MG capsule [Pharmacy Med Name: DULOXETINE HCL DR 60 MG CAP] 90 capsule 0    Sig: TAKE 1 CAPSULE BY MOUTH EVERY DAY     Psychiatry: Antidepressants - SNRI - duloxetine Failed - 01/31/2024  8:38 AM      Failed - Cr in normal range and within 360 days    Creat  Date Value Ref Range Status  10/03/2021 0.91 0.50 - 0.99 mg/dL Final   Creatinine, Ser  Date Value Ref Range Status  07/25/2022 0.89 0.57 - 1.00 mg/dL Final   Creatinine, Urine  Date Value Ref Range Status  07/26/2021 43 20 - 275 mg/dL Final         Failed - eGFR is 30 or above and within 360 days    GFR, Est African American  Date Value Ref Range Status  08/24/2020 93 > OR = 60 mL/min/1.65m2 Final   GFR, Est Non African American  Date Value Ref Range Status  08/24/2020 80 > OR = 60 mL/min/1.79m2 Final   GFR, Estimated  Date Value Ref Range Status  09/27/2021 >60 >60 mL/min Final    Comment:    (NOTE) Calculated using the CKD-EPI Creatinine Equation (2021)    eGFR  Date Value Ref Range Status  07/25/2022 83 >59 mL/min/1.73 Final         Passed - Completed PHQ-2 or PHQ-9 in the last 360 days      Passed - Last BP in normal range    BP Readings from Last 1 Encounters:  10/18/23 108/79         Passed - Valid encounter within last 6 months    Recent Outpatient Visits           4 months ago Polycythemia   [facility] [name], MD   5 months ago Insomnia, unspecified type   [place] [facility] [name], MD   11 months ago Strain of left hip, initial encounter   [place] [facility] [name],  MD   1 year ago Diarrhea due to malabsorption   [place] [facility] Pickard, Priscille Heidelberg, MD   1 year ago Rectal pain   [place] [facility] [name], MD                 Requested Prescriptions  Pending Prescriptions Disp Refills   DULoxetine (CYMBALTA) 60 MG capsule [Pharmacy Med Name: DULOXETINE HCL DR 60 MG CAP] 90 capsule 0    Sig: TAKE 1 CAPSULE BY MOUTH EVERY DAY     Psychiatry: Antidepressants - SNRI - duloxetine Failed - 01/31/2024  8:38 AM      Failed - Cr in normal range and within 360 days    Creat  Date Value Ref Range Status  10/03/2021 0.91 0.50 - 0.99 mg/dL Final   Creatinine, Ser  Date Value Ref Range Status  07/25/2022 0.89 0.57 - 1.00 mg/dL Final   Creatinine, Urine  Date Value Ref Range Status  07/26/2021 43 20 - 275 mg/dL Final         Failed - eGFR is 30  or above and within 360 days    GFR, Est African American  Date Value Ref Range Status  08/24/2020 93 > OR = 60 mL/min/1.54m2 Final   GFR, Est Non African American  Date Value Ref Range Status  08/24/2020 80 > OR = 60 mL/min/1.69m2 Final   GFR, Estimated  Date Value Ref Range Status  09/27/2021 >60 >60 mL/min Final    Comment:    (NOTE) Calculated using the CKD-EPI Creatinine Equation (2021)    eGFR  Date Value Ref Range Status  07/25/2022 83 >59 mL/min/1.73 Final         Passed - Completed PHQ-2 or PHQ-9 in the last 360 days      Passed - Last BP in normal range    BP Readings from Last 1 Encounters:  10/18/23 108/79         Passed - Valid encounter within last 6 months    Recent Outpatient Visits           4 months ago Polycythemia   [place] [facility] [name], MD   5 months ago Insomnia, unspecified type   [place] [facility] [name], MD   11 months ago Strain of left hip, initial encounter   [place] [facility] [name], MD   1 year  ago Diarrhea due to malabsorption   [place] [facility] [name], MD   1 year ago Rectal pain   [place] [facility] Pickard, Priscille Heidelberg, MD

## 2024-02-10 ENCOUNTER — Encounter: Payer: Self-pay | Admitting: Family Medicine

## 2024-02-11 ENCOUNTER — Other Ambulatory Visit: Payer: Self-pay

## 2024-02-11 DIAGNOSIS — G4700 Insomnia, unspecified: Secondary | ICD-10-CM

## 2024-02-11 MED ORDER — CLONAZEPAM 1 MG PO TABS: 1.0000 mg | ORAL_TABLET | Freq: Two times a day (BID) | ORAL | 3 refills | Status: DC | PRN

## 2024-02-13 ENCOUNTER — Telehealth (HOSPITAL_COMMUNITY): Admitting: Family

## 2024-02-13 DIAGNOSIS — G479 Sleep disorder, unspecified: Secondary | ICD-10-CM

## 2024-02-13 MED ORDER — UNISOM SLEEPTABS 25 MG PO TABS: 50.0000 mg | ORAL_TABLET | Freq: Every evening | ORAL | 0 refills | Status: DC | PRN

## 2024-02-13 NOTE — Progress Notes (Addendum)
 Virtual Visit via Video Note  I connected with Virginia [name] on 02/13/24 at 10:30 AM EDT by a video enabled telemedicine application and verified that I am speaking with the correct person using two identifiers.  Location: Patient: Home Provider: Office   I discussed the limitations of evaluation and management by telemedicine and the availability of in person appointments. The patient expressed understanding and agreed to proceed.   I discussed the assessment and treatment plan with the patient. The patient was provided an opportunity to ask questions and all were answered. The patient agreed with the plan and demonstrated an understanding of the instructions.   The patient was advised to call back or seek an in-person evaluation if the symptoms worsen or if the condition fails to improve as anticipated.  I provided 20 minutes of non-face-to-face time during this encounter.   Virginia Rack, NP    [facility] MD/PA/NP OP Progress Note  02/13/2024 11:24 AM Virginia [name]  MRN:  [id number]  Chief Complaint:  Medication Management    HPI: Virginia [name] 43 year old female who presents for chronic insomnia symptoms.  States the night before last she was able to get "3 hours and 13 min of sleep".  Patient has tried multiple medications without symptom relief.    This provider will refer for ambulatory sleep study.  Reports she has tried Restoril/ temazepam, Seroquel (reported working for 3 nights) states it is no longer effective, Klonopin- ( reported fogginess and hallucinations), doxepin- (" like taking a TicTac."),  Trazodone 150 mg- unable to tolerate, Ambien-documented " blurry vision and migraines." , amitriptyline,  Remeron 7.5mg .    Patient reports she has tried removing electronics from the bedroom, taking a hot bath and turning off her phone to help improve her sleep hygiene.denies any significant life changes that would attribute to her sleeping hygiene/mood.  orders placed for Unisom and  ambulatory sleep study.   During evaluation Virginia [name] observed via care agility sitting in recliner; she is alert/oriented x 3; calm/cooperative; and mood congruent with affect.  Patient is speaking in a clear tone at moderate volume, and normal pace; with good eye contact.   Chart reviewed patient has a refill on Klonopin 1 mg p.o. twice daily by primary care provider. Virginia [name] thought process is coherent and relevant; There is no indication that she is currently responding to internal/external stimuli or experiencing delusional thought content.  Patient denies suicidal/self-harm/homicidal ideation, psychosis, and paranoia.  Patient has remained calm throughout assessment and has answered questions appropriately.    Visit Diagnosis:    ICD-10-CM   1. Sleep disturbance  G47.9 Ambulatory referral to Sleep Studies    CANCELED: Polysomnography 4 or more parameters      Past Psychiatric History:   Past Medical History:  Past Medical History:  Diagnosis Date   Arthritis    Chronic migraine w/o aura w/o status migrainosus, not intractable    followed by [name]   GAD (generalized anxiety disorder)    GERD (gastroesophageal reflux disease)    Hemorrhoids    History of cervical dysplasia    followed by GYN--- [name]. harris;    hx CIN 2  s/p  colposcopy in office 05/ 2022 and CIN 3  s/p LEEP 11/ 2022   History of syncope 02/2016   admission in epic;  in setting of vertigo and dx w/ MS   Insomnia    Multiple sclerosis, relapsing-remitting (HCC) 02/2016   neurologist--- [name];  dx 05/ 2017 lumbar puncture,  dyesthesia, fatigue, vertigo   PAC (premature atrial contraction)    PSVT (paroxysmal supraventricular tachycardia) (HCC) 2019   (02-01-2023 pt stated followed by pcp) // cardiologist--- dr g. taylor;  (lov in epic 06-21-2018)  evaluated 2019 after dx MS in 2017 with syncope/ palpitations;   event monitor showed PSVT,  PACs/ PVCs   PVC (premature ventricular contraction)    Sleep  apnea, unspecified    study in epic 08-07-2018  borderline sleep apnea,  recommendation loss wt, sleep hygiene, mouth guard    Past Surgical History:  Procedure Laterality Date   COLONOSCOPY WITH ESOPHAGOGASTRODUODENOSCOPY (EGD)  11/15/2022   [name];   EVALUATION UNDER ANESTHESIA WITH HEMORRHOIDECTOMY N/A 02/08/2023   Procedure: ANORECTAL EXAM UNDER ANESTHESIA;  Surgeon: [name], MD;  Location: [facility] [place];  Service: General;  Laterality: N/A;   HEMORRHOID SURGERY  02/08/2023   Procedure: EXTERNAL HEMORRHOIDECTOMY X2;  Surgeon: [name], MD;  Location: [facility];  Service: General;;   LEEP N/A 09/20/2021   Procedure: LOOP ELECTROSURGICAL EXCISION PROCEDURE (LEEP);  Surgeon: [name], MD;  Location: [facility];  Service: Gynecology;  Laterality: N/A;    Family Psychiatric History:   Family History:  Family History  Problem Relation Age of Onset   Emphysema Mother    Osteoarthritis Mother    Anxiety disorder Mother    Uterine cancer Mother 26   Other Father    Suicidality Father    Breast cancer Maternal Aunt    Breast cancer Maternal Aunt    Breast cancer Maternal Aunt    ALS Maternal Grandmother    Colon cancer Neg Hx    Colon polyps Neg Hx    Esophageal cancer Neg Hx    Rectal cancer Neg Hx    Stomach cancer Neg Hx     Social History:  Social History   Socioeconomic History   Marital status: Married    Spouse name: Not on file   Number of children: Not on file   Years of education: Not on file   Highest education level: 12th grade  Occupational History   Not on file  Tobacco Use   Smoking status: Former    Current packs/day: 0.00    Average packs/day: 0.5 packs/day for 10.0 years (5.0 ttl pk-yrs)    Types: Cigarettes    Start date: 2005    Quit date: 2015    Years since quitting: 10.2   Smokeless tobacco: Never  Vaping Use   Vaping status: Every Day   Substances: Nicotine, Flavoring,  Nicotine-salt   Devices: unsure name  Substance and Sexual Activity   Alcohol use: Yes    Comment: rarely   Drug use: Never   Sexual activity: Not Currently    Birth control/protection: Pill  Other Topics Concern   Not on file  Social History Narrative   Not on file   Social Drivers of Health   Financial Resource Strain: Medium Risk (08/17/2023)   Overall Financial Resource Strain (CARDIA)    Difficulty of Paying Living Expenses: Somewhat hard  Food Insecurity: Food Insecurity Present (08/17/2023)   Hunger Vital Sign    Worried About Running Out of Food in the Last Year: Sometimes true    Ran Out of Food in the Last Year: Often true  Transportation Needs: No Transportation Needs (08/17/2023)   PRAPARE - Administrator, Civil Service (Medical): No    Lack of Transportation (Non-Medical): No  Physical Activity: Insufficiently Active (  08/17/2023)   Exercise Vital Sign    Days of Exercise per Week: 3 days    Minutes of Exercise per Session: 10 min  Stress: Stress Concern Present (08/17/2023)   [employer] of Occupational Health - Occupational Stress Questionnaire    Feeling of Stress : Very much  Social Connections: Socially Isolated (08/17/2023)   Social Connection and Isolation Panel [NHANES]    Frequency of Communication with Friends and Family: More than three times a week    Frequency of Social Gatherings with Friends and Family: Never    Attends Religious Services: Never    Database administrator or Organizations: No    Attends Engineer, structural: Not on file    Marital Status: Separated    Allergies:  Allergies  Allergen Reactions   Codeine Other (See Comments)    Hallucinations    Onion Swelling   Topamax [Topiramate]     Was talking out of her head, walked into walls    Metabolic Disorder Labs: Lab Results  Component Value Date   HGBA1C 5.3 07/25/2022   No results found for: "PROLACTIN" Lab Results  Component Value Date    CHOL 109 07/25/2022   TRIG 76 07/25/2022   HDL 35 (L) 07/25/2022   CHOLHDL 3.1 07/25/2022   LDLCALC 58 07/25/2022   Lab Results  Component Value Date   TSH 1.280 07/25/2022   TSH 1.010 01/01/2018    Therapeutic Level Labs: No results found for: "LITHIUM" No results found for: "VALPROATE" No results found for: "CBMZ"  Current Medications: Current Outpatient Medications  Medication Sig Dispense Refill   doxylamine, Sleep, (UNISOM SLEEPTABS) 25 MG tablet Take 2 tablets (50 mg total) by mouth at bedtime as needed. 30 tablet 0   atenolol (TENORMIN) 50 MG tablet TAKE 1 TABLET BY MOUTH EVERY DAY 90 tablet 0   baclofen (LIORESAL) 10 MG tablet TAKE 2 TABLETS BY MOUTH 3 TIMES A DAY FOR SPASTICITY 540 tablet 0   clonazePAM (KLONOPIN) 1 MG tablet Take 1 tablet (1 mg total) by mouth 2 (two) times daily as needed for anxiety. 30 tablet 3   doxepin (SINEQUAN) 50 MG capsule Take 1 capsule (50 mg total) by mouth at bedtime. 30 capsule 0   drospirenone-ethinyl estradiol (YAZ) 3-0.02 MG tablet Take 1 tablet by mouth daily. 84 tablet 2   DULoxetine (CYMBALTA) 60 MG capsule TAKE 1 CAPSULE BY MOUTH EVERY DAY 90 capsule 0   etodolac (LODINE) 400 MG tablet Take 1 tablet (400 mg total) by mouth 2 (two) times daily. 180 tablet 1   gabapentin (NEURONTIN) 600 MG tablet Take 1 tablet (600 mg total) by mouth 4 (four) times daily. 360 tablet 1   KESIMPTA 20 MG/0.4ML SOAJ INJECT 1 PEN UNDER THE SKIN EVERY MONTH 0.4 mL 5   meclizine (ANTIVERT) 25 MG tablet Take 1 tablet (25 mg total) by mouth 3 (three) times daily as needed for dizziness. 30 tablet 11   pantoprazole (PROTONIX) 40 MG tablet Take 1 tablet (40 mg total) by mouth 2 (two) times daily. 180 tablet 0   Semaglutide-Weight Management (WEGOVY) 0.5 MG/0.5ML SOAJ Inject 0.5 mg into the skin once a week. 2 mL 1   SUMAtriptan (IMITREX) 50 MG tablet TAKE 1 TABLET EVERY 2 HRS AS NEEDED FOR MIGRAINE. MAY REPEAT IN 2 HRS IF HEADACHE PERSISTS OR RECURS 10 tablet 1    tiZANidine (ZANAFLEX) 4 MG tablet Take 1/2 to 1 pill po tid prn 30 tablet 1   No current  facility-administered medications for this visit.     Musculoskeletal:  Psychiatric Specialty Exam: Review of Systems  Psychiatric/Behavioral:  Positive for sleep disturbance. The patient is nervous/anxious.   All other systems reviewed and are negative.   There were no vitals taken for this visit.There is no height or weight on file to calculate BMI.  General Appearance: Casual  Eye Contact:  Good  Speech:  Clear and Coherent  Volume:  Normal  Mood:  Anxious and Depressed  Affect:  Congruent  Thought Process:  Coherent  Orientation:  Full (Time, Place, and Person)  Thought Content: Hallucinations: None   Suicidal Thoughts:  No  Homicidal Thoughts:  No  Memory:  Immediate;   Good Recent;   Good  Judgement:  Good  Insight:  Good  Psychomotor Activity:  Normal  Concentration:  Concentration: Good  Recall:  Good  Fund of Knowledge: Good  Language: Good  Akathisia:  No  Handed:  Right  AIMS (if indicated): not done  Assets:  Communication Skills Desire for Improvement Resilience Social Support  ADL's:  Intact  Cognition: WNL  Sleep:  Poor   Screenings: PHQ2-9    Flowsheet Row Office Visit from 10/03/20[facility] in [facility] Office Visit from 08/21/2023 in [facility] Office Visit from 07/21/2022 in [facility] Office Visit from 07/04/2022 in [facility] Office Visit from 07/15/2018 in [facility]  PHQ-2 Total Score 2 2 4 1  0  PHQ-9 Total Score 5 7 19  -- --      Flowsheet Row Admission (Discharged) from 02/08/2023 in [facility] ED from 09/27/2021 in [facility] Department at [facility] Admission (Discharged) from 09/20/2021 in [facility]  C-SSRS RISK CATEGORY No Risk No Risk Error: Question  2 not populated        Assessment and Plan: Mersadie Alanis presents for medication management related to sleep disturbance.  Documented history related to chronic insomnia.  Multiple medications tried in the past.  Patient referred to sleep clinic/ambulatory sleep study for medication management.  No concerns related to delirium, dizziness or hallucinations.  Encouraged over-the-counter Unisom and/or melatonin until follow-up outpatient appointment.  Collaboration of Care: Collaboration of Care: Other see chart for sleep study referral through Maryan Smalling  Patient/Guardian was advised Release of Information must be obtained prior to any record release in order to collaborate their care with an outside provider. Patient/Guardian was advised if they have not already done so to contact the registration department to sign all necessary forms in order for us  to release information regarding their care.   Consent: Patient/Guardian gives verbal consent for treatment and assignment of benefits for services provided during this visit. Patient/Guardian expressed understanding and agreed to proceed.    Levester Reagin, NP 02/13/2024, 11:24 AM

## 2024-02-16 ENCOUNTER — Other Ambulatory Visit (HOSPITAL_COMMUNITY): Payer: Self-pay | Admitting: Family

## 2024-03-10 ENCOUNTER — Other Ambulatory Visit: Payer: Self-pay | Admitting: Neurology

## 2024-03-10 NOTE — Telephone Encounter (Signed)
 Last seen on 09/11/23 Follow up scheduled on 03/26/24

## 2024-03-26 ENCOUNTER — Encounter: Payer: Self-pay | Admitting: Neurology

## 2024-03-26 ENCOUNTER — Ambulatory Visit (INDEPENDENT_AMBULATORY_CARE_PROVIDER_SITE_OTHER): Payer: BC Managed Care – PPO | Admitting: Neurology

## 2024-03-26 ENCOUNTER — Other Ambulatory Visit: Payer: Self-pay | Admitting: Family Medicine

## 2024-03-26 VITALS — BP 122/81 | HR 77 | Ht 64.0 in | Wt 227.0 lb

## 2024-03-26 DIAGNOSIS — R5383 Other fatigue: Secondary | ICD-10-CM | POA: Diagnosis not present

## 2024-03-26 DIAGNOSIS — G43709 Chronic migraine without aura, not intractable, without status migrainosus: Secondary | ICD-10-CM

## 2024-03-26 DIAGNOSIS — G4700 Insomnia, unspecified: Secondary | ICD-10-CM

## 2024-03-26 DIAGNOSIS — E559 Vitamin D deficiency, unspecified: Secondary | ICD-10-CM | POA: Diagnosis not present

## 2024-03-26 DIAGNOSIS — Z79899 Other long term (current) drug therapy: Secondary | ICD-10-CM

## 2024-03-26 DIAGNOSIS — G35 Multiple sclerosis: Secondary | ICD-10-CM | POA: Diagnosis not present

## 2024-03-26 DIAGNOSIS — I10 Essential (primary) hypertension: Secondary | ICD-10-CM

## 2024-03-26 DIAGNOSIS — R208 Other disturbances of skin sensation: Secondary | ICD-10-CM

## 2024-03-26 MED ORDER — TIZANIDINE HCL 4 MG PO TABS: ORAL_TABLET | ORAL | 3 refills | Status: AC

## 2024-03-26 MED ORDER — GABAPENTIN 600 MG PO TABS: 600.0000 mg | ORAL_TABLET | Freq: Four times a day (QID) | ORAL | 1 refills | Status: DC

## 2024-03-26 MED ORDER — BACLOFEN 20 MG PO TABS: ORAL_TABLET | ORAL | 3 refills | Status: AC

## 2024-03-26 NOTE — Progress Notes (Signed)
 [facility]  PATIENT: Virginia [name] DOB: 1980-11-03  REFERRING DOCTOR OR PCP:    Jannelle Memory SOURCE: patient, admit/discharge hospital notes, imaging reports, MRI's on PACS  _________________________________   HISTORICAL  CHIEF COMPLAINT:  Chief Complaint  Patient presents with   Follow-up    Pt in room 10 alone. Here for MS follow up, on Kesimpta . Pt reports doing okay, pt has noticed leg cramps more so at night for about 3-4 months. No recent falls, no recent eye exam.    HISTORY OF PRESENT ILLNESS:  Virginia [name] is a 43 y.o. woman with relapsing remitting multiple sclerosis who also has back pain.  Update  03/26/2024: She went back on Kesimpta  after converting to JCV Ab positive.   She tolerates it ok but feels a wearing off after a couple weeks.    She denies any exacerbation.   Last Tysabri  ws 01/03/2023   She is tolerating it well.   MRI of the brain 6/112023 showed  T2/FLAIR hyperintense foci in the cerebral hemispheres and the left medulla in a pattern consistent with chronic demyelinating plaque associated with multiple sclerosis.  None of the foci appear to be acute.  Compared to the MRI dated 01/20/2020, there are no new lesions.  Gait is about the same with mildly reduced balance.   She stumbles but no recent falls.  She uses the bannister going downstairs   She can walk > 1/2 without a break but does not keep up well with others.  .  She notes spasticity and cramping helped by Baclofen  20 mg po tid and tizanidine  at bedtime.       She has mild burning in her legs, right > left helped some by gabapentin  600 mg po qid.    No vertigo.     She denies hearing loss.      Bladder function is fine.     Vision is fine.   She notes fatigue is her main problem and some days she just wants to stay in bed.    Sleep is better with noght time tizanidine    Mood is fine.      Migraines occur about 8-10/month.    She gets a few a month still and takes sumatriptan .    Aimovig  had helped but insurance stopped coverage.  She gained back some of the weight she lost.     She has low back pain, worse when she stands and some pain down le.  She has a disc herniation at L5-S1.  She also has neck pain but not arm pain.   MS History:   In mid May 2017, she was starting to experience occasional lightheadedness and a spinning vertigo. On 03/19/2016, she had more extreme vertigo and had an episode of syncope. She notices that when she was walking she would veer towards the right. Because of the syncope, she was taken to the emergency room. He had an MRI of the brain that showed several enhancing lesions, potentially worrisome for multiple sclerosis. Additional studies were performed. She received 3 days of IV steroids. The MRI's of the spine did not show any additional plaques. The lumbar puncture showed oligoclonal bands with normal IgG index, consistent with multiple sclerosis.   She started  Aubagio .   MRI in 2018 showed additional foci plus she had tolerability issues.   She has been on Tysabri  since 12/26/2016.  She switched to Kesimpta  07/2021   She went back to Tysabri  07/2022.  LABS/LP She underwent a lumbar puncture on 03/21/2016. 4 oligoclonal bands present in the CSF which were not present in the serum, consistent with multiple sclerosis. The IgG index was high normal at 0.6.   There were only 4 white blood cells but 280 red blood cells more consistent with a slightly traumatic tap.    The [facility] Spotted Fever CSF IgG was negative but the IgM was positive at 1.41 (less than 0.9 normal).    ANA and ANCA were both negative.  IMAGING MRI images from 03/20/2016, 03/22/2016 and CT angiogram images from 03/23/2016 reviewed: The MRI of the brain shows several 2/fair hyperintense foci, some in the periventricular and juxtacortical white matter. Most of the foci enhanced after contrast administration. MRI of the cervical and thoracic spine did not show any spinal cord  plaques and there was no significant degenerative change.   CT angiogram was essentially normal showing no significant stenosis.  MRI 11/28/2016 showed multiple periventricular, subcortical, pericallosal as well as craniovertebral junction white matter hyperintensities compatible with multiple sclerosis. Multiple enhancing lesions are noted in the periventricular regions with the largest one in the left posterior frontal subcortical white matter. Incidental large venous angioma is noted in the right parietal region. Compared with MRI scan dated 03/20/2016 there are several new enhancing as well as nonenhancing white matter lesions.  MRI 12/25/2016 of the cervical spine showed two T2 hyperintense foci within the left posterolateral spinal cord, one adjacent to C6-C7 and one adjacent to T1. There may have been subtle signal within the spinal cord adjacent to C6-C7 on the previous scan but the current focus is clearly larger. The focus adjacent to T1 was not evident on the previous MRI. Neither of these 2 foci enhanced with contrast.         There is mild spinal stenosis at C4-C5, C5-C6 and C6-C7, unchanged when compared to the previous MRI. There is mild right foraminal narrowing at C6-C7 but there is no nerve root compression.  MRI 01/10/2018 of the brain showed multiple T2/FLAIR hyperintense foci in the brainstem and in the periventricular, juxtacortical and deep white matter in a pattern and configuration consistent with chronic demyelinating plaque associated with multiple sclerosis.  Compared to the MRI dated 11/28/2016, there is an improved appearance in the lesions that were enhancing at that time no longer do so.  There are no new lesions.  MRI [facility] 01/27/2018 showed, at L4-L5, there is stable mild left facet hypertrophy and mild ligamentum flavum hypertrophy.  There is no nerve root compression.     At L5-S1, there is a stable small central disc herniation causing bilateral lateral recess narrowing.  This  contacts both of the S1 nerve roots.  There is no definite change when compared to the previous MRI.  MRI brain 01/17/2019 showed multiple T2/flair hyperintense foci in the hemispheres in a pattern and configuration consistent with chronic demyelinating plaque associated with multiple sclerosis.  Two foci that have been seen previously in the brainstem are not apparent on the current MRI.  There are no new lesions compared to the 2019 MRI.  None of the foci enhance.    There is a small developmental venous anomaly in the right parietal lobe that is unchanged in appearance.  Otherwise, the enhancement pattern is normal.  MRI of the brain 01/20/2020 showed scattered T2/FLAIR hyperintense foci in the hemispheres and medulla in a pattern and configuration consistent with chronic demyelinating plaque associated with multiple sclerosis.   Compared to the MRI  from 01/17/2019, there are no new lesions.  MRI of the brain 6/112023 showed  T2/FLAIR hyperintense foci in the cerebral hemispheres and the left medulla in a pattern consistent with chronic demyelinating plaque associated with multiple sclerosis.  None of the foci appear to be acute.  Compared to the MRI dated 01/20/2020, there are no new lesions.    REVIEW OF SYSTEMS: Constitutional: No fevers, chills, sweats, or change in appetite.   She has fatigue. Insomnia much better with temazepam  Eyes: No visual changes, double vision, eye pain Ear, nose and throat: No hearing loss, ear pain, nasal congestion, sore throat Cardiovascular: No chest pain, palpitations Respiratory:  No shortness of breath at rest or with exertion.   No wheezes GastrointestinaI: No nausea, vomiting, diarrhea, abdominal pain, fecal incontinence Genitourinary:  No dysuria, urinary retention.   She reports frequency.  No nocturia. Musculoskeletal:  No neck pain, back pain Integumentary: No rash, pruritus, skin lesions Neurological: as above Psychiatric: No depression at this time.   No anxiety Endocrine: No palpitations, diaphoresis, change in appetite, change in weigh or increased thirst Hematologic/Lymphatic:  No anemia, purpura, petechiae. Allergic/Immunologic: No itchy/runny eyes, nasal congestion, recent allergic reactions, rashes  ALLERGIES: Allergies  Allergen Reactions   Codeine Other (See Comments)    Hallucinations    Onion Swelling   Topamax  [Topiramate ]     Was talking out of her head, walked into walls    HOME MEDICATIONS:  Current Outpatient Medications:    atenolol  (TENORMIN ) 50 MG tablet, TAKE 1 TABLET BY MOUTH EVERY DAY, Disp: 90 tablet, Rfl: 0   baclofen  (LIORESAL ) 10 MG tablet, TAKE 2 TABLETS BY MOUTH 3 TIMES A DAY FOR SPASTICITY, Disp: 540 tablet, Rfl: 0   clonazePAM  (KLONOPIN ) 1 MG tablet, Take 1 tablet (1 mg total) by mouth 2 (two) times daily as needed for anxiety., Disp: 30 tablet, Rfl: 3   doxylamine , Sleep, (UNISOM  SLEEPTABS) 25 MG tablet, Take 2 tablets (50 mg total) by mouth at bedtime as needed., Disp: 30 tablet, Rfl: 0   drospirenone -ethinyl estradiol  (YAZ) 3-0.02 MG tablet, Take 1 tablet by mouth daily., Disp: 84 tablet, Rfl: 2   DULoxetine  (CYMBALTA ) 60 MG capsule, TAKE 1 CAPSULE BY MOUTH EVERY DAY, Disp: 90 capsule, Rfl: 0   etodolac  (LODINE ) 400 MG tablet, Take 1 tablet (400 mg total) by mouth 2 (two) times daily., Disp: 180 tablet, Rfl: 1   gabapentin  (NEURONTIN ) 600 MG tablet, Take 1 tablet (600 mg total) by mouth 4 (four) times daily., Disp: 360 tablet, Rfl: 1   KESIMPTA  20 MG/0.4ML SOAJ, INJECT 1 PEN UNDER THE SKIN EVERY MONTH, Disp: 0.4 mL, Rfl: 5   meclizine  (ANTIVERT ) 25 MG tablet, Take 1 tablet (25 mg total) by mouth 3 (three) times daily as needed for dizziness., Disp: 30 tablet, Rfl: 11   pantoprazole  (PROTONIX ) 40 MG tablet, Take 1 tablet (40 mg total) by mouth 2 (two) times daily., Disp: 180 tablet, Rfl: 0   SUMAtriptan  (IMITREX ) 50 MG tablet, TAKE 1 TABLET EVERY 2 HRS AS NEEDED FOR MIGRAINE. MAY REPEAT IN 2 HRS IF  HEADACHE PERSISTS OR RECURS, Disp: 10 tablet, Rfl: 1   tiZANidine  (ZANAFLEX ) 4 MG tablet, TAKE 1/2 TO 1 TAB BY MOUTH 3 TIMES A DAY AS NEEDED, Disp: 30 tablet, Rfl: 1   doxepin  (SINEQUAN ) 50 MG capsule, Take 1 capsule (50 mg total) by mouth at bedtime. (Patient not taking: Reported on 03/26/2024), Disp: 30 capsule, Rfl: 0   Semaglutide -Weight Management (WEGOVY ) 0.5 MG/0.5ML SOAJ, Inject 0.5  mg into the skin once a week. (Patient not taking: Reported on 03/26/2024), Disp: 2 mL, Rfl: 1  PAST MEDICAL HISTORY: Past Medical History:  Diagnosis Date   Arthritis    Chronic migraine w/o aura w/o status migrainosus, not intractable    followed by dr Godwin Lat   GAD (generalized anxiety disorder)    GERD (gastroesophageal reflux disease)    Hemorrhoids    History of cervical dysplasia    followed by GYN--- dr Ardeth Beckers. harris;    hx CIN 2  s/p  colposcopy in office 05/ 2022 and CIN 3  s/p LEEP 11/ 2022   History of syncope 02/2016   admission in epic;  in setting of vertigo and dx w/ MS   Insomnia    Multiple sclerosis, relapsing-remitting (HCC) 02/2016   neurologist--- dr Godwin Lat;  dx 05/ 2017 lumbar puncture,  dyesthesia, fatigue, vertigo   PAC (premature atrial contraction)    PSVT (paroxysmal supraventricular tachycardia) (HCC) 2019   (02-01-2023 pt stated followed by pcp) // cardiologist--- dr g. taylor;  (lov in epic 06-21-2018)  evaluated 2019 after dx MS in 2017 with syncope/ palpitations;   event monitor showed PSVT,  PACs/ PVCs   PVC (premature ventricular contraction)    Sleep apnea, unspecified    study in epic 08-07-2018  borderline sleep apnea,  recommendation loss wt, sleep hygiene, mouth guard    PAST SURGICAL HISTORY: Past Surgical History:  Procedure Laterality Date   COLONOSCOPY WITH ESOPHAGOGASTRODUODENOSCOPY (EGD)  11/15/2022   dr Dominic Friendly;   EVALUATION UNDER ANESTHESIA WITH HEMORRHOIDECTOMY N/A 02/08/2023   Procedure: ANORECTAL EXAM UNDER ANESTHESIA;  Surgeon: Melvenia Stabs, MD;   Location: [facility] [place];  Service: General;  Laterality: N/A;   HEMORRHOID SURGERY  02/08/2023   Procedure: EXTERNAL HEMORRHOIDECTOMY X2;  Surgeon: Melvenia Stabs, MD;  Location: [facility];  Service: General;;   LEEP N/A 09/20/2021   Procedure: LOOP ELECTROSURGICAL EXCISION PROCEDURE (LEEP);  Surgeon: Alben Alma, MD;  Location: [facility];  Service: Gynecology;  Laterality: N/A;    FAMILY HISTORY: Family History  Problem Relation Age of Onset   Emphysema Mother    Osteoarthritis Mother    Anxiety disorder Mother    Uterine cancer Mother 27   Other Father    Suicidality Father    Breast cancer Maternal Aunt    Breast cancer Maternal Aunt    Breast cancer Maternal Aunt    ALS Maternal Grandmother    Colon cancer Neg Hx    Colon polyps Neg Hx    Esophageal cancer Neg Hx    Rectal cancer Neg Hx    Stomach cancer Neg Hx     SOCIAL HISTORY:  Social History   Socioeconomic History   Marital status: Married    Spouse name: Not on file   Number of children: Not on file   Years of education: Not on file   Highest education level: 12th grade  Occupational History   Not on file  Tobacco Use   Smoking status: Former    Current packs/day: 0.00    Average packs/day: 0.5 packs/day for 10.0 years (5.0 ttl pk-yrs)    Types: Cigarettes    Start date: 2005    Quit date: 2015    Years since quitting: 10.4   Smokeless tobacco: Never  Vaping Use   Vaping status: Every Day   Substances: Nicotine, Flavoring, Nicotine-salt   Devices: unsure name  Substance and Sexual Activity   Alcohol use: Yes  Comment: rarely   Drug use: Never   Sexual activity: Not Currently    Birth control/protection: Pill  Other Topics Concern   Not on file  Social History Narrative   Not on file   Social Drivers of Health   Financial Resource Strain: Medium Risk (08/17/2023)   Overall Financial Resource Strain (CARDIA)    Difficulty of Paying Living Expenses:  Somewhat hard  Food Insecurity: Food Insecurity Present (08/17/2023)   Hunger Vital Sign    Worried About Running Out of Food in the Last Year: Sometimes true    Ran Out of Food in the Last Year: Often true  Transportation Needs: No Transportation Needs (08/17/2023)   PRAPARE - Administrator, Civil Service (Medical): No    Lack of Transportation (Non-Medical): No  Physical Activity: Insufficiently Active (08/17/2023)   Exercise Vital Sign    Days of Exercise per Week: 3 days    Minutes of Exercise per Session: 10 min  Stress: Stress Concern Present (08/17/2023)   [employer] of Occupational Health - Occupational Stress Questionnaire    Feeling of Stress : Very much  Social Connections: Socially Isolated (08/17/2023)   Social Connection and Isolation Panel [NHANES]    Frequency of Communication with Friends and Family: More than three times a week    Frequency of Social Gatherings with Friends and Family: Never    Attends Religious Services: Never    Database administrator or Organizations: No    Attends Engineer, structural: Not on file    Marital Status: Separated  Intimate Partner Violence: Not on file     PHYSICAL EXAM  Vitals:   03/26/24 1434  BP: 122/81  Pulse: 77  SpO2: 98%  Weight: 227 lb (103 kg)  Height: 5\' 4"  (1.626 m)    Body mass index is 38.96 kg/m.   General: The patient is well-developed and well-nourished and in no acute distress,.  She has mild pedal edema.  The knees are not erythematous or hot with normal range of motion  Neurologic Exam  Mental status: The patient is alert and oriented x 3 at the time of the examination. The patient has apparent normal recent and remote memory, with an apparently normal attention span and concentration ability.   Speech is normal.  Cranial nerves: Extraocular muscles are full.  No nystagmus today..  Facial strength and sensation is normal. Trapezius strength is normal. No obvious hearing  deficits are noted.  Motor:  Muscle bulk is normal.  Strength is 5/5. Tone is mildly increased in the legs.  Sensory: She had intact sensation to touch and vibration in arms but reduced sensation to touch/temp and vibration in right leg.    Coordination: Cerebellar testing reveals good finger-nose-finger and heel-to-shin bilaterally.  Gait and station: Station is normal.   Mildly arthritic gait  The tandem gait is mildly ide.  The Romberg is negative.  Reflexes: Deep tendon reflexes are symmetric and normal in arms.  DTRs are brisk at knees with spread but no ankle clonus.    DIAGNOSTIC DATA (LABS, IMAGING, TESTING) - I reviewed patient records, labs, notes, testing and imaging myself where available.  Lab Results  Component Value Date   WBC 7.7 09/11/2023   HGB 16.2 (H) 09/11/2023   HCT 47.7 (H) 09/11/2023   MCV 100 (H) 09/11/2023   PLT 358 09/11/2023      Component Value Date/Time   NA 140 07/25/2022 1558   K 3.7 07/25/2022 1558  CL 104 07/25/2022 1558   CO2 21 07/25/2022 1558   GLUCOSE 104 (H) 07/25/2022 1558   GLUCOSE 120 (H) 10/03/2021 1645   BUN 12 07/25/2022 1558   CREATININE 0.89 07/25/2022 1558   CREATININE 0.91 10/03/2021 1645   CALCIUM 8.9 07/25/2022 1558   PROT 5.7 (L) 07/25/2022 1558   ALBUMIN 3.7 (L) 07/25/2022 1558   AST 15 07/25/2022 1558   ALT 34 (H) 07/25/2022 1558   ALKPHOS 77 07/25/2022 1558   BILITOT <0.2 07/25/2022 1558   GFRNONAA >60 09/27/2021 1656   GFRNONAA 80 08/24/2020 1643   GFRAA 93 08/24/2020 1643    Lab Results  Component Value Date   TSH 1.280 07/25/2022       ASSESSMENT AND PLAN    1. Relapsing remitting multiple sclerosis (HCC)   2. High risk medication use   3. Chronic migraine w/o aura w/o status migrainosus, not intractable   4. Other fatigue   5. Dysesthesia   6. Insomnia, unspecified type       1.  continue Kesimpta .  JCV antibody was positive   2.   Continue baclofen  up to 20 mg po tid  ad qHS  tizanidine  3.   Continue gabapentin  600 mg 3-4 times a day.  If dysesthesias worsen, consider lamotrigine .    4.   For headaches, continue sumatriptan  for breakthrough. 5.   Return 6 months.  Call sooner if there are new or worsening neurologic symptoms.  Lateasha Breuer A. Godwin Lat, MD, PhD 03/26/2024, 2:47 PM Certified in Neurology, Clinical Neurophysiology, Sleep Medicine, Pain Medicine and Neuroimaging  [facility] [address] [place], [place] [address] [phone]

## 2024-03-27 ENCOUNTER — Encounter: Payer: Self-pay | Admitting: Neurology

## 2024-03-27 ENCOUNTER — Ambulatory Visit: Payer: Self-pay | Admitting: Neurology

## 2024-03-27 LAB — CBC WITH DIFFERENTIAL/PLATELET
Basophils Absolute: 0.1 10*3/uL (ref 0.0–0.2)
Basos: 1 %
EOS (ABSOLUTE): 0.3 10*3/uL (ref 0.0–0.4)
Eos: 4 %
Hematocrit: 42.7 % (ref 34.0–46.6)
Hemoglobin: 14.1 g/dL (ref 11.1–15.9)
Immature Grans (Abs): 0 10*3/uL (ref 0.0–0.1)
Immature Granulocytes: 0 %
Lymphocytes Absolute: 1.8 10*3/uL (ref 0.7–3.1)
Lymphs: 22 %
MCH: 32.5 pg (ref 26.6–33.0)
MCHC: 33 g/dL (ref 31.5–35.7)
MCV: 98 fL — ABNORMAL HIGH (ref 79–97)
Monocytes Absolute: 0.5 10*3/uL (ref 0.1–0.9)
Monocytes: 7 %
Neutrophils Absolute: 5.2 10*3/uL (ref 1.4–7.0)
Neutrophils: 66 %
Platelets: 328 10*3/uL (ref 150–450)
RBC: 4.34 x10E6/uL (ref 3.77–5.28)
RDW: 11.9 % (ref 11.7–15.4)
WBC: 7.9 10*3/uL (ref 3.4–10.8)

## 2024-03-27 LAB — IGG, IGA, IGM
IgA/Immunoglobulin A, Serum: 126 mg/dL (ref 87–352)
IgG (Immunoglobin G), Serum: 923 mg/dL (ref 586–1602)
IgM (Immunoglobulin M), Srm: 22 mg/dL — ABNORMAL LOW (ref 26–217)

## 2024-03-27 LAB — VITAMIN D 25 HYDROXY (VIT D DEFICIENCY, FRACTURES): Vit D, 25-Hydroxy: 57.6 ng/mL (ref 30.0–100.0)

## 2024-04-04 ENCOUNTER — Other Ambulatory Visit: Payer: Self-pay | Admitting: Neurology

## 2024-04-08 NOTE — Telephone Encounter (Signed)
 Last seen on 03/26/24 Follow up scheduled on 10/27/24  Rx was already sent on 03/26/24 for 90 day supply with refills.

## 2024-04-14 ENCOUNTER — Encounter: Payer: Self-pay | Admitting: Family Medicine

## 2024-04-14 ENCOUNTER — Ambulatory Visit: Admitting: Family Medicine

## 2024-04-14 VITALS — BP 108/82 | HR 88 | Ht 64.0 in | Wt 223.0 lb

## 2024-04-14 DIAGNOSIS — M25511 Pain in right shoulder: Secondary | ICD-10-CM

## 2024-04-14 NOTE — Progress Notes (Signed)
 Subjective:    Patient ID: Virginia [name], female    DOB: 20-Mar-1981, 43 y.o.   MRN: [id number] Patient reports a 1 month history of pain in her right shoulder.  The pain hurts with abduction greater than 110 degrees.  It aches and throbs at night.  She also reports some pain with internal rotation.  She has tenderness to palpation of the [facility] joint as well.  She has a negative Jurgenson's and speeds test.  She does have pain with passive range of motion of the shoulder specifically abduction. Past Medical History:  Diagnosis Date   Arthritis    Chronic migraine w/o aura w/o status migrainosus, not intractable    followed by dr Godwin Lat   GAD (generalized anxiety disorder)    GERD (gastroesophageal reflux disease)    Hemorrhoids    History of cervical dysplasia    followed by GYN--- dr Ardeth Beckers. harris;    hx CIN 2  s/p  colposcopy in office 05/ 2022 and CIN 3  s/p LEEP 11/ 2022   History of syncope 02/2016   admission in epic;  in setting of vertigo and dx w/ MS   Insomnia    Multiple sclerosis, relapsing-remitting (HCC) 02/2016   neurologist--- dr Godwin Lat;  dx 05/ 2017 lumbar puncture,  dyesthesia, fatigue, vertigo   PAC (premature atrial contraction)    PSVT (paroxysmal supraventricular tachycardia) (HCC) 2019   (02-01-2023 pt stated followed by pcp) // cardiologist--- dr g. taylor;  (lov in epic 06-21-2018)  evaluated 2019 after dx MS in 2017 with syncope/ palpitations;   event monitor showed PSVT,  PACs/ PVCs   PVC (premature ventricular contraction)    Sleep apnea, unspecified    study in epic 08-07-2018  borderline sleep apnea,  recommendation loss wt, sleep hygiene, mouth guard   Past Surgical History:  Procedure Laterality Date   COLONOSCOPY WITH ESOPHAGOGASTRODUODENOSCOPY (EGD)  11/15/2022   dr Dominic Friendly;   EVALUATION UNDER ANESTHESIA WITH HEMORRHOIDECTOMY N/A 02/08/2023   Procedure: ANORECTAL EXAM UNDER ANESTHESIA;  Surgeon: Melvenia Stabs, MD;  Location: [facility] ;   Service: General;  Laterality: N/A;   HEMORRHOID SURGERY  02/08/2023   Procedure: EXTERNAL HEMORRHOIDECTOMY X2;  Surgeon: Melvenia Stabs, MD;  Location: [facility];  Service: General;;   LEEP N/A 09/20/2021   Procedure: LOOP ELECTROSURGICAL EXCISION PROCEDURE (LEEP);  Surgeon: Alben Alma, MD;  Location: [facility];  Service: Gynecology;  Laterality: N/A;   Current Outpatient Medications on File Prior to Visit  Medication Sig Dispense Refill   atenolol  (TENORMIN ) 50 MG tablet TAKE 1 TABLET BY MOUTH EVERY DAY 90 tablet 0   baclofen  (LIORESAL ) 20 MG tablet TAKE 2 TABLETS BY MOUTH 3 TIMES A DAY FOR SPASTICITY 270 tablet 3   clonazePAM  (KLONOPIN ) 1 MG tablet Take 1 tablet (1 mg total) by mouth 2 (two) times daily as needed for anxiety. 30 tablet 3   doxepin  (SINEQUAN ) 50 MG capsule Take 1 capsule (50 mg total) by mouth at bedtime. 30 capsule 0   doxylamine , Sleep, (UNISOM  SLEEPTABS) 25 MG tablet Take 2 tablets (50 mg total) by mouth at bedtime as needed. 30 tablet 0   drospirenone -ethinyl estradiol  (YAZ) 3-0.02 MG tablet Take 1 tablet by mouth daily. 84 tablet 2   DULoxetine  (CYMBALTA ) 60 MG capsule TAKE 1 CAPSULE BY MOUTH EVERY DAY 90 capsule 0   etodolac  (LODINE ) 400 MG tablet Take 1 tablet (400 mg total) by mouth 2 (two) times daily. 180 tablet 1  gabapentin  (NEURONTIN ) 600 MG tablet Take 1 tablet (600 mg total) by mouth 4 (four) times daily. 360 tablet 1   KESIMPTA  20 MG/0.4ML SOAJ INJECT 1 PEN UNDER THE SKIN EVERY MONTH 0.4 mL 5   meclizine  (ANTIVERT ) 25 MG tablet Take 1 tablet (25 mg total) by mouth 3 (three) times daily as needed for dizziness. 30 tablet 11   pantoprazole  (PROTONIX ) 40 MG tablet Take 1 tablet (40 mg total) by mouth 2 (two) times daily. 180 tablet 0   Semaglutide -Weight Management (WEGOVY ) 0.5 MG/0.5ML SOAJ Inject 0.5 mg into the skin once a week. 2 mL 1   SUMAtriptan  (IMITREX ) 50 MG tablet TAKE 1 TABLET EVERY 2 HRS AS NEEDED FOR MIGRAINE. MAY REPEAT  IN 2 HRS IF HEADACHE PERSISTS OR RECURS 10 tablet 1   tiZANidine  (ZANAFLEX ) 4 MG tablet One po bid 180 tablet 3   No current facility-administered medications on file prior to visit.   Allergies  Allergen Reactions   Codeine Other (See Comments)    Hallucinations    Onion Swelling   Topamax  [Topiramate ]     Was talking out of her head, walked into walls   Social History   Socioeconomic History   Marital status: Married    Spouse name: Not on file   Number of children: Not on file   Years of education: Not on file   Highest education level: 12th grade  Occupational History   Not on file  Tobacco Use   Smoking status: Former    Current packs/day: 0.00    Average packs/day: 0.5 packs/day for 10.0 years (5.0 ttl pk-yrs)    Types: Cigarettes    Start date: 2005    Quit date: 2015    Years since quitting: 10.4   Smokeless tobacco: Never  Vaping Use   Vaping status: Every Day   Substances: Nicotine, Flavoring, Nicotine-salt   Devices: unsure name  Substance and Sexual Activity   Alcohol use: Yes    Comment: rarely   Drug use: Never   Sexual activity: Not Currently    Birth control/protection: Pill  Other Topics Concern   Not on file  Social History Narrative   Not on file   Social Drivers of Health   Financial Resource Strain: Medium Risk (04/11/2024)   Overall Financial Resource Strain (CARDIA)    Difficulty of Paying Living Expenses: Somewhat hard  Food Insecurity: Patient Declined (04/11/2024)   Hunger Vital Sign    Worried About Running Out of Food in the Last Year: Patient declined    Ran Out of Food in the Last Year: Patient declined  Transportation Needs: No Transportation Needs (04/11/2024)   PRAPARE - Administrator, Civil Service (Medical): No    Lack of Transportation (Non-Medical): No  Physical Activity: Unknown (04/11/2024)   Exercise Vital Sign    Days of Exercise per Week: Patient declined    Minutes of Exercise per Session: Not on file   Stress: Stress Concern Present (04/11/2024)   [employer] of Occupational Health - Occupational Stress Questionnaire    Feeling of Stress: Rather much  Social Connections: Moderately Isolated (04/11/2024)   Social Connection and Isolation Panel    Frequency of Communication with Friends and Family: More than three times a week    Frequency of Social Gatherings with Friends and Family: Twice a week    Attends Religious Services: Patient declined    Database administrator or Organizations: No    Attends Banker Meetings:  Not on file    Marital Status: Married  Intimate Partner Violence: Not on file      Review of Systems  All other systems reviewed and are negative.      Objective:   Physical Exam Constitutional:      Appearance: Normal appearance. She is obese.   Cardiovascular:     Rate and Rhythm: Normal rate and regular rhythm.     Heart sounds: Normal heart sounds. No murmur heard. Pulmonary:     Effort: Pulmonary effort is normal. No respiratory distress.     Breath sounds: Normal breath sounds. No stridor. No wheezing, rhonchi or rales.   Musculoskeletal:     Right shoulder: Tenderness present. No swelling, deformity or effusion. Decreased range of motion. Normal strength.   Neurological:     General: No focal deficit present.     Mental Status: She is alert and oriented to person, place, and time. Mental status is at baseline.     Cranial Nerves: No cranial nerve deficit.         Assessment & Plan:  Acute pain of right shoulder Patient had similar pain in December.  At that time I thought she had subacromial bursitis.  I still feel that this is likely subacromial bursitis.  Using sterile technique, I injected the right subacromial space with 2 cc of lidocaine , 2 cc of Marcaine , 40 mg/mL Kenalog.  Patient tolerated the procedure well without complication.

## 2024-04-21 DIAGNOSIS — M25511 Pain in right shoulder: Secondary | ICD-10-CM

## 2024-04-21 MED ORDER — TRIAMCINOLONE ACETONIDE 40 MG/ML IJ SUSP
40.0000 mg | Freq: Once | INTRAMUSCULAR | Status: AC
  Administered 2024-04-21: 40 mg via INTRAMUSCULAR

## 2024-04-21 MED ORDER — BUPIVACAINE HCL 0.25 % IJ SOLN
5.0000 mL | Freq: Once | INTRAMUSCULAR | Status: AC
  Administered 2024-04-21: 5 mL

## 2024-04-21 NOTE — Addendum Note (Signed)
 Addended by: CORINNA JESUSA SAUNDERS on: 04/21/2024 09:50 AM   Modules accepted: Orders

## 2024-05-04 ENCOUNTER — Encounter: Payer: Self-pay | Admitting: Family Medicine

## 2024-05-06 ENCOUNTER — Other Ambulatory Visit: Payer: Self-pay

## 2024-05-06 ENCOUNTER — Other Ambulatory Visit: Payer: Self-pay | Admitting: Family Medicine

## 2024-05-06 DIAGNOSIS — F320 Major depressive disorder, single episode, mild: Secondary | ICD-10-CM

## 2024-05-06 DIAGNOSIS — M25511 Pain in right shoulder: Secondary | ICD-10-CM

## 2024-05-06 DIAGNOSIS — F410 Panic disorder [episodic paroxysmal anxiety] without agoraphobia: Secondary | ICD-10-CM

## 2024-06-02 ENCOUNTER — Other Ambulatory Visit: Payer: Self-pay

## 2024-06-02 ENCOUNTER — Other Ambulatory Visit (INDEPENDENT_AMBULATORY_CARE_PROVIDER_SITE_OTHER): Payer: Self-pay

## 2024-06-02 ENCOUNTER — Ambulatory Visit (INDEPENDENT_AMBULATORY_CARE_PROVIDER_SITE_OTHER): Admitting: Orthopedic Surgery

## 2024-06-02 DIAGNOSIS — M19011 Primary osteoarthritis, right shoulder: Secondary | ICD-10-CM

## 2024-06-02 DIAGNOSIS — M25511 Pain in right shoulder: Secondary | ICD-10-CM

## 2024-06-03 ENCOUNTER — Encounter: Payer: Self-pay | Admitting: Orthopedic Surgery

## 2024-06-03 DIAGNOSIS — M19011 Primary osteoarthritis, right shoulder: Secondary | ICD-10-CM | POA: Diagnosis not present

## 2024-06-03 MED ORDER — BUPIVACAINE HCL 0.25 % IJ SOLN
0.6600 mL | INTRAMUSCULAR | Status: AC | PRN
  Administered 2024-06-03: .66 mL via INTRA_ARTICULAR

## 2024-06-03 MED ORDER — LIDOCAINE HCL 1 % IJ SOLN
3.0000 mL | INTRAMUSCULAR | Status: AC | PRN
  Administered 2024-06-03: 3 mL

## 2024-06-03 MED ORDER — TRIAMCINOLONE ACETONIDE 40 MG/ML IJ SUSP
40.0000 mg | INTRAMUSCULAR | Status: AC | PRN
  Administered 2024-06-03: 40 mg via INTRA_ARTICULAR

## 2024-06-03 NOTE — Progress Notes (Signed)
 Office Visit Note   Patient: Virginia [name]           Date of Birth: 1981/03/05           MRN: [id number] Visit Date: 06/02/2024 Requested by: Duanne Butler DASEN, MD [address] [place],  [place] [address] PCP: Duanne Butler DASEN, MD  Subjective: Chief Complaint  Patient presents with   Right Shoulder - Pain    HPI: Virginia [name] is a 43 y.o. female who presents to the office reporting right shoulder pain.  Pain has been going on for 3 months without any history of injury.  Patient is right-hand dominant.  Describes some occasional radiation down into the arm but not below the elbow.  Episodic numbness and tingling but no neck pain.  Denies any decrease in range of motion.  States that it hurts her when she brings it across her body.  Most of her pain is in the front and superior in the shoulder.  Hard for her to sleep on that right side.  Reports just pain but no stiffness.  She does have MS.  No new numbness and tingling in that right arm.  Ibuprofen no relief.              ROS: All systems reviewed are negative as they relate to the chief complaint within the history of present illness.  Patient denies fevers or chills.  Assessment & Plan: Visit Diagnoses:  1. Right shoulder pain, unspecified chronicity     Plan: Impression is right shoulder symptomatic AC joint arthritis/early osteolysis.  Radiographs do show some cystic degenerative changes in that distal end of the clavicle.  Plan is AC joint injection with 6-week return with decision for against further injection at that time.  Follow-Up Instructions: No follow-ups on file.   Orders:  Orders Placed This Encounter  Procedures   XR Shoulder Right   XR Cervical Spine 2 or 3 views   US  Guided Needle Placement - No Linked Charges   No orders of the defined types were placed in this encounter.     Procedures: Medium Joint Inj: R acromioclavicular on 06/03/2024 11:08 AM Indications: diagnostic evaluation and  pain Details: 25 G 1.5 in needle, ultrasound-guided superior approach Medications: 3 mL lidocaine  1 %; 0.66 mL bupivacaine  0.25 %; 40 mg triamcinolone  acetonide 40 MG/ML Outcome: tolerated well, no immediate complications Procedure, treatment alternatives, risks and benefits explained, specific risks discussed. Consent was given by the patient. Immediately prior to procedure a time out was called to verify the correct patient, procedure, equipment, support staff and site/side marked as required. Patient was prepped and draped in the usual sterile fashion.       Clinical Data: No additional findings.  Objective: Vital Signs: There were no vitals taken for this visit.  Physical Exam:  Constitutional: Patient appears well-developed HEENT:  Head: Normocephalic Eyes:EOM are normal Neck: Normal range of motion Cardiovascular: Normal rate Pulmonary/chest: Effort normal Neurologic: Patient is alert Skin: Skin is warm Psychiatric: Patient has normal mood and affect  Ortho Exam: Ortho exam demonstrates range of motion on the left and right of 70/115/180.  Is very good rotator cuff strength infraspinatus supraspinatus and subscap muscle testing.  With careful palpation she does have a little bit of crepitus around the [facility] joint with passive range of motion of the arm particularly pain with crossarm adduction on the right but not on the left.  Also has more tenderness to palpation on the right-hand side  than the left-hand side.  Negative O'Brien's testing.  Cervical spine range of motion full.  Motor sensory function of the hand is intact.  Specialty Comments:  No specialty comments available.  Imaging: No results found.   PMFS History: Patient Active Problem List   Diagnosis Date Noted   Body mass index (BMI) 34.0-34.9, adult 09/05/2023   Leiomyoma 08/14/2023   Family history of breast cancer 07/25/2022   Chronic migraine w/o aura w/o status migrainosus, not intractable 09/01/2021    Dysplasia of cervix, high grade CIN 2 03/18/2021   LGSIL on Pap smear of cervix 02/28/2021   Vertigo of central origin 06/24/2020   Numbness 06/24/2020   Panic disorder 11/11/2019   Current mild episode of major depressive disorder without prior episode (HCC) 11/11/2019   Snoring 06/20/2018   Excessive daytime sleepiness 06/20/2018   Neck pain 01/01/2018   Palpitations 01/01/2018   Myalgia 09/03/2017   Chronic low back pain without sciatica 09/03/2017   Right leg pain 07/26/2017   Bilateral sciatica 07/12/2017   Nystagmus 02/15/2017   Spell of altered consciousness 07/19/2016   Ataxic gait 05/31/2016   Other fatigue 05/31/2016   Dysesthesia 05/31/2016   High risk medication use 03/31/2016   Insomnia 03/31/2016   Dizziness    Syncope    Multiple sclerosis (HCC) 03/21/2016   Relapsing remitting multiple sclerosis (HCC)    Vertigo    White matter abnormality on MRI of brain 03/20/2016   Faintness    Past Medical History:  Diagnosis Date   Arthritis    Chronic migraine w/o aura w/o status migrainosus, not intractable    followed by dr vear   GAD (generalized anxiety disorder)    GERD (gastroesophageal reflux disease)    Hemorrhoids    History of cervical dysplasia    followed by GYN--- dr jonelle. harris;    hx CIN 2  s/p  colposcopy in office 05/ 2022 and CIN 3  s/p LEEP 11/ 2022   History of syncope 02/2016   admission in epic;  in setting of vertigo and dx w/ MS   Insomnia    Multiple sclerosis, relapsing-remitting (HCC) 02/2016   neurologist--- dr vear;  dx 05/ 2017 lumbar puncture,  dyesthesia, fatigue, vertigo   PAC (premature atrial contraction)    PSVT (paroxysmal supraventricular tachycardia) (HCC) 2019   (02-01-2023 pt stated followed by pcp) // cardiologist--- dr g. taylor;  (lov in epic 06-21-2018)  evaluated 2019 after dx MS in 2017 with syncope/ palpitations;   event monitor showed PSVT,  PACs/ PVCs   PVC (premature ventricular contraction)    Sleep apnea,  unspecified    study in epic 08-07-2018  borderline sleep apnea,  recommendation loss wt, sleep hygiene, mouth guard    Family History  Problem Relation Age of Onset   Emphysema Mother    Osteoarthritis Mother    Anxiety disorder Mother    Uterine cancer Mother 41   Other Father    Suicidality Father    Breast cancer Maternal Aunt    Breast cancer Maternal Aunt    Breast cancer Maternal Aunt    ALS Maternal Grandmother    Colon cancer Neg Hx    Colon polyps Neg Hx    Esophageal cancer Neg Hx    Rectal cancer Neg Hx    Stomach cancer Neg Hx     Past Surgical History:  Procedure Laterality Date   COLONOSCOPY WITH ESOPHAGOGASTRODUODENOSCOPY (EGD)  11/15/2022   dr legrand;   EVALUATION UNDER ANESTHESIA  WITH HEMORRHOIDECTOMY N/A 02/08/2023   Procedure: ANORECTAL EXAM UNDER ANESTHESIA;  Surgeon: Teresa Lonni HERO, MD;  Location: [facility];  Service: General;  Laterality: N/A;   HEMORRHOID SURGERY  02/08/2023   Procedure: EXTERNAL HEMORRHOIDECTOMY X2;  Surgeon: Teresa Lonni HERO, MD;  Location: [facility];  Service: General;;   LEEP N/A 09/20/2021   Procedure: LOOP ELECTROSURGICAL EXCISION PROCEDURE (LEEP);  Surgeon: Arloa Lamar SQUIBB, MD;  Location: [facility];  Service: Gynecology;  Laterality: N/A;   Social History   Occupational History   Not on file  Tobacco Use   Smoking status: Former    Current packs/day: 0.00    Average packs/day: 0.5 packs/day for 10.0 years (5.0 ttl pk-yrs)    Types: Cigarettes    Start date: 2005    Quit date: 2015    Years since quitting: 10.6   Smokeless tobacco: Never  Vaping Use   Vaping status: Every Day   Substances: Nicotine, Flavoring, Nicotine-salt   Devices: unsure name  Substance and Sexual Activity   Alcohol use: Yes    Comment: rarely   Drug use: Never   Sexual activity: Not Currently    Birth control/protection: Pill

## 2024-06-21 ENCOUNTER — Other Ambulatory Visit: Payer: Self-pay | Admitting: Family Medicine

## 2024-06-21 DIAGNOSIS — I10 Essential (primary) hypertension: Secondary | ICD-10-CM

## 2024-06-23 NOTE — Telephone Encounter (Signed)
 OV 04/14/24 Requested Prescriptions  Pending Prescriptions Disp Refills   atenolol  (TENORMIN ) 50 MG tablet [Pharmacy Med Name: ATENOLOL  50 MG TABLET] 90 tablet 0    Sig: TAKE 1 TABLET BY MOUTH EVERY DAY     Cardiovascular: Beta Blockers 2 Failed - 06/23/2024  2:38 PM      Failed - Cr in normal range and within 360 days    Creat  Date Value Ref Range Status  10/03/2021 0.91 0.50 - 0.99 mg/dL Final   Creatinine, Ser  Date Value Ref Range Status  07/25/2022 0.89 0.57 - 1.00 mg/dL Final   Creatinine, Urine  Date Value Ref Range Status  07/26/2021 43 20 - 275 mg/dL Final         Failed - Valid encounter within last 6 months    Recent Outpatient Visits           2 months ago Acute pain of right shoulder   [place] [facility] Duanne Butler DASEN, MD   8 months ago Polycythemia   [place] [facility] Duanne Butler DASEN, MD   10 months ago Insomnia, unspecified type   [place] [facility] Duanne, Butler DASEN, MD   1 year ago Strain of left hip, initial encounter   [place] [facility] Duanne Butler DASEN, MD   1 year ago Diarrhea due to malabsorption   [place] [facility] Pickard, Butler DASEN, MD       Future Appointments             In 3 weeks Addie, Cordella Hamilton, MD [facility]            Passed - Last BP in normal range    BP Readings from Last 1 Encounters:  04/14/24 108/82         Passed - Last Heart Rate in normal range    Pulse Readings from Last 1 Encounters:  04/14/24 88

## 2024-07-03 ENCOUNTER — Other Ambulatory Visit: Payer: Self-pay | Admitting: Neurology

## 2024-07-03 DIAGNOSIS — G35 Multiple sclerosis: Secondary | ICD-10-CM

## 2024-07-03 NOTE — Telephone Encounter (Signed)
 Last seen on 03/26/24 Follow up scheduled on 10/27/24

## 2024-07-06 DIAGNOSIS — J209 Acute bronchitis, unspecified: Secondary | ICD-10-CM | POA: Diagnosis not present

## 2024-07-06 DIAGNOSIS — R070 Pain in throat: Secondary | ICD-10-CM | POA: Diagnosis not present

## 2024-07-10 ENCOUNTER — Ambulatory Visit (INDEPENDENT_AMBULATORY_CARE_PROVIDER_SITE_OTHER): Admitting: Family Medicine

## 2024-07-10 ENCOUNTER — Encounter: Payer: Self-pay | Admitting: Family Medicine

## 2024-07-10 VITALS — BP 126/76 | HR 88 | Temp 98.1°F | Ht 64.0 in | Wt 223.0 lb

## 2024-07-10 DIAGNOSIS — J40 Bronchitis, not specified as acute or chronic: Secondary | ICD-10-CM

## 2024-07-10 MED ORDER — HYDROCODONE BIT-HOMATROP MBR 5-1.5 MG/5ML PO SOLN: 5.0000 mL | Freq: Three times a day (TID) | ORAL | 0 refills | Status: DC | PRN

## 2024-07-10 MED ORDER — PREDNISONE 20 MG PO TABS: ORAL_TABLET | ORAL | 0 refills | Status: DC

## 2024-07-10 MED ORDER — AZITHROMYCIN 250 MG PO TABS: ORAL_TABLET | ORAL | 0 refills | Status: DC

## 2024-07-10 NOTE — Progress Notes (Signed)
 Subjective:    Patient ID: Virginia [name], female    DOB: 1981-06-02, 43 y.o.   MRN: [id number] Symptoms began approximately 9 to 10 days ago.  Symptoms include a persistent dry nonproductive cough.  Occasional wheezing, head congestion and rhinorrhea.  She was seen at an urgent care where flu test and COVID test were negative.  She was given an inhaler.  She states the cough is no better.  She denies any fevers or chills.  She denies any purulent sputum.  She does have some mild pleurisy.  She denies any shortness of breath.  However the cough is severe and keeping her from sleeping.  She denies any sore throat.  She denies any sinus pain.   Past Medical History:  Diagnosis Date   Arthritis    Chronic migraine w/o aura w/o status migrainosus, not intractable    followed by dr vear   GAD (generalized anxiety disorder)    GERD (gastroesophageal reflux disease)    Hemorrhoids    History of cervical dysplasia    followed by GYN--- dr jonelle. harris;    hx CIN 2  s/p  colposcopy in office 05/ 2022 and CIN 3  s/p LEEP 11/ 2022   History of syncope 02/2016   admission in epic;  in setting of vertigo and dx w/ MS   Insomnia    Multiple sclerosis, relapsing-remitting (HCC) 02/2016   neurologist--- dr vear;  dx 05/ 2017 lumbar puncture,  dyesthesia, fatigue, vertigo   PAC (premature atrial contraction)    PSVT (paroxysmal supraventricular tachycardia) (HCC) 2019   (02-01-2023 pt stated followed by pcp) // cardiologist--- dr g. taylor;  (lov in epic 06-21-2018)  evaluated 2019 after dx MS in 2017 with syncope/ palpitations;   event monitor showed PSVT,  PACs/ PVCs   PVC (premature ventricular contraction)    Sleep apnea, unspecified    study in epic 08-07-2018  borderline sleep apnea,  recommendation loss wt, sleep hygiene, mouth guard   Past Surgical History:  Procedure Laterality Date   COLONOSCOPY WITH ESOPHAGOGASTRODUODENOSCOPY (EGD)  11/15/2022   dr legrand;   EVALUATION UNDER ANESTHESIA WITH  HEMORRHOIDECTOMY N/A 02/08/2023   Procedure: ANORECTAL EXAM UNDER ANESTHESIA;  Surgeon: Teresa Lonni HERO, MD;  Location: [facility];  Service: General;  Laterality: N/A;   HEMORRHOID SURGERY  02/08/2023   Procedure: EXTERNAL HEMORRHOIDECTOMY X2;  Surgeon: Teresa Lonni HERO, MD;  Location: [facility];  Service: General;;   LEEP N/A 09/20/2021   Procedure: LOOP ELECTROSURGICAL EXCISION PROCEDURE (LEEP);  Surgeon: Arloa Lamar SQUIBB, MD;  Location: [facility];  Service: Gynecology;  Laterality: N/A;   Current Outpatient Medications on File Prior to Visit  Medication Sig Dispense Refill   albuterol  (VENTOLIN  HFA) 108 (90 Base) MCG/ACT inhaler Inhale 2 puffs into the lungs every 4 (four) hours as needed.     atenolol  (TENORMIN ) 50 MG tablet TAKE 1 TABLET BY MOUTH EVERY DAY 90 tablet 0   baclofen  (LIORESAL ) 20 MG tablet TAKE 2 TABLETS BY MOUTH 3 TIMES A DAY FOR SPASTICITY 270 tablet 3   clonazePAM  (KLONOPIN ) 1 MG tablet Take 1 tablet (1 mg total) by mouth 2 (two) times daily as needed for anxiety. 30 tablet 3   doxylamine , Sleep, (UNISOM  SLEEPTABS) 25 MG tablet Take 2 tablets (50 mg total) by mouth at bedtime as needed. 30 tablet 0   drospirenone -ethinyl estradiol  (YAZ) 3-0.02 MG tablet Take 1 tablet by mouth daily. 84 tablet 2   DULoxetine  (CYMBALTA ) 60  MG capsule TAKE 1 CAPSULE BY MOUTH EVERY DAY 90 capsule 0   gabapentin  (NEURONTIN ) 600 MG tablet Take 1 tablet (600 mg total) by mouth 4 (four) times daily. 360 tablet 1   KESIMPTA  20 MG/0.4ML SOAJ INJECT 1 PEN UNDER THE SKIN EVERY MONTH 0.4 mL 4   meclizine  (ANTIVERT ) 25 MG tablet Take 1 tablet (25 mg total) by mouth 3 (three) times daily as needed for dizziness. 30 tablet 11   pantoprazole  (PROTONIX ) 40 MG tablet Take 1 tablet (40 mg total) by mouth 2 (two) times daily. 180 tablet 0   SUMAtriptan  (IMITREX ) 50 MG tablet TAKE 1 TABLET EVERY 2 HRS AS NEEDED FOR MIGRAINE. MAY REPEAT IN 2 HRS IF HEADACHE PERSISTS OR RECURS  10 tablet 1   tiZANidine  (ZANAFLEX ) 4 MG tablet One po bid 180 tablet 3   etodolac  (LODINE ) 400 MG tablet Take 1 tablet (400 mg total) by mouth 2 (two) times daily. (Patient not taking: Reported on 07/10/2024) 180 tablet 1   Semaglutide -Weight Management (WEGOVY ) 0.5 MG/0.5ML SOAJ Inject 0.5 mg into the skin once a week. (Patient not taking: Reported on 07/10/2024) 2 mL 1   No current facility-administered medications on file prior to visit.   Allergies  Allergen Reactions   Codeine Other (See Comments)    Hallucinations    Onion Swelling   Topamax  [Topiramate ]     Was talking out of her head, walked into walls   Social History   Socioeconomic History   Marital status: Married    Spouse name: Not on file   Number of children: Not on file   Years of education: Not on file   Highest education level: 12th grade  Occupational History   Not on file  Tobacco Use   Smoking status: Former    Current packs/day: 0.00    Average packs/day: 0.5 packs/day for 10.0 years (5.0 ttl pk-yrs)    Types: Cigarettes    Start date: 2005    Quit date: 2015    Years since quitting: 10.7   Smokeless tobacco: Never  Vaping Use   Vaping status: Every Day   Substances: Nicotine, Flavoring, Nicotine-salt   Devices: unsure name  Substance and Sexual Activity   Alcohol use: Yes    Comment: rarely   Drug use: Never   Sexual activity: Not Currently    Birth control/protection: Pill  Other Topics Concern   Not on file  Social History Narrative   Not on file   Social Drivers of Health   Financial Resource Strain: Medium Risk (04/11/2024)   Overall Financial Resource Strain (CARDIA)    Difficulty of Paying Living Expenses: Somewhat hard  Food Insecurity: Patient Declined (04/11/2024)   Hunger Vital Sign    Worried About Running Out of Food in the Last Year: Patient declined    Ran Out of Food in the Last Year: Patient declined  Transportation Needs: No Transportation Needs (04/11/2024)   PRAPARE -  Administrator, Civil Service (Medical): No    Lack of Transportation (Non-Medical): No  Physical Activity: Unknown (04/11/2024)   Exercise Vital Sign    Days of Exercise per Week: Patient declined    Minutes of Exercise per Session: Not on file  Stress: Stress Concern Present (04/11/2024)   [employer] of Occupational Health - Occupational Stress Questionnaire    Feeling of Stress: Rather much  Social Connections: Moderately Isolated (04/11/2024)   Social Connection and Isolation Panel    Frequency of Communication with Friends  and Family: More than three times a week    Frequency of Social Gatherings with Friends and Family: Twice a week    Attends Religious Services: Patient declined    Database administrator or Organizations: No    Attends Engineer, structural: Not on file    Marital Status: Married  Catering manager Violence: Not on file      Review of Systems  All other systems reviewed and are negative.      Objective:   Physical Exam Constitutional:      Appearance: Normal appearance. She is obese.  HENT:     Right Ear: Tympanic membrane and ear canal normal.     Left Ear: Tympanic membrane and ear canal normal.     Nose: Rhinorrhea present.     Mouth/Throat:     Pharynx: No oropharyngeal exudate or posterior oropharyngeal erythema.  Cardiovascular:     Rate and Rhythm: Normal rate and regular rhythm.     Heart sounds: Normal heart sounds. No murmur heard. Pulmonary:     Effort: Pulmonary effort is normal. No respiratory distress.     Breath sounds: No stridor. Wheezing present. No rhonchi or rales.  Musculoskeletal:     Right shoulder: Tenderness present. No swelling, deformity or effusion. Decreased range of motion. Normal strength.  Lymphadenopathy:     Cervical: No cervical adenopathy.  Neurological:     General: No focal deficit present.     Mental Status: She is alert and oriented to person, place, and time. Mental status is at  baseline.     Cranial Nerves: No cranial nerve deficit.         Assessment & Plan:  Bronchitis I believe the patient has bronchitis due to the wheezing and most likely viral bronchitis.  Recommended prednisone  in addition to the albuterol .  Gave the patient Hycodan to use 1 teaspoon every 6 hours as needed for cough.  I gave her a prescription for a Z-Pak but I instructed her not to fill the prescription unless she develops fever greater than 100.3 and purulent sputum

## 2024-07-14 ENCOUNTER — Ambulatory Visit: Admitting: Orthopedic Surgery

## 2024-07-16 ENCOUNTER — Encounter: Payer: Self-pay | Admitting: Family Medicine

## 2024-07-17 ENCOUNTER — Other Ambulatory Visit: Payer: Self-pay | Admitting: Family Medicine

## 2024-07-17 MED ORDER — AZITHROMYCIN 250 MG PO TABS: ORAL_TABLET | ORAL | 0 refills | Status: DC

## 2024-07-24 ENCOUNTER — Other Ambulatory Visit: Payer: Self-pay | Admitting: Obstetrics and Gynecology

## 2024-07-24 DIAGNOSIS — N921 Excessive and frequent menstruation with irregular cycle: Secondary | ICD-10-CM

## 2024-07-24 DIAGNOSIS — Z3041 Encounter for surveillance of contraceptive pills: Secondary | ICD-10-CM

## 2024-07-24 DIAGNOSIS — D219 Benign neoplasm of connective and other soft tissue, unspecified: Secondary | ICD-10-CM

## 2024-08-01 ENCOUNTER — Other Ambulatory Visit: Payer: Self-pay | Admitting: Family Medicine

## 2024-08-01 DIAGNOSIS — G4700 Insomnia, unspecified: Secondary | ICD-10-CM

## 2024-08-04 NOTE — Telephone Encounter (Signed)
 Requested medication (s) are due for refill today: yes  Requested medication (s) are on the active medication list: yes  Last refill:  02/11/24  Future visit scheduled: no  Notes to clinic:  Unable to refill per protocol, cannot delegate.      Requested Prescriptions  Pending Prescriptions Disp Refills   clonazePAM  (KLONOPIN ) 1 MG tablet [Pharmacy Med Name: CLONAZEPAM  1 MG TABLET] 30 tablet 3    Sig: TAKE 1 TABLET BY MOUTH 2 TIMES DAILY AS NEEDED FOR ANXIETY.     Not Delegated - Psychiatry: Anxiolytics/Hypnotics 2 Failed - 08/04/2024 10:48 AM      Failed - This refill cannot be delegated      Failed - Urine Drug Screen completed in last 360 days      Passed - Patient is not pregnant      Passed - Valid encounter within last 6 months    Recent Outpatient Visits           3 weeks ago Bronchitis   [place] [facility] Duanne Butler DASEN, MD   3 months ago Acute pain of right shoulder   [place] [facility] Duanne, Butler DASEN, MD   10 months ago Polycythemia   [place] [facility] Duanne Butler DASEN, MD   11 months ago Insomnia, unspecified type   [place] [facility] Pickard, Butler DASEN, MD   1 year ago Strain of left hip, initial encounter   [place] [facility] Pickard, Butler DASEN, MD

## 2024-08-13 ENCOUNTER — Other Ambulatory Visit: Payer: Self-pay | Admitting: Family Medicine

## 2024-08-13 DIAGNOSIS — F410 Panic disorder [episodic paroxysmal anxiety] without agoraphobia: Secondary | ICD-10-CM

## 2024-08-13 DIAGNOSIS — F320 Major depressive disorder, single episode, mild: Secondary | ICD-10-CM

## 2024-08-19 NOTE — Progress Notes (Deleted)
 PCP:  Duanne Butler DASEN, MD   No chief complaint on file.    HPI:      [name] is a 43 y.o. G1P1001 whose LMP was No LMP recorded., presents today for her annual examination.  Her menses are regular every 28-30 days, lasting 7 days, mod flow with spotting for 7 days either before or after period OR spotting only for 2 wks; on OCPs.  Dysmenorrhea mild, improved with NSAIDs. Had GYN u/s last yr due to BTB on OCPs and found to have 3 small leio and normal thyroid  labs; recommended staying on OCPs since sx only a couple months at that time. Sx have persisted. Pt would like to stay on OCPs and declines other BC. Pt started on OCPs a few yrs ago for BTB with Dr. Arloa. No hx of HTN, DVTs, migraines with aura, tob use.   08/24/22 GYN u/s results:  Fibroid 1: ? Submucosal vs intramural w/submucosal extension 11 x 14mm Fibroid 2: Subserosal, Right Posterior Fundal, 7 x 5mm Fibroid 3: Intramural, Posterior Fundal, 10 x 10mm  Sex activity: not sexually active. On OCPs for cycle control. No vag sx.  Last Pap: 08/14/23 Results were normal, neg HPV DNA 2023; repeat due today 01/18/22  Results were: no abnormalities  09/20/21 LEEP with CIN 2 and 3 with Dr. Harris 08/31/21 ASC-H  5/22 Cryotx 02/28/21 colpo bx with CIN 2 01/31/21  LSGIL/pos HPV DNA  Last mammogram: 10/12/23 Results were: normal--routine follow-up in 12 months There is a FH of breast cancer in 3 mat grt aunts, genetic testing not done. There is no FH of ovarian cancer. The patient does occas do self-breast exams.  Tobacco use: none Alcohol use: occas No drug use.  Exercise: moderately active  She does get adequate calcium and some Vitamin D  in her diet. Normal fasting labs 9/23   Patient Active Problem List   Diagnosis Date Noted   Body mass index (BMI) 34.0-34.9, adult 09/05/2023   Leiomyoma 08/14/2023   Family history of breast cancer 07/25/2022   Chronic migraine w/o aura w/o status migrainosus, not intractable  09/01/2021   Dysplasia of cervix, high grade CIN 2 03/18/2021   LGSIL on Pap smear of cervix 02/28/2021   Vertigo of central origin 06/24/2020   Numbness 06/24/2020   Panic disorder 11/11/2019   Current mild episode of major depressive disorder without prior episode 11/11/2019   Snoring 06/20/2018   Excessive daytime sleepiness 06/20/2018   Neck pain 01/01/2018   Palpitations 01/01/2018   Myalgia 09/03/2017   Chronic low back pain without sciatica 09/03/2017   Right leg pain 07/26/2017   Bilateral sciatica 07/12/2017   Nystagmus 02/15/2017   Spell of altered consciousness 07/19/2016   Ataxic gait 05/31/2016   Other fatigue 05/31/2016   Dysesthesia 05/31/2016   High risk medication use 03/31/2016   Insomnia 03/31/2016   Dizziness    Syncope    Multiple sclerosis 03/21/2016   Relapsing remitting multiple sclerosis    Vertigo    White matter abnormality on MRI of brain 03/20/2016   Faintness     Past Surgical History:  Procedure Laterality Date   COLONOSCOPY WITH ESOPHAGOGASTRODUODENOSCOPY (EGD)  11/15/2022   dr legrand;   EVALUATION UNDER ANESTHESIA WITH HEMORRHOIDECTOMY N/A 02/08/2023   Procedure: ANORECTAL EXAM UNDER ANESTHESIA;  Surgeon: Teresa Lonni HERO, MD;  Location: [facility];  Service: General;  Laterality: N/A;   HEMORRHOID SURGERY  02/08/2023   Procedure: EXTERNAL HEMORRHOIDECTOMY X2;  Surgeon: Teresa,  Lonni HERO, MD;  Location: [facility];  Service: General;;   LEEP N/A 09/20/2021   Procedure: LOOP ELECTROSURGICAL EXCISION PROCEDURE (LEEP);  Surgeon: Arloa Lamar SQUIBB, MD;  Location: [facility];  Service: Gynecology;  Laterality: N/A;    Family History  Problem Relation Age of Onset   Emphysema Mother    Osteoarthritis Mother    Anxiety disorder Mother    Uterine cancer Mother 54   Other Father    Suicidality Father    Breast cancer Maternal Aunt    Breast cancer Maternal Aunt    Breast cancer Maternal Aunt    ALS Maternal  Grandmother    Colon cancer Neg Hx    Colon polyps Neg Hx    Esophageal cancer Neg Hx    Rectal cancer Neg Hx    Stomach cancer Neg Hx     Social History   Socioeconomic History   Marital status: Married    Spouse name: Not on file   Number of children: Not on file   Years of education: Not on file   Highest education level: 12th grade  Occupational History   Not on file  Tobacco Use   Smoking status: Former    Current packs/day: 0.00    Average packs/day: 0.5 packs/day for 10.0 years (5.0 ttl pk-yrs)    Types: Cigarettes    Start date: 2005    Quit date: 2015    Years since quitting: 10.8   Smokeless tobacco: Never  Vaping Use   Vaping status: Every Day   Substances: Nicotine, Flavoring, Nicotine-salt   Devices: unsure name  Substance and Sexual Activity   Alcohol use: Yes    Comment: rarely   Drug use: Never   Sexual activity: Not Currently    Birth control/protection: Pill  Other Topics Concern   Not on file  Social History Narrative   Not on file   Social Drivers of Health   Financial Resource Strain: Medium Risk (04/11/2024)   Overall Financial Resource Strain (CARDIA)    Difficulty of Paying Living Expenses: Somewhat hard  Food Insecurity: Patient Declined (04/11/2024)   Hunger Vital Sign    Worried About Running Out of Food in the Last Year: Patient declined    Ran Out of Food in the Last Year: Patient declined  Transportation Needs: No Transportation Needs (04/11/2024)   PRAPARE - Administrator, Civil Service (Medical): No    Lack of Transportation (Non-Medical): No  Physical Activity: Unknown (04/11/2024)   Exercise Vital Sign    Days of Exercise per Week: Patient declined    Minutes of Exercise per Session: Not on file  Stress: Stress Concern Present (04/11/2024)   [employer] of Occupational Health - Occupational Stress Questionnaire    Feeling of Stress: Rather much  Social Connections: Moderately Isolated (04/11/2024)   Social  Connection and Isolation Panel    Frequency of Communication with Friends and Family: More than three times a week    Frequency of Social Gatherings with Friends and Family: Twice a week    Attends Religious Services: Patient declined    Database administrator or Organizations: No    Attends Engineer, structural: Not on file    Marital Status: Married  Catering manager Violence: Not on file     Current Outpatient Medications:    albuterol  (VENTOLIN  HFA) 108 (90 Base) MCG/ACT inhaler, Inhale 2 puffs into the lungs every 4 (four) hours as needed., Disp: , Rfl:  atenolol  (TENORMIN ) 50 MG tablet, TAKE 1 TABLET BY MOUTH EVERY DAY, Disp: 90 tablet, Rfl: 0   azithromycin  (ZITHROMAX ) 250 MG tablet, 2 tabs poqday1, 1 tab poqday 2-5, Disp: 6 tablet, Rfl: 0   azithromycin  (ZITHROMAX ) 250 MG tablet, 2 tabs poqday1, 1 tab poqday 2-5, Disp: 6 tablet, Rfl: 0   baclofen  (LIORESAL ) 20 MG tablet, TAKE 2 TABLETS BY MOUTH 3 TIMES A DAY FOR SPASTICITY, Disp: 270 tablet, Rfl: 3   clonazePAM  (KLONOPIN ) 1 MG tablet, TAKE 1 TABLET BY MOUTH 2 TIMES DAILY AS NEEDED FOR ANXIETY., Disp: 30 tablet, Rfl: 3   doxylamine , Sleep, (UNISOM  SLEEPTABS) 25 MG tablet, Take 2 tablets (50 mg total) by mouth at bedtime as needed., Disp: 30 tablet, Rfl: 0   drospirenone -ethinyl estradiol  (NIKKI ) 3-0.02 MG tablet, TAKE 1 TABLET BY MOUTH EVERY DAY, Disp: 84 tablet, Rfl: 0   DULoxetine  (CYMBALTA ) 60 MG capsule, TAKE 1 CAPSULE BY MOUTH EVERY DAY, Disp: 90 capsule, Rfl: 0   etodolac  (LODINE ) 400 MG tablet, Take 1 tablet (400 mg total) by mouth 2 (two) times daily. (Patient not taking: Reported on 07/10/2024), Disp: 180 tablet, Rfl: 1   gabapentin  (NEURONTIN ) 600 MG tablet, Take 1 tablet (600 mg total) by mouth 4 (four) times daily., Disp: 360 tablet, Rfl: 1   HYDROcodone  bit-homatropine (HYCODAN) 5-1.5 MG/5ML syrup, Take 5 mLs by mouth every 8 (eight) hours as needed for cough., Disp: 120 mL, Rfl: 0   KESIMPTA  20 MG/0.4ML SOAJ,  INJECT 1 PEN UNDER THE SKIN EVERY MONTH, Disp: 0.4 mL, Rfl: 4   meclizine  (ANTIVERT ) 25 MG tablet, Take 1 tablet (25 mg total) by mouth 3 (three) times daily as needed for dizziness., Disp: 30 tablet, Rfl: 11   pantoprazole  (PROTONIX ) 40 MG tablet, Take 1 tablet (40 mg total) by mouth 2 (two) times daily., Disp: 180 tablet, Rfl: 0   predniSONE  (DELTASONE ) 20 MG tablet, 3 tabs poqday 1-2, 2 tabs poqday 3-4, 1 tab poqday 5-6, Disp: 12 tablet, Rfl: 0   Semaglutide -Weight Management (WEGOVY ) 0.5 MG/0.5ML SOAJ, Inject 0.5 mg into the skin once a week. (Patient not taking: Reported on 07/10/2024), Disp: 2 mL, Rfl: 1   SUMAtriptan  (IMITREX ) 50 MG tablet, TAKE 1 TABLET EVERY 2 HRS AS NEEDED FOR MIGRAINE. MAY REPEAT IN 2 HRS IF HEADACHE PERSISTS OR RECURS, Disp: 10 tablet, Rfl: 1   tiZANidine  (ZANAFLEX ) 4 MG tablet, One po bid, Disp: 180 tablet, Rfl: 3     ROS:  Review of Systems  Constitutional:  Negative for fatigue, fever and unexpected weight change.  Respiratory:  Negative for cough, shortness of breath and wheezing.   Cardiovascular:  Negative for chest pain, palpitations and leg swelling.  Gastrointestinal:  Negative for blood in stool, constipation, diarrhea, nausea and vomiting.  Endocrine: Negative for cold intolerance, heat intolerance and polyuria.  Genitourinary:  Positive for menstrual problem. Negative for dyspareunia, dysuria, flank pain, frequency, genital sores, hematuria, pelvic pain, urgency, vaginal bleeding, vaginal discharge and vaginal pain.  Musculoskeletal:  Negative for back pain, joint swelling and myalgias.  Skin:  Negative for rash.  Neurological:  Negative for dizziness, syncope, light-headedness, numbness and headaches.  Hematological:  Negative for adenopathy.  Psychiatric/Behavioral:  Negative for agitation, confusion, sleep disturbance and suicidal ideas. The patient is not nervous/anxious.    BREAST: No symptoms   Objective: There were no vitals taken for this  visit.   Physical Exam Constitutional:      Appearance: She is well-developed.  Genitourinary:  Vulva normal.     Right Labia: No rash, tenderness or lesions.    Left Labia: No tenderness, lesions or rash.    No vaginal discharge, erythema or tenderness.      Right Adnexa: not tender and no mass present.    Left Adnexa: not tender and no mass present.    No cervical friability or polyp.     Uterus is not enlarged or tender.  Breasts:    Right: No mass, nipple discharge, skin change or tenderness.     Left: No mass, nipple discharge, skin change or tenderness.  Neck:     Thyroid : No thyromegaly.  Cardiovascular:     Rate and Rhythm: Normal rate and regular rhythm.     Heart sounds: Normal heart sounds. No murmur heard. Pulmonary:     Effort: Pulmonary effort is normal.     Breath sounds: Normal breath sounds.  Abdominal:     Palpations: Abdomen is soft.     Tenderness: There is no abdominal tenderness. There is no guarding or rebound.  Musculoskeletal:        General: Normal range of motion.     Cervical back: Normal range of motion.  Lymphadenopathy:     Cervical: No cervical adenopathy.  Neurological:     General: No focal deficit present.     Mental Status: She is alert and oriented to person, place, and time.     Cranial Nerves: No cranial nerve deficit.  Skin:    General: Skin is warm and dry.  Psychiatric:        Mood and Affect: Mood normal.        Behavior: Behavior normal.        Thought Content: Thought content normal.        Judgment: Judgment normal.  Vitals reviewed.     Assessment/Plan: Encounter for annual routine gynecological examination  Cervical cancer screening - Plan: Cytology - PAP  Screening for HPV (human papillomavirus) - Plan: Cytology - PAP  Dysplasia of cervix, high grade CIN 2 - Plan: Cytology - PAP; repeat pap today.   Encounter for surveillance of contraceptive pills--OCP change due to persistent BTB, hx of leio. Rx yaz, f/u  in 3 months re: sx/sooner prn.   Encounter for screening mammogram for malignant neoplasm of breast; pt to schedule mammo  Family history of breast cancer--MyRisk testing discussed and pt to f/u if desires.   Leiomyoma--3 small fibroids on GYN u/s.    No orders of the defined types were placed in this encounter.             GYN counsel breast self exam, mammography screening, adequate intake of calcium and vitamin D , diet and exercise     F/U  No follow-ups on file.  Parks Czajkowski B. Pelham Hennick, PA-C 08/19/2024 5:15 PM

## 2024-08-20 ENCOUNTER — Ambulatory Visit: Admitting: Obstetrics and Gynecology

## 2024-08-20 DIAGNOSIS — Z124 Encounter for screening for malignant neoplasm of cervix: Secondary | ICD-10-CM

## 2024-08-20 DIAGNOSIS — Z1151 Encounter for screening for human papillomavirus (HPV): Secondary | ICD-10-CM

## 2024-08-20 DIAGNOSIS — Z1231 Encounter for screening mammogram for malignant neoplasm of breast: Secondary | ICD-10-CM

## 2024-08-20 DIAGNOSIS — Z01419 Encounter for gynecological examination (general) (routine) without abnormal findings: Secondary | ICD-10-CM

## 2024-08-20 DIAGNOSIS — Z9889 Other specified postprocedural states: Secondary | ICD-10-CM

## 2024-08-27 ENCOUNTER — Ambulatory Visit (INDEPENDENT_AMBULATORY_CARE_PROVIDER_SITE_OTHER): Admitting: Orthopedic Surgery

## 2024-08-27 ENCOUNTER — Encounter: Payer: Self-pay | Admitting: Orthopedic Surgery

## 2024-08-27 ENCOUNTER — Other Ambulatory Visit: Payer: Self-pay

## 2024-08-27 DIAGNOSIS — M19011 Primary osteoarthritis, right shoulder: Secondary | ICD-10-CM

## 2024-08-27 DIAGNOSIS — M25511 Pain in right shoulder: Secondary | ICD-10-CM | POA: Diagnosis not present

## 2024-08-27 NOTE — Progress Notes (Unsigned)
 Office Visit Note   Patient: Virginia [name]           Date of Birth: 05/14/1981           MRN: [id number] Visit Date: 08/27/2024 Requested by: Duanne Butler DASEN, MD [address] [place],  [place] [address] PCP: Duanne Butler DASEN, MD  Subjective: Chief Complaint  Patient presents with  . Right Shoulder - Follow-up    HPI: Rogenia Cobey is a 43 y.o. female who presents to the office reporting ***.                ROS: All systems reviewed are negative as they relate to the chief complaint within the history of present illness.  Patient denies fevers or chills.  Assessment & Plan: Visit Diagnoses:  1. Right shoulder pain, unspecified chronicity   2. Arthritis of right acromioclavicular joint     Plan: ***  Follow-Up Instructions: No follow-ups on file.   Orders:  Orders Placed This Encounter  Procedures  . US  Guided Needle Placement - No Linked Charges   No orders of the defined types were placed in this encounter.     Procedures: Medium Joint Inj: R acromioclavicular on 08/27/2024 3:35 PM Indications: diagnostic evaluation and pain Details: 25 G 1.5 in needle, ultrasound-guided superior approach Medications: 3 mL lidocaine  1 %; 0.66 mL bupivacaine  0.25 %; 13 mg triamcinolone  acetonide 40 MG/ML Outcome: tolerated well, no immediate complications Procedure, treatment alternatives, risks and benefits explained, specific risks discussed. Consent was given by the patient. Immediately prior to procedure a time out was called to verify the correct patient, procedure, equipment, support staff and site/side marked as required. Patient was prepped and draped in the usual sterile fashion.       Clinical Data: No additional findings.  Objective: Vital Signs: There were no vitals taken for this visit.  Physical Exam:  Constitutional: Patient appears well-developed HEENT:  Head: Normocephalic Eyes:EOM are normal Neck: Normal range of motion Cardiovascular: Normal  rate Pulmonary/chest: Effort normal Neurologic: Patient is alert Skin: Skin is warm Psychiatric: Patient has normal mood and affect  Ortho Exam: ***  Specialty Comments:  No specialty comments available.  Imaging: No results found.   PMFS History: Patient Active Problem List   Diagnosis Date Noted  . Body mass index (BMI) 34.0-34.9, adult 09/05/2023  . Leiomyoma 08/14/2023  . Family history of breast cancer 07/25/2022  . Chronic migraine w/o aura w/o status migrainosus, not intractable 09/01/2021  . Dysplasia of cervix, high grade CIN 2 03/18/2021  . LGSIL on Pap smear of cervix 02/28/2021  . Vertigo of central origin 06/24/2020  . Numbness 06/24/2020  . Panic disorder 11/11/2019  . Current mild episode of major depressive disorder without prior episode 11/11/2019  . Snoring 06/20/2018  . Excessive daytime sleepiness 06/20/2018  . Neck pain 01/01/2018  . Palpitations 01/01/2018  . Myalgia 09/03/2017  . Chronic low back pain without sciatica 09/03/2017  . Right leg pain 07/26/2017  . Bilateral sciatica 07/12/2017  . Nystagmus 02/15/2017  . Spell of altered consciousness 07/19/2016  . Ataxic gait 05/31/2016  . Other fatigue 05/31/2016  . Dysesthesia 05/31/2016  . High risk medication use 03/31/2016  . Insomnia 03/31/2016  . Dizziness   . Syncope   . Multiple sclerosis 03/21/2016  . Relapsing remitting multiple sclerosis   . Vertigo   . White matter abnormality on MRI of brain 03/20/2016  . Faintness    Past Medical History:  Diagnosis Date  .  Arthritis   . Chronic migraine w/o aura w/o status migrainosus, not intractable    followed by dr vear  . GAD (generalized anxiety disorder)   . GERD (gastroesophageal reflux disease)   . Hemorrhoids   . History of cervical dysplasia    followed by GYN--- dr jonelle. harris;    hx CIN 2  s/p  colposcopy in office 05/ 2022 and CIN 3  s/p LEEP 11/ 2022  . History of syncope 02/2016   admission in epic;  in setting of vertigo  and dx w/ MS  . Insomnia   . Multiple sclerosis, relapsing-remitting 02/2016   neurologist--- dr vear;  dx 05/ 2017 lumbar puncture,  dyesthesia, fatigue, vertigo  . PAC (premature atrial contraction)   . PSVT (paroxysmal supraventricular tachycardia) 2019   (02-01-2023 pt stated followed by pcp) // cardiologist--- dr g. taylor;  (lov in epic 06-21-2018)  evaluated 2019 after dx MS in 2017 with syncope/ palpitations;   event monitor showed PSVT,  PACs/ PVCs  . PVC (premature ventricular contraction)   . Sleep apnea, unspecified    study in epic 08-07-2018  borderline sleep apnea,  recommendation loss wt, sleep hygiene, mouth guard    Family History  Problem Relation Age of Onset  . Emphysema Mother   . Osteoarthritis Mother   . Anxiety disorder Mother   . Uterine cancer Mother 54  . Other Father   . Suicidality Father   . Breast cancer Maternal Aunt   . Breast cancer Maternal Aunt   . Breast cancer Maternal Aunt   . ALS Maternal Grandmother   . Colon cancer Neg Hx   . Colon polyps Neg Hx   . Esophageal cancer Neg Hx   . Rectal cancer Neg Hx   . Stomach cancer Neg Hx     Past Surgical History:  Procedure Laterality Date  . COLONOSCOPY WITH ESOPHAGOGASTRODUODENOSCOPY (EGD)  11/15/2022   dr legrand;  . EVALUATION UNDER ANESTHESIA WITH HEMORRHOIDECTOMY N/A 02/08/2023   Procedure: ANORECTAL EXAM UNDER ANESTHESIA;  Surgeon: Teresa Lonni HERO, MD;  Location: [facility];  Service: General;  Laterality: N/A;  . HEMORRHOID SURGERY  02/08/2023   Procedure: EXTERNAL HEMORRHOIDECTOMY X2;  Surgeon: Teresa Lonni HERO, MD;  Location: [facility];  Service: General;;  . LEEP N/A 09/20/2021   Procedure: LOOP ELECTROSURGICAL EXCISION PROCEDURE (LEEP);  Surgeon: Arloa Lamar SQUIBB, MD;  Location: [facility];  Service: Gynecology;  Laterality: N/A;   Social History   Occupational History  . Not on file  Tobacco Use  . Smoking status: Former    Current packs/day:  0.00    Average packs/day: 0.5 packs/day for 10.0 years (5.0 ttl pk-yrs)    Types: Cigarettes    Start date: 2005    Quit date: 2015    Years since quitting: 10.8  . Smokeless tobacco: Never  Vaping Use  . Vaping status: Every Day  . Substances: Nicotine, Flavoring, Nicotine-salt  . Devices: unsure name  Substance and Sexual Activity  . Alcohol use: Yes    Comment: rarely  . Drug use: Never  . Sexual activity: Not Currently    Birth control/protection: Pill

## 2024-08-28 ENCOUNTER — Other Ambulatory Visit: Payer: Self-pay | Admitting: Obstetrics and Gynecology

## 2024-08-28 DIAGNOSIS — Z1231 Encounter for screening mammogram for malignant neoplasm of breast: Secondary | ICD-10-CM

## 2024-08-28 MED ORDER — BUPIVACAINE HCL 0.25 % IJ SOLN
0.6600 mL | INTRAMUSCULAR | Status: AC | PRN
  Administered 2024-08-27: .66 mL via INTRA_ARTICULAR

## 2024-08-28 MED ORDER — TRIAMCINOLONE ACETONIDE 40 MG/ML IJ SUSP
13.0000 mg | INTRAMUSCULAR | Status: AC | PRN
  Administered 2024-08-27: 13 mg via INTRA_ARTICULAR

## 2024-08-28 MED ORDER — LIDOCAINE HCL 1 % IJ SOLN
3.0000 mL | INTRAMUSCULAR | Status: AC | PRN
  Administered 2024-08-27: 3 mL

## 2024-09-01 ENCOUNTER — Encounter: Payer: Self-pay | Admitting: Radiology

## 2024-09-01 NOTE — Progress Notes (Unsigned)
 PCP:  Duanne Butler DASEN, MD   No chief complaint on file.    HPI:      [name] is a 43 y.o. G1P1001 whose LMP was No LMP recorded., presents today for her annual examination.  Her menses are regular every 28-30 days, lasting 7 days, mod flow with spotting for 7 days either before or after period OR spotting only for 2 wks; on OCPs.  Dysmenorrhea mild, improved with NSAIDs. Had GYN u/s last yr due to BTB on OCPs and found to have 3 small leio and normal thyroid  labs; recommended staying on OCPs since sx only a couple months at that time. Sx have persisted. Pt would like to stay on OCPs and declines other BC. Pt started on OCPs a few yrs ago for BTB with Dr. Arloa. No hx of HTN, DVTs, migraines with aura, tob use.   08/24/22 GYN u/s results:  Fibroid 1: ? Submucosal vs intramural w/submucosal extension 11 x 14mm Fibroid 2: Subserosal, Right Posterior Fundal, 7 x 5mm Fibroid 3: Intramural, Posterior Fundal, 10 x 10mm  Sex activity: not sexually active. On OCPs for cycle control. No vag sx.  Last Pap: 08/14/23 Results were normal, neg HPV DNA 2023; repeat due today 01/18/22  Results were: no abnormalities  09/20/21 LEEP with CIN 2 and 3 with Dr. Harris 08/31/21 [facility]  5/22 Cryotx 02/28/21 colpo bx with CIN 2 01/31/21  LSGIL/pos HPV DNA  Last mammogram: 10/12/23 Results were: normal--routine follow-up in 12 months There is a FH of breast cancer in 3 mat grt aunts, genetic testing not done. There is no FH of ovarian cancer. The patient does occas do self-breast exams.  Tobacco use: none Alcohol use: occas No drug use.  Exercise: moderately active  She does get adequate calcium and some Vitamin D  in her diet. Normal fasting labs 9/23   Patient Active Problem List   Diagnosis Date Noted   Body mass index (BMI) 34.0-34.9, adult 09/05/2023   Leiomyoma 08/14/2023   Family history of breast cancer 07/25/2022   Chronic migraine w/o aura w/o status migrainosus, not intractable  09/01/2021   Dysplasia of cervix, high grade CIN 2 03/18/2021   LGSIL on Pap smear of cervix 02/28/2021   Vertigo of central origin 06/24/2020   Numbness 06/24/2020   Panic disorder 11/11/2019   Current mild episode of major depressive disorder without prior episode 11/11/2019   Snoring 06/20/2018   Excessive daytime sleepiness 06/20/2018   Neck pain 01/01/2018   Palpitations 01/01/2018   Myalgia 09/03/2017   Chronic low back pain without sciatica 09/03/2017   Right leg pain 07/26/2017   Bilateral sciatica 07/12/2017   Nystagmus 02/15/2017   Spell of altered consciousness 07/19/2016   Ataxic gait 05/31/2016   Other fatigue 05/31/2016   Dysesthesia 05/31/2016   High risk medication use 03/31/2016   Insomnia 03/31/2016   Dizziness    Syncope    Multiple sclerosis 03/21/2016   Relapsing remitting multiple sclerosis    Vertigo    White matter abnormality on MRI of brain 03/20/2016   Faintness     Past Surgical History:  Procedure Laterality Date   COLONOSCOPY WITH ESOPHAGOGASTRODUODENOSCOPY (EGD)  11/15/2022   dr legrand;   EVALUATION UNDER ANESTHESIA WITH HEMORRHOIDECTOMY N/A 02/08/2023   Procedure: ANORECTAL EXAM UNDER ANESTHESIA;  Surgeon: Teresa Lonni HERO, MD;  Location[facility];  Service: General;  Laterality: N/A;   HEMORRHOID SURGERY  02/08/2023   Procedure: EXTERNAL HEMORRHOIDECTOMY X2;  Surgeon: Teresa,  Lonni HERO, MD;  Location: [facility];  Service: General;;   LEEP N/A 09/20/2021   Procedure: LOOP ELECTROSURGICAL EXCISION PROCEDURE (LEEP);  Surgeon: Arloa Lamar SQUIBB, MD;  Location: [facility];  Service: Gynecology;  Laterality: N/A;    Family History  Problem Relation Age of Onset   Emphysema Mother    Osteoarthritis Mother    Anxiety disorder Mother    Uterine cancer Mother 25   Other Father    Suicidality Father    Breast cancer Maternal Aunt    Breast cancer Maternal Aunt    Breast cancer Maternal Aunt    ALS Maternal  Grandmother    Colon cancer Neg Hx    Colon polyps Neg Hx    Esophageal cancer Neg Hx    Rectal cancer Neg Hx    Stomach cancer Neg Hx     Social History   Socioeconomic History   Marital status: Married    Spouse name: Not on file   Number of children: Not on file   Years of education: Not on file   Highest education level: 12th grade  Occupational History   Not on file  Tobacco Use   Smoking status: Former    Current packs/day: 0.00    Average packs/day: 0.5 packs/day for 10.0 years (5.0 ttl pk-yrs)    Types: Cigarettes    Start date: 2005    Quit date: 2015    Years since quitting: 10.8   Smokeless tobacco: Never  Vaping Use   Vaping status: Every Day   Substances: Nicotine, Flavoring, Nicotine-salt   Devices: unsure name  Substance and Sexual Activity   Alcohol use: Yes    Comment: rarely   Drug use: Never   Sexual activity: Not Currently    Birth control/protection: Pill  Other Topics Concern   Not on file  Social History Narrative   Not on file   Social Drivers of Health   Financial Resource Strain: Medium Risk (04/11/2024)   Overall Financial Resource Strain (CARDIA)    Difficulty of Paying Living Expenses: Somewhat hard  Food Insecurity: Patient Declined (04/11/2024)   Hunger Vital Sign    Worried About Running Out of Food in the Last Year: Patient declined    Ran Out of Food in the Last Year: Patient declined  Transportation Needs: No Transportation Needs (04/11/2024)   PRAPARE - Administrator, Civil Service (Medical): No    Lack of Transportation (Non-Medical): No  Physical Activity: Unknown (04/11/2024)   Exercise Vital Sign    Days of Exercise per Week: Patient declined    Minutes of Exercise per Session: Not on file  Stress: Stress Concern Present (04/11/2024)   [employer] of Occupational Health - Occupational Stress Questionnaire    Feeling of Stress: Rather much  Social Connections: Moderately Isolated (04/11/2024)   Social  Connection and Isolation Panel    Frequency of Communication with Friends and Family: More than three times a week    Frequency of Social Gatherings with Friends and Family: Twice a week    Attends Religious Services: Patient declined    Database Administrator or Organizations: No    Attends Engineer, Structural: Not on file    Marital Status: Married  Catering Manager Violence: Not on file     Current Outpatient Medications:    albuterol  (VENTOLIN  HFA) 108 (90 Base) MCG/ACT inhaler, Inhale 2 puffs into the lungs every 4 (four) hours as needed., Disp: , Rfl:  atenolol  (TENORMIN ) 50 MG tablet, TAKE 1 TABLET BY MOUTH EVERY DAY, Disp: 90 tablet, Rfl: 0   azithromycin  (ZITHROMAX ) 250 MG tablet, 2 tabs poqday1, 1 tab poqday 2-5, Disp: 6 tablet, Rfl: 0   azithromycin  (ZITHROMAX ) 250 MG tablet, 2 tabs poqday1, 1 tab poqday 2-5, Disp: 6 tablet, Rfl: 0   baclofen  (LIORESAL ) 20 MG tablet, TAKE 2 TABLETS BY MOUTH 3 TIMES A DAY FOR SPASTICITY, Disp: 270 tablet, Rfl: 3   clonazePAM  (KLONOPIN ) 1 MG tablet, TAKE 1 TABLET BY MOUTH 2 TIMES DAILY AS NEEDED FOR ANXIETY., Disp: 30 tablet, Rfl: 3   doxylamine , Sleep, (UNISOM  SLEEPTABS) 25 MG tablet, Take 2 tablets (50 mg total) by mouth at bedtime as needed., Disp: 30 tablet, Rfl: 0   drospirenone -ethinyl estradiol  (NIKKI ) 3-0.02 MG tablet, TAKE 1 TABLET BY MOUTH EVERY DAY, Disp: 84 tablet, Rfl: 0   DULoxetine  (CYMBALTA ) 60 MG capsule, TAKE 1 CAPSULE BY MOUTH EVERY DAY, Disp: 90 capsule, Rfl: 0   etodolac  (LODINE ) 400 MG tablet, Take 1 tablet (400 mg total) by mouth 2 (two) times daily. (Patient not taking: Reported on 07/10/2024), Disp: 180 tablet, Rfl: 1   gabapentin  (NEURONTIN ) 600 MG tablet, Take 1 tablet (600 mg total) by mouth 4 (four) times daily., Disp: 360 tablet, Rfl: 1   HYDROcodone  bit-homatropine (HYCODAN) 5-1.5 MG/5ML syrup, Take 5 mLs by mouth every 8 (eight) hours as needed for cough., Disp: 120 mL, Rfl: 0   KESIMPTA  20 MG/0.4ML SOAJ,  INJECT 1 PEN UNDER THE SKIN EVERY MONTH, Disp: 0.4 mL, Rfl: 4   meclizine  (ANTIVERT ) 25 MG tablet, Take 1 tablet (25 mg total) by mouth 3 (three) times daily as needed for dizziness., Disp: 30 tablet, Rfl: 11   pantoprazole  (PROTONIX ) 40 MG tablet, Take 1 tablet (40 mg total) by mouth 2 (two) times daily., Disp: 180 tablet, Rfl: 0   predniSONE  (DELTASONE ) 20 MG tablet, 3 tabs poqday 1-2, 2 tabs poqday 3-4, 1 tab poqday 5-6, Disp: 12 tablet, Rfl: 0   Semaglutide -Weight Management (WEGOVY ) 0.5 MG/0.5ML SOAJ, Inject 0.5 mg into the skin once a week. (Patient not taking: Reported on 07/10/2024), Disp: 2 mL, Rfl: 1   SUMAtriptan  (IMITREX ) 50 MG tablet, TAKE 1 TABLET EVERY 2 HRS AS NEEDED FOR MIGRAINE. MAY REPEAT IN 2 HRS IF HEADACHE PERSISTS OR RECURS, Disp: 10 tablet, Rfl: 1   tiZANidine  (ZANAFLEX ) 4 MG tablet, One po bid, Disp: 180 tablet, Rfl: 3     ROS:  Review of Systems  Constitutional:  Negative for fatigue, fever and unexpected weight change.  Respiratory:  Negative for cough, shortness of breath and wheezing.   Cardiovascular:  Negative for chest pain, palpitations and leg swelling.  Gastrointestinal:  Negative for blood in stool, constipation, diarrhea, nausea and vomiting.  Endocrine: Negative for cold intolerance, heat intolerance and polyuria.  Genitourinary:  Positive for menstrual problem. Negative for dyspareunia, dysuria, flank pain, frequency, genital sores, hematuria, pelvic pain, urgency, vaginal bleeding, vaginal discharge and vaginal pain.  Musculoskeletal:  Negative for back pain, joint swelling and myalgias.  Skin:  Negative for rash.  Neurological:  Negative for dizziness, syncope, light-headedness, numbness and headaches.  Hematological:  Negative for adenopathy.  Psychiatric/Behavioral:  Negative for agitation, confusion, sleep disturbance and suicidal ideas. The patient is not nervous/anxious.    BREAST: No symptoms   Objective: There were no vitals taken for this  visit.   Physical Exam Constitutional:      Appearance: She is well-developed.  Genitourinary:  Vulva normal.     Right Labia: No rash, tenderness or lesions.    Left Labia: No tenderness, lesions or rash.    No vaginal discharge, erythema or tenderness.      Right Adnexa: not tender and no mass present.    Left Adnexa: not tender and no mass present.    No cervical friability or polyp.     Uterus is not enlarged or tender.  Breasts:    Right: No mass, nipple discharge, skin change or tenderness.     Left: No mass, nipple discharge, skin change or tenderness.  Neck:     Thyroid : No thyromegaly.  Cardiovascular:     Rate and Rhythm: Normal rate and regular rhythm.     Heart sounds: Normal heart sounds. No murmur heard. Pulmonary:     Effort: Pulmonary effort is normal.     Breath sounds: Normal breath sounds.  Abdominal:     Palpations: Abdomen is soft.     Tenderness: There is no abdominal tenderness. There is no guarding or rebound.  Musculoskeletal:        General: Normal range of motion.     Cervical back: Normal range of motion.  Lymphadenopathy:     Cervical: No cervical adenopathy.  Neurological:     General: No focal deficit present.     Mental Status: She is alert and oriented to person, place, and time.     Cranial Nerves: No cranial nerve deficit.  Skin:    General: Skin is warm and dry.  Psychiatric:        Mood and Affect: Mood normal.        Behavior: Behavior normal.        Thought Content: Thought content normal.        Judgment: Judgment normal.  Vitals reviewed.     Assessment/Plan: Encounter for annual routine gynecological examination  Cervical cancer screening - Plan: Cytology - PAP  Screening for HPV (human papillomavirus) - Plan: Cytology - PAP  Dysplasia of cervix, high grade CIN 2 - Plan: Cytology - PAP; repeat pap today.   Encounter for surveillance of contraceptive pills--OCP change due to persistent BTB, hx of leio. Rx yaz, f/u  in 3 months re: sx/sooner prn.   Encounter for screening mammogram for malignant neoplasm of breast; pt to schedule mammo  Family history of breast cancer--MyRisk testing discussed and pt to f/u if desires.   Leiomyoma--3 small fibroids on GYN u/s.    No orders of the defined types were placed in this encounter.             GYN counsel breast self exam, mammography screening, adequate intake of calcium and vitamin D , diet and exercise     F/U  No follow-ups on file.  Lynk Marti B. Zelena Bushong, PA-C 09/01/2024 3:30 PM

## 2024-09-02 ENCOUNTER — Encounter: Payer: Self-pay | Admitting: Obstetrics and Gynecology

## 2024-09-02 ENCOUNTER — Other Ambulatory Visit (HOSPITAL_COMMUNITY)
Admission: RE | Admit: 2024-09-02 | Discharge: 2024-09-02 | Disposition: A | Discharge status: Home / Self Care | Source: Ambulatory Visit | Attending: Obstetrics and Gynecology | Admitting: Obstetrics and Gynecology

## 2024-09-02 ENCOUNTER — Ambulatory Visit (INDEPENDENT_AMBULATORY_CARE_PROVIDER_SITE_OTHER): Admitting: Obstetrics and Gynecology

## 2024-09-02 VITALS — BP 119/75 | HR 74 | Ht 64.0 in | Wt 238.0 lb

## 2024-09-02 DIAGNOSIS — Z1151 Encounter for screening for human papillomavirus (HPV): Secondary | ICD-10-CM | POA: Insufficient documentation

## 2024-09-02 DIAGNOSIS — Z01411 Encounter for gynecological examination (general) (routine) with abnormal findings: Secondary | ICD-10-CM | POA: Diagnosis not present

## 2024-09-02 DIAGNOSIS — Z803 Family history of malignant neoplasm of breast: Secondary | ICD-10-CM

## 2024-09-02 DIAGNOSIS — Z1231 Encounter for screening mammogram for malignant neoplasm of breast: Secondary | ICD-10-CM

## 2024-09-02 DIAGNOSIS — Z01419 Encounter for gynecological examination (general) (routine) without abnormal findings: Secondary | ICD-10-CM

## 2024-09-02 DIAGNOSIS — D219 Benign neoplasm of connective and other soft tissue, unspecified: Secondary | ICD-10-CM

## 2024-09-02 DIAGNOSIS — Z124 Encounter for screening for malignant neoplasm of cervix: Secondary | ICD-10-CM

## 2024-09-02 DIAGNOSIS — D259 Leiomyoma of uterus, unspecified: Secondary | ICD-10-CM

## 2024-09-02 DIAGNOSIS — Z3041 Encounter for surveillance of contraceptive pills: Secondary | ICD-10-CM

## 2024-09-02 DIAGNOSIS — N871 Moderate cervical dysplasia: Secondary | ICD-10-CM | POA: Insufficient documentation

## 2024-09-02 MED ORDER — NORETHINDRONE 0.35 MG PO TABS: 1.0000 | ORAL_TABLET | Freq: Every day | ORAL | 3 refills | Status: AC

## 2024-09-02 NOTE — Patient Instructions (Signed)
 I value your feedback and you entrusting [place] with your care. If you get a [place] patient survey, I would appreciate you taking the time to let us know about your experience today. Thank you! ? ? ?

## 2024-09-04 LAB — CYTOLOGY - PAP
Adequacy: ABSENT
Comment: NEGATIVE
Diagnosis: NEGATIVE
High risk HPV: NEGATIVE

## 2024-09-08 ENCOUNTER — Encounter: Payer: Self-pay | Admitting: Neurology

## 2024-09-08 ENCOUNTER — Other Ambulatory Visit: Payer: Self-pay | Admitting: Neurology

## 2024-09-08 DIAGNOSIS — R208 Other disturbances of skin sensation: Secondary | ICD-10-CM

## 2024-09-08 DIAGNOSIS — G35A Relapsing-remitting multiple sclerosis: Secondary | ICD-10-CM

## 2024-09-08 DIAGNOSIS — R29818 Other symptoms and signs involving the nervous system: Secondary | ICD-10-CM

## 2024-09-08 MED ORDER — LACOSAMIDE 100 MG PO TABS: ORAL_TABLET | ORAL | 5 refills | Status: DC

## 2024-09-21 ENCOUNTER — Other Ambulatory Visit: Payer: Self-pay | Admitting: Family Medicine

## 2024-09-21 DIAGNOSIS — I10 Essential (primary) hypertension: Secondary | ICD-10-CM

## 2024-09-22 NOTE — Telephone Encounter (Signed)
 Requested Prescriptions  Pending Prescriptions Disp Refills   atenolol  (TENORMIN ) 50 MG tablet [Pharmacy Med Name: ATENOLOL  50 MG TABLET] 90 tablet 0    Sig: TAKE 1 TABLET BY MOUTH EVERY DAY     Cardiovascular: Beta Blockers 2 Failed - 09/22/2024  4:24 PM      Failed - Cr in normal range and within 360 days    Creat  Date Value Ref Range Status  10/03/2021 0.91 0.50 - 0.99 mg/dL Final   Creatinine, Ser  Date Value Ref Range Status  07/25/2022 0.89 0.57 - 1.00 mg/dL Final   Creatinine, Urine  Date Value Ref Range Status  07/26/2021 43 20 - 275 mg/dL Final         Passed - Last BP in normal range    BP Readings from Last 1 Encounters:  09/02/24 119/75         Passed - Last Heart Rate in normal range    Pulse Readings from Last 1 Encounters:  09/02/24 74         Passed - Valid encounter within last 6 months    Recent Outpatient Visits           2 months ago Bronchitis   [facility] Duanne Butler DASEN, MD   5 months ago Acute pain of right shoulder   [facility] Duanne Butler DASEN, MD   11 months ago Polycythemia   [facility] Duanne, Butler DASEN, MD   1 year ago Insomnia, unspecified type   [facility] Duanne Butler DASEN, MD   1 year ago Strain of left hip, initial encounter   [facility] [place], Butler DASEN, MD

## 2024-09-23 ENCOUNTER — Other Ambulatory Visit: Payer: Self-pay

## 2024-09-23 ENCOUNTER — Encounter: Payer: Self-pay | Admitting: Neurology

## 2024-10-01 ENCOUNTER — Other Ambulatory Visit: Payer: Self-pay | Admitting: Neurology

## 2024-10-08 ENCOUNTER — Ambulatory Visit (INDEPENDENT_AMBULATORY_CARE_PROVIDER_SITE_OTHER)

## 2024-10-08 DIAGNOSIS — R208 Other disturbances of skin sensation: Secondary | ICD-10-CM

## 2024-10-08 DIAGNOSIS — G35A Relapsing-remitting multiple sclerosis: Secondary | ICD-10-CM

## 2024-10-08 DIAGNOSIS — R29818 Other symptoms and signs involving the nervous system: Secondary | ICD-10-CM

## 2024-10-08 MED ORDER — GADOBENATE DIMEGLUMINE 529 MG/ML IV SOLN
20.0000 mL | Freq: Once | INTRAVENOUS | Status: AC | PRN
  Administered 2024-10-08: 20 mL via INTRAVENOUS

## 2024-10-09 ENCOUNTER — Ambulatory Visit: Payer: Self-pay | Admitting: Neurology

## 2024-10-13 ENCOUNTER — Other Ambulatory Visit: Payer: Self-pay | Admitting: Neurology

## 2024-10-13 ENCOUNTER — Encounter: Payer: Self-pay | Admitting: Neurology

## 2024-10-13 ENCOUNTER — Ambulatory Visit

## 2024-10-13 MED ORDER — OXCARBAZEPINE 300 MG PO TABS: ORAL_TABLET | ORAL | 11 refills | Status: DC

## 2024-10-27 ENCOUNTER — Ambulatory Visit: Admitting: Family Medicine

## 2024-10-27 ENCOUNTER — Encounter: Payer: Self-pay | Admitting: Family Medicine

## 2024-10-27 VITALS — BP 131/87 | HR 83 | Ht 64.0 in | Wt 253.8 lb

## 2024-10-27 DIAGNOSIS — M4802 Spinal stenosis, cervical region: Secondary | ICD-10-CM

## 2024-10-27 DIAGNOSIS — R635 Abnormal weight gain: Secondary | ICD-10-CM | POA: Diagnosis not present

## 2024-10-27 DIAGNOSIS — E559 Vitamin D deficiency, unspecified: Secondary | ICD-10-CM | POA: Diagnosis not present

## 2024-10-27 DIAGNOSIS — G4700 Insomnia, unspecified: Secondary | ICD-10-CM | POA: Diagnosis not present

## 2024-10-27 DIAGNOSIS — R5383 Other fatigue: Secondary | ICD-10-CM

## 2024-10-27 DIAGNOSIS — R208 Other disturbances of skin sensation: Secondary | ICD-10-CM | POA: Diagnosis not present

## 2024-10-27 DIAGNOSIS — G43709 Chronic migraine without aura, not intractable, without status migrainosus: Secondary | ICD-10-CM

## 2024-10-27 DIAGNOSIS — Z79899 Other long term (current) drug therapy: Secondary | ICD-10-CM

## 2024-10-27 DIAGNOSIS — R519 Headache, unspecified: Secondary | ICD-10-CM

## 2024-10-27 DIAGNOSIS — R0683 Snoring: Secondary | ICD-10-CM

## 2024-10-27 DIAGNOSIS — G35A Relapsing-remitting multiple sclerosis: Secondary | ICD-10-CM

## 2024-10-27 MED ORDER — METHYLPREDNISOLONE 4 MG PO TBPK: ORAL_TABLET | ORAL | 0 refills | Status: AC

## 2024-10-27 MED ORDER — GABAPENTIN 600 MG PO TABS: ORAL_TABLET | ORAL | 5 refills | Status: AC

## 2024-10-27 NOTE — Patient Instructions (Signed)
 Below is our plan:  We will continue current treatment plan but will increase gabapentin  to 600mg  with meals and 1200mg  at bedtime. I will place a referral to neurosurgery for your abnormal cervical imaging. I have called in a steroid taper for you to take for inflammation. We will order a sleep study for evaluation of possible sleep apnea.   Please make sure you are staying well hydrated. I recommend 50-60 ounces daily. Well balanced diet and regular exercise encouraged. Consistent sleep schedule with 6-8 hours recommended.   Please continue follow up with care team as directed.   Follow up with Dr Vear in 6 months   You may receive a survey regarding today's visit. I encourage you to leave honest feed back as I do use this information to improve patient care. Thank you for seeing me today!

## 2024-10-27 NOTE — Progress Notes (Signed)
 "   Chief Complaint  Patient presents with   Follow-up    Patient in room 6 alone.  Patient here for MS follow up. Patient states she's hurting all over recently, and having an electric shock that goes down her entire body- patient states upwards of 15 times in a day. Patient states it can be brought on by standing up too fast, or turning her head. Patient states when she lays down flat or with slight elevation she feels like she can't breathe.     HISTORY OF PRESENT ILLNESS:  10/27/2024 ALL:  Virginia [name] is a 43 y.o. female here today for follow up for RRMS. She continues Kesimpta . She continues Kesimpta . MRI brain 09/2024 stable.   She feels she is fairly stable. No obvious exacerbating symptoms. She feels gait is poor. She reports a couple of falls falls over the past 6 months. She feels that her feet just don't move as they should. No significant weakness of ext. She uses cane when on uneven ground. Hold on to bannister with steps.   Generalized pain is stable. She does report more electrical pains. Pain is all over, seems worse with position changes. She continues baclofen  10mg  3-5 times daily and gabapentin  600mg  QID. She is having more neck pain. She was referred to NS by PCP following cervical MRI 04/2021 showing severe foraminal stenosis at C6-C7. She was never seen. Updated imaging 09/2024 showed moderately severe right foraminal narrowing and moderate left foraminal narrowing. There is potential for right C7 nerve root compression.   Headaches are stable. She is having 4-5 migraines per month. Sumatriptan  does help abort migraine. Meclizine  helps with occasional vertigo.   Sleep is poor. She is always tired. She snores occasionally. Wakes with morning headaches. HST boarderline in 2019. She reports 80lb weight gain since. Sleep time varies. She usually goes to bed around 3am and wakes around 9-10am. She reports Dr Duanne stopped amitriptyline  due to fatigue. Mood is stable on  clonazepam  and duloxetine . These medications managed by PCP. Wellbutrin  not effective.    HISTORY (copied from Dr Duncan previous note)  Virginia [name] is a 43 y.o. woman with relapsing remitting multiple sclerosis who also has back pain.   Update  03/26/2024: She went back on Kesimpta  after converting to JCV Ab positive.   She tolerates it ok but feels a wearing off after a couple weeks.    She denies any exacerbation.   Last Tysabri  ws 01/03/2023   She is tolerating it well.   MRI of the brain 6/112023 showed  T2/FLAIR hyperintense foci in the cerebral hemispheres and the left medulla in a pattern consistent with chronic demyelinating plaque associated with multiple sclerosis.  None of the foci appear to be acute.  Compared to the MRI dated 01/20/2020, there are no new lesions.   Gait is about the same with mildly reduced balance.   She stumbles but no recent falls.  She uses the bannister going downstairs   She can walk > 1/2 without a break but does not keep up well with others.  .  She notes spasticity and cramping helped by Baclofen  20 mg po tid and tizanidine  at bedtime.       She has mild burning in her legs, right > left helped some by gabapentin  600 mg po qid.    No vertigo.     She denies hearing loss.       Bladder function is fine.      Vision is fine.  She notes fatigue is her main problem and some days she just wants to stay in bed.    Sleep is better with noght time tizanidine    Mood is fine.       Migraines occur about 8-10/month.    She gets a few a month still and takes sumatriptan .   Aimovig  had helped but insurance stopped coverage.  She gained back some of the weight she lost.      She has low back pain, worse when she stands and some pain down le.  She has a disc herniation at L5-S1.  She also has neck pain but not arm pain.    MS History:   In mid May 2017, she was starting to experience occasional lightheadedness and a spinning vertigo. On 03/19/2016, she had more extreme  vertigo and had an episode of syncope. She notices that when she was walking she would veer towards the right. Because of the syncope, she was taken to the emergency room. He had an MRI of the brain that showed several enhancing lesions, potentially worrisome for multiple sclerosis. Additional studies were performed. She received 3 days of IV steroids. The MRI's of the spine did not show any additional plaques. The lumbar puncture showed oligoclonal bands with normal IgG index, consistent with multiple sclerosis.   She started  Aubagio .   MRI in 2018 showed additional foci plus she had tolerability issues.   She has been on Tysabri  since 12/26/2016.  She switched to Kesimpta  07/2021   She went back to Tysabri  07/2022.       LABS/LP She underwent a lumbar puncture on 03/21/2016. 4 oligoclonal bands present in the CSF which were not present in the serum, consistent with multiple sclerosis. The IgG index was high normal at 0.6.   There were only 4 white blood cells but 280 red blood cells more consistent with a slightly traumatic tap.    [facility] Spotted Fever CSF IgG was negative but the IgM was positive at 1.41 (less than 0.9 normal).    ANA and ANCA were both negative.   IMAGING MRI images from 03/20/2016, 03/22/2016 and CT angiogram images from 03/23/2016 reviewed: The MRI of the brain shows several 2/fair hyperintense foci, some in the periventricular and juxtacortical white matter. Most of the foci enhanced after contrast administration. MRI of the cervical and thoracic spine did not show any spinal cord plaques and there was no significant degenerative change.   CT angiogram was essentially normal showing no significant stenosis.   MRI 11/28/2016 showed multiple periventricular, subcortical, pericallosal as well as craniovertebral junction white matter hyperintensities compatible with multiple sclerosis. Multiple enhancing lesions are noted in the periventricular regions with the largest one in  the left posterior frontal subcortical white matter. Incidental large venous angioma is noted in the right parietal region. Compared with MRI scan dated 03/20/2016 there are several new enhancing as well as nonenhancing white matter lesions.   MRI 12/25/2016 of the cervical spine showed two T2 hyperintense foci within the left posterolateral spinal cord, one adjacent to C6-C7 and one adjacent to T1. There may have been subtle signal within the spinal cord adjacent to C6-C7 on the previous scan but the current focus is clearly larger. The focus adjacent to T1 was not evident on the previous MRI. Neither of these 2 foci enhanced with contrast.         There is mild spinal stenosis at C4-C5, C5-C6 and C6-C7, unchanged when compared to the previous  MRI. There is mild right foraminal narrowing at C6-C7 but there is no nerve root compression.   MRI 01/10/2018 of the brain showed multiple T2/FLAIR hyperintense foci in the brainstem and in the periventricular, juxtacortical and deep white matter in a pattern and configuration consistent with chronic demyelinating plaque associated with multiple sclerosis.  Compared to the MRI dated 11/28/2016, there is an improved appearance in the lesions that were enhancing at that time no longer do so.  There are no new lesions.   MRI [facility] 01/27/2018 showed, at L4-L5, there is stable mild left facet hypertrophy and mild ligamentum flavum hypertrophy.  There is no nerve root compression.     At L5-S1, there is a stable small central disc herniation causing bilateral lateral recess narrowing.  This contacts both of the S1 nerve roots.  There is no definite change when compared to the previous MRI.   MRI brain 01/17/2019 showed multiple T2/flair hyperintense foci in the hemispheres in a pattern and configuration consistent with chronic demyelinating plaque associated with multiple sclerosis.  Two foci that have been seen previously in the brainstem are not apparent on the current MRI.   There are no new lesions compared to the 2019 MRI.  None of the foci enhance.    There is a small developmental venous anomaly in the right parietal lobe that is unchanged in appearance.  Otherwise, the enhancement pattern is normal.   MRI of the brain 01/20/2020 showed scattered T2/FLAIR hyperintense foci in the hemispheres and medulla in a pattern and configuration consistent with chronic demyelinating plaque associated with multiple sclerosis.   Compared to the MRI from 01/17/2019, there are no new lesions.   MRI of the brain 6/112023 showed  T2/FLAIR hyperintense foci in the cerebral hemispheres and the left medulla in a pattern consistent with chronic demyelinating plaque associated with multiple sclerosis.  None of the foci appear to be acute.  Compared to the MRI dated 01/20/2020, there are no new lesions.  REVIEW OF SYSTEMS: Out of a complete 14 system review of symptoms, the patient complains only of the following symptoms, headaches, chronic pain, depression, muscle cramps, fatigue, falls, insomnia, dizziness and all other reviewed systems are negative.   ALLERGIES: Allergies  Allergen Reactions   Codeine Other (See Comments)    Hallucinations    Onion Swelling   Topamax  [Topiramate ]     Was talking out of her head, walked into walls     HOME MEDICATIONS: Outpatient Medications Prior to Visit  Medication Sig Dispense Refill   albuterol  (VENTOLIN  HFA) 108 (90 Base) MCG/ACT inhaler Inhale 2 puffs into the lungs every 4 (four) hours as needed.     atenolol  (TENORMIN ) 50 MG tablet TAKE 1 TABLET BY MOUTH EVERY DAY 90 tablet 0   baclofen  (LIORESAL ) 20 MG tablet TAKE 2 TABLETS BY MOUTH 3 TIMES A DAY FOR SPASTICITY 270 tablet 3   clonazePAM  (KLONOPIN ) 1 MG tablet TAKE 1 TABLET BY MOUTH 2 TIMES DAILY AS NEEDED FOR ANXIETY. 30 tablet 3   doxylamine , Sleep, (UNISOM  SLEEPTABS) 25 MG tablet Take 2 tablets (50 mg total) by mouth at bedtime as needed. 30 tablet 0   DULoxetine  (CYMBALTA ) 60 MG  capsule TAKE 1 CAPSULE BY MOUTH EVERY DAY 90 capsule 0   KESIMPTA  20 MG/0.4ML SOAJ INJECT 1 PEN UNDER THE SKIN EVERY MONTH 0.4 mL 4   meclizine  (ANTIVERT ) 25 MG tablet Take 1 tablet (25 mg total) by mouth 3 (three) times daily as needed for dizziness. 30 tablet  11   norethindrone  (MICRONOR ) 0.35 MG tablet Take 1 tablet (0.35 mg total) by mouth daily. 84 tablet 3   Oxcarbazepine  (TRILEPTAL ) 300 MG tablet 1 p.o. every morning and 2 p.o. nightly 90 tablet 11   pantoprazole  (PROTONIX ) 40 MG tablet Take 1 tablet (40 mg total) by mouth 2 (two) times daily. 180 tablet 0   SUMAtriptan  (IMITREX ) 50 MG tablet TAKE 1 TABLET EVERY 2 HRS AS NEEDED FOR MIGRAINE. MAY REPEAT IN 2 HRS IF HEADACHE PERSISTS OR RECURS 10 tablet 1   tiZANidine  (ZANAFLEX ) 4 MG tablet One po bid 180 tablet 3   gabapentin  (NEURONTIN ) 600 MG tablet TAKE 1 TABLET BY MOUTH 4 TIMES DAILY. 360 tablet 1   azithromycin  (ZITHROMAX ) 250 MG tablet 2 tabs poqday1, 1 tab poqday 2-5 (Patient not taking: Reported on 10/27/2024) 6 tablet 0   azithromycin  (ZITHROMAX ) 250 MG tablet 2 tabs poqday1, 1 tab poqday 2-5 (Patient not taking: Reported on 10/27/2024) 6 tablet 0   HYDROcodone  bit-homatropine (HYCODAN) 5-1.5 MG/5ML syrup Take 5 mLs by mouth every 8 (eight) hours as needed for cough. (Patient not taking: Reported on 10/27/2024) 120 mL 0   predniSONE  (DELTASONE ) 20 MG tablet 3 tabs poqday 1-2, 2 tabs poqday 3-4, 1 tab poqday 5-6 (Patient not taking: Reported on 10/27/2024) 12 tablet 0   No facility-administered medications prior to visit.     PAST MEDICAL HISTORY: Past Medical History:  Diagnosis Date   Arthritis    Chronic migraine w/o aura w/o status migrainosus, not intractable    followed by dr vear   GAD (generalized anxiety disorder)    GERD (gastroesophageal reflux disease)    Hemorrhoids    History of cervical dysplasia    followed by GYN--- dr jonelle. harris;    hx CIN 2  s/p  colposcopy in office 05/ 2022 and CIN 3  s/p LEEP 11/ 2022    History of syncope 02/2016   admission in epic;  in setting of vertigo and dx w/ MS   Insomnia    Multiple sclerosis, relapsing-remitting 02/2016   neurologist--- dr vear;  dx 05/ 2017 lumbar puncture,  dyesthesia, fatigue, vertigo   PAC (premature atrial contraction)    PSVT (paroxysmal supraventricular tachycardia) 2019   (02-01-2023 pt stated followed by pcp) // cardiologist--- dr g. taylor;  (lov in epic 06-21-2018)  evaluated 2019 after dx MS in 2017 with syncope/ palpitations;   event monitor showed PSVT,  PACs/ PVCs   PVC (premature ventricular contraction)    Sleep apnea, unspecified    study in epic 08-07-2018  borderline sleep apnea,  recommendation loss wt, sleep hygiene, mouth guard     PAST SURGICAL HISTORY: Past Surgical History:  Procedure Laterality Date   COLONOSCOPY WITH ESOPHAGOGASTRODUODENOSCOPY (EGD)  11/15/2022   dr legrand;   EVALUATION UNDER ANESTHESIA WITH HEMORRHOIDECTOMY N/A 02/08/2023   Procedure: ANORECTAL EXAM UNDER ANESTHESIA;  Surgeon: Teresa Lonni HERO, MD;  Location: [facility];  Service: General;  Laterality: N/A;   HEMORRHOID SURGERY  02/08/2023   Procedure: EXTERNAL HEMORRHOIDECTOMY X2;  Surgeon: Teresa Lonni HERO, MD;  Location: [facility];  Service: General;;   LEEP N/A 09/20/2021   Procedure: LOOP ELECTROSURGICAL EXCISION PROCEDURE (LEEP);  Surgeon: Arloa Lamar SQUIBB, MD;  Location: [facility];  Service: Gynecology;  Laterality: N/A;     FAMILY HISTORY: Family History  Problem Relation Age of Onset   Emphysema Mother    Osteoarthritis Mother    Anxiety disorder Mother  Uterine cancer Mother 42   Other Father    Suicidality Father    Breast cancer Maternal Aunt    Breast cancer Maternal Aunt    Breast cancer Maternal Aunt    ALS Maternal Grandmother    Colon cancer Neg Hx    Colon polyps Neg Hx    Esophageal cancer Neg Hx    Rectal cancer Neg Hx    Stomach cancer Neg Hx      SOCIAL  HISTORY: Social History   Socioeconomic History   Marital status: Married    Spouse name: Not on file   Number of children: Not on file   Years of education: Not on file   Highest education level: 12th grade  Occupational History   Not on file  Tobacco Use   Smoking status: Former    Current packs/day: 0.00    Average packs/day: 0.5 packs/day for 10.0 years (5.0 ttl pk-yrs)    Types: Cigarettes    Start date: 2005    Quit date: 2015    Years since quitting: 11.0   Smokeless tobacco: Never  Vaping Use   Vaping status: Every Day   Substances: Nicotine, Flavoring, Nicotine-salt   Devices: unsure name  Substance and Sexual Activity   Alcohol use: Yes    Comment: rarely   Drug use: Never   Sexual activity: Yes    Birth control/protection: Pill  Other Topics Concern   Not on file  Social History Narrative   Patient does not live alone,    Patient does not work at this time.    Social Drivers of Health   Tobacco Use: Medium Risk (10/27/2024)   Patient History    Smoking Tobacco Use: Former    Smokeless Tobacco Use: Never    Passive Exposure: Not on file  Financial Resource Strain: Medium Risk (04/11/2024)   Overall Financial Resource Strain (CARDIA)    Difficulty of Paying Living Expenses: Somewhat hard  Food Insecurity: Patient Declined (04/11/2024)   Epic    Worried About Programme Researcher, Broadcasting/film/video in the Last Year: Patient declined    Barista in the Last Year: Patient declined  Transportation Needs: No Transportation Needs (04/11/2024)   Epic    Lack of Transportation (Medical): No    Lack of Transportation (Non-Medical): No  Physical Activity: Unknown (04/11/2024)   Exercise Vital Sign    Days of Exercise per Week: Patient declined    Minutes of Exercise per Session: Not on file  Stress: Stress Concern Present (04/11/2024)   [employer] of Occupational Health - Occupational Stress Questionnaire    Feeling of Stress: Rather much  Social Connections:  Moderately Isolated (04/11/2024)   Social Connection and Isolation Panel    Frequency of Communication with Friends and Family: More than three times a week    Frequency of Social Gatherings with Friends and Family: Twice a week    Attends Religious Services: Patient declined    Database Administrator or Organizations: No    Attends Banker Meetings: Not on file    Marital Status: Married  Intimate Partner Violence: Not on file  Depression (PHQ2-9): Medium Risk (10/03/2023)   Depression (PHQ2-9)    PHQ-2 Score: 5  Alcohol Screen: Low Risk (04/11/2024)   Alcohol Screen    Last Alcohol Screening Score (AUDIT): 1  Housing: Unknown (04/11/2024)   Epic    Unable to Pay for Housing in the Last Year: No    Number of Times  Moved in the Last Year: Not on file    Homeless in the Last Year: No  Utilities: Not on file  Health Literacy: Not on file     PHYSICAL EXAM  Vitals:   10/27/24 1425  BP: 131/87  Pulse: 83  Weight: 253 lb 12.8 oz (115.1 kg)  Height: 5' 4 (1.626 m)     Body mass index is 43.56 kg/m.   Generalized: Well developed, in no acute distress  Cardiology: normal rate and rhythm, no murmur auscultated  Respiratory: clear to auscultation bilaterally    Neurological examination  Mentation: Alert oriented to time, place, history taking. Follows all commands speech and language fluent Cranial nerve II-XII: Pupils were equal round reactive to light. Extraocular movements were full, visual field were full on confrontational test. Facial sensation and strength were normal. Uvula tongue midline. Head turning and shoulder shrug  were normal and symmetric. Motor: The motor testing reveals 5 over 5 strength of all 4 extremities. Good symmetric motor tone is noted throughout.  Sensory: Sensory testing is intact to soft touch on all 4 extremities. No evidence of extinction is noted. Negative Lhermitte sign.   Coordination: Cerebellar testing reveals good  finger-nose-finger and heel-to-shin bilaterally.  Gait and station: Gait is normal.  Reflexes: Deep tendon reflexes are symmetric and normal bilaterally.    DIAGNOSTIC DATA (LABS, IMAGING, TESTING) - I reviewed patient records, labs, notes, testing and imaging myself where available.  Lab Results  Component Value Date   WBC 7.9 03/26/2024   HGB 14.1 03/26/2024   HCT 42.7 03/26/2024   MCV 98 (H) 03/26/2024   PLT 328 03/26/2024      Component Value Date/Time   NA 140 07/25/2022 1558   K 3.7 07/25/2022 1558   CL 104 07/25/2022 1558   CO2 21 07/25/2022 1558   GLUCOSE 104 (H) 07/25/2022 1558   GLUCOSE 120 (H) 10/03/2021 1645   BUN 12 07/25/2022 1558   CREATININE 0.89 07/25/2022 1558   CREATININE 0.91 10/03/2021 1645   CALCIUM 8.9 07/25/2022 1558   PROT 5.7 (L) 07/25/2022 1558   ALBUMIN 3.7 (L) 07/25/2022 1558   AST 15 07/25/2022 1558   ALT 34 (H) 07/25/2022 1558   ALKPHOS 77 07/25/2022 1558   BILITOT <0.2 07/25/2022 1558   GFRNONAA >60 09/27/2021 1656   GFRNONAA 80 08/24/2020 1643   GFRAA 93 08/24/2020 1643   Lab Results  Component Value Date   CHOL 109 07/25/2022   HDL 35 (L) 07/25/2022   LDLCALC 58 07/25/2022   TRIG 76 07/25/2022   CHOLHDL 3.1 07/25/2022   Lab Results  Component Value Date   HGBA1C 5.3 07/25/2022   No results found for: [id number] Lab Results  Component Value Date   TSH 1.280 07/25/2022        No data to display               No data to display           ASSESSMENT AND PLAN  43 y.o. year old female  has a past medical history of Arthritis, Chronic migraine w/o aura w/o status migrainosus, not intractable, GAD (generalized anxiety disorder), GERD (gastroesophageal reflux disease), Hemorrhoids, History of cervical dysplasia, History of syncope (02/2016), Insomnia, Multiple sclerosis, relapsing-remitting (02/2016), PAC (premature atrial contraction), PSVT (paroxysmal supraventricular tachycardia) (2019), PVC (premature ventricular  contraction), and Sleep apnea, unspecified. here with    Multiple sclerosis, relapsing-remitting - Plan: CBC with Differential/Platelets, IgG, IgA, IgM  Dysesthesia - Plan: B12 and Folate  Panel  High risk medication use  Chronic migraine w/o aura w/o status migrainosus, not intractable  Other fatigue - Plan: Home sleep test, B12 and Folate Panel  Insomnia, unspecified type  Vitamin D  deficiency - Plan: Vitamin D , 25-hydroxy  Weight gain  Morning headache  Snoring - Plan: Home sleep test  Virginia [name] reports more dysesthesias. We will continue Kesimpta , baclofen , oxcarbazepine  and sumatriptan  as prescribed. I will increase gabapentin  to 600mg  TID with 1200mg  bedtime dose. Referral placed to neurosurgery for stenosis noted on imaging. May consider pain management if needed. Steroid taper called in. I will update labs, today. We will order HST for eval of sleep apnea. She may take meclizine  as needed for vertigo. 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 Consider exercise regimen. She will follow up with Dr Vear in 6 months.    Orders Placed This Encounter  Procedures   CBC with Differential/Platelets   IgG, IgA, IgM   Vitamin D , 25-hydroxy   B12 and Folate Panel   Home sleep test    Standing Status:   Future    Expiration Date:   10/27/2025    Where should this test be performed::   [facility]      Meds ordered this encounter  Medications   methylPREDNISolone  (MEDROL  DOSEPAK) 4 MG TBPK tablet    Sig: Taper as directed    Dispense:  1 each    Refill:  0    Supervising Provider:   YAN, YIJUN [[id number]]   gabapentin  (NEURONTIN ) 600 MG tablet    Sig: Take 1 tablet (600 mg total) by mouth daily with breakfast AND 1 tablet (600 mg total) daily with lunch AND 1 tablet (600 mg total) daily after supper AND 2 tablets (1,200 mg total) at bedtime.    Dispense:  150 tablet    Refill:  5    Supervising Provider:   YAN, YIJUN [phone]    I personally  spent a total of 45 minutes in the care of the patient today including preparing to see the patient, getting/reviewing separately obtained history, performing a medically appropriate exam/evaluation, counseling and educating, placing orders, referring and communicating with other health care professionals, documenting clinical information in the EHR, independently interpreting results, and communicating results.    Greig Forbes, MSN, FNP-C 10/27/2024, 3:46 PM  [facility] [address], [address] [place], [place] [address] [phone]     "

## 2024-10-28 ENCOUNTER — Ambulatory Visit: Admitting: Family Medicine

## 2024-10-28 ENCOUNTER — Ambulatory Visit: Payer: Self-pay | Admitting: Family Medicine

## 2024-10-28 VITALS — BP 132/80 | HR 74 | Temp 98.2°F | Ht 64.0 in | Wt 250.0 lb

## 2024-10-28 DIAGNOSIS — R0602 Shortness of breath: Secondary | ICD-10-CM

## 2024-10-28 DIAGNOSIS — R635 Abnormal weight gain: Secondary | ICD-10-CM | POA: Diagnosis not present

## 2024-10-28 DIAGNOSIS — G4733 Obstructive sleep apnea (adult) (pediatric): Secondary | ICD-10-CM

## 2024-10-28 DIAGNOSIS — M4802 Spinal stenosis, cervical region: Secondary | ICD-10-CM

## 2024-10-28 LAB — CBC WITH DIFFERENTIAL/PLATELET
Basophils Absolute: 0.1 x10E3/uL (ref 0.0–0.2)
Basos: 1 %
EOS (ABSOLUTE): 0.7 x10E3/uL — ABNORMAL HIGH (ref 0.0–0.4)
Eos: 8 %
Hematocrit: 45 % (ref 34.0–46.6)
Hemoglobin: 15 g/dL (ref 11.1–15.9)
Immature Grans (Abs): 0 x10E3/uL (ref 0.0–0.1)
Immature Granulocytes: 0 %
Lymphocytes Absolute: 1.7 x10E3/uL (ref 0.7–3.1)
Lymphs: 18 %
MCH: 32.9 pg (ref 26.6–33.0)
MCHC: 33.3 g/dL (ref 31.5–35.7)
MCV: 99 fL — ABNORMAL HIGH (ref 79–97)
Monocytes Absolute: 0.7 x10E3/uL (ref 0.1–0.9)
Monocytes: 8 %
Neutrophils Absolute: 6.1 x10E3/uL (ref 1.4–7.0)
Neutrophils: 65 %
Platelets: 400 x10E3/uL (ref 150–450)
RBC: 4.56 x10E6/uL (ref 3.77–5.28)
RDW: 12.4 % (ref 11.7–15.4)
WBC: 9.3 x10E3/uL (ref 3.4–10.8)

## 2024-10-28 LAB — IGG, IGA, IGM
IgG (Immunoglobin G), Serum: 913 mg/dL (ref 586–1602)
IgM (Immunoglobulin M), Srm: 20 mg/dL — ABNORMAL LOW (ref 26–217)
Immunoglobulin A, (IgA) QN, Serum: 119 mg/dL (ref 87–352)

## 2024-10-28 LAB — INFLUENZA A AND B AG, IMMUNOASSAY
INFLUENZA A ANTIGEN: NOT DETECTED
INFLUENZA B ANTIGEN: NOT DETECTED

## 2024-10-28 LAB — VITAMIN D 25 HYDROXY (VIT D DEFICIENCY, FRACTURES): Vit D, 25-Hydroxy: 36 ng/mL (ref 30.0–100.0)

## 2024-10-28 LAB — B12 AND FOLATE PANEL
Folate: 6.9 ng/mL
Vitamin B-12: 900 pg/mL (ref 232–1245)

## 2024-10-28 MED ORDER — DEXAMETHASONE 1 MG PO TABS: 1.0000 mg | ORAL_TABLET | Freq: Every day | ORAL | 0 refills | Status: DC

## 2024-10-28 NOTE — Progress Notes (Signed)
 "  Subjective:    Patient ID: Virginia [name], female    DOB: 12-Sep-1981, 43 y.o.   MRN: [id number]  Patient reports increasing dyspnea.  She states that when she lays supine at night, she feels like she is suffocating and is smothering.  If she sits up, she still feels like she is suffocating and smothering.  It is most pronounced at night.  During the day however she reports dyspnea on exertion.  She has a 20-pack-year history of smoking.  She quit approximately 10 years ago and started vaping.  She has been vaping for the last 10 years.  She does state that she has occasional wheezing but denies any purulent sputum or hemoptysis or fever or chills.  She denies any angina.  She denies any chest pain or chest pressure.  She does report significant weight gain.  She is currently on dexamethasone  4 MS. Wt Readings from Last 3 Encounters:  10/28/24 250 lb (113.4 kg)  10/27/24 253 lb 12.8 oz (115.1 kg)  09/02/24 238 lb (108 kg)   Despite diet and exercise the patient states she has been unable to lose weight.  She denies any pleurisy or hemoptysis.  She does report a pump forming on the back of her neck.  She also has a moon like facies.  She does report hirsutism on her upper lip although this is not obvious today on exam.  She reports decreased and infrequent menstrual cycles however she attributes this to her birth control. Past Medical History:  Diagnosis Date   Arthritis    Chronic migraine w/o aura w/o status migrainosus, not intractable    followed by dr vear   GAD (generalized anxiety disorder)    GERD (gastroesophageal reflux disease)    Hemorrhoids    History of cervical dysplasia    followed by GYN--- dr jonelle. harris;    hx CIN 2  s/p  colposcopy in office 05/ 2022 and CIN 3  s/p LEEP 11/ 2022   History of syncope 02/2016   admission in epic;  in setting of vertigo and dx w/ MS   Insomnia    Multiple sclerosis, relapsing-remitting 02/2016   neurologist--- dr vear;  dx 05/ 2017 lumbar  puncture,  dyesthesia, fatigue, vertigo   PAC (premature atrial contraction)    PSVT (paroxysmal supraventricular tachycardia) 2019   (02-01-2023 pt stated followed by pcp) // cardiologist--- dr g. taylor;  (lov in epic 06-21-2018)  evaluated 2019 after dx MS in 2017 with syncope/ palpitations;   event monitor showed PSVT,  PACs/ PVCs   PVC (premature ventricular contraction)    Sleep apnea, unspecified    study in epic 08-07-2018  borderline sleep apnea,  recommendation loss wt, sleep hygiene, mouth guard   Past Surgical History:  Procedure Laterality Date   COLONOSCOPY WITH ESOPHAGOGASTRODUODENOSCOPY (EGD)  11/15/2022   dr legrand;   EVALUATION UNDER ANESTHESIA WITH HEMORRHOIDECTOMY N/A 02/08/2023   Procedure: ANORECTAL EXAM UNDER ANESTHESIA;  Surgeon: Teresa Lonni HERO, MD;  Location: [facility];  Service: General;  Laterality: N/A;   HEMORRHOID SURGERY  02/08/2023   Procedure: EXTERNAL HEMORRHOIDECTOMY X2;  Surgeon: Teresa Lonni HERO, MD;  Location[facility];  Service: General;;   LEEP N/A 09/20/2021   Procedure: LOOP ELECTROSURGICAL EXCISION PROCEDURE (LEEP);  Surgeon: Arloa Lamar SQUIBB, MD;  Location: [facility];  Service: Gynecology;  Laterality: N/A;   Current Outpatient Medications on File Prior to Visit  Medication Sig Dispense Refill   albuterol  (VENTOLIN  HFA)  108 (90 Base) MCG/ACT inhaler Inhale 2 puffs into the lungs every 4 (four) hours as needed.     atenolol  (TENORMIN ) 50 MG tablet TAKE 1 TABLET BY MOUTH EVERY DAY 90 tablet 0   baclofen  (LIORESAL ) 20 MG tablet TAKE 2 TABLETS BY MOUTH 3 TIMES A DAY FOR SPASTICITY 270 tablet 3   clonazePAM  (KLONOPIN ) 1 MG tablet TAKE 1 TABLET BY MOUTH 2 TIMES DAILY AS NEEDED FOR ANXIETY. 30 tablet 3   doxylamine , Sleep, (UNISOM  SLEEPTABS) 25 MG tablet Take 2 tablets (50 mg total) by mouth at bedtime as needed. 30 tablet 0   DULoxetine  (CYMBALTA ) 60 MG capsule TAKE 1 CAPSULE BY MOUTH EVERY DAY 90 capsule 0    gabapentin  (NEURONTIN ) 600 MG tablet Take 1 tablet (600 mg total) by mouth daily with breakfast AND 1 tablet (600 mg total) daily with lunch AND 1 tablet (600 mg total) daily after supper AND 2 tablets (1,200 mg total) at bedtime. 150 tablet 5   KESIMPTA  20 MG/0.4ML SOAJ INJECT 1 PEN UNDER THE SKIN EVERY MONTH 0.4 mL 4   meclizine  (ANTIVERT ) 25 MG tablet Take 1 tablet (25 mg total) by mouth 3 (three) times daily as needed for dizziness. 30 tablet 11   methylPREDNISolone  (MEDROL  DOSEPAK) 4 MG TBPK tablet Taper as directed 1 each 0   norethindrone  (MICRONOR ) 0.35 MG tablet Take 1 tablet (0.35 mg total) by mouth daily. 84 tablet 3   Oxcarbazepine  (TRILEPTAL ) 300 MG tablet 1 p.o. every morning and 2 p.o. nightly 90 tablet 11   pantoprazole  (PROTONIX ) 40 MG tablet Take 1 tablet (40 mg total) by mouth 2 (two) times daily. 180 tablet 0   SUMAtriptan  (IMITREX ) 50 MG tablet TAKE 1 TABLET EVERY 2 HRS AS NEEDED FOR MIGRAINE. MAY REPEAT IN 2 HRS IF HEADACHE PERSISTS OR RECURS 10 tablet 1   tiZANidine  (ZANAFLEX ) 4 MG tablet One po bid 180 tablet 3   No current facility-administered medications on file prior to visit.   Allergies  Allergen Reactions   Codeine Other (See Comments)    Hallucinations    Onion Swelling   Topamax  [Topiramate ]     Was talking out of her head, walked into walls   Social History   Socioeconomic History   Marital status: Married    Spouse name: Not on file   Number of children: Not on file   Years of education: Not on file   Highest education level: 12th grade  Occupational History   Not on file  Tobacco Use   Smoking status: Former    Current packs/day: 0.00    Average packs/day: 0.5 packs/day for 10.0 years (5.0 ttl pk-yrs)    Types: Cigarettes    Start date: 2005    Quit date: 2015    Years since quitting: 11.0   Smokeless tobacco: Never  Vaping Use   Vaping status: Every Day   Substances: Nicotine, Flavoring, Nicotine-salt   Devices: unsure name  Substance  and Sexual Activity   Alcohol use: Yes    Comment: rarely   Drug use: Never   Sexual activity: Yes    Birth control/protection: Pill  Other Topics Concern   Not on file  Social History Narrative   Patient does not live alone,    Patient does not work at this time.    Social Drivers of Health   Tobacco Use: Medium Risk (10/27/2024)   Patient History    Smoking Tobacco Use: Former    Smokeless Tobacco Use: Never  Passive Exposure: Not on file  Financial Resource Strain: Medium Risk (10/28/2024)   Overall Financial Resource Strain (CARDIA)    Difficulty of Paying Living Expenses: Somewhat hard  Food Insecurity: No Food Insecurity (10/28/2024)   Epic    Worried About Programme Researcher, Broadcasting/film/video in the Last Year: Never true    Ran Out of Food in the Last Year: Never true  Transportation Needs: No Transportation Needs (10/28/2024)   Epic    Lack of Transportation (Medical): No    Lack of Transportation (Non-Medical): No  Physical Activity: Insufficiently Active (10/28/2024)   Exercise Vital Sign    Days of Exercise per Week: 1 day    Minutes of Exercise per Session: 10 min  Stress: Stress Concern Present (10/28/2024)   [employer] of Occupational Health - Occupational Stress Questionnaire    Feeling of Stress: To some extent  Social Connections: Moderately Isolated (10/28/2024)   Social Connection and Isolation Panel    Frequency of Communication with Friends and Family: More than three times a week    Frequency of Social Gatherings with Friends and Family: More than three times a week    Attends Religious Services: Patient declined    Active Member of Clubs or Organizations: No    Attends Engineer, Structural: Not on file    Marital Status: Married  Catering Manager Violence: Not on file  Depression (PHQ2-9): Low Risk (10/28/2024)   Depression (PHQ2-9)    PHQ-2 Score: 0  Alcohol Screen: Low Risk (10/28/2024)   Alcohol Screen    Last Alcohol Screening Score  (AUDIT): 1  Housing: Low Risk (10/28/2024)   Epic    Unable to Pay for Housing in the Last Year: No    Number of Times Moved in the Last Year: 0    Homeless in the Last Year: No  Utilities: Not on file  Health Literacy: Not on file      Review of Systems  All other systems reviewed and are negative.      Objective:   Physical Exam Constitutional:      General: She is not in acute distress.    Appearance: Normal appearance. She is obese. She is not ill-appearing, toxic-appearing or diaphoretic.  HENT:     Right Ear: Tympanic membrane and ear canal normal.     Left Ear: Tympanic membrane and ear canal normal.     Nose: No congestion or rhinorrhea.     Mouth/Throat:     Pharynx: No oropharyngeal exudate or posterior oropharyngeal erythema.  Cardiovascular:     Rate and Rhythm: Normal rate and regular rhythm.     Heart sounds: Normal heart sounds. No murmur heard. Pulmonary:     Effort: Pulmonary effort is normal. No respiratory distress.     Breath sounds: No stridor. No wheezing, rhonchi or rales.  Musculoskeletal:     Right shoulder: Tenderness present. No swelling, deformity or effusion. Decreased range of motion. Normal strength.     Right lower leg: No edema.     Left lower leg: No edema.  Lymphadenopathy:     Cervical: No cervical adenopathy.  Neurological:     General: No focal deficit present.     Mental Status: She is alert and oriented to person, place, and time. Mental status is at baseline.     Cranial Nerves: No cranial nerve deficit.         Assessment & Plan:  Shortness of breath - Plan: Influenza A and B Ag, Immunoassay,  DG Chest 2 View  SOB (shortness of breath) - Plan: Comprehensive metabolic panel with GFR, Brain natriuretic peptide, TSH  Weight gain - Plan: Cortisol Begin workup for shortness of breath by checking a BNP given her reported swelling and fluid retention although her exam today is not significant for this.  If BNP is elevated  consider echocardiogram.  I suspect her shortness of breath is likely related to morbid obesity causing obesity hypoventilation syndrome/restrictive lung disease which is why her symptoms are more present when she is supine.  I also believe there may be an underlying element of COPD given her smoking history and vaping history.  I will check a chest x-ray although her pulmonary exam today is normal.  I gave the patient samples of breztri 2 puffs inhaled twice daily.  Recheck in 2 weeks.  If the shortness of breath improves with this sample, I would recommend formal pulmonary function test through pulmonology.  I would also recommend weight loss.  Patient states that she has tried and failed diet and exercise.  We discussed paying cash for oral Wegovy  when it becomes available and she is interested in this.  Given her significant weight gain, I will check a CMP and a TSH.  Recent CBC was unremarkable.  I will also check a cortisol level to evaluate for Cushing syndrome.  She does have body features consistent with this but I believe it is the iatrogenic related to steroid use from her history of MS.  Once she is off dexamethasone , I want her to be off the medication for 1 week.  She will then take 1 mg of dexamethasone  at night and return to our lab the following morning to check a serum cortisol level to evaluate for Cushing syndrome. "

## 2024-10-29 ENCOUNTER — Encounter: Payer: Self-pay | Admitting: Family Medicine

## 2024-10-29 LAB — BRAIN NATRIURETIC PEPTIDE: Brain Natriuretic Peptide: 25 pg/mL

## 2024-10-29 LAB — COMPREHENSIVE METABOLIC PANEL WITH GFR
AG Ratio: 1.9 (calc) (ref 1.0–2.5)
ALT: 31 U/L — ABNORMAL HIGH (ref 6–29)
AST: 19 U/L (ref 10–30)
Albumin: 4.4 g/dL (ref 3.6–5.1)
Alkaline phosphatase (APISO): 107 U/L (ref 31–125)
BUN/Creatinine Ratio: 15 (calc) (ref 6–22)
BUN: 15 mg/dL (ref 7–25)
CO2: 26 mmol/L (ref 20–32)
Calcium: 9.7 mg/dL (ref 8.6–10.2)
Chloride: 107 mmol/L (ref 98–110)
Creat: 1.03 mg/dL — ABNORMAL HIGH (ref 0.50–0.99)
Globulin: 2.3 g/dL (ref 1.9–3.7)
Glucose, Bld: 89 mg/dL (ref 65–99)
Potassium: 4.9 mmol/L (ref 3.5–5.3)
Sodium: 141 mmol/L (ref 135–146)
Total Bilirubin: 0.3 mg/dL (ref 0.2–1.2)
Total Protein: 6.7 g/dL (ref 6.1–8.1)
eGFR: 69 mL/min/1.73m2

## 2024-10-29 LAB — TSH: TSH: 0.63 m[IU]/L

## 2024-10-31 ENCOUNTER — Ambulatory Visit: Payer: Self-pay | Admitting: Family Medicine

## 2024-10-31 ENCOUNTER — Other Ambulatory Visit: Payer: Self-pay

## 2024-10-31 MED ORDER — VARENICLINE TARTRATE (STARTER) 0.5 MG X 11 & 1 MG X 42 PO TBPK: ORAL_TABLET | ORAL | 0 refills | Status: AC

## 2024-11-03 ENCOUNTER — Encounter: Payer: Self-pay | Admitting: Family Medicine

## 2024-11-04 ENCOUNTER — Ambulatory Visit
Admission: RE | Admit: 2024-11-04 | Discharge: 2024-11-04 | Disposition: A | Source: Ambulatory Visit | Attending: Family Medicine | Admitting: Family Medicine

## 2024-11-04 DIAGNOSIS — R0602 Shortness of breath: Secondary | ICD-10-CM

## 2024-11-05 ENCOUNTER — Ambulatory Visit

## 2024-11-07 ENCOUNTER — Other Ambulatory Visit: Payer: Self-pay

## 2024-11-07 DIAGNOSIS — R0602 Shortness of breath: Secondary | ICD-10-CM

## 2024-11-07 DIAGNOSIS — R0789 Other chest pain: Secondary | ICD-10-CM

## 2024-11-10 ENCOUNTER — Encounter: Payer: Self-pay | Admitting: Family Medicine

## 2024-11-10 ENCOUNTER — Ambulatory Visit (INDEPENDENT_AMBULATORY_CARE_PROVIDER_SITE_OTHER): Admitting: Family Medicine

## 2024-11-10 VITALS — BP 118/64 | HR 84 | Temp 98.6°F | Ht 64.0 in | Wt 252.0 lb

## 2024-11-10 DIAGNOSIS — R0600 Dyspnea, unspecified: Secondary | ICD-10-CM

## 2024-11-10 DIAGNOSIS — R0789 Other chest pain: Secondary | ICD-10-CM

## 2024-11-10 DIAGNOSIS — Z23 Encounter for immunization: Secondary | ICD-10-CM

## 2024-11-10 DIAGNOSIS — R635 Abnormal weight gain: Secondary | ICD-10-CM | POA: Diagnosis not present

## 2024-11-10 MED ORDER — DIAZEPAM 5 MG PO TABS: 5.0000 mg | ORAL_TABLET | Freq: Two times a day (BID) | ORAL | 1 refills | Status: AC | PRN

## 2024-11-10 NOTE — Progress Notes (Signed)
 "  Subjective:    Patient ID: Virginia [name], female    DOB: Jul 18, 1981, 44 y.o.   MRN: [id number] 10/28/24 Patient reports increasing dyspnea.  She states that when she lays supine at night, she feels like she is suffocating and is smothering.  If she sits up, she still feels like she is suffocating and smothering.  It is most pronounced at night.  During the day however she reports dyspnea on exertion.  She has a 20-pack-year history of smoking.  She quit approximately 10 years ago and started vaping.  She has been vaping for the last 10 years.  She does state that she has occasional wheezing but denies any purulent sputum or hemoptysis or fever or chills.  She denies any angina.  She denies any chest pain or chest pressure.  She does report significant weight gain.  She is currently on dexamethasone  4 MS. Wt Readings from Last 3 Encounters:  11/10/24 252 lb (114.3 kg)  10/28/24 250 lb (113.4 kg)  10/27/24 253 lb 12.8 oz (115.1 kg)   Despite diet and exercise the patient states she has been unable to lose weight.  She denies any pleurisy or hemoptysis.  She does report a pump forming on the back of her neck.  She also has a moon like facies.  She does report hirsutism on her upper lip although this is not obvious today on exam.  She reports decreased and infrequent menstrual cycles however she attributes this to her birth control.  At that time, my plan was: Begin workup for shortness of breath by checking a BNP given her reported swelling and fluid retention although her exam today is not significant for this.  If BNP is elevated consider echocardiogram.  I suspect her shortness of breath is likely related to morbid obesity causing obesity hypoventilation syndrome/restrictive lung disease which is why her symptoms are more present when she is supine.  I also believe there may be an underlying element of COPD given her smoking history and vaping history.  I will check a chest x-ray although her pulmonary  exam today is normal.  I gave the patient samples of breztri 2 puffs inhaled twice daily.  Recheck in 2 weeks.  If the shortness of breath improves with this sample, I would recommend formal pulmonary function test through pulmonology.  I would also recommend weight loss.  Patient states that she has tried and failed diet and exercise.  We discussed paying cash for oral Wegovy  when it becomes available and she is interested in this.  Given her significant weight gain, I will check a CMP and a TSH.  Recent CBC was unremarkable.  I will also check a cortisol level to evaluate for Cushing syndrome.  She does have body features consistent with this but I believe it is the iatrogenic related to steroid use from her history of MS.  Once she is off dexamethasone , I want her to be off the medication for 1 week.  She will then take 1 mg of dexamethasone  at night and return to our lab the following morning to check a serum cortisol level to evaluate for Cushing syndrome.  11/10/24 Patient states that when she tried the breztri, she felt like there was a brick on her chest.  She could not breathe.  She tried it several times and had the same reaction every time.  She continues to have severe shortness of breath.  We have recommended that she go to the emergency room.  However  the patient made an appointment today to discuss.  EKG shows normal sinus rhythm with a rate of 84 bpm.  The patient has normal intervals and a normal axis.  There is no evidence of ischemia or infarction.  She states that even right now she feels short of breath.  She feels like she is not able to catch her breath even at rest.  [name] made this worse.  Chest x-ray was unremarkable.  BNP was normal.   Past Medical History:  Diagnosis Date   Arthritis    Chronic migraine w/o aura w/o status migrainosus, not intractable    followed by dr vear   GAD (generalized anxiety disorder)    GERD (gastroesophageal reflux disease)    Hemorrhoids     History of cervical dysplasia    followed by GYN--- dr jonelle. harris;    hx CIN 2  s/p  colposcopy in office 05/ 2022 and CIN 3  s/p LEEP 11/ 2022   History of syncope 02/2016   admission in epic;  in setting of vertigo and dx w/ MS   Insomnia    Multiple sclerosis, relapsing-remitting 02/2016   neurologist--- dr vear;  dx 05/ 2017 lumbar puncture,  dyesthesia, fatigue, vertigo   PAC (premature atrial contraction)    PSVT (paroxysmal supraventricular tachycardia) 2019   (02-01-2023 pt stated followed by pcp) // cardiologist--- dr g. taylor;  (lov in epic 06-21-2018)  evaluated 2019 after dx MS in 2017 with syncope/ palpitations;   event monitor showed PSVT,  PACs/ PVCs   PVC (premature ventricular contraction)    Sleep apnea, unspecified    study in epic 08-07-2018  borderline sleep apnea,  recommendation loss wt, sleep hygiene, mouth guard   Past Surgical History:  Procedure Laterality Date   COLONOSCOPY WITH ESOPHAGOGASTRODUODENOSCOPY (EGD)  11/15/2022   dr legrand;   EVALUATION UNDER ANESTHESIA WITH HEMORRHOIDECTOMY N/A 02/08/2023   Procedure: ANORECTAL EXAM UNDER ANESTHESIA;  Surgeon: Teresa Lonni HERO, MD;  Location: [facility];  Service: General;  Laterality: N/A;   HEMORRHOID SURGERY  02/08/2023   Procedure: EXTERNAL HEMORRHOIDECTOMY X2;  Surgeon: Teresa Lonni HERO, MD;  Location: [facility];  Service: General;;   LEEP N/A 09/20/2021   Procedure: LOOP ELECTROSURGICAL EXCISION PROCEDURE (LEEP);  Surgeon: Arloa Lamar SQUIBB, MD;  Location: [facility];  Service: Gynecology;  Laterality: N/A;   Current Outpatient Medications on File Prior to Visit  Medication Sig Dispense Refill   albuterol  (VENTOLIN  HFA) 108 (90 Base) MCG/ACT inhaler Inhale 2 puffs into the lungs every 4 (four) hours as needed.     atenolol  (TENORMIN ) 50 MG tablet TAKE 1 TABLET BY MOUTH EVERY DAY 90 tablet 0   baclofen  (LIORESAL ) 20 MG tablet TAKE 2 TABLETS BY MOUTH 3 TIMES A DAY FOR  SPASTICITY 270 tablet 3   clonazePAM  (KLONOPIN ) 1 MG tablet TAKE 1 TABLET BY MOUTH 2 TIMES DAILY AS NEEDED FOR ANXIETY. 30 tablet 3   dexamethasone  (DECADRON ) 1 MG tablet Take 1 tablet (1 mg total) by mouth at bedtime. 1 tablet 0   doxylamine , Sleep, (UNISOM  SLEEPTABS) 25 MG tablet Take 2 tablets (50 mg total) by mouth at bedtime as needed. 30 tablet 0   DULoxetine  (CYMBALTA ) 60 MG capsule TAKE 1 CAPSULE BY MOUTH EVERY DAY 90 capsule 0   gabapentin  (NEURONTIN ) 600 MG tablet Take 1 tablet (600 mg total) by mouth daily with breakfast AND 1 tablet (600 mg total) daily with lunch AND 1 tablet (600 mg total) daily  after supper AND 2 tablets (1,200 mg total) at bedtime. 150 tablet 5   KESIMPTA  20 MG/0.4ML SOAJ INJECT 1 PEN UNDER THE SKIN EVERY MONTH 0.4 mL 4   meclizine  (ANTIVERT ) 25 MG tablet Take 1 tablet (25 mg total) by mouth 3 (three) times daily as needed for dizziness. 30 tablet 11   methylPREDNISolone  (MEDROL  DOSEPAK) 4 MG TBPK tablet Taper as directed 1 each 0   norethindrone  (MICRONOR ) 0.35 MG tablet Take 1 tablet (0.35 mg total) by mouth daily. 84 tablet 3   Oxcarbazepine  (TRILEPTAL ) 300 MG tablet 1 p.o. every morning and 2 p.o. nightly 90 tablet 11   pantoprazole  (PROTONIX ) 40 MG tablet Take 1 tablet (40 mg total) by mouth 2 (two) times daily. 180 tablet 0   SUMAtriptan  (IMITREX ) 50 MG tablet TAKE 1 TABLET EVERY 2 HRS AS NEEDED FOR MIGRAINE. MAY REPEAT IN 2 HRS IF HEADACHE PERSISTS OR RECURS 10 tablet 1   tiZANidine  (ZANAFLEX ) 4 MG tablet One po bid 180 tablet 3   Varenicline  Tartrate, Starter, (CHANTIX  STARTING MONTH PAK) 0.5 MG X 11 & 1 MG X 42 TBPK Use as directed 53 each 0   No current facility-administered medications on file prior to visit.   Allergies  Allergen Reactions   Codeine Other (See Comments)    Hallucinations    Onion Swelling   Topamax  [Topiramate ]     Was talking out of her head, walked into walls   Social History   Socioeconomic History   Marital status:  Married    Spouse name: Not on file   Number of children: Not on file   Years of education: Not on file   Highest education level: 12th grade  Occupational History   Not on file  Tobacco Use   Smoking status: Former    Current packs/day: 0.00    Average packs/day: 0.5 packs/day for 10.0 years (5.0 ttl pk-yrs)    Types: Cigarettes    Start date: 2005    Quit date: 2015    Years since quitting: 11.0   Smokeless tobacco: Never  Vaping Use   Vaping status: Every Day   Substances: Nicotine, Flavoring, Nicotine-salt   Devices: unsure name  Substance and Sexual Activity   Alcohol use: Yes    Comment: rarely   Drug use: Never   Sexual activity: Yes    Birth control/protection: Pill  Other Topics Concern   Not on file  Social History Narrative   Patient does not live alone,    Patient does not work at this time.    Social Drivers of Health   Tobacco Use: Medium Risk (11/10/2024)   Patient History    Smoking Tobacco Use: Former    Smokeless Tobacco Use: Never    Passive Exposure: Not on file  Financial Resource Strain: Medium Risk (10/28/2024)   Overall Financial Resource Strain (CARDIA)    Difficulty of Paying Living Expenses: Somewhat hard  Food Insecurity: No Food Insecurity (10/28/2024)   Epic    Worried About Programme Researcher, Broadcasting/film/video in the Last Year: Never true    Ran Out of Food in the Last Year: Never true  Transportation Needs: No Transportation Needs (10/28/2024)   Epic    Lack of Transportation (Medical): No    Lack of Transportation (Non-Medical): No  Physical Activity: Insufficiently Active (10/28/2024)   Exercise Vital Sign    Days of Exercise per Week: 1 day    Minutes of Exercise per Session: 10 min  Stress: Stress Concern  Present (10/28/2024)   [employer] of Occupational Health - Occupational Stress Questionnaire    Feeling of Stress: To some extent  Social Connections: Moderately Isolated (10/28/2024)   Social Connection and Isolation Panel     Frequency of Communication with Friends and Family: More than three times a week    Frequency of Social Gatherings with Friends and Family: More than three times a week    Attends Religious Services: Patient declined    Active Member of Clubs or Organizations: No    Attends Engineer, Structural: Not on file    Marital Status: Married  Catering Manager Violence: Not on file  Depression (PHQ2-9): Low Risk (10/28/2024)   Depression (PHQ2-9)    PHQ-2 Score: 0  Alcohol Screen: Low Risk (10/28/2024)   Alcohol Screen    Last Alcohol Screening Score (AUDIT): 1  Housing: Low Risk (10/28/2024)   Epic    Unable to Pay for Housing in the Last Year: No    Number of Times Moved in the Last Year: 0    Homeless in the Last Year: No  Utilities: Not on file  Health Literacy: Not on file      Review of Systems  All other systems reviewed and are negative.      Objective:   Physical Exam Constitutional:      General: She is not in acute distress.    Appearance: Normal appearance. She is obese. She is not ill-appearing, toxic-appearing or diaphoretic.  HENT:     Right Ear: Tympanic membrane and ear canal normal.     Left Ear: Tympanic membrane and ear canal normal.     Nose: No congestion or rhinorrhea.     Mouth/Throat:     Pharynx: No oropharyngeal exudate or posterior oropharyngeal erythema.  Cardiovascular:     Rate and Rhythm: Normal rate and regular rhythm.     Heart sounds: Normal heart sounds. No murmur heard. Pulmonary:     Effort: Pulmonary effort is normal. No respiratory distress.     Breath sounds: No stridor. No wheezing, rhonchi or rales.  Musculoskeletal:     Right shoulder: Tenderness present. No swelling, deformity or effusion. Decreased range of motion. Normal strength.     Right lower leg: No edema.     Left lower leg: No edema.  Lymphadenopathy:     Cervical: No cervical adenopathy.  Neurological:     General: No focal deficit present.     Mental Status:  She is alert and oriented to person, place, and time. Mental status is at baseline.     Cranial Nerves: No cranial nerve deficit.         Assessment & Plan:  Dyspnea, unspecified type - Plan: EKG 12-Lead, D-dimer, quantitative, Troponin T, High Sensitivity (hs-TnT)  Chest tightness - Plan: D-dimer, quantitative, Troponin T, High Sensitivity (hs-TnT)  Weight gain - Plan: Cortisol  I believe that she had worsening shortness of breath with breztri likely due to anxiety.  I believe that her shortness of breath is related to obesity hypoventilation and that anxiety makes the shortness of breath worse.  The patient states that that is possible.  We will discontinue Klonopin  and we will try Valium  5 mg as needed shortness of breath or anxiety.  If the patient sees significant benefit from the Valium , I think this would help prove that the shortness of breath is anxiety related.  I will consult cardiology for a stress test although I think the pretest probability is low.  However her mother has a history of heart disease.  I will check a troponin and a D-dimer today.  Certainly if the troponin is elevated the patient will be instructed to go to the hospital.  However I do not believe that the patient has been having cardiac related chest discomfort and tightness for weeks related to her heart.  I think we need to focus on weight loss.  Patient is willing to try Wegovy  1.5 mg daily.  We will uptitrate to 4 mg in 1 month if the patient is tolerating the medication.  She is willing to pay cash for this.  I will check a cortisol level to rule out Cushing syndrome. "

## 2024-11-10 NOTE — Addendum Note (Signed)
 Addended by: ANGELENA RONAL BRADLEY K on: 11/10/2024 04:53 PM   Modules accepted: Orders

## 2024-11-11 ENCOUNTER — Ambulatory Visit: Payer: Self-pay | Admitting: Family Medicine

## 2024-11-12 LAB — EXTRA SPECIMEN

## 2024-11-12 LAB — CORTISOL: Cortisol, Plasma: 2.8 ug/dL — ABNORMAL LOW

## 2024-11-12 LAB — TROPONIN T, HIGH SENSITIVITY (HS-TNT)

## 2024-11-12 LAB — D-DIMER, QUANTITATIVE: D-Dimer, Quant: 0.43 ug{FEU}/mL

## 2024-11-13 ENCOUNTER — Telehealth: Payer: Self-pay | Admitting: *Deleted

## 2024-11-13 ENCOUNTER — Encounter: Payer: Self-pay | Admitting: Family Medicine

## 2024-11-13 ENCOUNTER — Other Ambulatory Visit: Payer: Self-pay | Admitting: Neurology

## 2024-11-13 NOTE — Telephone Encounter (Signed)
 Message sent to PA team.

## 2024-11-13 NOTE — Telephone Encounter (Signed)
 Virginia [name]

## 2024-11-14 ENCOUNTER — Other Ambulatory Visit: Payer: Self-pay

## 2024-11-14 DIAGNOSIS — R0600 Dyspnea, unspecified: Secondary | ICD-10-CM

## 2024-11-14 DIAGNOSIS — F410 Panic disorder [episodic paroxysmal anxiety] without agoraphobia: Secondary | ICD-10-CM

## 2024-11-14 DIAGNOSIS — R0789 Other chest pain: Secondary | ICD-10-CM

## 2024-11-14 MED ORDER — HYDROXYZINE PAMOATE 25 MG PO CAPS: 25.0000 mg | ORAL_CAPSULE | Freq: Three times a day (TID) | ORAL | 1 refills | Status: AC | PRN

## 2024-11-17 ENCOUNTER — Telehealth: Payer: Self-pay | Admitting: Family Medicine

## 2024-11-17 NOTE — Telephone Encounter (Signed)
Referral for neurosurgery fax to [facility]. Phone: [phone], Fax: [phone]

## 2024-11-18 ENCOUNTER — Ambulatory Visit
Admission: RE | Admit: 2024-11-18 | Discharge: 2024-11-18 | Disposition: A | Source: Ambulatory Visit | Attending: Obstetrics and Gynecology

## 2024-11-18 DIAGNOSIS — Z1231 Encounter for screening mammogram for malignant neoplasm of breast: Secondary | ICD-10-CM

## 2024-11-19 ENCOUNTER — Encounter: Payer: Self-pay | Admitting: Cardiology

## 2024-11-19 ENCOUNTER — Telehealth: Payer: Self-pay

## 2024-11-19 ENCOUNTER — Ambulatory Visit: Admitting: Cardiology

## 2024-11-19 ENCOUNTER — Other Ambulatory Visit (HOSPITAL_COMMUNITY): Payer: Self-pay

## 2024-11-19 VITALS — BP 118/82 | HR 72 | Resp 16 | Ht 64.0 in | Wt 251.4 lb

## 2024-11-19 DIAGNOSIS — E66813 Obesity, class 3: Secondary | ICD-10-CM | POA: Diagnosis not present

## 2024-11-19 DIAGNOSIS — Z6841 Body Mass Index (BMI) 40.0 and over, adult: Secondary | ICD-10-CM

## 2024-11-19 DIAGNOSIS — R0609 Other forms of dyspnea: Secondary | ICD-10-CM

## 2024-11-19 DIAGNOSIS — R0683 Snoring: Secondary | ICD-10-CM | POA: Diagnosis not present

## 2024-11-19 DIAGNOSIS — G35A Relapsing-remitting multiple sclerosis: Secondary | ICD-10-CM

## 2024-11-19 NOTE — Progress Notes (Signed)
 " Cardiology Office Note:  .   Date:  11/19/2024  ID:  Virginia [name], DOB 03-May-1981, MRN [id number] PCP: Duanne Butler DASEN, MD  [facility] Providers Cardiologist:  Gordy Bergamo, MD   History of Present Illness: .   Virginia [name] is a 44 y.o. female patient with multiple sclerosis, chronic palpitations, felt to be due to PACs and PVCs, last evaluated by Dr. Cathlyn Birmingham on 06/21/2018 and recommended continuing atenolol  and consider flecainide if symptoms did not resolve.    She was evaluated by her PCP Dr. Charlena Duanne for worsening dyspnea.  She now presents to establish care.  Past medical history significant for multiple sclerosis, obstructive sleep apnea, mild; GERD, morbid obesity with obesity hypoventilation, prior tobacco use now vaping and trying to quit.    Discussed the use of AI scribe software for clinical note transcription with the patient, who gave verbal consent to proceed.  History of Present Illness Virginia [name] is a 44 year old female with multiple sclerosis who presents with shortness of breath and sleep disturbances. She was referred by her primary doctor to rule out heart-related causes for her symptoms.  She has nightly shortness of breath when lying down with a sensation of suffocation and sometimes with exertion, describing a need to take double breaths. She wakes from sleep with shortness of breath and loud snoring.  She smoked half a pack per day until quitting in 2017. She currently vapes and started Chantix  about a week ago to quit. Her husband smokes but not in the house.  She has had significant weight gain and swelling. She is overweight, snores heavily, and often wakes from sleep due to snoring and shortness of breath.  She takes atenolol  for prior palpitations, which have resolved, and Kesimpta  for multiple sclerosis.  A home sleep study was ordered by neurology in December, but she has not yet received it and has contacted the office to follow  up.  Cardiac Studies relevent.    Cardiac Studies & Procedures   ______________________________________________________________________________________________ ECHOCARDIOGRAM COMPLETE 03/01/2018 - Left ventricle: The cavity size was normal. Systolic function was normal. The estimated ejection fraction was in the range of 55% to 60%. Wall motion was normal; there were no regional wall motion abnormalities. Left ventricular diastolic function parameters were normal. - Mitral valve: There was trivial regurgitation. - Tricuspid valve: There was trivial regurgitation. - Pulmonic valve: There was trivial regurgitation.  MR CARDIAC MORPHOLOGY W WO CONTRAST 03/19/2018 1. Normal left ventricular size, thickness and systolic function (LVEF = 58%). There are no regional wall motion abnormalities.  There is no late gadolinium enhancement in the left ventricular myocardium. 2. Normal right ventricular size, thickness and systolic function (LVEF = 60%). There are no regional wall motion abnormalities. 3. Normal cardiac MRI. ______________________________________________________________________________________________    EKG:     EKG 11/10/2024: Normal sinus rhythm at rate of 84 bpm, normal EKG. Labs   Lab Results  Component Value Date   CHOL 109 07/25/2022   HDL 35 (L) 07/25/2022   LDLCALC 58 07/25/2022   TRIG 76 07/25/2022   CHOLHDL 3.1 07/25/2022   No results found for: LIPOA  Recent Labs    10/28/24 1634  NA 141  K 4.9  CL 107  CO2 26  GLUCOSE 89  BUN 15  CREATININE 1.03*  CALCIUM 9.7    Lab Results  Component Value Date   ALT 31 (H) 10/28/2024   AST 19 10/28/2024   ALKPHOS 77 07/25/2022   BILITOT 0.3  10/28/2024      Latest Ref Rng & Units 10/27/2024    3:07 PM 03/26/2024    3:04 PM 09/11/2023    1:32 PM  CBC  WBC 3.4 - 10.8 x10E3/uL 9.3  7.9  7.7   Hemoglobin 11.1 - 15.9 g/dL 84.9  85.8  83.7   Hematocrit 34.0 - 46.6 % 45.0  42.7  47.7   Platelets 150 - 450 x10E3/uL  400  328  358    Lab Results  Component Value Date   HGBA1C 5.3 07/25/2022    Lab Results  Component Value Date   TSH 0.63 10/28/2024     ROS  Review of Systems  Cardiovascular:  Positive for dyspnea on exertion. Negative for chest pain, leg swelling, orthopnea and paroxysmal nocturnal dyspnea.  Respiratory:  Positive for sleep disturbances due to breathing and snoring.    Physical Exam:   VS:  BP 118/82 (BP Location: Left Arm, Patient Position: Sitting, Cuff Size: Large)   Pulse 72   Resp 16   Ht 5' 4 (1.626 m)   Wt 251 lb 6.4 oz (114 kg)   SpO2 96%   BMI 43.15 kg/m    Wt Readings from Last 3 Encounters:  11/19/24 251 lb 6.4 oz (114 kg)  11/10/24 252 lb (114.3 kg)  10/28/24 250 lb (113.4 kg)    BP Readings from Last 3 Encounters:  11/19/24 118/82  11/10/24 118/64  10/28/24 132/80   Physical Exam Constitutional:      Appearance: She is morbidly obese.  Neck:     Vascular: No carotid bruit or JVD.  Cardiovascular:     Rate and Rhythm: Normal rate and regular rhythm.     Pulses: Intact distal pulses.     Heart sounds: Normal heart sounds. No murmur heard.    No gallop.  Pulmonary:     Effort: Pulmonary effort is normal.     Breath sounds: Normal breath sounds.  Abdominal:     General: Bowel sounds are normal.     Palpations: Abdomen is soft.  Musculoskeletal:     Right lower leg: No edema.     Left lower leg: No edema.    ASSESSMENT AND PLAN: .      ICD-10-CM   1. Dyspnea on exertion  R06.09 ECHOCARDIOGRAM COMPLETE    2. Class 3 severe obesity due to excess calories without serious comorbidity with body mass index (BMI) of 40.0 to 44.9 in adult [facility])  Z33.186    Z68.41     3. Loud snoring  R06.83     4. Multiple sclerosis, relapsing-remitting  G35.A      Assessment & Plan Obstructive and central sleep apnea Reports nocturnal dyspnea and snoring, suggestive of obstructive sleep apnea. Central sleep apnea is suspected due to multiple sclerosis. A  home sleep study has been ordered by her neurologist but not yet completed. No evidence of heart failure on physical exam and EKG. - Ensure completion of home sleep study as ordered by neurologist. - Symptoms drop suggest PND and orthopnea.  No clinical evidence of heart failure.  Class 3 severe obesity (BMI 40-44.9) BMI is 43, indicating class 3 severe obesity. Reports difficulty losing weight despite dietary efforts. Obesity is a risk factor for obstructive sleep apnea and other health issues. Discussed the importance of dietary changes and potential use of Wegovy  for weight loss. - Consider Wegovy  for weight loss. - Encouraged dietary changes and reduction of outside food consumption.  Relapsing-remitting multiple sclerosis Managed  by a neurologist. Central sleep apnea is a potential complication of multiple sclerosis. - Continue follow-up with neurologist for management of multiple sclerosis.  Palpitations, controlled with atenolol  Palpitations are well-controlled with atenolol .  Previously evaluated by EP and felt to be due to benign PACs and PVCs.  No recent episodes of palpitations reported. - Continue atenolol  for palpitations.  Follow up: I will personally perform the test and if I find abnormalities,  will perform further evaluation. Otherwise unless new on ongoing symptoms(patient advised to contact us ), preventive  therapy is recommended. I will then see the patient on a PRN basis.   Signed,  Gordy Bergamo, MD, [facility] 11/19/2024, 7:00 PM [facility] [address] [place], [place] [address] Phone: [phone]. Fax:  [phone]  "

## 2024-11-19 NOTE — Patient Instructions (Addendum)
 Medication Instructions:  Your physician recommends that you continue on your current medications as directed. Please refer to the Current Medication list given to you today.   *If you need a refill on your cardiac medications before your next appointment, please call your pharmacy*   Testing/Procedures: ECHOCARDIOGRAM  Your physician has requested that you have an echocardiogram. Echocardiography is a painless test that uses sound waves to create images of your heart. It provides your doctor with information about the size and shape of your heart and how well your hearts chambers and valves are working. This procedure takes approximately one hour. There are no restrictions for this procedure. Please do NOT wear cologne, perfume, aftershave, or lotions (deodorant is allowed). Please arrive 15 minutes prior to your appointment time.  Please note: We ask at that you not bring children with you during ultrasound (echo/ vascular) testing. Due to room size and safety concerns, children are not allowed in the ultrasound rooms during exams. Our front office staff cannot provide observation of children in our lobby area while testing is being conducted. An adult accompanying a patient to their appointment will only be allowed in the ultrasound room at the discretion of the ultrasound technician under special circumstances. We apologize for any inconvenience.   Follow-Up: At [facility], you and your health needs are our priority.  As part of our continuing mission to provide you with exceptional heart care, our providers are all part of one team.  This team includes your primary Cardiologist (physician) and Advanced Practice Providers or APPs (Physician Assistants and Nurse Practitioners) who all work together to provide you with the care you need, when you need it.  Your next appointment:    As Needed  Provider:   Gordy Bergamo, MD          We recommend signing up for the patient portal  called MyChart.  Patients are able to view lab/test results, encounter notes, upcoming appointments, etc.  Non-urgent messages can be sent to your provider as well, go to [email or web address].

## 2024-11-19 NOTE — Telephone Encounter (Signed)
 Pharmacy Patient Advocate Encounter   Received notification from Pt Calls Messages that prior authorization for OXcarbazepine  300MG  tablets is required/requested.   Insurance verification completed.   The patient is insured through [employer].   Per test claim: PA required; PA submitted to above mentioned insurance via Latent Key/confirmation #/EOC [id number] Status is pending

## 2024-11-20 ENCOUNTER — Ambulatory Visit: Admitting: Neurology

## 2024-11-20 ENCOUNTER — Other Ambulatory Visit: Payer: Self-pay | Admitting: Family Medicine

## 2024-11-20 ENCOUNTER — Other Ambulatory Visit (HOSPITAL_COMMUNITY): Payer: Self-pay

## 2024-11-20 DIAGNOSIS — G4733 Obstructive sleep apnea (adult) (pediatric): Secondary | ICD-10-CM

## 2024-11-20 DIAGNOSIS — R0683 Snoring: Secondary | ICD-10-CM

## 2024-11-20 DIAGNOSIS — R5383 Other fatigue: Secondary | ICD-10-CM

## 2024-11-20 NOTE — Telephone Encounter (Signed)
 PA has been submitted, and telephone encounter has been created. Please see telephone encounter dated 1.21.26.

## 2024-11-20 NOTE — Telephone Encounter (Signed)
 Too soon for refill.  Requested Prescriptions  Pending Prescriptions Disp Refills   Varenicline  Tartrate, Starter, 0.5 MG X 11 & 1 MG X 42 TBPK [Pharmacy Med Name: VARENICLINE  STARTING MONTH BOX] 53 each 0    Sig: USE AS DIRECTED     Psychiatry:  Drug Dependence Therapy - varenicline  Failed - 11/20/2024 12:23 PM      Failed - Manual Review: Do not refill starter pack. 1 mg tabs may be extended up to one year if the patient has quit smoking but still feels at risk for relapse.      Failed - Cr in normal range and within 180 days    Creat  Date Value Ref Range Status  10/28/2024 1.03 (H) 0.50 - 0.99 mg/dL Final   Creatinine, Urine  Date Value Ref Range Status  07/26/2021 43 20 - 275 mg/dL Final         Passed - Completed PHQ-2 or PHQ-9 in the last 360 days      Passed - Valid encounter within last 6 months    Recent Outpatient Visits           1 week ago Dyspnea, unspecified type   [facility] Duanne Butler DASEN, MD   3 weeks ago Shortness of breath   [facility] Duanne, Butler DASEN, MD   4 months ago Bronchitis   [place] Duanne Butler DASEN, MD   7 months ago Acute pain of right shoulder    [facility] Duanne Butler DASEN, MD   1 year ago Polycythemia    [facility] [place], Butler DASEN, MD

## 2024-11-24 ENCOUNTER — Other Ambulatory Visit (HOSPITAL_COMMUNITY): Payer: Self-pay

## 2024-11-24 NOTE — Telephone Encounter (Signed)
 Pharmacy Patient Advocate Encounter  Received notification from [facility] [employer] that Prior Authorization for OXcarbazepine  300MG  tablets has been APPROVED from 11-24-2024 to 11-24-2025. Ran test claim, Copay is $5.00 for a 30 day supply. This test claim was processed through [facility]- copay amounts may vary at other pharmacies due to pharmacy/plan contracts, or as the patient moves through the different stages of their insurance plan.   PA #/Case ID/Reference #: [employer] will only pay for 30 days at a time.

## 2024-11-27 NOTE — Progress Notes (Signed)
 See procedure note.

## 2024-11-28 ENCOUNTER — Ambulatory Visit: Payer: Self-pay | Admitting: Obstetrics and Gynecology

## 2024-11-28 NOTE — Procedures (Signed)
 "   [facility]  HOME SLEEP TEST (SANSA) REPORT (Mail-Out Device):   STUDY DATE: 11/26/2024  DOB: 29-May-1981  MRN: [id number]  ORDERING CLINICIAN: True Mar, MD, PhD   REFERRING CLINICIAN: Greig Forbes, NP  CLINICAL INFORMATION/HISTORY (obtained from visit note dated 10/27/2024): 44 year old female with an underlying medical history of multiple sclerosis, chronic migraines, arthritis, reflux disease, history of syncope, PAC, PSVT, PVC, anxiety and severe obesity with a BMI of over 40, who reports snoring and excessive daytime somnolence and a prior diagnosis of mild of borderline OSA.  She has had interim weight gain.   BMI (at the time of sleep clinic visit and/or test date): 43.6 kg/m  FINDINGS:   Study Protocol:    The SANSA single-point-of-skin-contact chest-worn sensor - an FDA cleared and DOT approved type 4 home sleep test device - measures eight physiological channels,  including blood oxygen saturation (measured via PPG [photoplethysmography]), EKG-derived heart rate, respiratory effort, chest movement (measured via accelerometer), snoring, body position, and actigraphy. The device is designed to be worn for up to 10 hours per study.   Sleep Summary:   Total Recording Time (hours, min): 9 hours, 53 min  Total Effective Sleep Time (hours, min):  7 hours, 26 min  Sleep Efficiency (%):    75%   Respiratory Indices:   Calculated sAHI (per hour):  6.3/hour        Calculated sAHIc (central AHI per hour):  0.5/hour  Oxygen Saturation Statistics:    Oxygen Saturation (%) Mean: 92.9%   Minimum oxygen saturation (%):                 83.4%   O2 Saturation Range (%): 83.4-99.1%   Time below or at 88% saturation: 8 min   Pulse Rate Statistics:   Pulse Mean (bpm):    65/min    Pulse Range (55-128/min)   Snoring: Mild to moderate  IMPRESSION/DIAGNOSES:   OSA (obstructive sleep apnea), mild   RECOMMENDATIONS:   This home sleep test demonstrates  overall mild obstructive sleep apnea with a total AHI of 6.3/hour and O2 nadir of 83.4%. Snoring was detected, in the mild to moderate range.  Time below or at 88% saturation was 8 minutes for the night. Given the patient's medical history and sleep related complaints, therapy with a positive airway pressure device is a reasonable first-line choice and clinically recommended. Treatment can be achieved in the form of autoPAP trial/titration at home for now. A full night, in-lab PAP titration study may aid in improving proper treatment settings and with mask fit, if needed, down the road.  Alternative treatments may include weight loss (where appropriate) along with avoidance of the supine sleep position (if possible), or an oral appliance in appropriate candidates.   Please note that untreated obstructive sleep apnea may carry additional perioperative morbidity. Patients with significant obstructive sleep apnea should receive perioperative PAP therapy and the surgeons and particularly the anesthesiologist should be informed of the diagnosis and the severity of the sleep disordered breathing. The patient should be cautioned not to drive, work at heights, or operate dangerous or heavy equipment when tired or sleepy. Review and reiteration of good sleep hygiene measures should be pursued with any patient. Other causes of the patient's symptoms, including circadian rhythm disturbances, an underlying mood disorder, medication effect and/or an underlying medical problem cannot be ruled out based on this test. Clinical correlation is recommended.  The patient and her referring provider will be notified of the test results.  The patient will be seen in follow up in sleep clinic at [facility], as necessary.  I certify that I have reviewed the raw data recording prior to the issuance of this report in accordance with the standards of the American Academy of Sleep Medicine (AASM).    INTERPRETING PHYSICIAN:   True Mar,  MD, PhD Medical Director, [facility]) Diplomat, ABPN (Neurology and Sleep)   [facility] [address], [address] [place], [place] [address] [phone]          "

## 2024-12-01 NOTE — Addendum Note (Signed)
 Addended by: CARY NO L on: 12/01/2024 10:44 AM   Modules accepted: Orders

## 2024-12-02 ENCOUNTER — Encounter: Payer: Self-pay | Admitting: Neurology

## 2024-12-03 MED ORDER — SUMATRIPTAN SUCCINATE 50 MG PO TABS: ORAL_TABLET | ORAL | 11 refills | Status: AC

## 2024-12-22 ENCOUNTER — Ambulatory Visit (HOSPITAL_COMMUNITY)

## 2025-06-16 ENCOUNTER — Ambulatory Visit: Admitting: Neurology
